# Patient Record
Sex: Male | Born: 1940 | Race: White | Hispanic: No | Marital: Married | Smoking: Never smoker
Health system: Southern US, Community
[De-identification: ages and names within clinical notes are randomized; demographics above are authoritative.]

## PROBLEM LIST (undated history)

## (undated) DIAGNOSIS — Z86010 Personal history of colonic polyps: Secondary | ICD-10-CM

## (undated) DIAGNOSIS — R030 Elevated blood-pressure reading, without diagnosis of hypertension: Secondary | ICD-10-CM

## (undated) DIAGNOSIS — R7302 Impaired glucose tolerance (oral): Secondary | ICD-10-CM

## (undated) DIAGNOSIS — F528 Other sexual dysfunction not due to a substance or known physiological condition: Secondary | ICD-10-CM

## (undated) DIAGNOSIS — Z85828 Personal history of other malignant neoplasm of skin: Secondary | ICD-10-CM

## (undated) DIAGNOSIS — Z87898 Personal history of other specified conditions: Secondary | ICD-10-CM

## (undated) DIAGNOSIS — E785 Hyperlipidemia, unspecified: Secondary | ICD-10-CM

## (undated) DIAGNOSIS — J309 Allergic rhinitis, unspecified: Secondary | ICD-10-CM

## (undated) DIAGNOSIS — N400 Enlarged prostate without lower urinary tract symptoms: Secondary | ICD-10-CM

## (undated) DIAGNOSIS — Z9889 Other specified postprocedural states: Secondary | ICD-10-CM

## (undated) DIAGNOSIS — N529 Male erectile dysfunction, unspecified: Secondary | ICD-10-CM

## (undated) DIAGNOSIS — K5730 Diverticulosis of large intestine without perforation or abscess without bleeding: Secondary | ICD-10-CM

## (undated) DIAGNOSIS — M1990 Unspecified osteoarthritis, unspecified site: Secondary | ICD-10-CM

## (undated) DIAGNOSIS — T7840XA Allergy, unspecified, initial encounter: Secondary | ICD-10-CM

## (undated) HISTORY — DX: Personal history of colonic polyps: Z86.010

## (undated) HISTORY — PX: POLYPECTOMY: SHX149

## (undated) HISTORY — PX: OTHER SURGICAL HISTORY: SHX169

## (undated) HISTORY — DX: Impaired glucose tolerance (oral): R73.02

## (undated) HISTORY — PX: BASAL CELL CARCINOMA EXCISION: SHX1214

## (undated) HISTORY — PX: COLONOSCOPY: SHX174

## (undated) HISTORY — DX: Allergic rhinitis, unspecified: J30.9

## (undated) HISTORY — PX: LUMBAR LAMINECTOMY: SHX95

## (undated) HISTORY — PX: SPINE SURGERY: SHX786

## (undated) HISTORY — DX: Hyperlipidemia, unspecified: E78.5

## (undated) HISTORY — PX: HERNIA REPAIR: SHX51

## (undated) HISTORY — DX: Personal history of other specified conditions: Z87.898

## (undated) HISTORY — DX: Allergy, unspecified, initial encounter: T78.40XA

## (undated) HISTORY — PX: BACK SURGERY: SHX140

## (undated) HISTORY — PX: INGUINAL HERNIA REPAIR: SUR1180

## (undated) HISTORY — DX: Other sexual dysfunction not due to a substance or known physiological condition: F52.8

## (undated) HISTORY — DX: Unspecified osteoarthritis, unspecified site: M19.90

---

## 1972-09-21 HISTORY — PX: LUMBAR LAMINECTOMY: SHX95

## 2001-09-21 LAB — HM COLONOSCOPY

## 2004-08-06 ENCOUNTER — Ambulatory Visit: Payer: Self-pay | Admitting: Family Medicine

## 2004-08-12 ENCOUNTER — Ambulatory Visit: Payer: Self-pay | Admitting: Family Medicine

## 2004-08-23 ENCOUNTER — Ambulatory Visit: Payer: Self-pay | Admitting: Family Medicine

## 2005-10-28 ENCOUNTER — Ambulatory Visit: Payer: Self-pay | Admitting: Family Medicine

## 2005-11-05 ENCOUNTER — Ambulatory Visit: Payer: Self-pay | Admitting: Family Medicine

## 2007-01-12 ENCOUNTER — Ambulatory Visit: Payer: Self-pay | Admitting: Family Medicine

## 2007-01-12 LAB — CONVERTED CEMR LAB
ALT: 37 units/L (ref 0–40)
AST: 29 units/L (ref 0–37)
Albumin: 4.1 g/dL (ref 3.5–5.2)
Alkaline Phosphatase: 50 units/L (ref 39–117)
BUN: 10 mg/dL (ref 6–23)
Basophils Absolute: 0 10*3/uL (ref 0.0–0.1)
Basophils Relative: 0.4 % (ref 0.0–1.0)
Bilirubin, Direct: 0.1 mg/dL (ref 0.0–0.3)
CO2: 32 meq/L (ref 19–32)
Calcium: 9.5 mg/dL (ref 8.4–10.5)
Chloride: 106 meq/L (ref 96–112)
Cholesterol: 201 mg/dL (ref 0–200)
Creatinine, Ser: 1 mg/dL (ref 0.4–1.5)
Direct LDL: 117 mg/dL
Eosinophils Absolute: 0.1 10*3/uL (ref 0.0–0.6)
Eosinophils Relative: 2.2 % (ref 0.0–5.0)
GFR calc Af Amer: 97 mL/min
GFR calc non Af Amer: 80 mL/min
Glucose, Bld: 101 mg/dL — ABNORMAL HIGH (ref 70–99)
HCT: 50.3 % (ref 39.0–52.0)
HDL: 43.2 mg/dL (ref >39.0)
Hemoglobin: 16.6 g/dL (ref 13.0–17.0)
Lymphocytes Relative: 31.8 % (ref 12.0–46.0)
MCHC: 33.1 g/dL (ref 30.0–36.0)
MCV: 89.1 fL (ref 78.0–100.0)
Monocytes Absolute: 0.6 10*3/uL (ref 0.2–0.7)
Monocytes Relative: 9.8 % (ref 3.0–11.0)
Neutro Abs: 3.4 10*3/uL (ref 1.4–7.7)
Neutrophils Relative %: 55.8 % (ref 43.0–77.0)
PSA: 1.07 ng/mL (ref 0.10–4.00)
Platelets: 214 10*3/uL (ref 150–400)
Potassium: 4.7 meq/L (ref 3.5–5.1)
RBC: 5.65 M/uL (ref 4.22–5.81)
RDW: 12 % (ref 11.5–14.6)
Sodium: 143 meq/L (ref 135–145)
TSH: 1.21 microintl units/mL (ref 0.35–5.50)
Total Bilirubin: 1.3 mg/dL — ABNORMAL HIGH (ref 0.3–1.2)
Total CHOL/HDL Ratio: 4.7
Total Protein: 6.8 g/dL (ref 6.0–8.3)
Triglycerides: 169 mg/dL — ABNORMAL HIGH (ref 0–149)
VLDL: 34 mg/dL (ref 0–40)
WBC: 6 10*3/uL (ref 4.5–10.5)

## 2007-01-19 ENCOUNTER — Ambulatory Visit: Payer: Self-pay | Admitting: Family Medicine

## 2007-05-31 ENCOUNTER — Ambulatory Visit: Payer: Self-pay | Admitting: Family Medicine

## 2008-01-19 ENCOUNTER — Ambulatory Visit: Payer: Self-pay | Admitting: Family Medicine

## 2008-01-19 LAB — CONVERTED CEMR LAB
Bilirubin Urine: NEGATIVE
Blood in Urine, dipstick: NEGATIVE
Glucose, Urine, Semiquant: NEGATIVE
Ketones, urine, test strip: NEGATIVE
Nitrite: NEGATIVE
Protein, U semiquant: NEGATIVE
Specific Gravity, Urine: 1.015
Urobilinogen, UA: 0.2
WBC Urine, dipstick: NEGATIVE
pH: 5.5

## 2008-01-26 ENCOUNTER — Ambulatory Visit: Payer: Self-pay | Admitting: Family Medicine

## 2008-01-26 DIAGNOSIS — N529 Male erectile dysfunction, unspecified: Secondary | ICD-10-CM | POA: Insufficient documentation

## 2008-01-26 DIAGNOSIS — F528 Other sexual dysfunction not due to a substance or known physiological condition: Secondary | ICD-10-CM | POA: Insufficient documentation

## 2008-01-26 LAB — CONVERTED CEMR LAB
ALT: 35 units/L (ref 0–53)
AST: 31 units/L (ref 0–37)
Albumin: 4.2 g/dL (ref 3.5–5.2)
Alkaline Phosphatase: 56 units/L (ref 39–117)
BUN: 13 mg/dL (ref 6–23)
Basophils Absolute: 0 10*3/uL (ref 0.0–0.1)
Basophils Relative: 0.2 % (ref 0.0–1.0)
Bilirubin, Direct: 0.1 mg/dL (ref 0.0–0.3)
CO2: 31 meq/L (ref 19–32)
Calcium: 9 mg/dL (ref 8.4–10.5)
Chloride: 101 meq/L (ref 96–112)
Cholesterol: 182 mg/dL (ref 0–200)
Creatinine, Ser: 1.2 mg/dL (ref 0.4–1.5)
Eosinophils Absolute: 0.2 10*3/uL (ref 0.0–0.7)
Eosinophils Relative: 2.7 % (ref 0.0–5.0)
GFR calc Af Amer: 78 mL/min
GFR calc non Af Amer: 65 mL/min
Glucose, Bld: 103 mg/dL — ABNORMAL HIGH (ref 70–99)
HCT: 49.5 % (ref 39.0–52.0)
HDL: 34.9 mg/dL — ABNORMAL LOW (ref >39.0)
Hemoglobin: 16.4 g/dL (ref 13.0–17.0)
LDL Cholesterol: 127 mg/dL — ABNORMAL HIGH (ref 0–99)
Lymphocytes Relative: 36.4 % (ref 12.0–46.0)
MCHC: 33.2 g/dL (ref 30.0–36.0)
MCV: 89.8 fL (ref 78.0–100.0)
Monocytes Absolute: 0.7 10*3/uL (ref 0.1–1.0)
Monocytes Relative: 11.7 % (ref 3.0–12.0)
Neutro Abs: 3.2 10*3/uL (ref 1.4–7.7)
Neutrophils Relative %: 49 % (ref 43.0–77.0)
PSA: 1.04 ng/mL (ref 0.10–4.00)
Platelets: 194 10*3/uL (ref 150–400)
Potassium: 4 meq/L (ref 3.5–5.1)
RBC: 5.52 M/uL (ref 4.22–5.81)
RDW: 11.9 % (ref 11.5–14.6)
Sodium: 138 meq/L (ref 135–145)
TSH: 1.71 microintl units/mL (ref 0.35–5.50)
Total Bilirubin: 1.3 mg/dL — ABNORMAL HIGH (ref 0.3–1.2)
Total CHOL/HDL Ratio: 5.2
Total Protein: 7 g/dL (ref 6.0–8.3)
Triglycerides: 100 mg/dL (ref 0–149)
VLDL: 20 mg/dL (ref 0–40)
WBC: 6.4 10*3/uL (ref 4.5–10.5)

## 2008-01-26 LAB — HM COLONOSCOPY

## 2008-10-15 ENCOUNTER — Ambulatory Visit: Payer: Self-pay | Admitting: Family Medicine

## 2008-10-15 DIAGNOSIS — K4090 Unilateral inguinal hernia, without obstruction or gangrene, not specified as recurrent: Secondary | ICD-10-CM | POA: Insufficient documentation

## 2008-10-30 ENCOUNTER — Encounter: Payer: Self-pay | Admitting: Family Medicine

## 2008-11-19 ENCOUNTER — Encounter: Admission: RE | Admit: 2008-11-19 | Discharge: 2008-11-19 | Payer: Self-pay | Admitting: General Surgery

## 2008-11-21 ENCOUNTER — Ambulatory Visit (HOSPITAL_BASED_OUTPATIENT_CLINIC_OR_DEPARTMENT_OTHER): Admission: RE | Admit: 2008-11-21 | Discharge: 2008-11-21 | Payer: Self-pay | Admitting: General Surgery

## 2008-12-04 ENCOUNTER — Encounter: Payer: Self-pay | Admitting: Family Medicine

## 2009-01-23 ENCOUNTER — Ambulatory Visit: Payer: Self-pay | Admitting: Family Medicine

## 2009-01-23 LAB — CONVERTED CEMR LAB
Blood in Urine, dipstick: NEGATIVE
Glucose, Urine, Semiquant: NEGATIVE
Ketones, urine, test strip: NEGATIVE
Nitrite: NEGATIVE
Specific Gravity, Urine: >=1.03
Urobilinogen, UA: 0.2
WBC Urine, dipstick: NEGATIVE
pH: 5

## 2009-01-29 ENCOUNTER — Ambulatory Visit: Payer: Self-pay | Admitting: Family Medicine

## 2009-01-29 LAB — CONVERTED CEMR LAB
ALT: 29 units/L (ref 0–53)
AST: 24 units/L (ref 0–37)
Albumin: 4 g/dL (ref 3.5–5.2)
Alkaline Phosphatase: 61 units/L (ref 39–117)
BUN: 15 mg/dL (ref 6–23)
Basophils Absolute: 0 10*3/uL (ref 0.0–0.1)
Basophils Relative: 0 % (ref 0.0–3.0)
Bilirubin, Direct: 0.1 mg/dL (ref 0.0–0.3)
CO2: 28 meq/L (ref 19–32)
Calcium: 9 mg/dL (ref 8.4–10.5)
Chloride: 111 meq/L (ref 96–112)
Cholesterol: 164 mg/dL (ref 0–200)
Creatinine, Ser: 1.1 mg/dL (ref 0.4–1.5)
Eosinophils Absolute: 0.1 10*3/uL (ref 0.0–0.7)
Eosinophils Relative: 1.6 % (ref 0.0–5.0)
GFR calc non Af Amer: 71.29 mL/min (ref >60)
Glucose, Bld: 97 mg/dL (ref 70–99)
HCT: 47.3 % (ref 39.0–52.0)
HDL: 39.7 mg/dL (ref >39.00)
Hemoglobin: 16.5 g/dL (ref 13.0–17.0)
LDL Cholesterol: 105 mg/dL — ABNORMAL HIGH (ref 0–99)
Lymphocytes Relative: 28.4 % (ref 12.0–46.0)
Lymphs Abs: 1.9 10*3/uL (ref 0.7–4.0)
MCHC: 34.8 g/dL (ref 30.0–36.0)
MCV: 89.4 fL (ref 78.0–100.0)
Monocytes Absolute: 0.7 10*3/uL (ref 0.1–1.0)
Monocytes Relative: 9.9 % (ref 3.0–12.0)
Neutro Abs: 4 10*3/uL (ref 1.4–7.7)
Neutrophils Relative %: 60.1 % (ref 43.0–77.0)
PSA: 1.15 ng/mL (ref 0.10–4.00)
Platelets: 160 10*3/uL (ref 150.0–400.0)
Potassium: 3.7 meq/L (ref 3.5–5.1)
RBC: 5.29 M/uL (ref 4.22–5.81)
RDW: 11.7 % (ref 11.5–14.6)
Sodium: 144 meq/L (ref 135–145)
TSH: 1.6 microintl units/mL (ref 0.35–5.50)
Total Bilirubin: 1.2 mg/dL (ref 0.3–1.2)
Total CHOL/HDL Ratio: 4
Total Protein: 6.9 g/dL (ref 6.0–8.3)
Triglycerides: 98 mg/dL (ref 0.0–149.0)
VLDL: 19.6 mg/dL (ref 0.0–40.0)
WBC: 6.7 10*3/uL (ref 4.5–10.5)

## 2009-08-27 ENCOUNTER — Encounter (INDEPENDENT_AMBULATORY_CARE_PROVIDER_SITE_OTHER): Payer: Self-pay | Admitting: *Deleted

## 2009-09-04 ENCOUNTER — Encounter: Payer: Self-pay | Admitting: Family Medicine

## 2010-01-30 ENCOUNTER — Ambulatory Visit: Payer: Self-pay | Admitting: Family Medicine

## 2010-01-30 DIAGNOSIS — E785 Hyperlipidemia, unspecified: Secondary | ICD-10-CM | POA: Insufficient documentation

## 2010-01-30 DIAGNOSIS — E039 Hypothyroidism, unspecified: Secondary | ICD-10-CM | POA: Insufficient documentation

## 2010-01-30 DIAGNOSIS — Z87898 Personal history of other specified conditions: Secondary | ICD-10-CM | POA: Insufficient documentation

## 2010-01-30 DIAGNOSIS — T50995A Adverse effect of other drugs, medicaments and biological substances, initial encounter: Secondary | ICD-10-CM | POA: Insufficient documentation

## 2010-01-30 DIAGNOSIS — R350 Frequency of micturition: Secondary | ICD-10-CM | POA: Insufficient documentation

## 2010-01-30 DIAGNOSIS — E559 Vitamin D deficiency, unspecified: Secondary | ICD-10-CM | POA: Insufficient documentation

## 2010-01-30 DIAGNOSIS — D649 Anemia, unspecified: Secondary | ICD-10-CM | POA: Insufficient documentation

## 2010-01-30 DIAGNOSIS — D239 Other benign neoplasm of skin, unspecified: Secondary | ICD-10-CM | POA: Insufficient documentation

## 2010-01-30 LAB — CONVERTED CEMR LAB
Bilirubin Urine: NEGATIVE
Blood in Urine, dipstick: NEGATIVE
Glucose, Urine, Semiquant: NEGATIVE
Ketones, urine, test strip: NEGATIVE
Nitrite: NEGATIVE
Protein, U semiquant: NEGATIVE
Specific Gravity, Urine: 1.025
Urobilinogen, UA: 0.2
WBC Urine, dipstick: NEGATIVE
pH: 5

## 2010-02-14 LAB — CONVERTED CEMR LAB
ALT: 39 units/L (ref 0–53)
AST: 29 units/L (ref 0–37)
Albumin: 4.2 g/dL (ref 3.5–5.2)
Alkaline Phosphatase: 56 units/L (ref 39–117)
BUN: 11 mg/dL (ref 6–23)
Basophils Absolute: 0 10*3/uL (ref 0.0–0.1)
Basophils Relative: 0.3 % (ref 0.0–3.0)
Bilirubin, Direct: 0.2 mg/dL (ref 0.0–0.3)
CO2: 30 meq/L (ref 19–32)
Calcium: 9.2 mg/dL (ref 8.4–10.5)
Chloride: 105 meq/L (ref 96–112)
Cholesterol: 161 mg/dL (ref 0–200)
Creatinine, Ser: 1.1 mg/dL (ref 0.4–1.5)
Eosinophils Absolute: 0.1 10*3/uL (ref 0.0–0.7)
Eosinophils Relative: 2.1 % (ref 0.0–5.0)
GFR calc non Af Amer: 73.37 mL/min (ref >60)
Glucose, Bld: 95 mg/dL (ref 70–99)
HCT: 45.7 % (ref 39.0–52.0)
HDL: 44.6 mg/dL (ref >39.00)
Hemoglobin: 16 g/dL (ref 13.0–17.0)
LDL Cholesterol: 95 mg/dL (ref 0–99)
Lymphocytes Relative: 27.6 % (ref 12.0–46.0)
Lymphs Abs: 1.5 10*3/uL (ref 0.7–4.0)
MCHC: 35 g/dL (ref 30.0–36.0)
MCV: 88.8 fL (ref 78.0–100.0)
Monocytes Absolute: 0.6 10*3/uL (ref 0.1–1.0)
Monocytes Relative: 10.8 % (ref 3.0–12.0)
Neutro Abs: 3.3 10*3/uL (ref 1.4–7.7)
Neutrophils Relative %: 59.2 % (ref 43.0–77.0)
PSA: 1.02 ng/mL (ref 0.10–4.00)
Platelets: 182 10*3/uL (ref 150.0–400.0)
Potassium: 4.7 meq/L (ref 3.5–5.1)
RBC: 5.14 M/uL (ref 4.22–5.81)
RDW: 13 % (ref 11.5–14.6)
Sodium: 142 meq/L (ref 135–145)
TSH: 1.28 microintl units/mL (ref 0.35–5.50)
Total Bilirubin: 0.8 mg/dL (ref 0.3–1.2)
Total CHOL/HDL Ratio: 4
Total Protein: 6.7 g/dL (ref 6.0–8.3)
Triglycerides: 105 mg/dL (ref 0.0–149.0)
VLDL: 21 mg/dL (ref 0.0–40.0)
Vit D, 25-Hydroxy: 34 ng/mL (ref 30–89)
WBC: 5.6 10*3/uL (ref 4.5–10.5)

## 2010-02-18 ENCOUNTER — Encounter: Payer: Self-pay | Admitting: Family Medicine

## 2010-10-21 NOTE — Assessment & Plan Note (Signed)
Summary: cpx/jls   Vital Signs:  Patient Profile:   70 Years Old Male Weight:      201 pounds Temp:     98 degrees F Pulse rate:   60 / minute BP sitting:   120 / 82  (left arm) Cuff size:   regular  Vitals Entered By: Pura Spice, RN (Jan 26, 2008 10:51 AM)                 Chief Complaint:  yrly ck up  .  History of Present Illness: Pt in for yearly physical examination Continues to need viagra no other complaints colonoscopic exam due 2013     Current Allergies (reviewed today): ! PCN    Risk Factors:  Colonoscopy History:     Date of Last Colonoscopy:  05/16/2002    Results:  Normal   Colonoscopy History:     Date of Last Colonoscopy:  05/16/2002    Results:  normal    Review of Systems      See HPI  General      Denies chills, fatigue, fever, loss of appetite, malaise, sleep disorder, sweats, weakness, and weight loss.  Eyes      Denies blurring, discharge, double vision, eye irritation, eye pain, halos, itching, light sensitivity, red eye, vision loss-1 eye, and vision loss-both eyes.  ENT      Denies decreased hearing, difficulty swallowing, ear discharge, earache, hoarseness, nasal congestion, nosebleeds, postnasal drainage, ringing in ears, sinus pressure, and sore throat.  CV      Denies bluish discoloration of lips or nails, chest pain or discomfort, difficulty breathing at night, difficulty breathing while lying down, fainting, fatigue, leg cramps with exertion, lightheadness, near fainting, palpitations, shortness of breath with exertion, swelling of feet, swelling of hands, and weight gain.  Resp      Denies chest discomfort, chest pain with inspiration, cough, coughing up blood, excessive snoring, hypersomnolence, morning headaches, pleuritic, shortness of breath, sputum productive, and wheezing.  GI      Denies abdominal pain, bloody stools, change in bowel habits, constipation, dark tarry stools, diarrhea, excessive appetite, gas,  hemorrhoids, indigestion, loss of appetite, nausea, vomiting, vomiting blood, and yellowish skin color.  GU      Complains of erectile dysfunction.      Denies decreased libido, discharge, dysuria, genital sores, hematuria, incontinence, nocturia, urinary frequency, and urinary hesitancy.  MS      Denies joint pain, joint redness, joint swelling, loss of strength, low back pain, mid back pain, muscle aches, muscle , cramps, muscle weakness, stiffness, and thoracic pain.  Derm      Denies changes in color of skin, changes in nail beds, dryness, excessive perspiration, flushing, hair loss, insect bite(s), itching, lesion(s), poor wound healing, and rash.  Neuro      Denies brief paralysis, difficulty with concentration, disturbances in coordination, falling down, headaches, inability to speak, memory loss, numbness, poor balance, seizures, sensation of room spinning, tingling, tremors, visual disturbances, and weakness.  Psych      Denies alternate hallucination ( auditory/visual), anxiety, depression, easily angered, easily tearful, irritability, mental problems, panic attacks, sense of great danger, suicidal thoughts/plans, thoughts of violence, unusual visions or sounds, and thoughts /plans of harming others.  Endo      Denies cold intolerance, excessive hunger, excessive thirst, excessive urination, heat intolerance, polyuria, and weight change.  Heme      Denies abnormal bruising, bleeding, enlarge lymph nodes, fevers, pallor, and skin discoloration.  Allergy  Denies hives or rash, itching eyes, persistent infections, seasonal allergies, and sneezing.  ENT      Denies decreased hearing, difficulty swallowing, ear discharge, earache, hoarseness, nasal congestion, nosebleeds, postnasal drainage, ringing in ears, sinus pressure, and sore throat.   Physical Exam  General:     Well-developed,well-nourished,in no acute distress; alert,appropriate and cooperative throughout  examination Head:     Normocephalic and atraumatic without obvious abnormalities. No apparent alopecia or balding. Eyes:     No corneal or conjunctival inflammation noted. EOMI. Perrla. Funduscopic exam benign, without hemorrhages, exudates or papilledema. Vision grossly normal. Ears:     External ear exam shows no significant lesions or deformities.  Otoscopic examination reveals clear canals, tympanic membranes are intact bilaterally without bulging, retraction, inflammation or discharge. Hearing is grossly normal bilaterally. Nose:     External nasal examination shows no deformity or inflammation. Nasal mucosa are pink and moist without lesions or exudates. Mouth:     Oral mucosa and oropharynx without lesions or exudates.  Teeth in good repair. Neck:     No deformities, masses, or tenderness noted. Chest Wall:     No deformities, masses, tenderness or gynecomastia noted. Breasts:     No masses or gynecomastia noted Lungs:     Normal respiratory effort, chest expands symmetrically. Lungs are clear to auscultation, no crackles or wheezes. Heart:     Normal rate and regular rhythm. S1 and S2 normal without gallop, murmur, click, rub or other extra sounds. EKG normal Abdomen:     Bowel sounds positive,abdomen soft and non-tender without masses, organomegaly or hernias noted. Rectal:     No external abnormalities noted. Normal sphincter tone. No rectal masses or tenderness. Genitalia:     Testes bilaterally descended without nodularity, tenderness or masses. No scrotal masses or lesions. No penis lesions or urethral discharge. Prostate:     Prostate gland firm and smooth, no enlargement, nodularity, tenderness, mass, asymmetry or induration. Msk:     No deformity or scoliosis noted of thoracic or lumbar spine.   Pulses:     R and L carotid,radial,femoral,dorsalis pedis and posterior tibial pulses are full and equal bilaterally Extremities:     No clubbing, cyanosis, edema, or  deformity noted with normal full range of motion of all joints.   Neurologic:     No cranial nerve deficits noted. Station and gait are normal. Plantar reflexes are down-going bilaterally. DTRs are symmetrical throughout. Sensory, motor and coordinative functions appear intact. Skin:      1 small nevus anterior chest, appears nnon malignant , smaller than size of an eraser Cervical Nodes:     No lymphadenopathy noted Axillary Nodes:     No palpable lymphadenopathy Inguinal Nodes:     No significant adenopathy Psych:     Cognition and judgment appear intact. Alert and cooperative with normal attention span and concentration. No apparent delusions, illusions, hallucinations    Impression & Recommendations:  Problem # 1:  PHYSICAL EXAMINATION (ICD-V70.0)  Orders: EKG w/ Interpretation (93000)   Complete Medication List: 1)  Adult Aspirin Low Strength 81 Mg Tbdp (Aspirin) 2)  Viagra 100 Mg Tabs (Sildenafil citrate) .Marland Kitchen.. 1 tab as instructed 3)  Ketoconazole 2 % Crea (Ketoconazole) .... Apply three times a day as needed rash  Colorectal Screening:  Colonoscopy Results:    Date of Exam: 05/16/2002    Results: Normal  Next Colonoscopy Due:    05/16/2012  PSA Screening:    PSA: 1.07  (01/12/2007)  Immunization & Chemoprophylaxis:  Tetanus vaccine: Historical  (12/21/2006)   Patient Instructions: 1)  doing fine normal examination, EKG normal 2)  continue viagra as needed   Prescriptions: VIAGRA 100 MG  TABS (SILDENAFIL CITRATE) 1 tab as instructed  #6 x 11   Entered and Authorized by:   Judithann Sheen MD   Signed by:   Judithann Sheen MD on 01/26/2008   Method used:   Electronically sent to ...       [facility]       [address]       [place], [place]  [address]       Ph: [phone]       Fax: [phone]   RxID:   [id number]  ]    Preventive Care Screening  Colonoscopy:    Date:  05/16/2002     Results:  normal

## 2010-10-21 NOTE — Assessment & Plan Note (Signed)
Summary: RASH/OK PER DR/CCM   Vital Signs:  Patient Profile:   70 Years Old Male Weight:      196 pounds (89.09 kg) Temp:     98.5 degrees F (36.94 degrees C) oral Pulse rate:   76 / minute Pulse rhythm:   regular BP sitting:   104 / 70  (left arm) Cuff size:   regular  Vitals Entered By: Alfred Levins, CMA (May 31, 2007 3:55 PM)                 Chief Complaint:  rash around neck.  History of Present Illness: 6 months of itchy rash around his neck. This summer has gotten worse and made his skin color splotchy.  Current Allergies: ! PCN      Physical Exam  General:     Well-developed,well-nourished,in no acute distress; alert,appropriate and cooperative throughout examination Skin:     his neck and upper chest and back have macular patches of hypopigmented skin    Impression & Recommendations:  Problem # 1:  TINEA VERSICOLOR (ICD-111.0)  His updated medication list for this problem includes:    Ketoconazole 2 % Crea (Ketoconazole) .Marland Kitchen... Apply three times a day as needed rash   Complete Medication List: 1)  Adult Aspirin Low Strength 81 Mg Tbdp (Aspirin) 2)  Viagra 100 Mg Tabs (Sildenafil citrate) 3)  Ketoconazole 2 % Crea (Ketoconazole) .... Apply three times a day as needed rash   Patient Instructions: 1)  Please schedule a follow-up appointment as needed.    Prescriptions: KETOCONAZOLE 2 %  CREA (KETOCONAZOLE) apply three times a day as needed rash  #60 grams x 5   Entered and Authorized by:   Nelwyn Salisbury MD   Signed by:   Nelwyn Salisbury MD on 05/31/2007   Method used:   Electronically sent to ...       [facility]       [address]       [place], [place]  [address]       Ph: [phone]       Fax: [phone]   RxID:   [id number]  ]

## 2010-10-21 NOTE — Assessment & Plan Note (Signed)
Summary: pt will come in fasting/njr   Vital Signs:  Patient profile:   70 year old male Height:      70 inches Weight:      204 pounds BMI:     29.38 O2 Sat:      96 % Temp:     97.6 degrees F Pulse rate:   74 / minute BP sitting:   124 / 80  (left arm)  Vitals Entered By: Pura Spice, RN (Jan 30, 2010 8:28 AM) CC: refills go over problems fasting    History of Present Illness: This 70 year old white married male is in to discuss his medical problems and refill on necessary medications He continues to have problem with erectile dysfunction total increase from 50-100 mg tablets Since last years has some urinary frequency no dysuria and only nocturia 1-2 times He desired me to check a lesion on his left ankle  the fact that he needs a colonoscopic examination in 2012 no problems since his hernia was repaired had a lumbar laminectomy in the past and had no problem since  Allergies: 1)  ! Pcn  Past History:  Past Medical History: Last updated: 05/31/2007 Unremarkable  Social History: Last updated: 05/31/2007 Retired Married Never Smoked  Risk Factors: Smoking Status: never (05/31/2007)  Past Surgical History: Back Surgery Colonoscopy herniorrhaphy right  Review of Systems      See HPI  The patient denies anorexia, fever, weight loss, weight gain, vision loss, decreased hearing, hoarseness, chest pain, syncope, dyspnea on exertion, peripheral edema, prolonged cough, headaches, hemoptysis, abdominal pain, melena, hematochezia, severe indigestion/heartburn, hematuria, incontinence, genital sores, muscle weakness, suspicious skin lesions, transient blindness, difficulty walking, depression, unusual weight change, abnormal bleeding, enlarged lymph nodes, angioedema, breast masses, and testicular masses.    Physical Exam  General:  Well-developed,well-nourished,in no acute distress; alert,appropriate and cooperative throughout examination Eyes:  No corneal or  conjunctival inflammation noted. EOMI. Perrla. Funduscopic exam benign, without hemorrhages, exudates or papilledema. Vision grossly normal. Ears:  External ear exam shows no significant lesions or deformities.  Otoscopic examination reveals clear canals, tympanic membranes are intact bilaterally without bulging, retraction, inflammation or discharge. Hearing is grossly normal bilaterally. Nose:  External nasal examination shows no deformity or inflammation. Nasal mucosa are pink and moist without lesions or exudates. Mouth:  Oral mucosa and oropharynx without lesions or exudates.  Teeth in good repair. Neck:  No deformities, masses, or tenderness noted. Chest Wall:  No deformities, masses, tenderness or gynecomastia noted. Breasts:  No masses or gynecomastia noted Lungs:  Normal respiratory effort, chest expands symmetrically. Lungs are clear to auscultation, no crackles or wheezes. Heart:  Normal rate and regular rhythm. S1 and S2 normal without gallop, murmur, click, rub or other extra sounds. Abdomen:  Bowel sounds positive,abdomen soft and non-tender without masses, organomegaly or hernias noted. Rectal:  No external abnormalities noted. Normal sphincter tone. No rectal masses or tenderness. Genitalia:  Testes bilaterally descended without nodularity, tenderness or masses. No scrotal masses or lesions. No penis lesions or urethral discharge. Prostate:  no nodules, no asymmetry, no induration, and 1+ enlarged.   Msk:  No deformity or scoliosis noted of thoracic or lumbar spine.   Pulses:  R and L carotid,radial,femoral,dorsalis pedis and posterior tibial pulses are full and equal bilaterally Extremities:  No clubbing, cyanosis, edema, or deformity noted with normal full range of motion of all joints.   Neurologic:  No cranial nerve deficits noted. Station and gait are normal. Plantar reflexes are down-going bilaterally.  DTRs are symmetrical throughout. Sensory, motor and coordinative functions  appear intact. Skin:  nevus left ankle Cervical Nodes:  No lymphadenopathy noted Axillary Nodes:  No palpable lymphadenopathy Inguinal Nodes:  No significant adenopathy Psych:  Cognition and judgment appear intact. Alert and cooperative with normal attention span and concentration. No apparent delusions, illusions, hallucinations   Impression & Recommendations:  Problem # 1:  BENIGN PROSTATIC HYPERTROPHY, MILD, HX OF (ICD-V13.8) Assessment New  Problem # 2:  NEVUS (ICD-216.9) Assessment: New  Orders: Dermatology Referral (Derma)  Problem # 3:  URINARY FREQUENCY (ICD-788.41) Assessment: New  Orders: UA Dipstick w/o Micro (automated)  (81003)urinalysis negative symptoms of mild BPH  Problem # 4:  INGUINAL HERNIA (ICD-550.90) Assessment: Improved  Problem # 5:  ERECTILE DYSFUNCTION (ICD-302.72) Assessment: Unchanged  His updated medication list for this problem includes:    Viagra 100 Mg Tabs (Sildenafil citrate) .Marland Kitchen... 1 tab as instructed  Orders: Prescription Created Electronically [phone])  Complete Medication List: 1)  Viagra 100 Mg Tabs (Sildenafil citrate) .Marland Kitchen.. 1 tab as instructed 2)  Aspir-low 81 Mg Tbec (Aspirin) .Marland Kitchen.. 1 qd  Other Orders: Pneumococcal Vaccine (60454) Admin 1st Vaccine (09811) Venipuncture (91478) TLB-Lipid Panel (80061-LIPID) TLB-BMP (Basic Metabolic Panel-BMET) (80048-METABOL) TLB-CBC Platelet - w/Differential (85025-CBCD) TLB-Hepatic/Liver Function Pnl (80076-HEPATIC) TLB-TSH (Thyroid Stimulating Hormone) (84443-TSH) TLB-PSA (Prostate Specific Antigen) (84153-PSA) T-Vitamin D (25-Hydroxy) (29562-13086)  Patient Instructions: 1)  in general physical examination is very good 2)  We'll call results of lab studies 3)  Continue Viagra 100 mg for erectile dysfunction 4)  Referral to Dr. reluctant for evaluation and treatment of the lesion on the left ankle Prescriptions: VIAGRA 100 MG  TABS (SILDENAFIL CITRATE) 1 tab as instructed  #6 x 11    Entered and Authorized by:   Judithann Sheen MD   Signed by:   Judithann Sheen MD on 01/30/2010   Method used:   Electronically to        [facility] [address] (retail)       [address]       [place], [place]  [address]       Ph: [phone]       Fax: [phone]   RxID:   [id number]    Immunizations Administered:  Pneumonia Vaccine:    Vaccine Type: Pneumovax    Site: left deltoid    Mfr: Merck    Dose: 0.5 ml    Route: IM    Given by: Pura Spice, RN    Exp. Date: 04/26/2011    Lot #: [id number]      Laboratory Results   Urine Tests    Routine Urinalysis   Color: yellow Appearance: Clear Glucose: negative   (Normal Range: Negative) Bilirubin: negative   (Normal Range: Negative) Ketone: negative   (Normal Range: Negative) Spec. Gravity: 1.025   (Normal Range: 1.003-1.035) Blood: negative   (Normal Range: Negative) pH: 5.0   (Normal Range: 5.0-8.0) Protein: negative   (Normal Range: Negative) Urobilinogen: 0.2   (Normal Range: 0-1) Nitrite: negative   (Normal Range: Negative) Leukocyte Esterace: negative   (Normal Range: Negative)    Comments: Rita Ohara  Jan 30, 2010 10:17 AM

## 2010-10-21 NOTE — Miscellaneous (Signed)
Summary: ZOSTIVAX shingles vaccine  ZOSTIVAX shingles vaccine Provider: This provider was preselected by the workflow.  Signature: The signature status of this document was preset by the workflow  Processed by [employer] Local Indexer Client @ Tuesday, September 10, 2009 8:38:44 AM using version:2010.1.2.11(2.4)   Manually Indexed By: [id number]  idlBatchDetail: [id number]   _____________________________________________________________________  External Attachment:    Type:   Image     Comment:   External Document

## 2010-10-21 NOTE — Consult Note (Signed)
Summary: [facility]  [facility]   Imported By: Maryln Gottron 05/20/2010 12:35:31  _____________________________________________________________________  External Attachment:    Type:   Image     Comment:   External Document

## 2010-10-24 NOTE — Letter (Signed)
Summary: [facility]   Imported By: Maryln Gottron 11/15/2008 12:56:39  _____________________________________________________________________  External Attachment:    Type:   Image     Comment:   External Document

## 2011-01-01 LAB — CBC
HCT: 45.6 % (ref 39.0–52.0)
Hemoglobin: 16 g/dL (ref 13.0–17.0)
MCHC: 35 g/dL (ref 30.0–36.0)
MCV: 87.6 fL (ref 78.0–100.0)
Platelets: 183 10*3/uL (ref 150–400)
RBC: 5.21 MIL/uL (ref 4.22–5.81)
RDW: 12.8 % (ref 11.5–15.5)
WBC: 5.5 10*3/uL (ref 4.0–10.5)

## 2011-01-01 LAB — COMPREHENSIVE METABOLIC PANEL
ALT: 38 U/L (ref 0–53)
AST: 35 U/L (ref 0–37)
Albumin: 3.8 g/dL (ref 3.5–5.2)
Alkaline Phosphatase: 52 U/L (ref 39–117)
BUN: 11 mg/dL (ref 6–23)
CO2: 27 mEq/L (ref 19–32)
Calcium: 9 mg/dL (ref 8.4–10.5)
Chloride: 108 mEq/L (ref 96–112)
Creatinine, Ser: 1.1 mg/dL (ref 0.4–1.5)
GFR calc Af Amer: >60 mL/min (ref >60)
GFR calc non Af Amer: >60 mL/min (ref >60)
Glucose, Bld: 102 mg/dL — ABNORMAL HIGH (ref 70–99)
Potassium: 4.2 mEq/L (ref 3.5–5.1)
Sodium: 140 mEq/L (ref 135–145)
Total Bilirubin: 0.9 mg/dL (ref 0.3–1.2)
Total Protein: 6.5 g/dL (ref 6.0–8.3)

## 2011-01-01 LAB — DIFFERENTIAL
Basophils Absolute: 0 10*3/uL (ref 0.0–0.1)
Basophils Relative: 0 % (ref 0–1)
Eosinophils Absolute: 0.1 10*3/uL (ref 0.0–0.7)
Eosinophils Relative: 2 % (ref 0–5)
Lymphocytes Relative: 32 % (ref 12–46)
Lymphs Abs: 1.8 10*3/uL (ref 0.7–4.0)
Monocytes Absolute: 0.6 10*3/uL (ref 0.1–1.0)
Monocytes Relative: 11 % (ref 3–12)
Neutro Abs: 3 10*3/uL (ref 1.7–7.7)
Neutrophils Relative %: 55 % (ref 43–77)

## 2011-02-03 NOTE — Op Note (Signed)
Charles Lawson, Charles Lawson NO.:  [id number]   MEDICAL RECORD NO.:  [id number]          PATIENT TYPE:  AMB   LOCATION:  DSC                          FACILITY:  [facility]   PHYSICIAN:  Juanetta Gosling, MDDATE OF BIRTH:  [birth year]   DATE OF PROCEDURE:  11/21/2008  DATE OF DISCHARGE:                               OPERATIVE REPORT   PREOPERATIVE DIAGNOSIS:  Right inguinal hernia.   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   PROCEDURE:  Right inguinal repair with extended Prolene hernia system  mesh.   SURGEON:  Troy Sine. Dwain Sarna, MD   ASSISTANT:  None.   ANESTHESIA:  General.   FINDINGS:  Indirect right inguinal hernia with cord lipoma.   SPECIMENS:  None.   DRAINS:  None.   COMPLICATIONS:  None.   ESTIMATED BLOOD LOSS:  Minimal.   DISPOSITION:  To recovery room in stable condition.   INDICATIONS:  Charles Lawson is a 70 year old male with about 20-month history  of right groin pain as well as a bulge who describes no other  significant complaints.  Since this has begun, this is that increasingly  tender and difficult for him to perform his daily activities.   PHYSICAL EXAMINATION:  He had a reducible nontender right inguinal  hernia with no left inguinal hernia present.  I counseled him for an  open right inguinal hernia repair with mesh.   PROCEDURE:  After informed consent was obtained, the patient was taken  to the operating room.  He was administered 400 mg of IV for  ciprofloxacin due to his PENICILLIN allergy.  He was then placed under  general anesthesia without complications.  He had previously shaved his  groin, but otherwise he was prepped and draped in a standard sterile  surgical fashion.  Surgical time out was then performed.   A 4-cm right groin incision was made and dissection was carried out down  to the level of external abdominal oblique.  Superficial epigastric vein  was identified, this was ligated  with 2-0 Vicryl ties and divided.  The  external abdominal oblique was then identified and incised.  The  Metzenbaum scissors were then used to extend this incision through the  external inguinal ring.  His cord was then encircled with a Penrose  drain.  He was noted to have no evidence of a direct hernia.  He did  have a fairly sizable cord lipoma, which was dissected free from the  surrounding cord structures. The vas deferens, testicular vessels, and  the remainder of the cord structures were identified and preserved  throughout the operation.  The cord lipoma was then tracked to the  internal ring.  This was then clamped , ligated witha 2-0 Vicryl tie and  divided. Marland Kitchen  He also had a fairly large hernia sac present.  This too was  freed from the surrounding cord structures and this was placed back in  the abdomen.  He had a fairly large indirect defect; and due to this, I  elected to place a Prolene hernia system.  The preperitoneal space was  developed with a sponge in the appropriate fashion and extended Prolene  hernia system was then inserted into this, the bottom portion of bilayer  was laid flat underneath the floor of his inguinal canal.  Following  this, the top portion of the bilayer was then laid flat.  This was then  sutured into position near the pubic tubercle superiorly to the internal  oblique into the inguinal ligament inferiorly, a T cut was made in the  mesh and wrapped around the spermatic cord.  The mesh was then tacked  together again as well as to the inguinal ligament in 2 positions and  then laid flat under the external abdominal oblique.  The mesh appeared  to lie flat upon completion of this and was in good position.  The  Penrose drain was then removed.  Hemostasis was observed.  The external  abdominal oblique was then closed with a 2-0 Vicryl suture.  The  Scarpa's fascia was closed with a 3-0 Vicryl suture.  The skin was then  closed with a 4-0 Monocryl  suture in a subcuticular fashion.  A 10 mL of  0.25% Marcaine was infiltrated into the wound as well as performing an  ilioinguinal nerve block.  Dermabond was then placed over the wound.  Testicle was present in his scrotum at the completion of the operation.  He tolerated this well, was extubated in the operating room and  transferred to recovery room in stable condition.      Juanetta Gosling, MD  Electronically Signed     MCW/MEDQ  D:  11/21/2008  T:  11/21/2008  Job:  [id number]   cc:   Tera Mater. Clent Ridges, MD

## 2011-02-26 ENCOUNTER — Other Ambulatory Visit (INDEPENDENT_AMBULATORY_CARE_PROVIDER_SITE_OTHER): Payer: Medicare Other

## 2011-02-26 DIAGNOSIS — Z0000 Encounter for general adult medical examination without abnormal findings: Secondary | ICD-10-CM

## 2011-02-26 DIAGNOSIS — Z0389 Encounter for observation for other suspected diseases and conditions ruled out: Secondary | ICD-10-CM

## 2011-02-26 LAB — BASIC METABOLIC PANEL
BUN: 16 mg/dL (ref 6–23)
CO2: 27 mEq/L (ref 19–32)
Calcium: 8.8 mg/dL (ref 8.4–10.5)
Chloride: 101 mEq/L (ref 96–112)
Creatinine, Ser: 1.2 mg/dL (ref 0.4–1.5)
GFR: 65.97 mL/min (ref >60.00)
Glucose, Bld: 95 mg/dL (ref 70–99)
Potassium: 3.9 mEq/L (ref 3.5–5.1)
Sodium: 140 mEq/L (ref 135–145)

## 2011-02-26 LAB — URINALYSIS
Bilirubin Urine: NEGATIVE
Hgb urine dipstick: NEGATIVE
Ketones, ur: NEGATIVE
Leukocytes, UA: NEGATIVE
Nitrite: NEGATIVE
Specific Gravity, Urine: >=1.03 (ref 1.000–1.030)
Total Protein, Urine: NEGATIVE
Urine Glucose: NEGATIVE
Urobilinogen, UA: 0.2 (ref 0.0–1.0)
pH: 5 (ref 5.0–8.0)

## 2011-02-26 LAB — CBC WITH DIFFERENTIAL/PLATELET
Basophils Absolute: 0 10*3/uL (ref 0.0–0.1)
Basophils Relative: 0.4 % (ref 0.0–3.0)
Eosinophils Absolute: 0.1 10*3/uL (ref 0.0–0.7)
Eosinophils Relative: 2.1 % (ref 0.0–5.0)
HCT: 47.8 % (ref 39.0–52.0)
Hemoglobin: 16.6 g/dL (ref 13.0–17.0)
Lymphocytes Relative: 36.7 % (ref 12.0–46.0)
Lymphs Abs: 2.6 10*3/uL (ref 0.7–4.0)
MCHC: 34.7 g/dL (ref 30.0–36.0)
MCV: 88.1 fl (ref 78.0–100.0)
Monocytes Absolute: 0.8 10*3/uL (ref 0.1–1.0)
Monocytes Relative: 11 % (ref 3.0–12.0)
Neutro Abs: 3.5 10*3/uL (ref 1.4–7.7)
Neutrophils Relative %: 49.8 % (ref 43.0–77.0)
Platelets: 185 10*3/uL (ref 150.0–400.0)
RBC: 5.43 Mil/uL (ref 4.22–5.81)
RDW: 12.6 % (ref 11.5–14.6)
WBC: 7 10*3/uL (ref 4.5–10.5)

## 2011-02-26 LAB — HEPATIC FUNCTION PANEL
ALT: 32 U/L (ref 0–53)
AST: 24 U/L (ref 0–37)
Albumin: 4.4 g/dL (ref 3.5–5.2)
Alkaline Phosphatase: 58 U/L (ref 39–117)
Bilirubin, Direct: 0.2 mg/dL (ref 0.0–0.3)
Total Bilirubin: 0.8 mg/dL (ref 0.3–1.2)
Total Protein: 7.3 g/dL (ref 6.0–8.3)

## 2011-02-26 LAB — LIPID PANEL
Cholesterol: 209 mg/dL — ABNORMAL HIGH (ref 0–200)
HDL: 47.5 mg/dL (ref >39.00)
Total CHOL/HDL Ratio: 4
Triglycerides: 111 mg/dL (ref 0.0–149.0)
VLDL: 22.2 mg/dL (ref 0.0–40.0)

## 2011-02-26 LAB — LDL CHOLESTEROL, DIRECT: Direct LDL: 138.7 mg/dL

## 2011-02-27 LAB — TSH: TSH: 1.45 u[IU]/mL (ref 0.35–5.50)

## 2011-02-27 LAB — PSA: PSA: 1.04 ng/mL (ref 0.10–4.00)

## 2011-03-02 ENCOUNTER — Encounter: Payer: Self-pay | Admitting: Family Medicine

## 2011-03-10 ENCOUNTER — Ambulatory Visit (INDEPENDENT_AMBULATORY_CARE_PROVIDER_SITE_OTHER): Payer: Medicare Other | Admitting: Family Medicine

## 2011-03-10 ENCOUNTER — Encounter: Payer: Self-pay | Admitting: Family Medicine

## 2011-03-10 VITALS — BP 110/80 | HR 74 | Temp 97.8°F | Ht 71.0 in | Wt 207.0 lb

## 2011-03-10 DIAGNOSIS — N529 Male erectile dysfunction, unspecified: Secondary | ICD-10-CM

## 2011-03-10 DIAGNOSIS — M25572 Pain in left ankle and joints of left foot: Secondary | ICD-10-CM

## 2011-03-10 DIAGNOSIS — M25579 Pain in unspecified ankle and joints of unspecified foot: Secondary | ICD-10-CM

## 2011-03-10 DIAGNOSIS — E785 Hyperlipidemia, unspecified: Secondary | ICD-10-CM

## 2011-03-10 MED ORDER — SILDENAFIL CITRATE 100 MG PO TABS: 100.0000 mg | ORAL_TABLET | ORAL | Status: DC | PRN

## 2011-03-10 NOTE — Patient Instructions (Addendum)
You are doing well except needs to cut down on high cholesterolfoods as hamburgers Other lab work was fine Will give you Viagra  Samples, will [facility] Recheck lipids in 3 months Get metarsal pad for left foot, Dr. Jari Sportsman display in phaarmacy

## 2011-03-10 NOTE — Progress Notes (Signed)
  Subjective:    Patient ID: Charles Lawson, male    DOB: 22-Jan-1941, 70 y.o.   MRN: [id number] This 70 year old retired white male is in today to discuss his medical problems it necessary lab studies and review his medication He relates he has been doing her well and enjoyed her, and has taken over the cooking  at home His primary complaint is that of pain over the second metatarsal joint of the left foot which causes some pain when he walkes He continues to benefit from Viagra for his erectile dysfunction and desires a prescription He has some arthritic pain in the morning and improves after he moves around immunizations of pneumonia tetanus and zostavax are up-to-date He has no other complaint S.  HPI    Review of Systems  Constitutional: Negative.   HENT: Negative.   Respiratory: Negative.   Cardiovascular: Negative.   Gastrointestinal: Negative.   Genitourinary: Negative.        Erectile dysfunction  Musculoskeletal: Positive for arthralgias.  Skin: Negative.   Neurological: Negative.   Hematological: Negative.   Psychiatric/Behavioral: Negative.        Objective:   Physical Exam the patient is a well-developed well-nourished white male who appears than his given age of 70. He is in no acute distress alert and cooperative HEENT no positive findings carotid pulses are good thyroid is nonpalpable Lungs clear to palpation percussion and auscultation no dullness no wheezing no rales Heart no evidence of cardiomegaly heart sounds are good without murmurs rhythm regular Abdomen liver spleen and kidneys are nonpalpable no masses no tenderness bowel sounds normal well-healed op scar in the right inguinal region from repair of inguinal hernia Genitalia normal Rectal examination small external hemorrhoids prostate normal no masses or nodules no pain or tenderness prostate firm Extremities examination of the left foot reveals tenderness over the second metatarsal joint No edema good  peripheral pulses Skin negative Spine well healed lumbar laminectomy scar Neurological negative        Assessment & Plan:  Erectile dysfunction to continue use of Viagra Arthralgia to treat with orthotic, a metatarsal pad Hyperlipidemia discussed diet and gave information regarding nutrition to include diet as well as exercise repeat lipid profile in 3 month Continue aspirin 81 mg q. day

## 2011-03-13 ENCOUNTER — Telehealth: Payer: Self-pay

## 2011-03-13 NOTE — Telephone Encounter (Signed)
Called to give pt lab results; left a message for pt to call back

## 2011-12-26 ENCOUNTER — Encounter: Payer: Self-pay | Admitting: Internal Medicine

## 2011-12-26 DIAGNOSIS — M1990 Unspecified osteoarthritis, unspecified site: Secondary | ICD-10-CM | POA: Insufficient documentation

## 2011-12-26 DIAGNOSIS — Z0000 Encounter for general adult medical examination without abnormal findings: Secondary | ICD-10-CM | POA: Insufficient documentation

## 2011-12-29 ENCOUNTER — Ambulatory Visit (INDEPENDENT_AMBULATORY_CARE_PROVIDER_SITE_OTHER): Payer: Medicare Other | Admitting: Internal Medicine

## 2011-12-29 ENCOUNTER — Encounter: Payer: Self-pay | Admitting: Internal Medicine

## 2011-12-29 VITALS — BP 112/70 | HR 70 | Temp 98.2°F | Ht 71.0 in | Wt 204.0 lb

## 2011-12-29 DIAGNOSIS — F528 Other sexual dysfunction not due to a substance or known physiological condition: Secondary | ICD-10-CM | POA: Diagnosis not present

## 2011-12-29 DIAGNOSIS — J309 Allergic rhinitis, unspecified: Secondary | ICD-10-CM

## 2011-12-29 DIAGNOSIS — E785 Hyperlipidemia, unspecified: Secondary | ICD-10-CM | POA: Diagnosis not present

## 2011-12-29 MED ORDER — SILDENAFIL CITRATE 100 MG PO TABS: 50.0000 mg | ORAL_TABLET | Freq: Every day | ORAL | Status: DC | PRN

## 2011-12-29 NOTE — Assessment & Plan Note (Signed)
Mild to mod, for viagra prn  to f/u any worsening symptoms or concerns

## 2011-12-29 NOTE — Assessment & Plan Note (Signed)
stable overall by hx and exam, most recent data reviewed with pt, and pt to continue medical treatment as before  Lab Results  Component Value Date   LDLCALC 95 01/30/2010   To follow lower chol diet, goal ldl < 100

## 2011-12-29 NOTE — Assessment & Plan Note (Signed)
Mild to mod, for allegra otc course,  to f/u any worsening symptoms or concerns  

## 2011-12-29 NOTE — Patient Instructions (Signed)
Continue all other medications as before No new medications at this time Please have the pharmacy call with any refills you may need. Please follow lower cholesterol diet Please use OTC Allegra as needed for allergies (or similar, such as zyrtec or claritin) Please return in 3 months, or sooner if needed

## 2011-12-29 NOTE — Progress Notes (Signed)
Subjective:    Patient ID: Charles Lawson, male    DOB: 05-20-1941, 71 y.o.   MRN: [id number]  HPI here to establish as new pt, in transfer from Dr Scotty Court now retired.  Pt denies chest pain, increased sob or doe, wheezing, orthopnea, PND, increased LE swelling, palpitations, dizziness or syncope.   Pt denies polydipsia, polyuria.  Pt denies new neurological symptoms such as new headache, or facial or extremity weakness or numbness  Has ongoing ED symtpoms, no worse, has viagra he uses infreqeuntly.  Does have sense of ongoing fatigue, but denies signficant hypersomnolence.  Denies worsening depressive symptoms, suicidal ideation, or panic, though has ongoing anxiety, not increased recently.   Normally has lab work done in summer. Does have several wks ongoing nasal allergy symptoms with clear congestion, itch and sneeze, without fever, pain, ST, cough or wheezing.  Has taken allergy shots in the past, wants to avoid rx meds at this time.  Admits to noncompliacne with low chol diet. Past Medical History  Diagnosis Date  . 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 Allergic rhinitis, cause unspecified   . Arthritis   . Hyperlipidemia   . HYPERLIPIDEMIA 01/30/2010    Qualifier: Diagnosis of  By: Rita Ohara     Past Surgical History  Procedure Date  . Back surgery   . Colonscopy   . Herniorrhaphy right   . Spine surgery     lumbar laminectomy  . Hernia repair   . Lumbar laminectomy   . Basal cell carcinoma excision     reports that he has never smoked. He has never used smokeless tobacco. He reports that he drinks alcohol. He reports that he does not use illicit drugs. family history includes COPD in his mother and Cancer in his father. Allergies  Allergen Reactions  . Penicillins    Current Outpatient Prescriptions on File  Prior to Visit  Medication Sig Dispense Refill  . aspirin 81 MG tablet Take 81 mg by mouth daily.           Review of Systems Review of Systems  Constitutional: Negative for diaphoresis and unexpected weight change.  HENT: Negative for drooling and tinnitus.   Eyes: Negative for photophobia and visual disturbance.  Respiratory: Negative for choking and stridor.   Gastrointestinal: Negative for vomiting and blood in stool.  Genitourinary: Negative for hematuria and decreased urine volume.  Musculoskeletal: Negative for gait problem.  Skin: Negative for color change and wound.  Neurological: Negative for tremors and numbness.  Psychiatric/Behavioral: Negative for decreased concentration. The patient is not hyperactive.       Objective:   Physical Exam BP 112/70  Pulse 70  Temp(Src) 98.2 F (36.8 C) (Oral)  Ht 5\' 11"  (1.803 m)  Wt 204 lb (92.534 kg)  BMI 28.45 kg/m2  SpO2 96% Physical Exam  VS noted Constitutional: Pt appears well-developed and well-nourished.  HENT: Head: Normocephalic.  Right Ear: External ear normal.  Left Ear: External ear normal.  Bilat tm's mild erythema.  Sinus nontender.  Pharynx mild erythema Eyes: Conjunctivae and EOM are normal. Pupils are equal, round, and reactive to light.  Neck: Normal range of motion. Neck supple.  Cardiovascular: Normal rate and regular rhythm.   Pulmonary/Chest: Effort normal and breath sounds normal.  Abd:  Soft, NT,  non-distended, + BS Neurological: Pt is alert. No cranial nerve deficit.  Skin: Skin is warm. No erythema.  Psychiatric: Pt behavior is normal. Thought content normal. somewhat nervous today, not depressed affect    Assessment & Plan:

## 2012-03-21 ENCOUNTER — Encounter: Payer: Self-pay | Admitting: Internal Medicine

## 2012-03-30 ENCOUNTER — Encounter: Payer: Self-pay | Admitting: Internal Medicine

## 2012-03-30 ENCOUNTER — Other Ambulatory Visit: Payer: Self-pay | Admitting: Internal Medicine

## 2012-03-30 ENCOUNTER — Other Ambulatory Visit (INDEPENDENT_AMBULATORY_CARE_PROVIDER_SITE_OTHER): Payer: Medicare Other

## 2012-03-30 ENCOUNTER — Ambulatory Visit (INDEPENDENT_AMBULATORY_CARE_PROVIDER_SITE_OTHER): Payer: Medicare Other | Admitting: Internal Medicine

## 2012-03-30 VITALS — BP 120/88 | HR 66 | Temp 98.1°F | Ht 71.0 in | Wt 200.5 lb

## 2012-03-30 DIAGNOSIS — N320 Bladder-neck obstruction: Secondary | ICD-10-CM | POA: Diagnosis not present

## 2012-03-30 DIAGNOSIS — R5381 Other malaise: Secondary | ICD-10-CM | POA: Diagnosis not present

## 2012-03-30 DIAGNOSIS — Z0000 Encounter for general adult medical examination without abnormal findings: Secondary | ICD-10-CM

## 2012-03-30 DIAGNOSIS — F528 Other sexual dysfunction not due to a substance or known physiological condition: Secondary | ICD-10-CM

## 2012-03-30 DIAGNOSIS — R5383 Other fatigue: Secondary | ICD-10-CM

## 2012-03-30 DIAGNOSIS — E785 Hyperlipidemia, unspecified: Secondary | ICD-10-CM

## 2012-03-30 DIAGNOSIS — Z136 Encounter for screening for cardiovascular disorders: Secondary | ICD-10-CM | POA: Diagnosis not present

## 2012-03-30 DIAGNOSIS — L989 Disorder of the skin and subcutaneous tissue, unspecified: Secondary | ICD-10-CM

## 2012-03-30 LAB — CBC WITH DIFFERENTIAL/PLATELET
Basophils Absolute: 0 10*3/uL (ref 0.0–0.1)
Basophils Relative: 0.6 % (ref 0.0–3.0)
Eosinophils Absolute: 0.1 10*3/uL (ref 0.0–0.7)
Eosinophils Relative: 1.8 % (ref 0.0–5.0)
HCT: 46.5 % (ref 39.0–52.0)
Hemoglobin: 16 g/dL (ref 13.0–17.0)
Lymphocytes Relative: 32.8 % (ref 12.0–46.0)
Lymphs Abs: 2.1 10*3/uL (ref 0.7–4.0)
MCHC: 34.4 g/dL (ref 30.0–36.0)
MCV: 88.9 fl (ref 78.0–100.0)
Monocytes Absolute: 0.7 10*3/uL (ref 0.1–1.0)
Monocytes Relative: 10.8 % (ref 3.0–12.0)
Neutro Abs: 3.5 10*3/uL (ref 1.4–7.7)
Neutrophils Relative %: 54 % (ref 43.0–77.0)
Platelets: 194 10*3/uL (ref 150.0–400.0)
RBC: 5.24 Mil/uL (ref 4.22–5.81)
RDW: 12.5 % (ref 11.5–14.6)
WBC: 6.5 10*3/uL (ref 4.5–10.5)

## 2012-03-30 LAB — BASIC METABOLIC PANEL
BUN: 14 mg/dL (ref 6–23)
CO2: 28 mEq/L (ref 19–32)
Calcium: 9.3 mg/dL (ref 8.4–10.5)
Chloride: 105 mEq/L (ref 96–112)
Creatinine, Ser: 1 mg/dL (ref 0.4–1.5)
GFR: 77.04 mL/min (ref >60.00)
Glucose, Bld: 112 mg/dL — ABNORMAL HIGH (ref 70–99)
Potassium: 5 mEq/L (ref 3.5–5.1)
Sodium: 139 mEq/L (ref 135–145)

## 2012-03-30 LAB — TSH: TSH: 1.36 u[IU]/mL (ref 0.35–5.50)

## 2012-03-30 LAB — LIPID PANEL
Cholesterol: 188 mg/dL (ref 0–200)
HDL: 45.2 mg/dL (ref >39.00)
LDL Cholesterol: 127 mg/dL — ABNORMAL HIGH (ref 0–99)
Total CHOL/HDL Ratio: 4
Triglycerides: 79 mg/dL (ref 0.0–149.0)
VLDL: 15.8 mg/dL (ref 0.0–40.0)

## 2012-03-30 LAB — URINALYSIS, ROUTINE W REFLEX MICROSCOPIC
Bilirubin Urine: NEGATIVE
Hgb urine dipstick: NEGATIVE
Ketones, ur: NEGATIVE
Leukocytes, UA: NEGATIVE
Nitrite: NEGATIVE
Specific Gravity, Urine: >=1.03 (ref 1.000–1.030)
Total Protein, Urine: NEGATIVE
Urine Glucose: NEGATIVE
Urobilinogen, UA: 0.2 (ref 0.0–1.0)
pH: 5.5 (ref 5.0–8.0)

## 2012-03-30 LAB — HEPATIC FUNCTION PANEL
ALT: 24 U/L (ref 0–53)
AST: 30 U/L (ref 0–37)
Albumin: 4.2 g/dL (ref 3.5–5.2)
Alkaline Phosphatase: 56 U/L (ref 39–117)
Bilirubin, Direct: 0.4 mg/dL — ABNORMAL HIGH (ref 0.0–0.3)
Total Bilirubin: 1.8 mg/dL — ABNORMAL HIGH (ref 0.3–1.2)
Total Protein: 7 g/dL (ref 6.0–8.3)

## 2012-03-30 LAB — PSA: PSA: 0.91 ng/mL (ref 0.10–4.00)

## 2012-03-30 MED ORDER — SILDENAFIL CITRATE 100 MG PO TABS: 50.0000 mg | ORAL_TABLET | Freq: Every day | ORAL | Status: DC | PRN

## 2012-03-30 MED ORDER — ATORVASTATIN CALCIUM 10 MG PO TABS: 10.0000 mg | ORAL_TABLET | Freq: Every day | ORAL | Status: DC

## 2012-03-30 NOTE — Patient Instructions (Addendum)
Your EKG was OK today You will be contacted regarding the referral for: colonoscopy, and Dr Terri Piedra Continue all other medications as before; your refill was sent today Please take the Aspirin 81 mg - 1 per day (coated only)  - Every Day Please go to LAB in the Basement for the blood and/or urine tests to be done today You will be contacted by phone if any changes need to be made immediately.  Otherwise, you will receive a letter about your results with an explanation. Please return in 1 year for your yearly visit, or sooner if needed

## 2012-03-30 NOTE — Assessment & Plan Note (Addendum)
ECG reviewed as per emr, stable overall by hx and exam, most recent data reviewed with pt, and pt to continue medical treatment as before, for lower chol diet, for lipitor Lab Results  Component Value Date   LDLCALC 127* 03/30/2012

## 2012-03-31 ENCOUNTER — Encounter: Payer: Self-pay | Admitting: Internal Medicine

## 2012-04-02 ENCOUNTER — Encounter: Payer: Self-pay | Admitting: Internal Medicine

## 2012-04-02 NOTE — Assessment & Plan Note (Signed)
Etiology unclear, Exam otherwise benign, to check labs as documented, follow with expectant management  

## 2012-04-02 NOTE — Assessment & Plan Note (Signed)
?  actinic keratosis vs other - for derm referral

## 2012-04-02 NOTE — Assessment & Plan Note (Addendum)
Ok for viagra prn,  to f/u any worsening symptoms or concerns  

## 2012-04-02 NOTE — Progress Notes (Signed)
Subjective:    Patient ID: Charles Lawson, male    DOB: [birth year], 71 y.o.   MRN: [id number]  HPI  Here for f/u;  Overall doing ok;  Pt denies CP, worsening SOB, DOE, wheezing, orthopnea, PND, worsening LE edema, palpitations, dizziness or syncope.  Pt denies neurological change such as new Headache, facial or extremity weakness.  Pt denies polydipsia, polyuria, or low sugar symptoms. Pt states overall good compliance with treatment and medications, good tolerability, and trying to follow lower cholesterol diet.  Pt denies worsening depressive symptoms, suicidal ideation or panic. No fever, wt loss, night sweats, loss of appetite, or other constitutional symptoms.  Pt states good ability with ADL's, low fall risk, home safety reviewed and adequate, no significant changes in hearing or vision, and occasionally active with exercise.  Does have sense of ongoing fatigue, but denies signficant hypersomnolence. Has had ongoing ED symptoms over the past 6 months. Past Medical History  Diagnosis Date  . 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 Allergic rhinitis, cause unspecified   . Arthritis   . Hyperlipidemia   . HYPERLIPIDEMIA 01/30/2010    Qualifier: Diagnosis of  By: Rita Ohara     Past Surgical History  Procedure Date  . Back surgery   . Colonscopy   . Herniorrhaphy right   . Spine surgery     lumbar laminectomy  . Hernia repair   . Lumbar laminectomy   . Basal cell carcinoma excision     reports that he has never smoked. He has never used smokeless tobacco. He reports that he drinks alcohol. He reports that he does not use illicit drugs. family history includes COPD in his mother and Cancer in his father. Allergies  Allergen Reactions  . Penicillins    Current Outpatient Prescriptions on File Prior to Visit    Medication Sig Dispense Refill  . aspirin 81 MG tablet Take 81 mg by mouth daily.        Marland Kitchen atorvastatin (LIPITOR) 10 MG tablet Take 1 tablet (10 mg total) by mouth daily.  90 tablet  3  . sildenafil (VIAGRA) 100 MG tablet Take 0.5-1 tablets (50-100 mg total) by mouth daily as needed for erectile dysfunction.  5 tablet  11   Review of Systems Review of Systems  Constitutional: Negative for diaphoresis and unexpected weight change.  HENT: Negative for drooling and tinnitus.   Eyes: Negative for photophobia and visual disturbance.  Respiratory: Negative for choking and stridor.   Gastrointestinal: Negative for vomiting and blood in stool.  Genitourinary: Negative for hematuria and decreased urine volume.  Musculoskeletal: Negative for gait problem.  Skin: Negative for color change and wound.  Neurological: Negative for tremors and numbness.  Psychiatric/Behavioral: Negative for decreased concentration. The patient is not hyperactive.      Objective:   Physical Exam BP 120/88  Pulse 66  Temp 98.1 F (36.7 C) (Oral)  Ht 5\' 11"  (1.803 m)  Wt 200 lb 8 oz (90.946 kg)  BMI 27.96 kg/m2  SpO2 97% Physical Exam  VS noted Constitutional: Pt appears well-developed and well-nourished.  HENT: Head: Normocephalic.  Right Ear: External ear normal.  Left Ear: External ear normal.  Eyes: Conjunctivae and EOM are normal. Pupils are equal, round, and reactive to light.  Neck: Normal range of  motion. Neck supple.  Cardiovascular: Normal rate and regular rhythm.   Pulmonary/Chest: Effort normal and breath sounds normal.  Abd:  Soft, NT, non-distended, + BS Neurological: Pt is alert. No cranial nerve deficit.  Skin: Skin is warm. No erythema.  Skin lesion to right mid back, tan, raised, regular, single toned, nonulcerated Psychiatric: Pt behavior is normal. Thought content normal.     Assessment & Plan:

## 2012-04-02 NOTE — Assessment & Plan Note (Signed)
Also due for colonoscopy

## 2012-04-02 NOTE — Assessment & Plan Note (Signed)
Also due for PSA, asympt, for lab today

## 2012-04-27 DIAGNOSIS — D235 Other benign neoplasm of skin of trunk: Secondary | ICD-10-CM | POA: Diagnosis not present

## 2012-04-27 DIAGNOSIS — D485 Neoplasm of uncertain behavior of skin: Secondary | ICD-10-CM | POA: Diagnosis not present

## 2012-05-25 ENCOUNTER — Ambulatory Visit (AMBULATORY_SURGERY_CENTER): Payer: Medicare Other | Admitting: *Deleted

## 2012-05-25 VITALS — Ht 70.5 in | Wt 199.3 lb

## 2012-05-25 DIAGNOSIS — Z1211 Encounter for screening for malignant neoplasm of colon: Secondary | ICD-10-CM

## 2012-05-25 MED ORDER — SUPREP BOWEL PREP KIT 17.5-3.13-1.6 GM/177ML PO SOLN: ORAL | Status: DC

## 2012-05-31 ENCOUNTER — Ambulatory Visit (AMBULATORY_SURGERY_CENTER): Payer: Medicare Other | Admitting: Internal Medicine

## 2012-05-31 ENCOUNTER — Encounter: Payer: Self-pay | Admitting: Internal Medicine

## 2012-05-31 VITALS — BP 142/77 | HR 55 | Temp 97.8°F | Resp 16 | Ht 70.0 in | Wt 199.0 lb

## 2012-05-31 DIAGNOSIS — N320 Bladder-neck obstruction: Secondary | ICD-10-CM | POA: Diagnosis not present

## 2012-05-31 DIAGNOSIS — Z1211 Encounter for screening for malignant neoplasm of colon: Secondary | ICD-10-CM

## 2012-05-31 DIAGNOSIS — M129 Arthropathy, unspecified: Secondary | ICD-10-CM | POA: Diagnosis not present

## 2012-05-31 DIAGNOSIS — D126 Benign neoplasm of colon, unspecified: Secondary | ICD-10-CM

## 2012-05-31 DIAGNOSIS — N400 Enlarged prostate without lower urinary tract symptoms: Secondary | ICD-10-CM | POA: Diagnosis not present

## 2012-05-31 DIAGNOSIS — E785 Hyperlipidemia, unspecified: Secondary | ICD-10-CM | POA: Diagnosis not present

## 2012-05-31 MED ORDER — SODIUM CHLORIDE 0.9 % IV SOLN: 500.0000 mL | INTRAVENOUS | Status: DC

## 2012-05-31 NOTE — Op Note (Signed)
[facility] [address] [place], [address]   COLONOSCOPY PROCEDURE REPORT  PATIENT: Charles Lawson, Charles Lawson  MR#: [id number] BIRTHDATE: May 10, 1941 , 70  yrs. old GENDER: Male ENDOSCOPIST: Iva Boop, MD, [facility] REFERRED BY: PROCEDURE DATE:  05/31/2012 PROCEDURE:   Colonoscopy with snare polypectomy ASA CLASS:   Class II INDICATIONS:average risk screening. MEDICATIONS: propofol (Diprivan) 150mg  IV, MAC sedation, administered by CRNA, and These medications were titrated to patient response per physician's verbal order  DESCRIPTION OF PROCEDURE:   After the risks benefits and alternatives of the procedure were thoroughly explained, informed consent was obtained.  A digital rectal exam revealed no abnormalities of the rectum and A digital rectal exam revealed no prostatic nodules.   The LB CF-[id number]  endoscope was introduced through the anus and advanced to the cecum, which was identified by both the appendix and ileocecal valve. No adverse events experienced.   The quality of the prep was Suprep excellent The instrument was then slowly withdrawn as the colon was fully examined.      COLON FINDINGS: Two sessile polyps measuring 8 mm in size were found in the descending colon and rectum.  A polypectomy was performed using snare cautery.  The resection was complete and the polyp tissue was completely retrieved.   Mild diverticulosis was noted in the sigmoid colon.   The colon mucosa was otherwise normal including right colon retroflexion.  Small internal hemorrhoids were found.  Retroflexed views revealed internal hemorrhoids. The time to cecum=3 minutes 20 seconds.  Withdrawal time=7 minutes 17 seconds.  The scope was withdrawn and the procedure completed. COMPLICATIONS: There were no complications.  ENDOSCOPIC IMPRESSION: 1.   Two sessile polyps measuring 8 mm in size were found in the descending colon and rectum; polypectomy was performed using  snare cautery 2.   Mild diverticulosis was noted in the sigmoid colon 3.   The colon mucosa was otherwise normal 4.   Small internal hemorrhoids  RECOMMENDATIONS: Hold aspirin, aspirin products, and anti-inflammatory medication for 1 week.   eSigned:  Iva Boop, MD, [facility] 05/31/2012 11:41 AM   cc: The Patient

## 2012-05-31 NOTE — Progress Notes (Signed)
The pt tolerated the colonoscopy very well. Maw   

## 2012-05-31 NOTE — Patient Instructions (Addendum)
The colonoscopy showed 2 polyps that I removed. They appear benign (no  cancer). I will send a letter after the pathology results are in.  Thank you for choosing me and [facility].  Iva Boop, MD, FACG YOU HAD AN ENDOSCOPIC PROCEDURE TODAY AT THE [facility]: Refer to the procedure report that was given to you for any specific questions about what was found during the examination.  If the procedure report does not answer your questions, please call your gastroenterologist to clarify.  If you requested that your care partner not be given the details of your procedure findings, then the procedure report has been included in a sealed envelope for you to review at your convenience later.  YOU SHOULD EXPECT: Some feelings of bloating in the abdomen. Passage of more gas than usual.  Walking can help get rid of the air that was put into your GI tract during the procedure and reduce the bloating. If you had a lower endoscopy (such as a colonoscopy or flexible sigmoidoscopy) you may notice spotting of blood in your stool or on the toilet paper. If you underwent a bowel prep for your procedure, then you may not have a normal bowel movement for a few days.  DIET: Your first meal following the procedure should be a light meal and then it is ok to progress to your normal diet.  A half-sandwich or bowl of soup is an example of a good first meal.  Heavy or fried foods are harder to digest and may make you feel nauseous or bloated.  Likewise meals heavy in dairy and vegetables can cause extra gas to form and this can also increase the bloating.  Drink plenty of fluids but you should avoid alcoholic beverages for 24 hours.  ACTIVITY: Your care partner should take you home directly after the procedure.  You should plan to take it easy, moving slowly for the rest of the day.  You can resume normal activity the day after the procedure however you should NOT DRIVE or use heavy machinery for 24  hours (because of the sedation medicines used during the test).    SYMPTOMS TO REPORT IMMEDIATELY: A gastroenterologist can be reached at any hour.  During normal business hours, 8:30 AM to 5:00 PM Monday through Friday, call [phone].  After hours and on weekends, please call the GI answering service at [phone] who will take a message and have the physician on call contact you.   Following lower endoscopy (colonoscopy or flexible sigmoidoscopy):  Excessive amounts of blood in the stool  Significant tenderness or worsening of abdominal pains  Swelling of the abdomen that is new, acute  Fever of 100F or higher  Following upper endoscopy (EGD)  Vomiting of blood or coffee ground material  New chest pain or pain under the shoulder blades  Painful or persistently difficult swallowing  New shortness of breath  Fever of 100F or higher  Black, tarry-looking stools  FOLLOW UP: If any biopsies were taken you will be contacted by phone or by letter within the next 1-3 weeks.  Call your gastroenterologist if you have not heard about the biopsies in 3 weeks.  Our staff will call the home number listed on your records the next business day following your procedure to check on you and address any questions or concerns that you may have at that time regarding the information given to you following your procedure. This is a courtesy call and so if there  is no answer at the home number and we have not heard from you through the emergency physician on call, we will assume that you have returned to your regular daily activities without incident.  SIGNATURES/CONFIDENTIALITY: You and/or your care partner have signed paperwork which will be entered into your electronic medical record.  These signatures attest to the fact that that the information above on your After Visit Summary has been reviewed and is understood.  Full responsibility of the confidentiality of this discharge information lies with  you and/or your care-partner.   Polyp, diverticulosis, high fiber diet, and hemorrhoid information given prior to discharge.  No aspirin, aspirin products, or anti-inflammatory medication for one week.

## 2012-06-01 ENCOUNTER — Telehealth: Payer: Self-pay | Admitting: *Deleted

## 2012-06-01 NOTE — Telephone Encounter (Signed)
  Follow up Call-  Call back number 05/31/2012  Post procedure Call Back phone  # [phone]  Permission to leave phone message Yes     Patient questions:  Do you have a fever, pain , or abdominal swelling? no Pain Score  0 *  Have you tolerated food without any problems? yes  Have you been able to return to your normal activities? yes  Do you have any questions about your discharge instructions: Diet   no Medications  no Follow up visit  no  Do you have questions or concerns about your Care? no  Actions: * If pain score is 4 or above: No action needed, pain <4.

## 2012-06-07 ENCOUNTER — Encounter: Payer: Self-pay | Admitting: Internal Medicine

## 2012-06-07 DIAGNOSIS — Z860100 Personal history of colon polyps, unspecified: Secondary | ICD-10-CM

## 2012-06-07 HISTORY — DX: Personal history of colon polyps, unspecified: Z86.0100

## 2012-06-07 HISTORY — DX: Personal history of colonic polyps: Z86.010

## 2012-06-07 NOTE — Progress Notes (Signed)
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

## 2012-06-08 ENCOUNTER — Encounter: Payer: Medicare Other | Admitting: Internal Medicine

## 2012-06-29 ENCOUNTER — Encounter: Payer: Medicare Other | Admitting: Internal Medicine

## 2012-11-07 ENCOUNTER — Telehealth: Payer: Self-pay | Admitting: Internal Medicine

## 2012-11-07 NOTE — Telephone Encounter (Signed)
Ok with me or with [facility] - feb 18

## 2012-11-07 NOTE — Telephone Encounter (Signed)
Patient Information:  Caller Name: Gustaf  Phone: [phone]  Patient: Charles Lawson, Charles Lawson  Gender: Male  DOB: 10/29/1940  Age: 72 Years  PCP: Oliver Barre (Adults only)  Office Follow Up:  Does the office need to follow up with this patient?: Yes  Instructions For The Office: See note; Requests appointment with Dr Jonny Ruiz for 11/08/12.  RN Note:  Reports fine papular itchy rash that looks like little red spots "like flea bites." Rash began while vacationing. No appointments remain for 11/07/12; Only appointments with Dr Jonny Ruiz for 11/08/12 are same day and cannot be scheduled ahead of time without MD approval. Declined appointment with another Pamala Hurry, NP for 1600 11/08/12 due to prefers to see Dr Jonny Ruiz.  Please call back  Symptoms  Reason For Call & Symptoms: Itchy rash on back and abdomen; started while vacationing .  Reviewed Health History In EMR: Yes  Reviewed Medications In EMR: Yes  Reviewed Allergies In EMR: Yes  Reviewed Surgeries / Procedures: Yes  Date of Onset of Symptoms: 11/03/2012  Treatments Tried: Hydrocortisone cream  Treatments Tried Worked: No  Guideline(s) Used:  Rash or Redness - Widespread  Disposition Per Guideline:   See Today in Office  Reason For Disposition Reached:   Patient wants to be seen  Advice Given:  Reassurance:  There are many causes of widespread rashes and most of the time they are not serious. Common causes include viral illness (e.g., cold viruses) and allergic reactions (to a food, medicine, or environmental exposure).  For Itchy Rashes:  Wash the skin once with gentle non-perfumed soap to remove any irritants. Rinse the soap off thoroughly.  You may also take an oatmeal (Aveeno) bath or take an antihistamine medication by mouth to help reduce the itching.  Oatmeal Aveeno Bath for Itching:  Pat dry with a towel. Do not rub the rash.  Oral Antihistamine Medication for Itching:  Take an antihistamine like diphenhydramine (Benadryl) for widespread  rashes that itch. The adult dosage of Benadryl is 25-50 mg by mouth 4 times daily.  An over-the-counter antihistamine that causes less sleepiness is loratadine (e.g., Alavert or Claritin).  CAUTION: This type of medication may cause sleepiness. Do not drink alcohol, drive, or operate dangerous machinery while taking antihistamines. Do not take these medications if you have prostate problems.  Expected Course:  Most viral rashes disappear within 48 hours.  Call Back If:   You become worse

## 2012-11-08 ENCOUNTER — Ambulatory Visit (INDEPENDENT_AMBULATORY_CARE_PROVIDER_SITE_OTHER): Payer: Medicare Other | Admitting: Internal Medicine

## 2012-11-08 ENCOUNTER — Encounter: Payer: Self-pay | Admitting: Internal Medicine

## 2012-11-08 VITALS — BP 120/82 | HR 70 | Temp 98.1°F | Wt 207.4 lb

## 2012-11-08 DIAGNOSIS — R21 Rash and other nonspecific skin eruption: Secondary | ICD-10-CM | POA: Insufficient documentation

## 2012-11-08 MED ORDER — PREDNISONE 10 MG PO TABS: ORAL_TABLET | ORAL | Status: DC

## 2012-11-08 MED ORDER — METHYLPREDNISOLONE ACETATE 80 MG/ML IJ SUSP
80.0000 mg | Freq: Once | INTRAMUSCULAR | Status: AC
  Administered 2012-11-08: 80 mg via INTRAMUSCULAR

## 2012-11-08 NOTE — Telephone Encounter (Signed)
Called the patient left detailed message of MD's instructions.

## 2012-11-08 NOTE — Telephone Encounter (Signed)
Ok to see next available

## 2012-11-08 NOTE — Patient Instructions (Addendum)
You had the steroid shot today Please take all new medication as prescribed Please continue all other medications as before You can also use Benadryl cream for itching as needed

## 2012-11-08 NOTE — Progress Notes (Signed)
Subjective:    Patient ID: Charles Lawson, male    DOB: [birth year], 72 y.o.   MRN: [id number]  HPI  Here with acute onset diffuse itchy rash to the trunk mostly to the back for several days after taking a second recent viagra.  No other rash or hx of same, no fever or pain, and no swelling of face or other, tongue swelling, sob or wheezing.  Pt denies chest pain, increased sob or doe, wheezing, orthopnea, PND, increased LE swelling, palpitations, dizziness or syncope.  Pt denies new neurological symptoms such as new headache, or facial or extremity weakness or numbness   Pt denies polydipsia, polyuria.   Past Medical History  Diagnosis Date  . 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 Allergic rhinitis, cause unspecified   . Arthritis   . Hyperlipidemia   . HYPERLIPIDEMIA 01/30/2010    Qualifier: Diagnosis of  By: Rita Ohara    . Personal history of colonic polyps 06/07/2012   Past Surgical History  Procedure Laterality Date  . Back surgery    . Herniorrhaphy right    . Spine surgery      lumbar laminectomy  . Hernia repair    . Lumbar laminectomy    . Basal cell carcinoma excision    . Colonoscopy      reports that he has never smoked. He has never used smokeless tobacco. He reports that  drinks alcohol. He reports that he does not use illicit drugs. family history includes COPD in his mother; Cancer in his father; and Lung cancer in his father.  There is no history of Colon cancer. Allergies  Allergen Reactions  . Penicillins   . Viagra (Sildenafil Citrate)    Current Outpatient Prescriptions on File Prior to Visit  Medication Sig Dispense Refill  . aspirin 81 MG tablet Take 81 mg by mouth daily.        Marland Kitchen atorvastatin (LIPITOR) 10 MG tablet Take 1 tablet (10 mg total) by mouth daily.  90 tablet  3   No current  facility-administered medications on file prior to visit.   Review of Systems  Constitutional: Negative for unexpected weight change, or unusual diaphoresis  HENT: Negative for tinnitus.   Eyes: Negative for photophobia and visual disturbance.  Respiratory: Negative for choking and stridor.   Gastrointestinal: Negative for vomiting and blood in stool.  Genitourinary: Negative for hematuria and decreased urine volume.  Musculoskeletal: Negative for acute joint swelling Skin: Negative for color change and wound.  Neurological: Negative for tremors and numbness other than noted  Psychiatric/Behavioral: Negative for decreased concentration or  hyperactivity.       Objective:   Physical Exam BP 120/82  Pulse 70  Temp(Src) 98.1 F (36.7 C) (Oral)  Wt 207 lb 6.4 oz (94.076 kg)  BMI 29.76 kg/m2  SpO2 97% VS noted,  Constitutional: Pt appears well-developed and well-nourished.  HENT: Head: NCAT.  Right Ear: External ear normal.  Left Ear: External ear normal.  Bilat tm's with no erythema.  Max sinus areas non tender.  Pharynx with mild erythema, no exudate No tongue or lip swelling Eyes: Conjunctivae and EOM are normal. Pupils are equal, round, and reactive to light.  Neck: Normal range of motion. Neck supple.  Cardiovascular: Normal rate and regular rhythm.  Pulmonary/Chest: Effort normal and breath sounds normal.  Abd:  Soft, NT, non-distended, + BS Neurological: Pt is alert. Not confused  Skin: diffuse papular rash to back with several pustular like lesion nontender, few lesions noted left abd area as well Psychiatric: Pt behavior is normal. Thought content normal.     Assessment & Plan:

## 2012-11-08 NOTE — Assessment & Plan Note (Signed)
C/w prob drug rash - for depomedrol, predpack asd,  to f/u any worsening symptoms or concerns

## 2013-03-08 ENCOUNTER — Encounter (HOSPITAL_COMMUNITY): Payer: Self-pay

## 2013-03-08 ENCOUNTER — Emergency Department (HOSPITAL_COMMUNITY)
Admission: EM | Admit: 2013-03-08 | Discharge: 2013-03-08 | Disposition: A | Discharge status: Home / Self Care | Payer: Medicare Other | Attending: Emergency Medicine | Admitting: Emergency Medicine

## 2013-03-08 DIAGNOSIS — R21 Rash and other nonspecific skin eruption: Secondary | ICD-10-CM | POA: Insufficient documentation

## 2013-03-08 DIAGNOSIS — L255 Unspecified contact dermatitis due to plants, except food: Secondary | ICD-10-CM | POA: Insufficient documentation

## 2013-03-08 DIAGNOSIS — Z87448 Personal history of other diseases of urinary system: Secondary | ICD-10-CM | POA: Diagnosis not present

## 2013-03-08 DIAGNOSIS — Z860100 Personal history of colon polyps, unspecified: Secondary | ICD-10-CM | POA: Insufficient documentation

## 2013-03-08 DIAGNOSIS — Z85828 Personal history of other malignant neoplasm of skin: Secondary | ICD-10-CM | POA: Insufficient documentation

## 2013-03-08 DIAGNOSIS — Z880 Allergy status to penicillin: Secondary | ICD-10-CM | POA: Insufficient documentation

## 2013-03-08 DIAGNOSIS — E785 Hyperlipidemia, unspecified: Secondary | ICD-10-CM | POA: Diagnosis not present

## 2013-03-08 DIAGNOSIS — Z7982 Long term (current) use of aspirin: Secondary | ICD-10-CM | POA: Insufficient documentation

## 2013-03-08 DIAGNOSIS — M129 Arthropathy, unspecified: Secondary | ICD-10-CM | POA: Diagnosis not present

## 2013-03-08 DIAGNOSIS — L237 Allergic contact dermatitis due to plants, except food: Secondary | ICD-10-CM

## 2013-03-08 MED ORDER — PREDNISONE 20 MG PO TABS
40.0000 mg | ORAL_TABLET | Freq: Once | ORAL | Status: AC
  Administered 2013-03-08: 40 mg via ORAL
  Filled 2013-03-08: qty 2

## 2013-03-08 MED ORDER — HYDROCORTISONE 1 % EX CREA: TOPICAL_CREAM | Freq: Two times a day (BID) | CUTANEOUS | Status: DC

## 2013-03-08 MED ORDER — PREDNISONE 20 MG PO TABS: 40.0000 mg | ORAL_TABLET | Freq: Every day | ORAL | Status: DC

## 2013-03-08 NOTE — ED Notes (Signed)
Pt c/o poison oak to arms/legs/head, red rash noted

## 2013-03-08 NOTE — ED Provider Notes (Signed)
History     CSN: [id number]  Arrival date & time 03/08/13  9562   First MD Initiated Contact with Patient 03/08/13 [phone]      Chief Complaint  Patient presents with  . Poison Oak    (Consider location/radiation/quality/duration/timing/severity/associated sxs/prior treatment) HPI Comments: Patient is a 72 year old male who presents with a rash since yesterday. The rash started gradually and progressively worsened since the onset. The rash is located on arms, legs, abdomen, back and head. Patient has tried nothing without relief. Patient reports he was working outside yesterday around plants that appeared to be poison oak Patient denies new exposures to medications, soaps, lotions, detergent. Patient reports associated occasional itching. No aggravating/alleviating factors. Patient denies fever, chills, NVD, sore throat, oral lesions, ocular involvement, throat closing, wheezing, SOB, chest pain, abdominal pain.      Past Medical History  Diagnosis Date  . 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 Allergic rhinitis, cause unspecified   . Arthritis   . Hyperlipidemia   . HYPERLIPIDEMIA 01/30/2010    Qualifier: Diagnosis of  By: Rita Ohara    . Personal history of colonic polyps 06/07/2012    Past Surgical History  Procedure Laterality Date  . Back surgery    . Herniorrhaphy right    . Spine surgery      lumbar laminectomy  . Hernia repair    . Lumbar laminectomy    . Basal cell carcinoma excision    . Colonoscopy      Family History  Problem Relation Age of Onset  . COPD Mother   . Cancer Father   . Lung cancer Father   . Colon cancer Neg Hx     History  Substance Use Topics  . Smoking status: Never Smoker   . Smokeless tobacco: Never Used  . Alcohol Use: Yes     Comment: occasional maybe once month       Review of Systems  Skin: Positive for rash.  All other systems reviewed and are negative.    Allergies  Penicillins and Viagra  Home Medications   Current Outpatient Rx  Name  Route  Sig  Dispense  Refill  . aspirin 81 MG tablet   Oral   Take 81 mg by mouth daily.           Marland Kitchen atorvastatin (LIPITOR) 10 MG tablet   Oral   Take 1 tablet (10 mg total) by mouth daily.   90 tablet   3   . predniSONE (DELTASONE) 10 MG tablet      3 tabs by mouth per day for 3 days,2tabs per day for 3 days,1tab per day for 3 days   18 tablet   0     BP 142/87  Pulse 69  Temp(Src) 98 F (36.7 C) (Oral)  Resp 20  SpO2 98%  Physical Exam  Nursing note and vitals reviewed. Constitutional: He is oriented to person, place, and time. He appears well-developed and well-nourished. No distress.  HENT:  Head: Normocephalic and atraumatic.  Eyes: Conjunctivae are normal.  Neck: Normal range of motion.  Cardiovascular: Normal rate and regular rhythm.  Exam reveals no gallop and no friction rub.   No murmur heard. Pulmonary/Chest: Effort normal and breath sounds normal. He has no wheezes. He has  no rales. He exhibits no tenderness.  Abdominal: Soft. There is no tenderness.  Musculoskeletal: Normal range of motion.  Neurological: He is alert and oriented to person, place, and time.  Speech is goal-oriented. Moves limbs without ataxia.   Skin: Skin is warm and dry.  Diffuse macular papular rash with erythema and overlying excoriations on bilateral arms and legs as well as torso and back.   Psychiatric: He has a normal mood and affect. His behavior is normal.    ED Course  Procedures (including critical care time)  Labs Reviewed - No data to display No results found.   1. Poison oak dermatitis       MDM  8:49 AM Patient likely has poison oak. I will treat him with a prednisone course as well as topical cortisone cream. Vitals stable and patient afebrile. No ocular  involvement.         Emilia Beck, [place] 03/08/13 [phone]

## 2013-03-08 NOTE — ED Provider Notes (Signed)
Medical screening examination/treatment/procedure(s) were performed by non-physician practitioner and as supervising physician I was immediately available for consultation/collaboration.  Cybele Maule, MD 03/08/13 1412 

## 2013-03-10 ENCOUNTER — Encounter: Payer: Self-pay | Admitting: Internal Medicine

## 2013-03-10 ENCOUNTER — Ambulatory Visit (INDEPENDENT_AMBULATORY_CARE_PROVIDER_SITE_OTHER): Payer: Medicare Other | Admitting: Internal Medicine

## 2013-03-10 VITALS — BP 122/84 | HR 67 | Temp 98.7°F | Ht 71.0 in | Wt 202.2 lb

## 2013-03-10 DIAGNOSIS — L509 Urticaria, unspecified: Secondary | ICD-10-CM

## 2013-03-10 DIAGNOSIS — J309 Allergic rhinitis, unspecified: Secondary | ICD-10-CM

## 2013-03-10 MED ORDER — PREDNISONE 10 MG PO TABS: ORAL_TABLET | ORAL | Status: DC

## 2013-03-10 MED ORDER — METHYLPREDNISOLONE ACETATE 80 MG/ML IJ SUSP
80.0000 mg | Freq: Once | INTRAMUSCULAR | Status: AC
  Administered 2013-03-10: 80 mg via INTRAMUSCULAR

## 2013-03-10 MED ORDER — HYDROXYZINE HCL 25 MG PO TABS: ORAL_TABLET | ORAL | Status: DC

## 2013-03-10 NOTE — Progress Notes (Signed)
Subjective:    Patient ID: Charles Lawson, male    DOB: [birth year], 72 y.o.   MRN: [id number]  HPI   Here to f/u after recent visit to ER 3 days ago, per pt sounds like tx for ? Contact dermatitis for small area pruritic rash though he is unsure of any specific potential contact allergen; tx with predpack and suggested f/u with Dr Roxan Hockey, has appt approx 1.5 wks.  Since then unfortunately the rash has become more diffuse to all torso and some to extremities, much more pruritic and miserable, but no lip/tongue swelling, and Pt denies chest pain, increased sob or doe, wheezing, orthopnea, PND, increased LE swelling, palpitations, dizziness or syncope.  Pt denies new neurological symptoms such as new headache, or facial or extremity weakness or numbness   Pt denies polydipsia, polyuria. Does have several wks ongoing nasal allergy symptoms with clearish congestion, itch and sneezing, but this is quite different. Past Medical History  Diagnosis Date  . 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 Allergic rhinitis, cause unspecified   . Arthritis   . Hyperlipidemia   . HYPERLIPIDEMIA 01/30/2010    Qualifier: Diagnosis of  By: Rita Ohara    . Personal history of colonic polyps 06/07/2012   Past Surgical History  Procedure Laterality Date  . Back surgery    . Herniorrhaphy right    . Spine surgery      lumbar laminectomy  . Hernia repair    . Lumbar laminectomy    . Basal cell carcinoma excision    . Colonoscopy      reports that he has never smoked. He has never used smokeless tobacco. He reports that  drinks alcohol. He reports that he does not use illicit drugs. family history includes COPD in his mother; Cancer in his father; and Lung cancer in his father.  There is no history of Colon cancer. Allergies  Allergen Reactions   . Penicillins   . Viagra (Sildenafil Citrate)    Current Outpatient Prescriptions on File Prior to Visit  Medication Sig Dispense Refill  . aspirin 81 MG tablet Take 81 mg by mouth daily.        Marland Kitchen atorvastatin (LIPITOR) 10 MG tablet Take 1 tablet (10 mg total) by mouth daily.  90 tablet  3  . hydrocortisone cream 1 % Apply topically 2 (two) times daily.  30 g  2   No current facility-administered medications on file prior to visit.   Review of Systems All otherwise neg per pt     Objective:   Physical Exam BP 122/84  Pulse 67  Temp(Src) 98.7 F (37.1 C) (Oral)  Ht 5\' 11"  (1.803 m)  Wt 202 lb 4 oz (91.74 kg)  BMI 28.22 kg/m2  SpO2 95% VS noted, not ill but uncomfortable, itching Constitutional: Pt appears well-developed and well-nourished.  HENT: Head: NCAT.  Right Ear: External ear normal.  Left Ear: External ear normal.  Eyes: Conjunctivae and EOM are normal. Pupils are equal, round, and reactive to light.  Neck: Normal range of motion. Neck supple.  Cardiovascular: Normal rate and regular rhythm.   Pulmonary/Chest: Effort normal and breath sounds normal.  Neurological: Pt is alert. Not confused  Skin: diffuse hive like rash with TNTC wheel and flare to torso/extremities without angioedema Psychiatric:  Pt behavior is normal. Thought content normal.     Assessment & Plan:

## 2013-03-10 NOTE — Patient Instructions (Addendum)
You had the steroid shot today for Hives OK to finish the prednisone you have, then: Please take all new medication as prescribed - the further prednisone prescription from today, and the hydroxyzine for itching as needed OK to continue the benadryl as needed Please also consider Zantac 150 mg twice per day (OTC) as this has antihistamine effect as well Please continue all other medications as before  Please remember to sign up for My Chart if you have not done so, as this will be important to you in the future with finding out test results, communicating by private email, and scheduling acute appointments online when needed.  Please call next wk if you feel you need a referral to allergist, or seeing Dr Terri Piedra as you mentioned today

## 2013-03-11 NOTE — Assessment & Plan Note (Signed)
stable overall by history and examand pt to continue medical treatment as above,  to f/u any worsening symptoms or concerns

## 2013-03-11 NOTE — Assessment & Plan Note (Signed)
Unfortunately worse despite recent tx, to finish prednisone rx as originally prescribed, then lesser but longer taper after that, atarax prn itching, benadryl otc if needed, zantac 150 bid, and consider f/u with derm, but I think allergy more appropriate if needed

## 2013-03-20 DIAGNOSIS — J3089 Other allergic rhinitis: Secondary | ICD-10-CM | POA: Diagnosis not present

## 2013-03-20 DIAGNOSIS — R21 Rash and other nonspecific skin eruption: Secondary | ICD-10-CM | POA: Diagnosis not present

## 2013-03-20 DIAGNOSIS — J301 Allergic rhinitis due to pollen: Secondary | ICD-10-CM | POA: Diagnosis not present

## 2013-03-20 DIAGNOSIS — J3081 Allergic rhinitis due to animal (cat) (dog) hair and dander: Secondary | ICD-10-CM | POA: Diagnosis not present

## 2013-03-21 DIAGNOSIS — J309 Allergic rhinitis, unspecified: Secondary | ICD-10-CM | POA: Diagnosis not present

## 2013-03-29 DIAGNOSIS — J309 Allergic rhinitis, unspecified: Secondary | ICD-10-CM | POA: Diagnosis not present

## 2013-03-31 DIAGNOSIS — J309 Allergic rhinitis, unspecified: Secondary | ICD-10-CM | POA: Diagnosis not present

## 2013-04-03 ENCOUNTER — Encounter: Payer: Self-pay | Admitting: Internal Medicine

## 2013-04-03 ENCOUNTER — Ambulatory Visit (INDEPENDENT_AMBULATORY_CARE_PROVIDER_SITE_OTHER): Payer: Medicare Other | Admitting: Internal Medicine

## 2013-04-03 ENCOUNTER — Other Ambulatory Visit (INDEPENDENT_AMBULATORY_CARE_PROVIDER_SITE_OTHER): Payer: Medicare Other

## 2013-04-03 VITALS — BP 118/82 | HR 65 | Temp 97.8°F | Ht 71.0 in | Wt 197.0 lb

## 2013-04-03 DIAGNOSIS — J309 Allergic rhinitis, unspecified: Secondary | ICD-10-CM

## 2013-04-03 DIAGNOSIS — R7309 Other abnormal glucose: Secondary | ICD-10-CM

## 2013-04-03 DIAGNOSIS — N320 Bladder-neck obstruction: Secondary | ICD-10-CM | POA: Diagnosis not present

## 2013-04-03 DIAGNOSIS — E785 Hyperlipidemia, unspecified: Secondary | ICD-10-CM

## 2013-04-03 DIAGNOSIS — Z136 Encounter for screening for cardiovascular disorders: Secondary | ICD-10-CM | POA: Diagnosis not present

## 2013-04-03 DIAGNOSIS — Z0000 Encounter for general adult medical examination without abnormal findings: Secondary | ICD-10-CM

## 2013-04-03 DIAGNOSIS — R7302 Impaired glucose tolerance (oral): Secondary | ICD-10-CM

## 2013-04-03 HISTORY — DX: Impaired glucose tolerance (oral): R73.02

## 2013-04-03 LAB — HEPATIC FUNCTION PANEL
ALT: 28 U/L (ref 0–53)
AST: 20 U/L (ref 0–37)
Albumin: 4.1 g/dL (ref 3.5–5.2)
Alkaline Phosphatase: 54 U/L (ref 39–117)
Bilirubin, Direct: 0.1 mg/dL (ref 0.0–0.3)
Total Bilirubin: 0.5 mg/dL (ref 0.3–1.2)
Total Protein: 7.1 g/dL (ref 6.0–8.3)

## 2013-04-03 LAB — CBC WITH DIFFERENTIAL/PLATELET
Basophils Absolute: 0 10*3/uL (ref 0.0–0.1)
Basophils Relative: 0.4 % (ref 0.0–3.0)
Eosinophils Absolute: 0.1 10*3/uL (ref 0.0–0.7)
Eosinophils Relative: 2.3 % (ref 0.0–5.0)
HCT: 48.8 % (ref 39.0–52.0)
Hemoglobin: 16.8 g/dL (ref 13.0–17.0)
Lymphocytes Relative: 31.4 % (ref 12.0–46.0)
Lymphs Abs: 2.1 10*3/uL (ref 0.7–4.0)
MCHC: 34.3 g/dL (ref 30.0–36.0)
MCV: 87.9 fl (ref 78.0–100.0)
Monocytes Absolute: 0.6 10*3/uL (ref 0.1–1.0)
Monocytes Relative: 8.4 % (ref 3.0–12.0)
Neutro Abs: 3.8 10*3/uL (ref 1.4–7.7)
Neutrophils Relative %: 57.5 % (ref 43.0–77.0)
Platelets: 180 10*3/uL (ref 150.0–400.0)
RBC: 5.55 Mil/uL (ref 4.22–5.81)
RDW: 12.3 % (ref 11.5–14.6)
WBC: 6.6 10*3/uL (ref 4.5–10.5)

## 2013-04-03 LAB — BASIC METABOLIC PANEL
BUN: 14 mg/dL (ref 6–23)
CO2: 30 mEq/L (ref 19–32)
Calcium: 9.6 mg/dL (ref 8.4–10.5)
Chloride: 105 mEq/L (ref 96–112)
Creatinine, Ser: 1.1 mg/dL (ref 0.4–1.5)
GFR: 71.9 mL/min (ref >60.00)
Glucose, Bld: 106 mg/dL — ABNORMAL HIGH (ref 70–99)
Potassium: 5 mEq/L (ref 3.5–5.1)
Sodium: 139 mEq/L (ref 135–145)

## 2013-04-03 LAB — URINALYSIS, ROUTINE W REFLEX MICROSCOPIC
Bilirubin Urine: NEGATIVE
Hgb urine dipstick: NEGATIVE
Ketones, ur: NEGATIVE
Leukocytes, UA: NEGATIVE
Nitrite: NEGATIVE
RBC / HPF: NONE SEEN (ref 0–?)
Specific Gravity, Urine: >=1.03 (ref 1.000–1.030)
Total Protein, Urine: NEGATIVE
Urine Glucose: NEGATIVE
Urobilinogen, UA: 0.2 (ref 0.0–1.0)
WBC, UA: NONE SEEN (ref 0–?)
pH: 5.5 (ref 5.0–8.0)

## 2013-04-03 LAB — LIPID PANEL
Cholesterol: 176 mg/dL (ref 0–200)
HDL: 47.6 mg/dL (ref >39.00)
LDL Cholesterol: 110 mg/dL — ABNORMAL HIGH (ref 0–99)
Total CHOL/HDL Ratio: 4
Triglycerides: 93 mg/dL (ref 0.0–149.0)
VLDL: 18.6 mg/dL (ref 0.0–40.0)

## 2013-04-03 LAB — PSA: PSA: 1.21 ng/mL (ref 0.10–4.00)

## 2013-04-03 LAB — HEMOGLOBIN A1C: Hgb A1c MFr Bld: 5.7 % (ref 4.6–6.5)

## 2013-04-03 LAB — TSH: TSH: 1.4 u[IU]/mL (ref 0.35–5.50)

## 2013-04-03 MED ORDER — ATORVASTATIN CALCIUM 10 MG PO TABS: 10.0000 mg | ORAL_TABLET | Freq: Every day | ORAL | Status: DC

## 2013-04-03 MED ORDER — ASPIRIN 81 MG PO TABS: 81.0000 mg | ORAL_TABLET | Freq: Every day | ORAL | Status: DC

## 2013-04-03 NOTE — Assessment & Plan Note (Addendum)
stable overall by history and exam, recent data reviewed with pt, and pt to continue medical treatment as before,  to f/u any worsening symptoms or concerns Lab Results  Component Value Date   LDLCALC 127* 03/30/2012   Encouraged taking the lipitor; ECG reviewed as per emr

## 2013-04-03 NOTE — Assessment & Plan Note (Signed)
Asympt, for a1c today, to f/u any worsening symptoms or concerns . Cont diet and exercise

## 2013-04-03 NOTE — Patient Instructions (Addendum)
Please take your Aspirin 81 mg every day (enteric coated only) Please continue all other medications as before, including the allergy shots, and lipitor Please continue your efforts at being more active, low cholesterol diet, and weight control. You are otherwise up to date with prevention measures today. Please go to the LAB in the Basement (turn left off the elevator) for the tests to be done today You will be contacted by phone if any changes need to be made immediately.  Otherwise, you will receive a letter about your results with an explanation, but please check with MyChart first.  Please remember to sign up for My Chart if you have not done so, as this will be important to you in the future with finding out test results, communicating by private email, and scheduling acute appointments online when needed.  Please return in 1 year for your yearly visit, or sooner if needed

## 2013-04-03 NOTE — Progress Notes (Signed)
Subjective:    Patient ID: Charles Lawson, male    DOB: 13-Sep-1941, 72 y.o.   MRN: [id number]  HPI Here for yearly f/u;  Overall doing ok;  Pt denies CP, worsening SOB, DOE, wheezing, orthopnea, PND, worsening LE edema, palpitations, dizziness or syncope.  Pt denies neurological change such as new headache, facial or extremity weakness.  Pt denies polydipsia, polyuria, or low sugar symptoms. Pt states overall good compliance with treatment and medications, good tolerability, and has been trying to follow lower cholesterol diet.  Pt denies worsening depressive symptoms, suicidal ideation or panic. No fever, night sweats, wt loss, loss of appetite, or other constitutional symptoms.  Pt states good ability with ADL's, has low fall risk, home safety reviewed and adequate, no other significant changes in hearing or vision, and only occasionally active with exercise. No new complaitns.  Now getting allergy shots twice per wk after recent hives. Does have several wks ongoing nasal allergy symptoms with clearish congestion, itch and sneezing, without fever, pain, ST, cough, swelling or wheezing, and controlled with OTC meds prn such as allegra Past Medical History  Diagnosis Date  . 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 Allergic rhinitis, cause unspecified   . Arthritis   . Hyperlipidemia   . HYPERLIPIDEMIA 01/30/2010    Qualifier: Diagnosis of  By: Rita Ohara    . Personal history of colonic polyps 06/07/2012   Past Surgical History  Procedure Laterality Date  . Back surgery    . Herniorrhaphy right    . Spine surgery      lumbar laminectomy  . Hernia repair    . Lumbar laminectomy    . Basal cell carcinoma excision    . Colonoscopy      reports that he has never smoked. He has never used smokeless tobacco. He reports that   drinks alcohol. He reports that he does not use illicit drugs. family history includes COPD in his mother; Cancer in his father; and Lung cancer in his father.  There is no history of Colon cancer. Allergies  Allergen Reactions  . Penicillins   . Viagra (Sildenafil Citrate)    Current Outpatient Prescriptions on File Prior to Visit  Medication Sig Dispense Refill  . atorvastatin (LIPITOR) 10 MG tablet Take 1 tablet (10 mg total) by mouth daily.  90 tablet  3   No current facility-administered medications on file prior to visit.   Not taking the lipitor now.   Review of Systems  Constitutional: Negative for unexpected weight change, or unusual diaphoresis  HENT: Negative for tinnitus.   Eyes: Negative for photophobia and visual disturbance.  Respiratory: Negative for choking and stridor.   Gastrointestinal: Negative for vomiting and blood in stool.  Genitourinary: Negative for hematuria and decreased urine volume.  Musculoskeletal: Negative for acute joint swelling Skin: Negative for color change and wound.  Neurological: Negative for tremors and numbness other than noted  Psychiatric/Behavioral: Negative for decreased concentration or  hyperactivity.       Objective:   Physical Exam BP 118/82  Pulse 65  Temp(Src) 97.8 F (36.6 C) (Oral)  Ht 5\' 11"  (1.803 m)  Wt 197 lb (89.359 kg)  BMI 27.49 kg/m2  SpO2 96% VS noted,  Constitutional: Pt appears well-developed and well-nourished.  HENT: Head: NCAT.  Right Ear: External ear normal.  Left Ear: External ear normal.  Eyes: Conjunctivae and EOM are normal. Pupils are equal, round, and reactive to light.  Neck: Normal range of motion. Neck supple.  Cardiovascular: Normal rate and regular rhythm.   Pulmonary/Chest: Effort normal and breath sounds normal.  Abd:  Soft, NT, non-distended, + BS Neurological: Pt is alert. Not confused  Skin: Skin is warm. No erythema.  Psychiatric: Pt behavior is normal. Thought content normal.      Assessment & Plan:

## 2013-04-04 DIAGNOSIS — J309 Allergic rhinitis, unspecified: Secondary | ICD-10-CM | POA: Diagnosis not present

## 2013-04-06 DIAGNOSIS — J309 Allergic rhinitis, unspecified: Secondary | ICD-10-CM | POA: Diagnosis not present

## 2013-04-11 DIAGNOSIS — J309 Allergic rhinitis, unspecified: Secondary | ICD-10-CM | POA: Diagnosis not present

## 2013-04-13 DIAGNOSIS — J309 Allergic rhinitis, unspecified: Secondary | ICD-10-CM | POA: Diagnosis not present

## 2013-04-17 DIAGNOSIS — J309 Allergic rhinitis, unspecified: Secondary | ICD-10-CM | POA: Diagnosis not present

## 2013-04-20 DIAGNOSIS — J309 Allergic rhinitis, unspecified: Secondary | ICD-10-CM | POA: Diagnosis not present

## 2013-04-24 DIAGNOSIS — J309 Allergic rhinitis, unspecified: Secondary | ICD-10-CM | POA: Diagnosis not present

## 2013-04-28 DIAGNOSIS — J309 Allergic rhinitis, unspecified: Secondary | ICD-10-CM | POA: Diagnosis not present

## 2013-05-01 DIAGNOSIS — J309 Allergic rhinitis, unspecified: Secondary | ICD-10-CM | POA: Diagnosis not present

## 2013-05-04 DIAGNOSIS — J309 Allergic rhinitis, unspecified: Secondary | ICD-10-CM | POA: Diagnosis not present

## 2013-05-08 DIAGNOSIS — J309 Allergic rhinitis, unspecified: Secondary | ICD-10-CM | POA: Diagnosis not present

## 2013-05-10 DIAGNOSIS — J309 Allergic rhinitis, unspecified: Secondary | ICD-10-CM | POA: Diagnosis not present

## 2013-05-16 DIAGNOSIS — J309 Allergic rhinitis, unspecified: Secondary | ICD-10-CM | POA: Diagnosis not present

## 2013-05-18 DIAGNOSIS — J309 Allergic rhinitis, unspecified: Secondary | ICD-10-CM | POA: Diagnosis not present

## 2013-05-23 DIAGNOSIS — J309 Allergic rhinitis, unspecified: Secondary | ICD-10-CM | POA: Diagnosis not present

## 2013-05-26 DIAGNOSIS — J309 Allergic rhinitis, unspecified: Secondary | ICD-10-CM | POA: Diagnosis not present

## 2013-05-29 DIAGNOSIS — J309 Allergic rhinitis, unspecified: Secondary | ICD-10-CM | POA: Diagnosis not present

## 2013-06-01 DIAGNOSIS — J309 Allergic rhinitis, unspecified: Secondary | ICD-10-CM | POA: Diagnosis not present

## 2013-06-06 DIAGNOSIS — J309 Allergic rhinitis, unspecified: Secondary | ICD-10-CM | POA: Diagnosis not present

## 2013-06-14 DIAGNOSIS — J309 Allergic rhinitis, unspecified: Secondary | ICD-10-CM | POA: Diagnosis not present

## 2013-06-22 DIAGNOSIS — J309 Allergic rhinitis, unspecified: Secondary | ICD-10-CM | POA: Diagnosis not present

## 2013-06-29 DIAGNOSIS — J309 Allergic rhinitis, unspecified: Secondary | ICD-10-CM | POA: Diagnosis not present

## 2013-07-03 DIAGNOSIS — J309 Allergic rhinitis, unspecified: Secondary | ICD-10-CM | POA: Diagnosis not present

## 2013-07-04 DIAGNOSIS — J309 Allergic rhinitis, unspecified: Secondary | ICD-10-CM | POA: Diagnosis not present

## 2013-07-24 DIAGNOSIS — J309 Allergic rhinitis, unspecified: Secondary | ICD-10-CM | POA: Diagnosis not present

## 2013-07-31 DIAGNOSIS — J309 Allergic rhinitis, unspecified: Secondary | ICD-10-CM | POA: Diagnosis not present

## 2013-08-07 DIAGNOSIS — J309 Allergic rhinitis, unspecified: Secondary | ICD-10-CM | POA: Diagnosis not present

## 2013-08-11 DIAGNOSIS — J309 Allergic rhinitis, unspecified: Secondary | ICD-10-CM | POA: Diagnosis not present

## 2013-08-15 DIAGNOSIS — J309 Allergic rhinitis, unspecified: Secondary | ICD-10-CM | POA: Diagnosis not present

## 2013-08-21 DIAGNOSIS — J309 Allergic rhinitis, unspecified: Secondary | ICD-10-CM | POA: Diagnosis not present

## 2013-08-28 DIAGNOSIS — J309 Allergic rhinitis, unspecified: Secondary | ICD-10-CM | POA: Diagnosis not present

## 2013-09-04 DIAGNOSIS — J309 Allergic rhinitis, unspecified: Secondary | ICD-10-CM | POA: Diagnosis not present

## 2013-09-11 DIAGNOSIS — J309 Allergic rhinitis, unspecified: Secondary | ICD-10-CM | POA: Diagnosis not present

## 2013-09-18 DIAGNOSIS — J309 Allergic rhinitis, unspecified: Secondary | ICD-10-CM | POA: Diagnosis not present

## 2013-09-26 ENCOUNTER — Ambulatory Visit (INDEPENDENT_AMBULATORY_CARE_PROVIDER_SITE_OTHER): Payer: Medicare Other | Admitting: Family Medicine

## 2013-09-26 ENCOUNTER — Other Ambulatory Visit: Payer: Self-pay | Admitting: Internal Medicine

## 2013-09-26 ENCOUNTER — Encounter: Payer: Self-pay | Admitting: Family Medicine

## 2013-09-26 VITALS — BP 150/88 | HR 64 | Temp 97.9°F | Wt 201.0 lb

## 2013-09-26 DIAGNOSIS — J209 Acute bronchitis, unspecified: Secondary | ICD-10-CM | POA: Diagnosis not present

## 2013-09-26 MED ORDER — BENZONATATE 200 MG PO CAPS: 200.0000 mg | ORAL_CAPSULE | Freq: Three times a day (TID) | ORAL | Status: DC | PRN

## 2013-09-26 MED ORDER — LEVOFLOXACIN 500 MG PO TABS: 500.0000 mg | ORAL_TABLET | Freq: Every day | ORAL | Status: DC

## 2013-09-26 NOTE — Progress Notes (Signed)
SUBJECTIVE:  Charles Lawson is a 73 y.o. male who complains of coryza, congestion, sore throat, nasal blockage, productive cough, enlarged tonsils and wheezing at night for 8 days. He denies a history of chest pain, dizziness, fatigue, shortness of breath, weakness and weight loss and denies a history of asthma. Patient denies smoke cigarettes. Positive sick contacts recently  .[id number] Past Surgical History  Procedure Laterality Date  . Back surgery    . Herniorrhaphy right    . Spine surgery      lumbar laminectomy  . Hernia repair    . Lumbar laminectomy    . Basal cell carcinoma excision    . Colonoscopy     History  Substance Use Topics  . Smoking status: Never Smoker   . Smokeless tobacco: Never Used  . Alcohol Use: Yes     Comment: occasional maybe once month   Charles Lawson had no medications administered during this visit.    OBJECTIVE: Blood pressure 150/88, pulse 64, temperature 97.9 F (36.6 C), temperature source Oral, weight 201 lb (91.173 kg), SpO2 96.00%.  He appears well, vital signs are as noted. Ears normal.  Throat and pharynx mild erythema and PND.  Neck supple.adenopathy in the neck. Nose is congested. Sinuses non tender. The chest mild rhonchi diffuse noted.  ASSESSMENT:  viral upper respiratory illness and bronchitis  PLAN: Symptomatic therapy suggested: push fluids, rest and return office visit prn if symptoms persist or worsen. Meds per orders and instructions. . Call or return to clinic prn if these symptoms worsen or fail to improve as anticipated.

## 2013-09-26 NOTE — Progress Notes (Signed)
Pre-visit discussion using our clinic review tool. No additional management support is needed unless otherwise documented below in the visit note.  

## 2013-09-26 NOTE — Patient Instructions (Signed)
Very nice to meet you Take medicine as prescribed for next 7 days Come back next week if not better  Bronchitis Bronchitis is the body's way of reacting to injury and/or infection (inflammation) of the bronchi. Bronchi are the air tubes that extend from the windpipe into the lungs. If the inflammation becomes severe, it may cause shortness of breath. CAUSES  Inflammation may be caused by:  A virus.  Germs (bacteria).  Dust.  Allergens.  Pollutants and many other irritants. The cells lining the bronchial tree are covered with tiny hairs (cilia). These constantly beat upward, away from the lungs, toward the mouth. This keeps the lungs free of pollutants. When these cells become too irritated and are unable to do their job, mucus begins to develop. This causes the characteristic cough of bronchitis. The cough clears the lungs when the cilia are unable to do their job. Without either of these protective mechanisms, the mucus would settle in the lungs. Then you would develop pneumonia. Smoking is a common cause of bronchitis and can contribute to pneumonia. Stopping this habit is the single most important thing you can do to help yourself. TREATMENT   Your caregiver may prescribe an antibiotic if the cough is caused by bacteria. Also, medicines that open up your airways make it easier to breathe. Your caregiver may also recommend or prescribe an expectorant. It will loosen the mucus to be coughed up. Only take over-the-counter or prescription medicines for pain, discomfort, or fever as directed by your caregiver.  Removing whatever causes the problem (smoking, for example) is critical to preventing the problem from getting worse.  Cough suppressants may be prescribed for relief of cough symptoms.  Inhaled medicines may be prescribed to help with symptoms now and to help prevent problems from returning.  For those with recurrent (chronic) bronchitis, there may be a need for steroid  medicines. SEEK IMMEDIATE MEDICAL CARE IF:   During treatment, you develop more pus-like mucus (purulent sputum).  You have a fever.  You become progressively more ill.  You have increased difficulty breathing, wheezing, or shortness of breath. It is necessary to seek immediate medical care if you are elderly or sick from any other disease. MAKE SURE YOU:   Understand these instructions.  Will watch your condition.  Will get help right away if you are not doing well or get worse. Document Released: 09/07/2005 Document Revised: 05/10/2013 Document Reviewed: 05/02/2013 [facility] Patient Information 2014 [place].

## 2013-09-28 DIAGNOSIS — J309 Allergic rhinitis, unspecified: Secondary | ICD-10-CM | POA: Diagnosis not present

## 2013-10-02 DIAGNOSIS — J309 Allergic rhinitis, unspecified: Secondary | ICD-10-CM | POA: Diagnosis not present

## 2013-10-09 DIAGNOSIS — J309 Allergic rhinitis, unspecified: Secondary | ICD-10-CM | POA: Diagnosis not present

## 2013-10-12 ENCOUNTER — Telehealth: Payer: Self-pay | Admitting: Internal Medicine

## 2013-10-12 MED ORDER — SILDENAFIL CITRATE 100 MG PO TABS: ORAL_TABLET | ORAL | Status: DC

## 2013-10-12 NOTE — Addendum Note (Signed)
Addended by: Biagio Borg on: 10/12/2013 12:56 PM   Modules accepted: Orders

## 2013-10-12 NOTE — Telephone Encounter (Signed)
10/12/2013  Pt is requesting rx VIAGRA 100 MG be switched to RX Sildenafil (generic).  Pt would also like to have this script sent to [facility], [address] [place],  [place] [phone].   Contact pt if there are any questions.

## 2013-10-12 NOTE — Telephone Encounter (Signed)
Done erx 

## 2013-10-12 NOTE — Telephone Encounter (Signed)
Patient informed. 

## 2013-10-16 DIAGNOSIS — J309 Allergic rhinitis, unspecified: Secondary | ICD-10-CM | POA: Diagnosis not present

## 2013-10-18 DIAGNOSIS — R05 Cough: Secondary | ICD-10-CM | POA: Diagnosis not present

## 2013-10-18 DIAGNOSIS — R21 Rash and other nonspecific skin eruption: Secondary | ICD-10-CM | POA: Diagnosis not present

## 2013-10-18 DIAGNOSIS — J301 Allergic rhinitis due to pollen: Secondary | ICD-10-CM | POA: Diagnosis not present

## 2013-10-18 DIAGNOSIS — J3089 Other allergic rhinitis: Secondary | ICD-10-CM | POA: Diagnosis not present

## 2013-10-18 DIAGNOSIS — J3081 Allergic rhinitis due to animal (cat) (dog) hair and dander: Secondary | ICD-10-CM | POA: Diagnosis not present

## 2013-10-18 DIAGNOSIS — R059 Cough, unspecified: Secondary | ICD-10-CM | POA: Diagnosis not present

## 2013-10-23 ENCOUNTER — Telehealth: Payer: Self-pay | Admitting: Internal Medicine

## 2013-10-23 DIAGNOSIS — J309 Allergic rhinitis, unspecified: Secondary | ICD-10-CM | POA: Diagnosis not present

## 2013-10-23 NOTE — Telephone Encounter (Signed)
Patient needs scripts for Sildenafil to be sent to [facility] in [place].  Looks like previous script was sent to [facility].

## 2013-10-24 MED ORDER — SILDENAFIL CITRATE 100 MG PO TABS: ORAL_TABLET | ORAL | Status: DC

## 2013-10-30 DIAGNOSIS — J309 Allergic rhinitis, unspecified: Secondary | ICD-10-CM | POA: Diagnosis not present

## 2013-11-06 DIAGNOSIS — J309 Allergic rhinitis, unspecified: Secondary | ICD-10-CM | POA: Diagnosis not present

## 2013-11-13 DIAGNOSIS — J309 Allergic rhinitis, unspecified: Secondary | ICD-10-CM | POA: Diagnosis not present

## 2013-11-20 DIAGNOSIS — J309 Allergic rhinitis, unspecified: Secondary | ICD-10-CM | POA: Diagnosis not present

## 2013-11-27 DIAGNOSIS — J309 Allergic rhinitis, unspecified: Secondary | ICD-10-CM | POA: Diagnosis not present

## 2013-12-04 DIAGNOSIS — J309 Allergic rhinitis, unspecified: Secondary | ICD-10-CM | POA: Diagnosis not present

## 2013-12-11 DIAGNOSIS — J309 Allergic rhinitis, unspecified: Secondary | ICD-10-CM | POA: Diagnosis not present

## 2013-12-18 DIAGNOSIS — J309 Allergic rhinitis, unspecified: Secondary | ICD-10-CM | POA: Diagnosis not present

## 2013-12-25 DIAGNOSIS — J309 Allergic rhinitis, unspecified: Secondary | ICD-10-CM | POA: Diagnosis not present

## 2014-01-01 DIAGNOSIS — J309 Allergic rhinitis, unspecified: Secondary | ICD-10-CM | POA: Diagnosis not present

## 2014-01-04 DIAGNOSIS — J309 Allergic rhinitis, unspecified: Secondary | ICD-10-CM | POA: Diagnosis not present

## 2014-01-08 DIAGNOSIS — J309 Allergic rhinitis, unspecified: Secondary | ICD-10-CM | POA: Diagnosis not present

## 2014-01-15 DIAGNOSIS — J309 Allergic rhinitis, unspecified: Secondary | ICD-10-CM | POA: Diagnosis not present

## 2014-01-19 DIAGNOSIS — J309 Allergic rhinitis, unspecified: Secondary | ICD-10-CM | POA: Diagnosis not present

## 2014-01-22 DIAGNOSIS — J309 Allergic rhinitis, unspecified: Secondary | ICD-10-CM | POA: Diagnosis not present

## 2014-01-26 DIAGNOSIS — J309 Allergic rhinitis, unspecified: Secondary | ICD-10-CM | POA: Diagnosis not present

## 2014-01-30 DIAGNOSIS — J309 Allergic rhinitis, unspecified: Secondary | ICD-10-CM | POA: Diagnosis not present

## 2014-02-05 DIAGNOSIS — J309 Allergic rhinitis, unspecified: Secondary | ICD-10-CM | POA: Diagnosis not present

## 2014-02-13 DIAGNOSIS — J309 Allergic rhinitis, unspecified: Secondary | ICD-10-CM | POA: Diagnosis not present

## 2014-02-19 DIAGNOSIS — J309 Allergic rhinitis, unspecified: Secondary | ICD-10-CM | POA: Diagnosis not present

## 2014-02-26 DIAGNOSIS — J309 Allergic rhinitis, unspecified: Secondary | ICD-10-CM | POA: Diagnosis not present

## 2014-03-05 DIAGNOSIS — J309 Allergic rhinitis, unspecified: Secondary | ICD-10-CM | POA: Diagnosis not present

## 2014-03-12 DIAGNOSIS — J309 Allergic rhinitis, unspecified: Secondary | ICD-10-CM | POA: Diagnosis not present

## 2014-03-19 DIAGNOSIS — J309 Allergic rhinitis, unspecified: Secondary | ICD-10-CM | POA: Diagnosis not present

## 2014-03-26 DIAGNOSIS — J309 Allergic rhinitis, unspecified: Secondary | ICD-10-CM | POA: Diagnosis not present

## 2014-04-02 DIAGNOSIS — J309 Allergic rhinitis, unspecified: Secondary | ICD-10-CM | POA: Diagnosis not present

## 2014-04-09 DIAGNOSIS — J309 Allergic rhinitis, unspecified: Secondary | ICD-10-CM | POA: Diagnosis not present

## 2014-04-16 DIAGNOSIS — J309 Allergic rhinitis, unspecified: Secondary | ICD-10-CM | POA: Diagnosis not present

## 2014-04-23 DIAGNOSIS — J309 Allergic rhinitis, unspecified: Secondary | ICD-10-CM | POA: Diagnosis not present

## 2014-04-25 DIAGNOSIS — J309 Allergic rhinitis, unspecified: Secondary | ICD-10-CM | POA: Diagnosis not present

## 2014-04-30 DIAGNOSIS — J309 Allergic rhinitis, unspecified: Secondary | ICD-10-CM | POA: Diagnosis not present

## 2014-05-07 DIAGNOSIS — J309 Allergic rhinitis, unspecified: Secondary | ICD-10-CM | POA: Diagnosis not present

## 2014-05-14 DIAGNOSIS — J309 Allergic rhinitis, unspecified: Secondary | ICD-10-CM | POA: Diagnosis not present

## 2014-05-21 DIAGNOSIS — J309 Allergic rhinitis, unspecified: Secondary | ICD-10-CM | POA: Diagnosis not present

## 2014-05-25 ENCOUNTER — Ambulatory Visit (INDEPENDENT_AMBULATORY_CARE_PROVIDER_SITE_OTHER): Payer: Medicare Other | Admitting: Internal Medicine

## 2014-05-25 ENCOUNTER — Encounter: Payer: Self-pay | Admitting: Internal Medicine

## 2014-05-25 ENCOUNTER — Other Ambulatory Visit (INDEPENDENT_AMBULATORY_CARE_PROVIDER_SITE_OTHER): Payer: Medicare Other

## 2014-05-25 VITALS — BP 118/82 | HR 63 | Temp 98.2°F | Ht 71.0 in | Wt 198.0 lb

## 2014-05-25 DIAGNOSIS — N320 Bladder-neck obstruction: Secondary | ICD-10-CM

## 2014-05-25 DIAGNOSIS — Z23 Encounter for immunization: Secondary | ICD-10-CM

## 2014-05-25 DIAGNOSIS — Z136 Encounter for screening for cardiovascular disorders: Secondary | ICD-10-CM | POA: Diagnosis not present

## 2014-05-25 DIAGNOSIS — E785 Hyperlipidemia, unspecified: Secondary | ICD-10-CM

## 2014-05-25 DIAGNOSIS — R7302 Impaired glucose tolerance (oral): Secondary | ICD-10-CM

## 2014-05-25 DIAGNOSIS — R21 Rash and other nonspecific skin eruption: Secondary | ICD-10-CM | POA: Diagnosis not present

## 2014-05-25 DIAGNOSIS — R7309 Other abnormal glucose: Secondary | ICD-10-CM

## 2014-05-25 LAB — URINALYSIS, ROUTINE W REFLEX MICROSCOPIC
Bilirubin Urine: NEGATIVE
Hgb urine dipstick: NEGATIVE
Ketones, ur: NEGATIVE
Leukocytes, UA: NEGATIVE
Nitrite: NEGATIVE
Specific Gravity, Urine: 1.025 (ref 1.000–1.030)
Total Protein, Urine: NEGATIVE
Urine Glucose: NEGATIVE
Urobilinogen, UA: 0.2 (ref 0.0–1.0)
pH: 5 (ref 5.0–8.0)

## 2014-05-25 LAB — CBC WITH DIFFERENTIAL/PLATELET
Basophils Absolute: 0 10*3/uL (ref 0.0–0.1)
Basophils Relative: 0.6 % (ref 0.0–3.0)
Eosinophils Absolute: 0.1 10*3/uL (ref 0.0–0.7)
Eosinophils Relative: 1.7 % (ref 0.0–5.0)
HCT: 48.8 % (ref 39.0–52.0)
Hemoglobin: 16.6 g/dL (ref 13.0–17.0)
Lymphocytes Relative: 34.2 % (ref 12.0–46.0)
Lymphs Abs: 2.5 10*3/uL (ref 0.7–4.0)
MCHC: 34 g/dL (ref 30.0–36.0)
MCV: 87.1 fl (ref 78.0–100.0)
Monocytes Absolute: 0.8 10*3/uL (ref 0.1–1.0)
Monocytes Relative: 10.1 % (ref 3.0–12.0)
Neutro Abs: 4 10*3/uL (ref 1.4–7.7)
Neutrophils Relative %: 53.4 % (ref 43.0–77.0)
Platelets: 206 10*3/uL (ref 150.0–400.0)
RBC: 5.6 Mil/uL (ref 4.22–5.81)
RDW: 12.4 % (ref 11.5–15.5)
WBC: 7.4 10*3/uL (ref 4.0–10.5)

## 2014-05-25 LAB — LIPID PANEL
Cholesterol: 169 mg/dL (ref 0–200)
HDL: 40.3 mg/dL (ref >39.00)
LDL Cholesterol: 97 mg/dL (ref 0–99)
NonHDL: 128.7
Total CHOL/HDL Ratio: 4
Triglycerides: 161 mg/dL — ABNORMAL HIGH (ref 0.0–149.0)
VLDL: 32.2 mg/dL (ref 0.0–40.0)

## 2014-05-25 LAB — HEPATIC FUNCTION PANEL
ALT: 29 U/L (ref 0–53)
AST: 23 U/L (ref 0–37)
Albumin: 4.2 g/dL (ref 3.5–5.2)
Alkaline Phosphatase: 57 U/L (ref 39–117)
Bilirubin, Direct: 0.1 mg/dL (ref 0.0–0.3)
Total Bilirubin: 1 mg/dL (ref 0.2–1.2)
Total Protein: 7.5 g/dL (ref 6.0–8.3)

## 2014-05-25 LAB — BASIC METABOLIC PANEL
BUN: 12 mg/dL (ref 6–23)
CO2: 28 mEq/L (ref 19–32)
Calcium: 9.4 mg/dL (ref 8.4–10.5)
Chloride: 104 mEq/L (ref 96–112)
Creatinine, Ser: 1.1 mg/dL (ref 0.4–1.5)
GFR: 68.72 mL/min (ref >60.00)
Glucose, Bld: 97 mg/dL (ref 70–99)
Potassium: 4.2 mEq/L (ref 3.5–5.1)
Sodium: 139 mEq/L (ref 135–145)

## 2014-05-25 LAB — PSA: PSA: 1.39 ng/mL (ref 0.10–4.00)

## 2014-05-25 LAB — TSH: TSH: 0.43 u[IU]/mL (ref 0.35–4.50)

## 2014-05-25 MED ORDER — TRIAMCINOLONE ACETONIDE 0.1 % EX CREA
1.0000 | TOPICAL_CREAM | Freq: Two times a day (BID) | CUTANEOUS | Status: DC
Start: 2014-05-25 — End: 2020-06-20

## 2014-05-25 NOTE — Assessment & Plan Note (Addendum)
ECG reviewed as per emr, stable overall by history and exam, recent data reviewed with pt, and pt to continue medical treatment as before,  to f/u any worsening symptoms or concerns Lab Results  Component Value Date   LDLCALC 97 05/25/2014

## 2014-05-25 NOTE — Patient Instructions (Addendum)
You had the flu shot today  Please return in 2 wks for a Nurse Visit for the new Prevnar pneumonia shot  Please continue all other medications as before, and refills have been done if requested.  Please have the pharmacy call with any other refills you may need.  Please continue your efforts at being more active, low cholesterol diet, and weight control.  You are otherwise up to date with prevention measures today.  Please keep your appointments with your specialists as you may have planned  Please go to the LAB in the Basement (turn left off the elevator) for the tests to be done today  You will be contacted by phone if any changes need to be made immediately.  Otherwise, you will receive a letter about your results with an explanation, but please check with MyChart first.  Please return in 1 year for your yearly visit, or sooner if needed

## 2014-05-25 NOTE — Progress Notes (Signed)
Subjective:    Patient ID: Charles Lawson, male    DOB: [birth year], 73 y.o.   MRN: [id number]  HPI   Here to f/u; overall doing ok,  Pt denies chest pain, increased sob or doe, wheezing, orthopnea, PND, increased LE swelling, palpitations, dizziness or syncope.  Pt denies polydipsia, polyuria, or low sugar symptoms such as weakness or confusion improved with po intake.  Pt denies new neurological symptoms such as new headache, or facial or extremity weakness or numbness.   Pt states overall good compliance with meds, has been trying to follow lower cholesterol diet, with wt overall stable,  but little exercise however. Does have small itchy rash to prox medial right thigh, nontender, no fever or drainage. Past Medical History  Diagnosis Date  . 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 Allergic rhinitis, cause unspecified   . Arthritis   . Hyperlipidemia   . HYPERLIPIDEMIA 01/30/2010    Qualifier: Diagnosis of  By: Joyce Gross    . Personal history of colonic polyps 06/07/2012  . Impaired glucose tolerance 04/03/2013   Past Surgical History  Procedure Laterality Date  . Back surgery    . Herniorrhaphy right    . Spine surgery      lumbar laminectomy  . Hernia repair    . Lumbar laminectomy    . Basal cell carcinoma excision    . Colonoscopy      reports that he has never smoked. He has never used smokeless tobacco. He reports that he drinks alcohol. He reports that he does not use illicit drugs. family history includes COPD in his mother; Cancer in his father; Lung cancer in his father. There is no history of Colon cancer. Allergies  Allergen Reactions  . Penicillins   . Viagra [Sildenafil Citrate]    Current Outpatient Prescriptions on File Prior to Visit  Medication Sig Dispense Refill  . aspirin 81 MG tablet Take  1 tablet (81 mg total) by mouth daily.  30 tablet  11   No current facility-administered medications on file prior to visit.   Review of Systems  Constitutional: Negative for unusual diaphoresis or other sweats  HENT: Negative for ringing in ear Eyes: Negative for double vision or worsening visual disturbance.  Respiratory: Negative for choking and stridor.   Gastrointestinal: Negative for vomiting or other signifcant bowel change Genitourinary: Negative for hematuria or decreased urine volume.  Musculoskeletal: Negative for other MSK pain or swelling Skin: Negative for color change and worsening wound.  Neurological: Negative for tremors and numbness other than noted  Psychiatric/Behavioral: Negative for decreased concentration or agitation other than above       Objective:   Physical Exam BP 118/82  Pulse 63  Temp(Src) 98.2 F (36.8 C) (Oral)  Ht 5\' 11"  (1.803 m)  Wt 198 lb (89.812 kg)  BMI 27.63 kg/m2  SpO2 97% VS noted,  Constitutional: Pt appears well-developed, well-nourished.  HENT: Head: NCAT.  Right Ear: External ear normal.  Left Ear: External ear normal.  Eyes: . Pupils are equal, round, and reactive to light. Conjunctivae and EOM are normal Neck: Normal range of motion. Neck supple.  Cardiovascular: Normal rate and regular rhythm.   Pulmonary/Chest: Effort normal and breath sounds normal.  Abd:  Soft, NT, ND, + BS Neurological: Pt  is alert. Not confused , motor grossly intact Skin: Skin is warm. 1.5 cm area mild scalyl erythema to right prox medial thigh Psychiatric: Pt behavior is normal. No agitation.     Assessment & Plan:

## 2014-05-25 NOTE — Progress Notes (Signed)
Pre visit review using our clinic review tool, if applicable. No additional management support is needed unless otherwise documented below in the visit note. 

## 2014-05-26 NOTE — Assessment & Plan Note (Signed)
C/w mild dermatitis, for triam cr prn,  to f/u any worsening symptoms or concerns 

## 2014-05-26 NOTE — Assessment & Plan Note (Signed)
stable overall by history and exam, recent data reviewed with pt, and pt to continue medical treatment as before,  to f/u any worsening symptoms or concerns Lab Results  Component Value Date   HGBA1C 5.7 04/03/2013

## 2014-05-26 NOTE — Assessment & Plan Note (Signed)
Asympt, also for psa as he is due,  to f/u any worsening symptoms or concerns

## 2014-05-29 DIAGNOSIS — J309 Allergic rhinitis, unspecified: Secondary | ICD-10-CM | POA: Diagnosis not present

## 2014-05-31 DIAGNOSIS — J309 Allergic rhinitis, unspecified: Secondary | ICD-10-CM | POA: Diagnosis not present

## 2014-06-04 DIAGNOSIS — J309 Allergic rhinitis, unspecified: Secondary | ICD-10-CM | POA: Diagnosis not present

## 2014-06-07 DIAGNOSIS — J309 Allergic rhinitis, unspecified: Secondary | ICD-10-CM | POA: Diagnosis not present

## 2014-06-08 ENCOUNTER — Ambulatory Visit (INDEPENDENT_AMBULATORY_CARE_PROVIDER_SITE_OTHER): Payer: Medicare Other

## 2014-06-08 DIAGNOSIS — Z23 Encounter for immunization: Secondary | ICD-10-CM | POA: Diagnosis not present

## 2014-06-12 DIAGNOSIS — J309 Allergic rhinitis, unspecified: Secondary | ICD-10-CM | POA: Diagnosis not present

## 2014-06-18 DIAGNOSIS — J309 Allergic rhinitis, unspecified: Secondary | ICD-10-CM | POA: Diagnosis not present

## 2014-06-25 DIAGNOSIS — J3081 Allergic rhinitis due to animal (cat) (dog) hair and dander: Secondary | ICD-10-CM | POA: Diagnosis not present

## 2014-06-25 DIAGNOSIS — J301 Allergic rhinitis due to pollen: Secondary | ICD-10-CM | POA: Diagnosis not present

## 2014-06-25 DIAGNOSIS — J3089 Other allergic rhinitis: Secondary | ICD-10-CM | POA: Diagnosis not present

## 2014-07-02 DIAGNOSIS — J3089 Other allergic rhinitis: Secondary | ICD-10-CM | POA: Diagnosis not present

## 2014-07-02 DIAGNOSIS — J3081 Allergic rhinitis due to animal (cat) (dog) hair and dander: Secondary | ICD-10-CM | POA: Diagnosis not present

## 2014-07-02 DIAGNOSIS — J301 Allergic rhinitis due to pollen: Secondary | ICD-10-CM | POA: Diagnosis not present

## 2014-07-09 DIAGNOSIS — J3081 Allergic rhinitis due to animal (cat) (dog) hair and dander: Secondary | ICD-10-CM | POA: Diagnosis not present

## 2014-07-09 DIAGNOSIS — J301 Allergic rhinitis due to pollen: Secondary | ICD-10-CM | POA: Diagnosis not present

## 2014-07-09 DIAGNOSIS — J3089 Other allergic rhinitis: Secondary | ICD-10-CM | POA: Diagnosis not present

## 2014-07-16 DIAGNOSIS — J3089 Other allergic rhinitis: Secondary | ICD-10-CM | POA: Diagnosis not present

## 2014-07-16 DIAGNOSIS — J3081 Allergic rhinitis due to animal (cat) (dog) hair and dander: Secondary | ICD-10-CM | POA: Diagnosis not present

## 2014-07-16 DIAGNOSIS — J301 Allergic rhinitis due to pollen: Secondary | ICD-10-CM | POA: Diagnosis not present

## 2014-07-23 DIAGNOSIS — J3089 Other allergic rhinitis: Secondary | ICD-10-CM | POA: Diagnosis not present

## 2014-07-23 DIAGNOSIS — J301 Allergic rhinitis due to pollen: Secondary | ICD-10-CM | POA: Diagnosis not present

## 2014-07-23 DIAGNOSIS — J3081 Allergic rhinitis due to animal (cat) (dog) hair and dander: Secondary | ICD-10-CM | POA: Diagnosis not present

## 2014-07-31 DIAGNOSIS — J3089 Other allergic rhinitis: Secondary | ICD-10-CM | POA: Diagnosis not present

## 2014-07-31 DIAGNOSIS — J3081 Allergic rhinitis due to animal (cat) (dog) hair and dander: Secondary | ICD-10-CM | POA: Diagnosis not present

## 2014-07-31 DIAGNOSIS — J301 Allergic rhinitis due to pollen: Secondary | ICD-10-CM | POA: Diagnosis not present

## 2014-08-07 DIAGNOSIS — J3081 Allergic rhinitis due to animal (cat) (dog) hair and dander: Secondary | ICD-10-CM | POA: Diagnosis not present

## 2014-08-07 DIAGNOSIS — J3089 Other allergic rhinitis: Secondary | ICD-10-CM | POA: Diagnosis not present

## 2014-08-07 DIAGNOSIS — J301 Allergic rhinitis due to pollen: Secondary | ICD-10-CM | POA: Diagnosis not present

## 2014-08-13 DIAGNOSIS — J3081 Allergic rhinitis due to animal (cat) (dog) hair and dander: Secondary | ICD-10-CM | POA: Diagnosis not present

## 2014-08-13 DIAGNOSIS — J301 Allergic rhinitis due to pollen: Secondary | ICD-10-CM | POA: Diagnosis not present

## 2014-08-13 DIAGNOSIS — J3089 Other allergic rhinitis: Secondary | ICD-10-CM | POA: Diagnosis not present

## 2014-08-20 DIAGNOSIS — J301 Allergic rhinitis due to pollen: Secondary | ICD-10-CM | POA: Diagnosis not present

## 2014-08-20 DIAGNOSIS — J3081 Allergic rhinitis due to animal (cat) (dog) hair and dander: Secondary | ICD-10-CM | POA: Diagnosis not present

## 2014-08-20 DIAGNOSIS — J3089 Other allergic rhinitis: Secondary | ICD-10-CM | POA: Diagnosis not present

## 2014-08-28 DIAGNOSIS — J301 Allergic rhinitis due to pollen: Secondary | ICD-10-CM | POA: Diagnosis not present

## 2014-08-28 DIAGNOSIS — J3089 Other allergic rhinitis: Secondary | ICD-10-CM | POA: Diagnosis not present

## 2014-08-28 DIAGNOSIS — J3081 Allergic rhinitis due to animal (cat) (dog) hair and dander: Secondary | ICD-10-CM | POA: Diagnosis not present

## 2014-09-03 DIAGNOSIS — J3081 Allergic rhinitis due to animal (cat) (dog) hair and dander: Secondary | ICD-10-CM | POA: Diagnosis not present

## 2014-09-03 DIAGNOSIS — J301 Allergic rhinitis due to pollen: Secondary | ICD-10-CM | POA: Diagnosis not present

## 2014-09-03 DIAGNOSIS — J3089 Other allergic rhinitis: Secondary | ICD-10-CM | POA: Diagnosis not present

## 2014-09-10 DIAGNOSIS — J301 Allergic rhinitis due to pollen: Secondary | ICD-10-CM | POA: Diagnosis not present

## 2014-09-10 DIAGNOSIS — J3081 Allergic rhinitis due to animal (cat) (dog) hair and dander: Secondary | ICD-10-CM | POA: Diagnosis not present

## 2014-09-10 DIAGNOSIS — J3089 Other allergic rhinitis: Secondary | ICD-10-CM | POA: Diagnosis not present

## 2014-09-17 DIAGNOSIS — J3089 Other allergic rhinitis: Secondary | ICD-10-CM | POA: Diagnosis not present

## 2014-09-17 DIAGNOSIS — J3081 Allergic rhinitis due to animal (cat) (dog) hair and dander: Secondary | ICD-10-CM | POA: Diagnosis not present

## 2014-09-17 DIAGNOSIS — J301 Allergic rhinitis due to pollen: Secondary | ICD-10-CM | POA: Diagnosis not present

## 2014-09-24 DIAGNOSIS — J301 Allergic rhinitis due to pollen: Secondary | ICD-10-CM | POA: Diagnosis not present

## 2014-09-24 DIAGNOSIS — J3089 Other allergic rhinitis: Secondary | ICD-10-CM | POA: Diagnosis not present

## 2014-09-24 DIAGNOSIS — J3081 Allergic rhinitis due to animal (cat) (dog) hair and dander: Secondary | ICD-10-CM | POA: Diagnosis not present

## 2014-09-25 DIAGNOSIS — J3089 Other allergic rhinitis: Secondary | ICD-10-CM | POA: Diagnosis not present

## 2014-09-25 DIAGNOSIS — J301 Allergic rhinitis due to pollen: Secondary | ICD-10-CM | POA: Diagnosis not present

## 2014-09-25 DIAGNOSIS — J3081 Allergic rhinitis due to animal (cat) (dog) hair and dander: Secondary | ICD-10-CM | POA: Diagnosis not present

## 2014-10-01 DIAGNOSIS — J301 Allergic rhinitis due to pollen: Secondary | ICD-10-CM | POA: Diagnosis not present

## 2014-10-01 DIAGNOSIS — J3081 Allergic rhinitis due to animal (cat) (dog) hair and dander: Secondary | ICD-10-CM | POA: Diagnosis not present

## 2014-10-01 DIAGNOSIS — J3089 Other allergic rhinitis: Secondary | ICD-10-CM | POA: Diagnosis not present

## 2014-10-08 DIAGNOSIS — J3081 Allergic rhinitis due to animal (cat) (dog) hair and dander: Secondary | ICD-10-CM | POA: Diagnosis not present

## 2014-10-08 DIAGNOSIS — J3089 Other allergic rhinitis: Secondary | ICD-10-CM | POA: Diagnosis not present

## 2014-10-08 DIAGNOSIS — J301 Allergic rhinitis due to pollen: Secondary | ICD-10-CM | POA: Diagnosis not present

## 2014-10-23 DIAGNOSIS — J3081 Allergic rhinitis due to animal (cat) (dog) hair and dander: Secondary | ICD-10-CM | POA: Diagnosis not present

## 2014-10-23 DIAGNOSIS — J3089 Other allergic rhinitis: Secondary | ICD-10-CM | POA: Diagnosis not present

## 2014-10-23 DIAGNOSIS — J301 Allergic rhinitis due to pollen: Secondary | ICD-10-CM | POA: Diagnosis not present

## 2014-10-29 DIAGNOSIS — J3089 Other allergic rhinitis: Secondary | ICD-10-CM | POA: Diagnosis not present

## 2014-10-29 DIAGNOSIS — J301 Allergic rhinitis due to pollen: Secondary | ICD-10-CM | POA: Diagnosis not present

## 2014-10-29 DIAGNOSIS — J3081 Allergic rhinitis due to animal (cat) (dog) hair and dander: Secondary | ICD-10-CM | POA: Diagnosis not present

## 2014-11-06 DIAGNOSIS — J3089 Other allergic rhinitis: Secondary | ICD-10-CM | POA: Diagnosis not present

## 2014-11-06 DIAGNOSIS — J3081 Allergic rhinitis due to animal (cat) (dog) hair and dander: Secondary | ICD-10-CM | POA: Diagnosis not present

## 2014-11-06 DIAGNOSIS — J301 Allergic rhinitis due to pollen: Secondary | ICD-10-CM | POA: Diagnosis not present

## 2014-11-12 DIAGNOSIS — J301 Allergic rhinitis due to pollen: Secondary | ICD-10-CM | POA: Diagnosis not present

## 2014-11-12 DIAGNOSIS — J3081 Allergic rhinitis due to animal (cat) (dog) hair and dander: Secondary | ICD-10-CM | POA: Diagnosis not present

## 2014-11-12 DIAGNOSIS — J3089 Other allergic rhinitis: Secondary | ICD-10-CM | POA: Diagnosis not present

## 2014-11-19 DIAGNOSIS — J3081 Allergic rhinitis due to animal (cat) (dog) hair and dander: Secondary | ICD-10-CM | POA: Diagnosis not present

## 2014-11-19 DIAGNOSIS — J3089 Other allergic rhinitis: Secondary | ICD-10-CM | POA: Diagnosis not present

## 2014-11-19 DIAGNOSIS — J301 Allergic rhinitis due to pollen: Secondary | ICD-10-CM | POA: Diagnosis not present

## 2014-11-26 DIAGNOSIS — J301 Allergic rhinitis due to pollen: Secondary | ICD-10-CM | POA: Diagnosis not present

## 2014-11-26 DIAGNOSIS — J3081 Allergic rhinitis due to animal (cat) (dog) hair and dander: Secondary | ICD-10-CM | POA: Diagnosis not present

## 2014-11-26 DIAGNOSIS — J3089 Other allergic rhinitis: Secondary | ICD-10-CM | POA: Diagnosis not present

## 2014-12-03 DIAGNOSIS — J301 Allergic rhinitis due to pollen: Secondary | ICD-10-CM | POA: Diagnosis not present

## 2014-12-03 DIAGNOSIS — J3089 Other allergic rhinitis: Secondary | ICD-10-CM | POA: Diagnosis not present

## 2014-12-03 DIAGNOSIS — J3081 Allergic rhinitis due to animal (cat) (dog) hair and dander: Secondary | ICD-10-CM | POA: Diagnosis not present

## 2014-12-10 DIAGNOSIS — J3081 Allergic rhinitis due to animal (cat) (dog) hair and dander: Secondary | ICD-10-CM | POA: Diagnosis not present

## 2014-12-10 DIAGNOSIS — J301 Allergic rhinitis due to pollen: Secondary | ICD-10-CM | POA: Diagnosis not present

## 2014-12-10 DIAGNOSIS — J3089 Other allergic rhinitis: Secondary | ICD-10-CM | POA: Diagnosis not present

## 2014-12-17 DIAGNOSIS — J301 Allergic rhinitis due to pollen: Secondary | ICD-10-CM | POA: Diagnosis not present

## 2014-12-17 DIAGNOSIS — J3089 Other allergic rhinitis: Secondary | ICD-10-CM | POA: Diagnosis not present

## 2014-12-25 DIAGNOSIS — J301 Allergic rhinitis due to pollen: Secondary | ICD-10-CM | POA: Diagnosis not present

## 2014-12-25 DIAGNOSIS — J3081 Allergic rhinitis due to animal (cat) (dog) hair and dander: Secondary | ICD-10-CM | POA: Diagnosis not present

## 2014-12-25 DIAGNOSIS — J3089 Other allergic rhinitis: Secondary | ICD-10-CM | POA: Diagnosis not present

## 2014-12-31 DIAGNOSIS — J3081 Allergic rhinitis due to animal (cat) (dog) hair and dander: Secondary | ICD-10-CM | POA: Diagnosis not present

## 2014-12-31 DIAGNOSIS — J301 Allergic rhinitis due to pollen: Secondary | ICD-10-CM | POA: Diagnosis not present

## 2014-12-31 DIAGNOSIS — J3089 Other allergic rhinitis: Secondary | ICD-10-CM | POA: Diagnosis not present

## 2015-01-07 DIAGNOSIS — J3089 Other allergic rhinitis: Secondary | ICD-10-CM | POA: Diagnosis not present

## 2015-01-07 DIAGNOSIS — J3081 Allergic rhinitis due to animal (cat) (dog) hair and dander: Secondary | ICD-10-CM | POA: Diagnosis not present

## 2015-01-07 DIAGNOSIS — J301 Allergic rhinitis due to pollen: Secondary | ICD-10-CM | POA: Diagnosis not present

## 2015-01-14 DIAGNOSIS — J301 Allergic rhinitis due to pollen: Secondary | ICD-10-CM | POA: Diagnosis not present

## 2015-01-14 DIAGNOSIS — J3081 Allergic rhinitis due to animal (cat) (dog) hair and dander: Secondary | ICD-10-CM | POA: Diagnosis not present

## 2015-01-14 DIAGNOSIS — J3089 Other allergic rhinitis: Secondary | ICD-10-CM | POA: Diagnosis not present

## 2015-01-21 DIAGNOSIS — J3089 Other allergic rhinitis: Secondary | ICD-10-CM | POA: Diagnosis not present

## 2015-01-21 DIAGNOSIS — J301 Allergic rhinitis due to pollen: Secondary | ICD-10-CM | POA: Diagnosis not present

## 2015-01-21 DIAGNOSIS — J3081 Allergic rhinitis due to animal (cat) (dog) hair and dander: Secondary | ICD-10-CM | POA: Diagnosis not present

## 2015-01-30 DIAGNOSIS — J3081 Allergic rhinitis due to animal (cat) (dog) hair and dander: Secondary | ICD-10-CM | POA: Diagnosis not present

## 2015-01-30 DIAGNOSIS — J3089 Other allergic rhinitis: Secondary | ICD-10-CM | POA: Diagnosis not present

## 2015-01-30 DIAGNOSIS — J301 Allergic rhinitis due to pollen: Secondary | ICD-10-CM | POA: Diagnosis not present

## 2015-02-05 DIAGNOSIS — J3081 Allergic rhinitis due to animal (cat) (dog) hair and dander: Secondary | ICD-10-CM | POA: Diagnosis not present

## 2015-02-05 DIAGNOSIS — J3089 Other allergic rhinitis: Secondary | ICD-10-CM | POA: Diagnosis not present

## 2015-02-05 DIAGNOSIS — J301 Allergic rhinitis due to pollen: Secondary | ICD-10-CM | POA: Diagnosis not present

## 2015-02-12 DIAGNOSIS — J3089 Other allergic rhinitis: Secondary | ICD-10-CM | POA: Diagnosis not present

## 2015-02-12 DIAGNOSIS — J3081 Allergic rhinitis due to animal (cat) (dog) hair and dander: Secondary | ICD-10-CM | POA: Diagnosis not present

## 2015-02-12 DIAGNOSIS — J301 Allergic rhinitis due to pollen: Secondary | ICD-10-CM | POA: Diagnosis not present

## 2015-02-19 DIAGNOSIS — J3081 Allergic rhinitis due to animal (cat) (dog) hair and dander: Secondary | ICD-10-CM | POA: Diagnosis not present

## 2015-02-19 DIAGNOSIS — J301 Allergic rhinitis due to pollen: Secondary | ICD-10-CM | POA: Diagnosis not present

## 2015-02-19 DIAGNOSIS — J3089 Other allergic rhinitis: Secondary | ICD-10-CM | POA: Diagnosis not present

## 2015-02-25 DIAGNOSIS — J3081 Allergic rhinitis due to animal (cat) (dog) hair and dander: Secondary | ICD-10-CM | POA: Diagnosis not present

## 2015-02-25 DIAGNOSIS — J3089 Other allergic rhinitis: Secondary | ICD-10-CM | POA: Diagnosis not present

## 2015-02-25 DIAGNOSIS — J301 Allergic rhinitis due to pollen: Secondary | ICD-10-CM | POA: Diagnosis not present

## 2015-03-04 DIAGNOSIS — J3089 Other allergic rhinitis: Secondary | ICD-10-CM | POA: Diagnosis not present

## 2015-03-04 DIAGNOSIS — J301 Allergic rhinitis due to pollen: Secondary | ICD-10-CM | POA: Diagnosis not present

## 2015-03-11 DIAGNOSIS — J3089 Other allergic rhinitis: Secondary | ICD-10-CM | POA: Diagnosis not present

## 2015-03-11 DIAGNOSIS — J301 Allergic rhinitis due to pollen: Secondary | ICD-10-CM | POA: Diagnosis not present

## 2015-03-13 DIAGNOSIS — J3081 Allergic rhinitis due to animal (cat) (dog) hair and dander: Secondary | ICD-10-CM | POA: Diagnosis not present

## 2015-03-13 DIAGNOSIS — J301 Allergic rhinitis due to pollen: Secondary | ICD-10-CM | POA: Diagnosis not present

## 2015-03-13 DIAGNOSIS — J3089 Other allergic rhinitis: Secondary | ICD-10-CM | POA: Diagnosis not present

## 2015-03-18 DIAGNOSIS — J3089 Other allergic rhinitis: Secondary | ICD-10-CM | POA: Diagnosis not present

## 2015-03-18 DIAGNOSIS — J301 Allergic rhinitis due to pollen: Secondary | ICD-10-CM | POA: Diagnosis not present

## 2015-03-26 DIAGNOSIS — J3089 Other allergic rhinitis: Secondary | ICD-10-CM | POA: Diagnosis not present

## 2015-03-26 DIAGNOSIS — J301 Allergic rhinitis due to pollen: Secondary | ICD-10-CM | POA: Diagnosis not present

## 2015-04-01 DIAGNOSIS — J301 Allergic rhinitis due to pollen: Secondary | ICD-10-CM | POA: Diagnosis not present

## 2015-04-01 DIAGNOSIS — J3081 Allergic rhinitis due to animal (cat) (dog) hair and dander: Secondary | ICD-10-CM | POA: Diagnosis not present

## 2015-04-01 DIAGNOSIS — J3089 Other allergic rhinitis: Secondary | ICD-10-CM | POA: Diagnosis not present

## 2015-04-05 ENCOUNTER — Other Ambulatory Visit: Payer: Self-pay

## 2015-04-05 MED ORDER — SILDENAFIL CITRATE 20 MG PO TABS: 50.0000 mg | ORAL_TABLET | Freq: Three times a day (TID) | ORAL | Status: DC

## 2015-04-08 DIAGNOSIS — J3089 Other allergic rhinitis: Secondary | ICD-10-CM | POA: Diagnosis not present

## 2015-04-08 DIAGNOSIS — J301 Allergic rhinitis due to pollen: Secondary | ICD-10-CM | POA: Diagnosis not present

## 2015-04-08 DIAGNOSIS — J3081 Allergic rhinitis due to animal (cat) (dog) hair and dander: Secondary | ICD-10-CM | POA: Diagnosis not present

## 2015-04-15 DIAGNOSIS — J3089 Other allergic rhinitis: Secondary | ICD-10-CM | POA: Diagnosis not present

## 2015-04-15 DIAGNOSIS — J301 Allergic rhinitis due to pollen: Secondary | ICD-10-CM | POA: Diagnosis not present

## 2015-04-22 DIAGNOSIS — J3089 Other allergic rhinitis: Secondary | ICD-10-CM | POA: Diagnosis not present

## 2015-04-22 DIAGNOSIS — J301 Allergic rhinitis due to pollen: Secondary | ICD-10-CM | POA: Diagnosis not present

## 2015-04-29 DIAGNOSIS — J3089 Other allergic rhinitis: Secondary | ICD-10-CM | POA: Diagnosis not present

## 2015-04-29 DIAGNOSIS — J3081 Allergic rhinitis due to animal (cat) (dog) hair and dander: Secondary | ICD-10-CM | POA: Diagnosis not present

## 2015-04-29 DIAGNOSIS — J301 Allergic rhinitis due to pollen: Secondary | ICD-10-CM | POA: Diagnosis not present

## 2015-05-06 DIAGNOSIS — J3089 Other allergic rhinitis: Secondary | ICD-10-CM | POA: Diagnosis not present

## 2015-05-06 DIAGNOSIS — J301 Allergic rhinitis due to pollen: Secondary | ICD-10-CM | POA: Diagnosis not present

## 2015-05-13 DIAGNOSIS — J3081 Allergic rhinitis due to animal (cat) (dog) hair and dander: Secondary | ICD-10-CM | POA: Diagnosis not present

## 2015-05-13 DIAGNOSIS — J3089 Other allergic rhinitis: Secondary | ICD-10-CM | POA: Diagnosis not present

## 2015-05-13 DIAGNOSIS — J301 Allergic rhinitis due to pollen: Secondary | ICD-10-CM | POA: Diagnosis not present

## 2015-05-20 DIAGNOSIS — J301 Allergic rhinitis due to pollen: Secondary | ICD-10-CM | POA: Diagnosis not present

## 2015-05-20 DIAGNOSIS — J3081 Allergic rhinitis due to animal (cat) (dog) hair and dander: Secondary | ICD-10-CM | POA: Diagnosis not present

## 2015-05-20 DIAGNOSIS — J3089 Other allergic rhinitis: Secondary | ICD-10-CM | POA: Diagnosis not present

## 2015-05-28 ENCOUNTER — Encounter: Payer: Self-pay | Admitting: Internal Medicine

## 2015-05-28 ENCOUNTER — Ambulatory Visit (INDEPENDENT_AMBULATORY_CARE_PROVIDER_SITE_OTHER): Payer: Medicare Other | Admitting: Internal Medicine

## 2015-05-28 ENCOUNTER — Other Ambulatory Visit (INDEPENDENT_AMBULATORY_CARE_PROVIDER_SITE_OTHER): Payer: Medicare Other

## 2015-05-28 VITALS — BP 122/76 | HR 63 | Temp 98.3°F | Ht 71.0 in | Wt 197.0 lb

## 2015-05-28 DIAGNOSIS — E785 Hyperlipidemia, unspecified: Secondary | ICD-10-CM

## 2015-05-28 DIAGNOSIS — J309 Allergic rhinitis, unspecified: Secondary | ICD-10-CM | POA: Diagnosis not present

## 2015-05-28 DIAGNOSIS — Z23 Encounter for immunization: Secondary | ICD-10-CM | POA: Diagnosis not present

## 2015-05-28 DIAGNOSIS — R7302 Impaired glucose tolerance (oral): Secondary | ICD-10-CM | POA: Diagnosis not present

## 2015-05-28 DIAGNOSIS — N320 Bladder-neck obstruction: Secondary | ICD-10-CM

## 2015-05-28 DIAGNOSIS — F528 Other sexual dysfunction not due to a substance or known physiological condition: Secondary | ICD-10-CM

## 2015-05-28 DIAGNOSIS — Z0000 Encounter for general adult medical examination without abnormal findings: Secondary | ICD-10-CM

## 2015-05-28 LAB — CBC WITH DIFFERENTIAL/PLATELET
Basophils Absolute: 0.1 10*3/uL (ref 0.0–0.1)
Basophils Relative: 0.8 % (ref 0.0–3.0)
Eosinophils Absolute: 0.2 10*3/uL (ref 0.0–0.7)
Eosinophils Relative: 2.2 % (ref 0.0–5.0)
HCT: 50.1 % (ref 39.0–52.0)
Hemoglobin: 17.1 g/dL — ABNORMAL HIGH (ref 13.0–17.0)
Lymphocytes Relative: 32.8 % (ref 12.0–46.0)
Lymphs Abs: 2.4 10*3/uL (ref 0.7–4.0)
MCHC: 34.3 g/dL (ref 30.0–36.0)
MCV: 87.2 fl (ref 78.0–100.0)
Monocytes Absolute: 0.7 10*3/uL (ref 0.1–1.0)
Monocytes Relative: 10 % (ref 3.0–12.0)
Neutro Abs: 3.9 10*3/uL (ref 1.4–7.7)
Neutrophils Relative %: 54.2 % (ref 43.0–77.0)
Platelets: 193 10*3/uL (ref 150.0–400.0)
RBC: 5.74 Mil/uL (ref 4.22–5.81)
RDW: 12.8 % (ref 11.5–15.5)
WBC: 7.2 10*3/uL (ref 4.0–10.5)

## 2015-05-28 LAB — LIPID PANEL
Cholesterol: 175 mg/dL (ref 0–200)
HDL: 45.2 mg/dL (ref >39.00)
LDL Cholesterol: 95 mg/dL (ref 0–99)
NonHDL: 129.8
Total CHOL/HDL Ratio: 4
Triglycerides: 172 mg/dL — ABNORMAL HIGH (ref 0.0–149.0)
VLDL: 34.4 mg/dL (ref 0.0–40.0)

## 2015-05-28 LAB — URINALYSIS, ROUTINE W REFLEX MICROSCOPIC
Bilirubin Urine: NEGATIVE
Hgb urine dipstick: NEGATIVE
Ketones, ur: NEGATIVE
Leukocytes, UA: NEGATIVE
Nitrite: NEGATIVE
RBC / HPF: NONE SEEN (ref 0–?)
Specific Gravity, Urine: 1.025 (ref 1.000–1.030)
Total Protein, Urine: NEGATIVE
Urine Glucose: NEGATIVE
Urobilinogen, UA: 0.2 (ref 0.0–1.0)
WBC, UA: NONE SEEN (ref 0–?)
pH: 6 (ref 5.0–8.0)

## 2015-05-28 LAB — PSA: PSA: 1.76 ng/mL (ref 0.10–4.00)

## 2015-05-28 LAB — HEPATIC FUNCTION PANEL
ALT: 32 U/L (ref 0–53)
AST: 21 U/L (ref 0–37)
Albumin: 4.6 g/dL (ref 3.5–5.2)
Alkaline Phosphatase: 57 U/L (ref 39–117)
Bilirubin, Direct: 0.2 mg/dL (ref 0.0–0.3)
Total Bilirubin: 1 mg/dL (ref 0.2–1.2)
Total Protein: 7.2 g/dL (ref 6.0–8.3)

## 2015-05-28 LAB — BASIC METABOLIC PANEL
BUN: 11 mg/dL (ref 6–23)
CO2: 30 mEq/L (ref 19–32)
Calcium: 9.8 mg/dL (ref 8.4–10.5)
Chloride: 103 mEq/L (ref 96–112)
Creatinine, Ser: 1.02 mg/dL (ref 0.40–1.50)
GFR: 76.34 mL/min (ref >60.00)
Glucose, Bld: 101 mg/dL — ABNORMAL HIGH (ref 70–99)
Potassium: 4.1 mEq/L (ref 3.5–5.1)
Sodium: 141 mEq/L (ref 135–145)

## 2015-05-28 LAB — HEMOGLOBIN A1C: Hgb A1c MFr Bld: 5.2 % (ref 4.6–6.5)

## 2015-05-28 LAB — TSH: TSH: 1.33 u[IU]/mL (ref 0.35–4.50)

## 2015-05-28 MED ORDER — ENSURE PLUS PO LIQD: 1.0000 | Freq: Two times a day (BID) | ORAL | Status: DC

## 2015-05-28 NOTE — Assessment & Plan Note (Signed)
stable overall by history and exam, recent data reviewed with pt, and pt to continue medical treatment as before,  to f/u any worsening symptoms or concerns Lab Results  Component Value Date   HGBA1C 5.7 04/03/2013

## 2015-05-28 NOTE — Progress Notes (Signed)
Subjective:    Patient ID: Charles Lawson, male    DOB: Aug 21, 1941, 74 y.o.   MRN: [id number]  HPI  Here for yearly f/u;  Overall doing ok;  Pt denies Chest pain, worsening SOB, DOE, wheezing, orthopnea, PND, worsening LE edema, palpitations, dizziness or syncope.  Pt denies neurological change such as new headache, facial or extremity weakness.  Pt denies polydipsia, polyuria, or low sugar symptoms. Pt states overall good compliance with treatment and medications, good tolerability, and has been trying to follow appropriate diet.  Pt denies worsening depressive symptoms, suicidal ideation or panic. No fever, night sweats, wt loss, loss of appetite, or other constitutional symptoms.  Pt states good ability with ADL's, has low fall risk, home safety reviewed and adequate, no other significant changes in hearing or vision, and occasionally active with exercise with swimming at the gym. No new complaints.  Does have several wks mild ongoing nasal allergy symptoms with clearish congestion, itch and sneezing, without fever, pain, ST, cough, swelling or wheezing, but better with OTC meds.  Denies urinary symptoms such as dysuria, frequency, urgency, flank pain, hematuria or n/v, fever, chills.  ED med working well.   Past Medical History  Diagnosis Date  . 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 Allergic rhinitis, cause unspecified   . Arthritis   . Hyperlipidemia   . HYPERLIPIDEMIA 01/30/2010    Qualifier: Diagnosis of  By: Joyce Gross    . Personal history of colonic polyps 06/07/2012  . Impaired glucose tolerance 04/03/2013   Past Surgical History  Procedure Laterality Date  . Back surgery    . Herniorrhaphy right    . Spine surgery      lumbar laminectomy  . Hernia repair    . Lumbar laminectomy    . Basal cell carcinoma  excision    . Colonoscopy      reports that he has never smoked. He has never used smokeless tobacco. He reports that he drinks alcohol. He reports that he does not use illicit drugs. family history includes COPD in his mother; Cancer in his father; Lung cancer in his father. There is no history of Colon cancer. Allergies  Allergen Reactions  . Penicillins   . Viagra [Sildenafil Citrate]    Current Outpatient Prescriptions on File Prior to Visit  Medication Sig Dispense Refill  . aspirin 81 MG tablet Take 1 tablet (81 mg total) by mouth daily. 30 tablet 11  . sildenafil (REVATIO) 20 MG tablet Take 2.5 tablets (50 mg total) by mouth 3 (three) times daily. 50 tablet 0  . triamcinolone cream (KENALOG) 0.1 % Apply 1 application topically 2 (two) times daily. (Patient not taking: Reported on 05/28/2015) 30 g 0   No current facility-administered medications on file prior to visit.   Review of Systems Constitutional: Negative for increased diaphoresis, other activity, appetite or siginficant weight change other than noted HENT: Negative for worsening hearing loss, ear pain, facial swelling, mouth sores and neck stiffness.   Eyes: Negative for other worsening pain, redness or visual disturbance.  Respiratory: Negative for shortness of breath and wheezing  Cardiovascular: Negative for chest pain and palpitations.  Gastrointestinal: Negative for diarrhea, blood in stool, abdominal distention or other pain Genitourinary: Negative for hematuria, flank pain or change in urine volume.  Musculoskeletal: Negative for myalgias or other joint complaints.  Skin: Negative for color change and wound or drainage.  Neurological: Negative for syncope and numbness. other than noted Hematological: Negative for adenopathy. or other swelling Psychiatric/Behavioral: Negative for hallucinations, SI, self-injury, decreased concentration or other worsening agitation.      Objective:   Physical Exam BP 122/76 mmHg   Pulse 63  Temp(Src) 98.3 F (36.8 C) (Oral)  Ht 5\' 11"  (1.803 m)  Wt 197 lb (89.359 kg)  BMI 27.49 kg/m2  SpO2 96% VS noted,  Constitutional: Pt is oriented to person, place, and time. Appears well-developed and well-nourished, in no significant distress Head: Normocephalic and atraumatic.  Right Ear: External ear normal.  Left Ear: External ear normal.  Nose: Nose normal.  Mouth/Throat: Oropharynx is clear and moist.  Eyes: Conjunctivae and EOM are normal. Pupils are equal, round, and reactive to light.  Neck: Normal range of motion. Neck supple. No JVD present. No tracheal deviation present or significant neck LA or mass Cardiovascular: Normal rate, regular rhythm, normal heart sounds and intact distal pulses.   Pulmonary/Chest: Effort normal and breath sounds without rales or wheezing  Abdominal: Soft. Bowel sounds are normal. NT. No HSM  Musculoskeletal: Normal range of motion. Exhibits no edema.  Lymphadenopathy:  Has no cervical adenopathy.  Neurological: Pt is alert and oriented to person, place, and time. Pt has normal reflexes. No cranial nerve deficit. Motor grossly intact Skin: Skin is warm and dry. No rash noted.  Psychiatric:  Has normal mood and affect. Behavior is normal.      Assessment & Plan:

## 2015-05-28 NOTE — Progress Notes (Signed)
Pre visit review using our clinic review tool, if applicable. No additional management support is needed unless otherwise documented below in the visit note. 

## 2015-05-28 NOTE — Assessment & Plan Note (Signed)
stable overall by history and exam, recent data reviewed with pt, and pt to continue medical treatment as before,  to f/u any worsening symptoms or concerns Lab Results  Component Value Date   WBC 7.4 05/25/2014   HGB 16.6 05/25/2014   HCT 48.8 05/25/2014   PLT 206.0 05/25/2014   GLUCOSE 97 05/25/2014   CHOL 169 05/25/2014   TRIG 161.0* 05/25/2014   HDL 40.30 05/25/2014   LDLDIRECT 138.7 02/26/2011   LDLCALC 97 05/25/2014   ALT 29 05/25/2014   AST 23 05/25/2014   NA 139 05/25/2014   K 4.2 05/25/2014   CL 104 05/25/2014   CREATININE 1.1 05/25/2014   BUN 12 05/25/2014   CO2 28 05/25/2014   TSH 0.43 05/25/2014   PSA 1.39 05/25/2014   HGBA1C 5.7 04/03/2013

## 2015-05-28 NOTE — Assessment & Plan Note (Addendum)
stable overall by history and exam, recent data reviewed with pt, and pt to continue medical treatment as before,  to f/u any worsening symptoms or concerns Lab Results  Component Value Date   LDLCALC 97 05/25/2014   ECG reviewed as per emr

## 2015-05-28 NOTE — Assessment & Plan Note (Signed)
Also for psa as he is due,  to f/u any worsening symptoms or concerns  

## 2015-05-28 NOTE — Assessment & Plan Note (Signed)
Stable,  to f/u any worsening symptoms or concerns, for med refill

## 2015-05-28 NOTE — Patient Instructions (Addendum)
You had the flu shot today  Your EKG was OK today  Please continue all other medications as before, and refills have been done if requested.  Please have the pharmacy call with any other refills you may need.  Please continue your efforts at being more active, low cholesterol diet, and weight control.  You are otherwise up to date with prevention measures today.  Please keep your appointments with your specialists as you may have planned  Please go to the LAB in the Basement (turn left off the elevator) for the tests to be done today  You will be contacted by phone if any changes need to be made immediately.  Otherwise, you will receive a letter about your results with an explanation, but please check with MyChart first.  Please remember to sign up for MyChart if you have not done so, as this will be important to you in the future with finding out test results, communicating by private email, and scheduling acute appointments online when needed.  Please return in 1 year for your yearly visit, or sooner if needed

## 2015-05-29 DIAGNOSIS — J3089 Other allergic rhinitis: Secondary | ICD-10-CM | POA: Diagnosis not present

## 2015-05-29 DIAGNOSIS — J3081 Allergic rhinitis due to animal (cat) (dog) hair and dander: Secondary | ICD-10-CM | POA: Diagnosis not present

## 2015-05-29 DIAGNOSIS — J301 Allergic rhinitis due to pollen: Secondary | ICD-10-CM | POA: Diagnosis not present

## 2015-06-03 DIAGNOSIS — J3081 Allergic rhinitis due to animal (cat) (dog) hair and dander: Secondary | ICD-10-CM | POA: Diagnosis not present

## 2015-06-03 DIAGNOSIS — J301 Allergic rhinitis due to pollen: Secondary | ICD-10-CM | POA: Diagnosis not present

## 2015-06-03 DIAGNOSIS — J3089 Other allergic rhinitis: Secondary | ICD-10-CM | POA: Diagnosis not present

## 2015-06-10 DIAGNOSIS — J3081 Allergic rhinitis due to animal (cat) (dog) hair and dander: Secondary | ICD-10-CM | POA: Diagnosis not present

## 2015-06-10 DIAGNOSIS — J3089 Other allergic rhinitis: Secondary | ICD-10-CM | POA: Diagnosis not present

## 2015-06-10 DIAGNOSIS — J301 Allergic rhinitis due to pollen: Secondary | ICD-10-CM | POA: Diagnosis not present

## 2015-06-17 DIAGNOSIS — J301 Allergic rhinitis due to pollen: Secondary | ICD-10-CM | POA: Diagnosis not present

## 2015-06-17 DIAGNOSIS — J3081 Allergic rhinitis due to animal (cat) (dog) hair and dander: Secondary | ICD-10-CM | POA: Diagnosis not present

## 2015-06-17 DIAGNOSIS — J3089 Other allergic rhinitis: Secondary | ICD-10-CM | POA: Diagnosis not present

## 2015-06-24 DIAGNOSIS — J3081 Allergic rhinitis due to animal (cat) (dog) hair and dander: Secondary | ICD-10-CM | POA: Diagnosis not present

## 2015-06-24 DIAGNOSIS — J3089 Other allergic rhinitis: Secondary | ICD-10-CM | POA: Diagnosis not present

## 2015-06-24 DIAGNOSIS — J301 Allergic rhinitis due to pollen: Secondary | ICD-10-CM | POA: Diagnosis not present

## 2015-07-02 DIAGNOSIS — J3081 Allergic rhinitis due to animal (cat) (dog) hair and dander: Secondary | ICD-10-CM | POA: Diagnosis not present

## 2015-07-02 DIAGNOSIS — J3089 Other allergic rhinitis: Secondary | ICD-10-CM | POA: Diagnosis not present

## 2015-07-02 DIAGNOSIS — J301 Allergic rhinitis due to pollen: Secondary | ICD-10-CM | POA: Diagnosis not present

## 2015-07-08 DIAGNOSIS — J301 Allergic rhinitis due to pollen: Secondary | ICD-10-CM | POA: Diagnosis not present

## 2015-07-08 DIAGNOSIS — J3089 Other allergic rhinitis: Secondary | ICD-10-CM | POA: Diagnosis not present

## 2015-07-08 DIAGNOSIS — J3081 Allergic rhinitis due to animal (cat) (dog) hair and dander: Secondary | ICD-10-CM | POA: Diagnosis not present

## 2015-07-15 DIAGNOSIS — J3081 Allergic rhinitis due to animal (cat) (dog) hair and dander: Secondary | ICD-10-CM | POA: Diagnosis not present

## 2015-07-15 DIAGNOSIS — J3089 Other allergic rhinitis: Secondary | ICD-10-CM | POA: Diagnosis not present

## 2015-07-15 DIAGNOSIS — J301 Allergic rhinitis due to pollen: Secondary | ICD-10-CM | POA: Diagnosis not present

## 2015-07-19 ENCOUNTER — Encounter: Payer: Self-pay | Admitting: *Deleted

## 2015-07-22 DIAGNOSIS — J3081 Allergic rhinitis due to animal (cat) (dog) hair and dander: Secondary | ICD-10-CM | POA: Diagnosis not present

## 2015-07-22 DIAGNOSIS — J301 Allergic rhinitis due to pollen: Secondary | ICD-10-CM | POA: Diagnosis not present

## 2015-07-22 DIAGNOSIS — J3089 Other allergic rhinitis: Secondary | ICD-10-CM | POA: Diagnosis not present

## 2015-07-29 DIAGNOSIS — J301 Allergic rhinitis due to pollen: Secondary | ICD-10-CM | POA: Diagnosis not present

## 2015-07-29 DIAGNOSIS — J3081 Allergic rhinitis due to animal (cat) (dog) hair and dander: Secondary | ICD-10-CM | POA: Diagnosis not present

## 2015-07-29 DIAGNOSIS — J3089 Other allergic rhinitis: Secondary | ICD-10-CM | POA: Diagnosis not present

## 2015-08-05 DIAGNOSIS — J3089 Other allergic rhinitis: Secondary | ICD-10-CM | POA: Diagnosis not present

## 2015-08-05 DIAGNOSIS — J3081 Allergic rhinitis due to animal (cat) (dog) hair and dander: Secondary | ICD-10-CM | POA: Diagnosis not present

## 2015-08-05 DIAGNOSIS — J301 Allergic rhinitis due to pollen: Secondary | ICD-10-CM | POA: Diagnosis not present

## 2015-08-13 DIAGNOSIS — J3081 Allergic rhinitis due to animal (cat) (dog) hair and dander: Secondary | ICD-10-CM | POA: Diagnosis not present

## 2015-08-13 DIAGNOSIS — J301 Allergic rhinitis due to pollen: Secondary | ICD-10-CM | POA: Diagnosis not present

## 2015-08-13 DIAGNOSIS — J3089 Other allergic rhinitis: Secondary | ICD-10-CM | POA: Diagnosis not present

## 2015-08-20 DIAGNOSIS — J301 Allergic rhinitis due to pollen: Secondary | ICD-10-CM | POA: Diagnosis not present

## 2015-08-20 DIAGNOSIS — J3089 Other allergic rhinitis: Secondary | ICD-10-CM | POA: Diagnosis not present

## 2015-08-20 DIAGNOSIS — J3081 Allergic rhinitis due to animal (cat) (dog) hair and dander: Secondary | ICD-10-CM | POA: Diagnosis not present

## 2015-08-22 DIAGNOSIS — J301 Allergic rhinitis due to pollen: Secondary | ICD-10-CM | POA: Diagnosis not present

## 2015-08-22 DIAGNOSIS — J3081 Allergic rhinitis due to animal (cat) (dog) hair and dander: Secondary | ICD-10-CM | POA: Diagnosis not present

## 2015-08-22 DIAGNOSIS — J3089 Other allergic rhinitis: Secondary | ICD-10-CM | POA: Diagnosis not present

## 2015-08-27 DIAGNOSIS — J3081 Allergic rhinitis due to animal (cat) (dog) hair and dander: Secondary | ICD-10-CM | POA: Diagnosis not present

## 2015-08-27 DIAGNOSIS — J301 Allergic rhinitis due to pollen: Secondary | ICD-10-CM | POA: Diagnosis not present

## 2015-08-27 DIAGNOSIS — J3089 Other allergic rhinitis: Secondary | ICD-10-CM | POA: Diagnosis not present

## 2015-09-02 DIAGNOSIS — J301 Allergic rhinitis due to pollen: Secondary | ICD-10-CM | POA: Diagnosis not present

## 2015-09-02 DIAGNOSIS — J3081 Allergic rhinitis due to animal (cat) (dog) hair and dander: Secondary | ICD-10-CM | POA: Diagnosis not present

## 2015-09-02 DIAGNOSIS — J3089 Other allergic rhinitis: Secondary | ICD-10-CM | POA: Diagnosis not present

## 2015-09-10 DIAGNOSIS — J3089 Other allergic rhinitis: Secondary | ICD-10-CM | POA: Diagnosis not present

## 2015-09-10 DIAGNOSIS — J3081 Allergic rhinitis due to animal (cat) (dog) hair and dander: Secondary | ICD-10-CM | POA: Diagnosis not present

## 2015-09-10 DIAGNOSIS — J301 Allergic rhinitis due to pollen: Secondary | ICD-10-CM | POA: Diagnosis not present

## 2015-09-17 DIAGNOSIS — J301 Allergic rhinitis due to pollen: Secondary | ICD-10-CM | POA: Diagnosis not present

## 2015-09-17 DIAGNOSIS — J3081 Allergic rhinitis due to animal (cat) (dog) hair and dander: Secondary | ICD-10-CM | POA: Diagnosis not present

## 2015-09-17 DIAGNOSIS — J3089 Other allergic rhinitis: Secondary | ICD-10-CM | POA: Diagnosis not present

## 2015-09-24 DIAGNOSIS — J3081 Allergic rhinitis due to animal (cat) (dog) hair and dander: Secondary | ICD-10-CM | POA: Diagnosis not present

## 2015-09-24 DIAGNOSIS — J3089 Other allergic rhinitis: Secondary | ICD-10-CM | POA: Diagnosis not present

## 2015-09-24 DIAGNOSIS — J301 Allergic rhinitis due to pollen: Secondary | ICD-10-CM | POA: Diagnosis not present

## 2015-10-01 DIAGNOSIS — J301 Allergic rhinitis due to pollen: Secondary | ICD-10-CM | POA: Diagnosis not present

## 2015-10-01 DIAGNOSIS — J3081 Allergic rhinitis due to animal (cat) (dog) hair and dander: Secondary | ICD-10-CM | POA: Diagnosis not present

## 2015-10-01 DIAGNOSIS — J3089 Other allergic rhinitis: Secondary | ICD-10-CM | POA: Diagnosis not present

## 2015-10-08 DIAGNOSIS — J301 Allergic rhinitis due to pollen: Secondary | ICD-10-CM | POA: Diagnosis not present

## 2015-10-08 DIAGNOSIS — J3081 Allergic rhinitis due to animal (cat) (dog) hair and dander: Secondary | ICD-10-CM | POA: Diagnosis not present

## 2015-10-08 DIAGNOSIS — J3089 Other allergic rhinitis: Secondary | ICD-10-CM | POA: Diagnosis not present

## 2015-10-15 DIAGNOSIS — J3081 Allergic rhinitis due to animal (cat) (dog) hair and dander: Secondary | ICD-10-CM | POA: Diagnosis not present

## 2015-10-15 DIAGNOSIS — J301 Allergic rhinitis due to pollen: Secondary | ICD-10-CM | POA: Diagnosis not present

## 2015-10-15 DIAGNOSIS — J3089 Other allergic rhinitis: Secondary | ICD-10-CM | POA: Diagnosis not present

## 2015-10-21 DIAGNOSIS — J3089 Other allergic rhinitis: Secondary | ICD-10-CM | POA: Diagnosis not present

## 2015-10-21 DIAGNOSIS — J301 Allergic rhinitis due to pollen: Secondary | ICD-10-CM | POA: Diagnosis not present

## 2015-10-21 DIAGNOSIS — J3081 Allergic rhinitis due to animal (cat) (dog) hair and dander: Secondary | ICD-10-CM | POA: Diagnosis not present

## 2015-10-28 DIAGNOSIS — J301 Allergic rhinitis due to pollen: Secondary | ICD-10-CM | POA: Diagnosis not present

## 2015-10-28 DIAGNOSIS — J3081 Allergic rhinitis due to animal (cat) (dog) hair and dander: Secondary | ICD-10-CM | POA: Diagnosis not present

## 2015-10-28 DIAGNOSIS — J3089 Other allergic rhinitis: Secondary | ICD-10-CM | POA: Diagnosis not present

## 2015-11-05 DIAGNOSIS — J3081 Allergic rhinitis due to animal (cat) (dog) hair and dander: Secondary | ICD-10-CM | POA: Diagnosis not present

## 2015-11-05 DIAGNOSIS — J301 Allergic rhinitis due to pollen: Secondary | ICD-10-CM | POA: Diagnosis not present

## 2015-11-05 DIAGNOSIS — J3089 Other allergic rhinitis: Secondary | ICD-10-CM | POA: Diagnosis not present

## 2015-11-11 DIAGNOSIS — J3081 Allergic rhinitis due to animal (cat) (dog) hair and dander: Secondary | ICD-10-CM | POA: Diagnosis not present

## 2015-11-11 DIAGNOSIS — J3089 Other allergic rhinitis: Secondary | ICD-10-CM | POA: Diagnosis not present

## 2015-11-11 DIAGNOSIS — J301 Allergic rhinitis due to pollen: Secondary | ICD-10-CM | POA: Diagnosis not present

## 2015-11-19 DIAGNOSIS — J301 Allergic rhinitis due to pollen: Secondary | ICD-10-CM | POA: Diagnosis not present

## 2015-11-19 DIAGNOSIS — J3081 Allergic rhinitis due to animal (cat) (dog) hair and dander: Secondary | ICD-10-CM | POA: Diagnosis not present

## 2015-11-19 DIAGNOSIS — J3089 Other allergic rhinitis: Secondary | ICD-10-CM | POA: Diagnosis not present

## 2015-11-25 DIAGNOSIS — J301 Allergic rhinitis due to pollen: Secondary | ICD-10-CM | POA: Diagnosis not present

## 2015-11-25 DIAGNOSIS — J3089 Other allergic rhinitis: Secondary | ICD-10-CM | POA: Diagnosis not present

## 2015-11-25 DIAGNOSIS — J3081 Allergic rhinitis due to animal (cat) (dog) hair and dander: Secondary | ICD-10-CM | POA: Diagnosis not present

## 2015-12-02 DIAGNOSIS — J301 Allergic rhinitis due to pollen: Secondary | ICD-10-CM | POA: Diagnosis not present

## 2015-12-02 DIAGNOSIS — J3089 Other allergic rhinitis: Secondary | ICD-10-CM | POA: Diagnosis not present

## 2015-12-02 DIAGNOSIS — J3081 Allergic rhinitis due to animal (cat) (dog) hair and dander: Secondary | ICD-10-CM | POA: Diagnosis not present

## 2015-12-05 DIAGNOSIS — L820 Inflamed seborrheic keratosis: Secondary | ICD-10-CM | POA: Diagnosis not present

## 2015-12-05 DIAGNOSIS — L821 Other seborrheic keratosis: Secondary | ICD-10-CM | POA: Diagnosis not present

## 2015-12-05 DIAGNOSIS — B360 Pityriasis versicolor: Secondary | ICD-10-CM | POA: Diagnosis not present

## 2015-12-05 DIAGNOSIS — L812 Freckles: Secondary | ICD-10-CM | POA: Diagnosis not present

## 2015-12-05 DIAGNOSIS — D1801 Hemangioma of skin and subcutaneous tissue: Secondary | ICD-10-CM | POA: Diagnosis not present

## 2015-12-10 DIAGNOSIS — J3089 Other allergic rhinitis: Secondary | ICD-10-CM | POA: Diagnosis not present

## 2015-12-10 DIAGNOSIS — J3081 Allergic rhinitis due to animal (cat) (dog) hair and dander: Secondary | ICD-10-CM | POA: Diagnosis not present

## 2015-12-10 DIAGNOSIS — J301 Allergic rhinitis due to pollen: Secondary | ICD-10-CM | POA: Diagnosis not present

## 2015-12-17 DIAGNOSIS — J301 Allergic rhinitis due to pollen: Secondary | ICD-10-CM | POA: Diagnosis not present

## 2015-12-17 DIAGNOSIS — J3089 Other allergic rhinitis: Secondary | ICD-10-CM | POA: Diagnosis not present

## 2015-12-17 DIAGNOSIS — J3081 Allergic rhinitis due to animal (cat) (dog) hair and dander: Secondary | ICD-10-CM | POA: Diagnosis not present

## 2015-12-26 DIAGNOSIS — J3081 Allergic rhinitis due to animal (cat) (dog) hair and dander: Secondary | ICD-10-CM | POA: Diagnosis not present

## 2015-12-26 DIAGNOSIS — J301 Allergic rhinitis due to pollen: Secondary | ICD-10-CM | POA: Diagnosis not present

## 2015-12-26 DIAGNOSIS — J3089 Other allergic rhinitis: Secondary | ICD-10-CM | POA: Diagnosis not present

## 2015-12-31 DIAGNOSIS — J301 Allergic rhinitis due to pollen: Secondary | ICD-10-CM | POA: Diagnosis not present

## 2015-12-31 DIAGNOSIS — J3089 Other allergic rhinitis: Secondary | ICD-10-CM | POA: Diagnosis not present

## 2015-12-31 DIAGNOSIS — J3081 Allergic rhinitis due to animal (cat) (dog) hair and dander: Secondary | ICD-10-CM | POA: Diagnosis not present

## 2016-01-07 DIAGNOSIS — J3089 Other allergic rhinitis: Secondary | ICD-10-CM | POA: Diagnosis not present

## 2016-01-07 DIAGNOSIS — J3081 Allergic rhinitis due to animal (cat) (dog) hair and dander: Secondary | ICD-10-CM | POA: Diagnosis not present

## 2016-01-07 DIAGNOSIS — J301 Allergic rhinitis due to pollen: Secondary | ICD-10-CM | POA: Diagnosis not present

## 2016-01-14 DIAGNOSIS — J301 Allergic rhinitis due to pollen: Secondary | ICD-10-CM | POA: Diagnosis not present

## 2016-01-14 DIAGNOSIS — J3081 Allergic rhinitis due to animal (cat) (dog) hair and dander: Secondary | ICD-10-CM | POA: Diagnosis not present

## 2016-01-14 DIAGNOSIS — J3089 Other allergic rhinitis: Secondary | ICD-10-CM | POA: Diagnosis not present

## 2016-01-23 DIAGNOSIS — J301 Allergic rhinitis due to pollen: Secondary | ICD-10-CM | POA: Diagnosis not present

## 2016-01-23 DIAGNOSIS — J3081 Allergic rhinitis due to animal (cat) (dog) hair and dander: Secondary | ICD-10-CM | POA: Diagnosis not present

## 2016-01-23 DIAGNOSIS — J3089 Other allergic rhinitis: Secondary | ICD-10-CM | POA: Diagnosis not present

## 2016-01-27 DIAGNOSIS — J3089 Other allergic rhinitis: Secondary | ICD-10-CM | POA: Diagnosis not present

## 2016-01-27 DIAGNOSIS — J301 Allergic rhinitis due to pollen: Secondary | ICD-10-CM | POA: Diagnosis not present

## 2016-01-27 DIAGNOSIS — J3081 Allergic rhinitis due to animal (cat) (dog) hair and dander: Secondary | ICD-10-CM | POA: Diagnosis not present

## 2016-01-30 DIAGNOSIS — J301 Allergic rhinitis due to pollen: Secondary | ICD-10-CM | POA: Diagnosis not present

## 2016-01-30 DIAGNOSIS — J3089 Other allergic rhinitis: Secondary | ICD-10-CM | POA: Diagnosis not present

## 2016-01-31 DIAGNOSIS — J3089 Other allergic rhinitis: Secondary | ICD-10-CM | POA: Diagnosis not present

## 2016-01-31 DIAGNOSIS — J301 Allergic rhinitis due to pollen: Secondary | ICD-10-CM | POA: Diagnosis not present

## 2016-02-06 DIAGNOSIS — J301 Allergic rhinitis due to pollen: Secondary | ICD-10-CM | POA: Diagnosis not present

## 2016-02-06 DIAGNOSIS — J3081 Allergic rhinitis due to animal (cat) (dog) hair and dander: Secondary | ICD-10-CM | POA: Diagnosis not present

## 2016-02-06 DIAGNOSIS — J3089 Other allergic rhinitis: Secondary | ICD-10-CM | POA: Diagnosis not present

## 2016-02-11 DIAGNOSIS — J3081 Allergic rhinitis due to animal (cat) (dog) hair and dander: Secondary | ICD-10-CM | POA: Diagnosis not present

## 2016-02-11 DIAGNOSIS — J301 Allergic rhinitis due to pollen: Secondary | ICD-10-CM | POA: Diagnosis not present

## 2016-02-11 DIAGNOSIS — J3089 Other allergic rhinitis: Secondary | ICD-10-CM | POA: Diagnosis not present

## 2016-02-18 DIAGNOSIS — J3089 Other allergic rhinitis: Secondary | ICD-10-CM | POA: Diagnosis not present

## 2016-02-18 DIAGNOSIS — J3081 Allergic rhinitis due to animal (cat) (dog) hair and dander: Secondary | ICD-10-CM | POA: Diagnosis not present

## 2016-02-18 DIAGNOSIS — J301 Allergic rhinitis due to pollen: Secondary | ICD-10-CM | POA: Diagnosis not present

## 2016-02-24 DIAGNOSIS — J3089 Other allergic rhinitis: Secondary | ICD-10-CM | POA: Diagnosis not present

## 2016-02-24 DIAGNOSIS — J301 Allergic rhinitis due to pollen: Secondary | ICD-10-CM | POA: Diagnosis not present

## 2016-02-24 DIAGNOSIS — J3081 Allergic rhinitis due to animal (cat) (dog) hair and dander: Secondary | ICD-10-CM | POA: Diagnosis not present

## 2016-03-02 DIAGNOSIS — J3081 Allergic rhinitis due to animal (cat) (dog) hair and dander: Secondary | ICD-10-CM | POA: Diagnosis not present

## 2016-03-02 DIAGNOSIS — J301 Allergic rhinitis due to pollen: Secondary | ICD-10-CM | POA: Diagnosis not present

## 2016-03-02 DIAGNOSIS — J3089 Other allergic rhinitis: Secondary | ICD-10-CM | POA: Diagnosis not present

## 2016-03-10 DIAGNOSIS — J3081 Allergic rhinitis due to animal (cat) (dog) hair and dander: Secondary | ICD-10-CM | POA: Diagnosis not present

## 2016-03-10 DIAGNOSIS — J301 Allergic rhinitis due to pollen: Secondary | ICD-10-CM | POA: Diagnosis not present

## 2016-03-10 DIAGNOSIS — J3089 Other allergic rhinitis: Secondary | ICD-10-CM | POA: Diagnosis not present

## 2016-03-16 DIAGNOSIS — J3081 Allergic rhinitis due to animal (cat) (dog) hair and dander: Secondary | ICD-10-CM | POA: Diagnosis not present

## 2016-03-16 DIAGNOSIS — J301 Allergic rhinitis due to pollen: Secondary | ICD-10-CM | POA: Diagnosis not present

## 2016-03-16 DIAGNOSIS — J3089 Other allergic rhinitis: Secondary | ICD-10-CM | POA: Diagnosis not present

## 2016-03-18 DIAGNOSIS — J301 Allergic rhinitis due to pollen: Secondary | ICD-10-CM | POA: Diagnosis not present

## 2016-03-18 DIAGNOSIS — J3081 Allergic rhinitis due to animal (cat) (dog) hair and dander: Secondary | ICD-10-CM | POA: Diagnosis not present

## 2016-03-18 DIAGNOSIS — J3089 Other allergic rhinitis: Secondary | ICD-10-CM | POA: Diagnosis not present

## 2016-03-25 DIAGNOSIS — J301 Allergic rhinitis due to pollen: Secondary | ICD-10-CM | POA: Diagnosis not present

## 2016-03-25 DIAGNOSIS — J3089 Other allergic rhinitis: Secondary | ICD-10-CM | POA: Diagnosis not present

## 2016-03-25 DIAGNOSIS — J3081 Allergic rhinitis due to animal (cat) (dog) hair and dander: Secondary | ICD-10-CM | POA: Diagnosis not present

## 2016-03-31 DIAGNOSIS — J3081 Allergic rhinitis due to animal (cat) (dog) hair and dander: Secondary | ICD-10-CM | POA: Diagnosis not present

## 2016-03-31 DIAGNOSIS — J3089 Other allergic rhinitis: Secondary | ICD-10-CM | POA: Diagnosis not present

## 2016-03-31 DIAGNOSIS — J301 Allergic rhinitis due to pollen: Secondary | ICD-10-CM | POA: Diagnosis not present

## 2016-04-02 ENCOUNTER — Other Ambulatory Visit: Payer: Self-pay | Admitting: Internal Medicine

## 2016-04-06 DIAGNOSIS — J3089 Other allergic rhinitis: Secondary | ICD-10-CM | POA: Diagnosis not present

## 2016-04-06 DIAGNOSIS — J3081 Allergic rhinitis due to animal (cat) (dog) hair and dander: Secondary | ICD-10-CM | POA: Diagnosis not present

## 2016-04-06 DIAGNOSIS — J301 Allergic rhinitis due to pollen: Secondary | ICD-10-CM | POA: Diagnosis not present

## 2016-04-13 DIAGNOSIS — J301 Allergic rhinitis due to pollen: Secondary | ICD-10-CM | POA: Diagnosis not present

## 2016-04-13 DIAGNOSIS — J3089 Other allergic rhinitis: Secondary | ICD-10-CM | POA: Diagnosis not present

## 2016-04-13 DIAGNOSIS — J3081 Allergic rhinitis due to animal (cat) (dog) hair and dander: Secondary | ICD-10-CM | POA: Diagnosis not present

## 2016-04-21 DIAGNOSIS — J3081 Allergic rhinitis due to animal (cat) (dog) hair and dander: Secondary | ICD-10-CM | POA: Diagnosis not present

## 2016-04-21 DIAGNOSIS — J3089 Other allergic rhinitis: Secondary | ICD-10-CM | POA: Diagnosis not present

## 2016-04-21 DIAGNOSIS — J301 Allergic rhinitis due to pollen: Secondary | ICD-10-CM | POA: Diagnosis not present

## 2016-05-04 DIAGNOSIS — J3089 Other allergic rhinitis: Secondary | ICD-10-CM | POA: Diagnosis not present

## 2016-05-04 DIAGNOSIS — J301 Allergic rhinitis due to pollen: Secondary | ICD-10-CM | POA: Diagnosis not present

## 2016-05-04 DIAGNOSIS — J3081 Allergic rhinitis due to animal (cat) (dog) hair and dander: Secondary | ICD-10-CM | POA: Diagnosis not present

## 2016-05-12 DIAGNOSIS — J3089 Other allergic rhinitis: Secondary | ICD-10-CM | POA: Diagnosis not present

## 2016-05-12 DIAGNOSIS — J301 Allergic rhinitis due to pollen: Secondary | ICD-10-CM | POA: Diagnosis not present

## 2016-05-12 DIAGNOSIS — J3081 Allergic rhinitis due to animal (cat) (dog) hair and dander: Secondary | ICD-10-CM | POA: Diagnosis not present

## 2016-05-18 DIAGNOSIS — J301 Allergic rhinitis due to pollen: Secondary | ICD-10-CM | POA: Diagnosis not present

## 2016-05-18 DIAGNOSIS — J3081 Allergic rhinitis due to animal (cat) (dog) hair and dander: Secondary | ICD-10-CM | POA: Diagnosis not present

## 2016-05-18 DIAGNOSIS — J3089 Other allergic rhinitis: Secondary | ICD-10-CM | POA: Diagnosis not present

## 2016-05-26 DIAGNOSIS — J3081 Allergic rhinitis due to animal (cat) (dog) hair and dander: Secondary | ICD-10-CM | POA: Diagnosis not present

## 2016-05-26 DIAGNOSIS — J3089 Other allergic rhinitis: Secondary | ICD-10-CM | POA: Diagnosis not present

## 2016-05-26 DIAGNOSIS — J301 Allergic rhinitis due to pollen: Secondary | ICD-10-CM | POA: Diagnosis not present

## 2016-06-02 DIAGNOSIS — J301 Allergic rhinitis due to pollen: Secondary | ICD-10-CM | POA: Diagnosis not present

## 2016-06-02 DIAGNOSIS — J3081 Allergic rhinitis due to animal (cat) (dog) hair and dander: Secondary | ICD-10-CM | POA: Diagnosis not present

## 2016-06-02 DIAGNOSIS — J3089 Other allergic rhinitis: Secondary | ICD-10-CM | POA: Diagnosis not present

## 2016-06-10 ENCOUNTER — Encounter: Payer: Self-pay | Admitting: Internal Medicine

## 2016-06-10 ENCOUNTER — Other Ambulatory Visit (INDEPENDENT_AMBULATORY_CARE_PROVIDER_SITE_OTHER): Payer: Medicare Other

## 2016-06-10 ENCOUNTER — Ambulatory Visit (INDEPENDENT_AMBULATORY_CARE_PROVIDER_SITE_OTHER): Payer: Medicare Other | Admitting: Internal Medicine

## 2016-06-10 ENCOUNTER — Other Ambulatory Visit: Payer: Self-pay | Admitting: Internal Medicine

## 2016-06-10 VITALS — BP 130/70 | HR 27 | Temp 98.0°F | Resp 20 | Wt 199.0 lb

## 2016-06-10 DIAGNOSIS — E785 Hyperlipidemia, unspecified: Secondary | ICD-10-CM | POA: Diagnosis not present

## 2016-06-10 DIAGNOSIS — J309 Allergic rhinitis, unspecified: Secondary | ICD-10-CM

## 2016-06-10 DIAGNOSIS — N320 Bladder-neck obstruction: Secondary | ICD-10-CM

## 2016-06-10 DIAGNOSIS — R7302 Impaired glucose tolerance (oral): Secondary | ICD-10-CM | POA: Diagnosis not present

## 2016-06-10 DIAGNOSIS — R3129 Other microscopic hematuria: Secondary | ICD-10-CM

## 2016-06-10 LAB — BASIC METABOLIC PANEL
BUN: 14 mg/dL (ref 6–23)
CO2: 30 mEq/L (ref 19–32)
Calcium: 9.4 mg/dL (ref 8.4–10.5)
Chloride: 105 mEq/L (ref 96–112)
Creatinine, Ser: 1.04 mg/dL (ref 0.40–1.50)
GFR: 74.43 mL/min (ref >60.00)
Glucose, Bld: 95 mg/dL (ref 70–99)
Potassium: 4.8 mEq/L (ref 3.5–5.1)
Sodium: 141 mEq/L (ref 135–145)

## 2016-06-10 LAB — CBC WITH DIFFERENTIAL/PLATELET
Basophils Absolute: 0 10*3/uL (ref 0.0–0.1)
Basophils Relative: 0.4 % (ref 0.0–3.0)
Eosinophils Absolute: 0.1 10*3/uL (ref 0.0–0.7)
Eosinophils Relative: 1.6 % (ref 0.0–5.0)
HCT: 48.5 % (ref 39.0–52.0)
Hemoglobin: 16.7 g/dL (ref 13.0–17.0)
Lymphocytes Relative: 33.5 % (ref 12.0–46.0)
Lymphs Abs: 2.4 10*3/uL (ref 0.7–4.0)
MCHC: 34.4 g/dL (ref 30.0–36.0)
MCV: 87.2 fl (ref 78.0–100.0)
Monocytes Absolute: 0.7 10*3/uL (ref 0.1–1.0)
Monocytes Relative: 9.8 % (ref 3.0–12.0)
Neutro Abs: 4 10*3/uL (ref 1.4–7.7)
Neutrophils Relative %: 54.7 % (ref 43.0–77.0)
Platelets: 187 10*3/uL (ref 150.0–400.0)
RBC: 5.56 Mil/uL (ref 4.22–5.81)
RDW: 12.4 % (ref 11.5–15.5)
WBC: 7.3 10*3/uL (ref 4.0–10.5)

## 2016-06-10 LAB — URINALYSIS, ROUTINE W REFLEX MICROSCOPIC
Bilirubin Urine: NEGATIVE
Ketones, ur: NEGATIVE
Leukocytes, UA: NEGATIVE
Nitrite: NEGATIVE
Specific Gravity, Urine: >=1.03 — AB (ref 1.000–1.030)
Total Protein, Urine: NEGATIVE
Urine Glucose: NEGATIVE
Urobilinogen, UA: 0.2 (ref 0.0–1.0)
pH: 5.5 (ref 5.0–8.0)

## 2016-06-10 LAB — LIPID PANEL
Cholesterol: 170 mg/dL (ref 0–200)
HDL: 46.9 mg/dL (ref >39.00)
LDL Cholesterol: 102 mg/dL — ABNORMAL HIGH (ref 0–99)
NonHDL: 123.3
Total CHOL/HDL Ratio: 4
Triglycerides: 105 mg/dL (ref 0.0–149.0)
VLDL: 21 mg/dL (ref 0.0–40.0)

## 2016-06-10 LAB — TSH: TSH: 0.97 u[IU]/mL (ref 0.35–4.50)

## 2016-06-10 LAB — HEPATIC FUNCTION PANEL
ALT: 37 U/L (ref 0–53)
AST: 28 U/L (ref 0–37)
Albumin: 4.3 g/dL (ref 3.5–5.2)
Alkaline Phosphatase: 53 U/L (ref 39–117)
Bilirubin, Direct: 0.2 mg/dL (ref 0.0–0.3)
Total Bilirubin: 1.1 mg/dL (ref 0.2–1.2)
Total Protein: 7 g/dL (ref 6.0–8.3)

## 2016-06-10 LAB — HEMOGLOBIN A1C: Hgb A1c MFr Bld: 5.3 % (ref 4.6–6.5)

## 2016-06-10 LAB — PSA: PSA: 1.68 ng/mL (ref 0.10–4.00)

## 2016-06-10 NOTE — Progress Notes (Signed)
Pre visit review using our clinic review tool, if applicable. No additional management support is needed unless otherwise documented below in the visit note. 

## 2016-06-10 NOTE — Progress Notes (Signed)
Subjective:    Patient ID: Charles Lawson, male    DOB: Apr 20, 1941, 75 y.o.   MRN: [id number]  HPI  Here to f/u; overall doing ok,  Pt denies chest pain, increasing sob or doe, wheezing, orthopnea, PND, increased LE swelling, palpitations, dizziness or syncope.  Pt denies new neurological symptoms such as new headache, or facial or extremity weakness or numbness.  Pt denies polydipsia, polyuria, or low sugar episode.   Pt denies new neurological symptoms such as new headache, or facial or extremity weakness or numbness.   Pt states overall good compliance with meds, mostly trying to follow appropriate diet, with wt overall stable,  but little exercise however. Does have several wks ongoing nasal allergy symptoms with clearish congestion, itch and sneezing, without fever, pain, ST, cough, swelling or wheezing.  Denies urinary symptoms such as dysuria, frequency, urgency, flank pain, hematuria or n/v, fever, chills. Past Medical History:  Diagnosis Date  . Allergic rhinitis, cause unspecified   . Arthritis   . 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 Hyperlipidemia   . HYPERLIPIDEMIA 01/30/2010   Qualifier: Diagnosis of  By: Joyce Gross    . Impaired glucose tolerance 04/03/2013  . Personal history of colonic polyps 06/07/2012   Past Surgical History:  Procedure Laterality Date  . BACK SURGERY    . BASAL CELL CARCINOMA EXCISION    . COLONOSCOPY    . HERNIA REPAIR    . herniorrhaphy right    . LUMBAR LAMINECTOMY    . SPINE SURGERY     lumbar laminectomy    reports that he has never smoked. He has never used smokeless tobacco. He reports that he drinks alcohol. He reports that he does not use drugs. family history includes COPD in his mother; Cancer in his father; Lung cancer in his father. Allergies  Allergen Reactions  .  Penicillins    Current Outpatient Prescriptions on File Prior to Visit  Medication Sig Dispense Refill  . aspirin 81 MG tablet Take 1 tablet (81 mg total) by mouth daily. 30 tablet 11  . ENSURE PLUS (ENSURE PLUS) LIQD Take 1 Can by mouth 2 (two) times daily between meals. 60 Can 11  . sildenafil (REVATIO) 20 MG tablet TAKE 2 AND ONE-HALF TABLETS (50 MG TOTAL) BY MOUTH AS NEEDED FOR SEXUAL ACTIVITY 50 tablet 0  . triamcinolone cream (KENALOG) 0.1 % Apply 1 application topically 2 (two) times daily. 30 g 0   No current facility-administered medications on file prior to visit.    Review of Systems  Constitutional: Negative for unusual diaphoresis or night sweats HENT: Negative for ear swelling or discharge Eyes: Negative for worsening visual haziness  Respiratory: Negative for choking and stridor.   Gastrointestinal: Negative for distension or worsening eructation Genitourinary: Negative for retention or change in urine volume.  Musculoskeletal: Negative for other MSK pain or swelling Skin: Negative for color change and worsening wound Neurological: Negative for tremors and numbness other than noted  Psychiatric/Behavioral: Negative for decreased concentration or agitation other than above       Objective:   Physical Exam BP 130/70   Pulse (!) 27   Temp 98 F (36.7 C) (Oral)   Resp 20   Wt 199 lb (90.3 kg)   SpO2 98%   BMI 27.75 kg/m  VS noted,  Constitutional: Pt appears in no apparent distress HENT: Head: NCAT.  Right Ear: External ear normal.  Left Ear: External ear normal.  Eyes: . Pupils are equal, round, and reactive to light. Conjunctivae and EOM are normal Bilat tm's with mild erythema.  Max sinus areas non tender.  Pharynx with mild erythema, no exudate Neck: Normal range of motion. Neck supple.  Cardiovascular: Normal rate and regular rhythm.   Pulmonary/Chest: Effort normal and breath sounds without rales or wheezing.  Neurological: Pt is alert. Not confused ,  motor grossly intact Skin: Skin is warm. No rash, no LE edema Psychiatric: Pt behavior is normal. No agitation.     Assessment & Plan:

## 2016-06-10 NOTE — Patient Instructions (Addendum)
You had the flu shot today  Please continue all other medications as before, and refills have been done if requested.  Please have the pharmacy call with any other refills you may need.  Please continue your efforts at being more active, low cholesterol diet, and weight control.  You are otherwise up to date with prevention measures today.  Please keep your appointments with your specialists as you may have planned  .Please go to the LAB in the Basement (turn left off the elevator) for the tests to be done today  You will be contacted by phone if any changes need to be made immediately.  Otherwise, you will receive a letter about your results with an explanation, but please check with MyChart first.  Please remember to sign up for MyChart if you have not done so, as this will be important to you in the future with finding out test results, communicating by private email, and scheduling acute appointments online when needed.  Please return in 1 year for your yearly visit

## 2016-06-11 DIAGNOSIS — J3081 Allergic rhinitis due to animal (cat) (dog) hair and dander: Secondary | ICD-10-CM | POA: Diagnosis not present

## 2016-06-11 DIAGNOSIS — J3089 Other allergic rhinitis: Secondary | ICD-10-CM | POA: Diagnosis not present

## 2016-06-11 DIAGNOSIS — J301 Allergic rhinitis due to pollen: Secondary | ICD-10-CM | POA: Diagnosis not present

## 2016-06-13 NOTE — Assessment & Plan Note (Signed)
stable overall by history and exam, recent data reviewed with pt, and pt to continue medical treatment as before,  to f/u any worsening symptoms or concerns Lab Results  Component Value Date   LDLCALC 102 (H) 06/10/2016

## 2016-06-13 NOTE — Assessment & Plan Note (Signed)
Also for psa as he is due 

## 2016-06-13 NOTE — Assessment & Plan Note (Signed)
stable overall by history and exam, recent data reviewed with pt, and pt to continue medical treatment as before,  to f/u any worsening symptoms or concerns Lab Results  Component Value Date   HGBA1C 5.3 06/10/2016

## 2016-06-13 NOTE — Assessment & Plan Note (Signed)
Mild to mod, for otc zyrtec/nasacort,   to f/u any worsening symptoms or concerns

## 2016-06-16 DIAGNOSIS — J3089 Other allergic rhinitis: Secondary | ICD-10-CM | POA: Diagnosis not present

## 2016-06-16 DIAGNOSIS — J301 Allergic rhinitis due to pollen: Secondary | ICD-10-CM | POA: Diagnosis not present

## 2016-06-16 DIAGNOSIS — J3081 Allergic rhinitis due to animal (cat) (dog) hair and dander: Secondary | ICD-10-CM | POA: Diagnosis not present

## 2016-06-26 DIAGNOSIS — J301 Allergic rhinitis due to pollen: Secondary | ICD-10-CM | POA: Diagnosis not present

## 2016-06-26 DIAGNOSIS — J3081 Allergic rhinitis due to animal (cat) (dog) hair and dander: Secondary | ICD-10-CM | POA: Diagnosis not present

## 2016-06-26 DIAGNOSIS — J3089 Other allergic rhinitis: Secondary | ICD-10-CM | POA: Diagnosis not present

## 2016-06-30 DIAGNOSIS — J301 Allergic rhinitis due to pollen: Secondary | ICD-10-CM | POA: Diagnosis not present

## 2016-06-30 DIAGNOSIS — J3089 Other allergic rhinitis: Secondary | ICD-10-CM | POA: Diagnosis not present

## 2016-06-30 DIAGNOSIS — J3081 Allergic rhinitis due to animal (cat) (dog) hair and dander: Secondary | ICD-10-CM | POA: Diagnosis not present

## 2016-07-08 DIAGNOSIS — J3081 Allergic rhinitis due to animal (cat) (dog) hair and dander: Secondary | ICD-10-CM | POA: Diagnosis not present

## 2016-07-08 DIAGNOSIS — J301 Allergic rhinitis due to pollen: Secondary | ICD-10-CM | POA: Diagnosis not present

## 2016-07-08 DIAGNOSIS — J3089 Other allergic rhinitis: Secondary | ICD-10-CM | POA: Diagnosis not present

## 2016-07-13 DIAGNOSIS — J301 Allergic rhinitis due to pollen: Secondary | ICD-10-CM | POA: Diagnosis not present

## 2016-07-13 DIAGNOSIS — J3081 Allergic rhinitis due to animal (cat) (dog) hair and dander: Secondary | ICD-10-CM | POA: Diagnosis not present

## 2016-07-13 DIAGNOSIS — J3089 Other allergic rhinitis: Secondary | ICD-10-CM | POA: Diagnosis not present

## 2016-07-24 DIAGNOSIS — J3081 Allergic rhinitis due to animal (cat) (dog) hair and dander: Secondary | ICD-10-CM | POA: Diagnosis not present

## 2016-07-24 DIAGNOSIS — J3089 Other allergic rhinitis: Secondary | ICD-10-CM | POA: Diagnosis not present

## 2016-07-24 DIAGNOSIS — J301 Allergic rhinitis due to pollen: Secondary | ICD-10-CM | POA: Diagnosis not present

## 2016-07-28 DIAGNOSIS — J3081 Allergic rhinitis due to animal (cat) (dog) hair and dander: Secondary | ICD-10-CM | POA: Diagnosis not present

## 2016-07-28 DIAGNOSIS — J301 Allergic rhinitis due to pollen: Secondary | ICD-10-CM | POA: Diagnosis not present

## 2016-07-28 DIAGNOSIS — J3089 Other allergic rhinitis: Secondary | ICD-10-CM | POA: Diagnosis not present

## 2016-07-31 DIAGNOSIS — R311 Benign essential microscopic hematuria: Secondary | ICD-10-CM | POA: Diagnosis not present

## 2016-11-27 ENCOUNTER — Telehealth: Payer: Self-pay | Admitting: Internal Medicine

## 2016-11-27 NOTE — Telephone Encounter (Signed)
Attempted to call patient regarding awv. Patient's phone was busy. Will attempt to call patient at a later time.

## 2016-12-03 DIAGNOSIS — D1801 Hemangioma of skin and subcutaneous tissue: Secondary | ICD-10-CM | POA: Diagnosis not present

## 2016-12-03 DIAGNOSIS — L570 Actinic keratosis: Secondary | ICD-10-CM | POA: Diagnosis not present

## 2016-12-03 DIAGNOSIS — D225 Melanocytic nevi of trunk: Secondary | ICD-10-CM | POA: Diagnosis not present

## 2016-12-03 DIAGNOSIS — L821 Other seborrheic keratosis: Secondary | ICD-10-CM | POA: Diagnosis not present

## 2016-12-03 DIAGNOSIS — L918 Other hypertrophic disorders of the skin: Secondary | ICD-10-CM | POA: Diagnosis not present

## 2016-12-03 DIAGNOSIS — L814 Other melanin hyperpigmentation: Secondary | ICD-10-CM | POA: Diagnosis not present

## 2017-05-19 ENCOUNTER — Telehealth: Payer: Self-pay | Admitting: Internal Medicine

## 2017-05-19 DIAGNOSIS — Z0000 Encounter for general adult medical examination without abnormal findings: Secondary | ICD-10-CM

## 2017-05-19 NOTE — Telephone Encounter (Signed)
LM informing pt's wife.  °

## 2017-05-19 NOTE — Telephone Encounter (Signed)
Pts wife called to schedule a physical with Dr Jenny Reichmann for the pt. She wanted to know if he needed to have labs done prior to his visit or if they would be ordered while he is here. If Dr Jenny Reichmann would like for them to be done before, can an order be entered for him? Please advise.

## 2017-05-19 NOTE — Telephone Encounter (Signed)
Done

## 2017-06-11 ENCOUNTER — Ambulatory Visit (INDEPENDENT_AMBULATORY_CARE_PROVIDER_SITE_OTHER): Payer: Medicare Other | Admitting: Internal Medicine

## 2017-06-11 ENCOUNTER — Encounter: Payer: Self-pay | Admitting: Internal Medicine

## 2017-06-11 ENCOUNTER — Other Ambulatory Visit (INDEPENDENT_AMBULATORY_CARE_PROVIDER_SITE_OTHER): Payer: Medicare Other

## 2017-06-11 VITALS — BP 134/86 | HR 64 | Temp 97.7°F | Ht 71.0 in | Wt 197.0 lb

## 2017-06-11 DIAGNOSIS — E785 Hyperlipidemia, unspecified: Secondary | ICD-10-CM

## 2017-06-11 DIAGNOSIS — R7302 Impaired glucose tolerance (oral): Secondary | ICD-10-CM | POA: Diagnosis not present

## 2017-06-11 DIAGNOSIS — Z86010 Personal history of colonic polyps: Secondary | ICD-10-CM

## 2017-06-11 DIAGNOSIS — Z23 Encounter for immunization: Secondary | ICD-10-CM | POA: Diagnosis not present

## 2017-06-11 DIAGNOSIS — N320 Bladder-neck obstruction: Secondary | ICD-10-CM

## 2017-06-11 DIAGNOSIS — K4090 Unilateral inguinal hernia, without obstruction or gangrene, not specified as recurrent: Secondary | ICD-10-CM | POA: Diagnosis not present

## 2017-06-11 DIAGNOSIS — R221 Localized swelling, mass and lump, neck: Secondary | ICD-10-CM

## 2017-06-11 LAB — CBC WITH DIFFERENTIAL/PLATELET
Basophils Absolute: 0.1 10*3/uL (ref 0.0–0.1)
Basophils Relative: 0.6 % (ref 0.0–3.0)
Eosinophils Absolute: 0.2 10*3/uL (ref 0.0–0.7)
Eosinophils Relative: 2.9 % (ref 0.0–5.0)
HCT: 51 % (ref 39.0–52.0)
Hemoglobin: 17.1 g/dL — ABNORMAL HIGH (ref 13.0–17.0)
Lymphocytes Relative: 35.7 % (ref 12.0–46.0)
Lymphs Abs: 3 10*3/uL (ref 0.7–4.0)
MCHC: 33.5 g/dL (ref 30.0–36.0)
MCV: 89.1 fl (ref 78.0–100.0)
Monocytes Absolute: 0.9 10*3/uL (ref 0.1–1.0)
Monocytes Relative: 11 % (ref 3.0–12.0)
Neutro Abs: 4.2 10*3/uL (ref 1.4–7.7)
Neutrophils Relative %: 49.8 % (ref 43.0–77.0)
Platelets: 205 10*3/uL (ref 150.0–400.0)
RBC: 5.72 Mil/uL (ref 4.22–5.81)
RDW: 12.7 % (ref 11.5–15.5)
WBC: 8.3 10*3/uL (ref 4.0–10.5)

## 2017-06-11 LAB — LIPID PANEL
Cholesterol: 178 mg/dL (ref 0–200)
HDL: 46.1 mg/dL (ref >39.00)
LDL Cholesterol: 110 mg/dL — ABNORMAL HIGH (ref 0–99)
NonHDL: 131.88
Total CHOL/HDL Ratio: 4
Triglycerides: 108 mg/dL (ref 0.0–149.0)
VLDL: 21.6 mg/dL (ref 0.0–40.0)

## 2017-06-11 LAB — BASIC METABOLIC PANEL
BUN: 14 mg/dL (ref 6–23)
CO2: 27 mEq/L (ref 19–32)
Calcium: 9.9 mg/dL (ref 8.4–10.5)
Chloride: 102 mEq/L (ref 96–112)
Creatinine, Ser: 1.05 mg/dL (ref 0.40–1.50)
GFR: 73.41 mL/min (ref >60.00)
Glucose, Bld: 98 mg/dL (ref 70–99)
Potassium: 4.3 mEq/L (ref 3.5–5.1)
Sodium: 138 mEq/L (ref 135–145)

## 2017-06-11 LAB — URINALYSIS, ROUTINE W REFLEX MICROSCOPIC
Bilirubin Urine: NEGATIVE
Hgb urine dipstick: NEGATIVE
Ketones, ur: NEGATIVE
Leukocytes, UA: NEGATIVE
Nitrite: NEGATIVE
Specific Gravity, Urine: >=1.03 — AB (ref 1.000–1.030)
Total Protein, Urine: NEGATIVE
Urine Glucose: NEGATIVE
Urobilinogen, UA: 0.2 (ref 0.0–1.0)
WBC, UA: NONE SEEN (ref 0–?)
pH: 5.5 (ref 5.0–8.0)

## 2017-06-11 LAB — HEPATIC FUNCTION PANEL
ALT: 53 U/L (ref 0–53)
AST: 32 U/L (ref 0–37)
Albumin: 4.6 g/dL (ref 3.5–5.2)
Alkaline Phosphatase: 49 U/L (ref 39–117)
Bilirubin, Direct: 0.2 mg/dL (ref 0.0–0.3)
Total Bilirubin: 1.1 mg/dL (ref 0.2–1.2)
Total Protein: 7.2 g/dL (ref 6.0–8.3)

## 2017-06-11 LAB — TSH: TSH: 1.33 u[IU]/mL (ref 0.35–4.50)

## 2017-06-11 LAB — PSA: PSA: 2.05 ng/mL (ref 0.10–4.00)

## 2017-06-11 LAB — HEMOGLOBIN A1C: Hgb A1c MFr Bld: 5.3 % (ref 4.6–6.5)

## 2017-06-11 MED ORDER — ZOSTER VAC RECOMB ADJUVANTED 50 MCG/0.5ML IM SUSR: 0.5000 mL | Freq: Once | INTRAMUSCULAR | 1 refills | Status: AC

## 2017-06-11 NOTE — Patient Instructions (Addendum)
You had the flu shot today, and the Tdap tetanus shot  Your shingles shot was sent to the pharmacy  You will be contacted regarding the referral for: colonoscopy, and ENT  Please continue all other medications as before, and refills have been done if requested.  Please have the pharmacy call with any other refills you may need.  Please continue your efforts at being more active, low cholesterol diet, and weight control.  You are otherwise up to date with prevention measures today.  Please keep your appointments with your specialists as you may have planned  Please go to the LAB in the Basement (turn left off the elevator) for the tests to be done today  You will be contacted by phone if any changes need to be made immediately.  Otherwise, you will receive a letter about your results with an explanation, but please check with MyChart first.  Please remember to sign up for MyChart if you have not done so, as this will be important to you in the future with finding out test results, communicating by private email, and scheduling acute appointments online when needed.  Please return in 1 year for your yearly visit, or sooner if needed, with Lab testing done 3-5 days before

## 2017-06-11 NOTE — Progress Notes (Signed)
Subjective:    Patient ID: Charles Lawson, male    DOB: [birth year], 76 y.o.   MRN: [id number]  HPI   Here to f/u; overall doing ok,  Pt denies chest pain, increasing sob or doe, wheezing, orthopnea, PND, increased LE swelling, palpitations, dizziness or syncope.  Pt denies new neurological symptoms such as new headache, or facial or extremity weakness or numbness.  Pt denies polydipsia, polyuria, or low sugar episode.  Pt states overall good compliance with meds, mostly trying to follow appropriate diet, with wt overall stable.  Does have a subq nodular mass to left lower neck, no change in size recently, NT, no overlying skin change but he is very concerned.  No other masses, recent wt loss or appetite change or pain.  Does have know asymptomatic LIH as well. Past Medical History:  Diagnosis Date  . Allergic rhinitis, cause unspecified   . Arthritis   . 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 Hyperlipidemia   . Impaired glucose tolerance 04/03/2013  . Personal history of colonic polyps 06/07/2012   Past Surgical History:  Procedure Laterality Date  . BACK SURGERY    . BASAL CELL CARCINOMA EXCISION    . COLONOSCOPY    . HERNIA REPAIR    . herniorrhaphy right    . LUMBAR LAMINECTOMY    . SPINE SURGERY     lumbar laminectomy    reports that he has never smoked. He has never used smokeless tobacco. He reports that he drinks alcohol. He reports that he does not use drugs. family history includes COPD in his mother; Cancer in his father; Lung cancer in his father. Allergies  Allergen Reactions  . Penicillins    Current Outpatient Prescriptions on File Prior to Visit  Medication Sig Dispense Refill  . aspirin 81 MG tablet Take 1 tablet (81 mg total) by mouth daily. 30 tablet 11  . ENSURE PLUS (ENSURE PLUS) LIQD Take 1 Can by  mouth 2 (two) times daily between meals. 60 Can 11  . sildenafil (REVATIO) 20 MG tablet TAKE 2 AND ONE-HALF TABLETS (50 MG TOTAL) BY MOUTH AS NEEDED FOR SEXUAL ACTIVITY 50 tablet 0  . triamcinolone cream (KENALOG) 0.1 % Apply 1 application topically 2 (two) times daily. 30 g 0   No current facility-administered medications on file prior to visit.    Review of Systems  Constitutional: Negative for other unusual diaphoresis or sweats HENT: Negative for ear discharge or swelling Eyes: Negative for other worsening visual disturbances Respiratory: Negative for stridor or other swelling  Gastrointestinal: Negative for worsening distension or other blood Genitourinary: Negative for retention or other urinary change Musculoskeletal: Negative for other MSK pain or swelling Skin: Negative for color change or other new lesions Neurological: Negative for worsening tremors and other numbness  Psychiatric/Behavioral: Negative for worsening agitation or other fatigue All other system neg per pt    Objective:   Physical Exam BP 134/86   Pulse 64   Temp 97.7 F (36.5 C) (Oral)   Ht 5\' 11"  (1.803 m)   Wt 197 lb (89.4 kg)   SpO2 99%   BMI 27.48 kg/m  VS noted,  Constitutional: Pt appears in NAD HENT: Head: NCAT.  Right Ear: External ear normal.  Left Ear: External ear normal.  Eyes: . Pupils  are equal, round, and reactive to light. Conjunctivae and EOM are normal Nose: without d/c or deformity Neck: Neck supple. Gross normal ROM Cardiovascular: Normal rate and regular rhythm.   Pulmonary/Chest: Effort normal and breath sounds without rales or wheezing.  Abd:  Soft, NT, ND, + BS, no organomegaly Neurological: Pt is alert. At baseline orientation, motor grossly intact Skin: Skin is warm. No rashes, no LE edema, but has subq smooth not hard mobile < 1/2 cm lesion left lower anterior neck Psychiatric: Pt behavior is normal without agitation  No other exam findings Lab Results  Component Value  Date   WBC 8.3 06/11/2017   HGB 17.1 (H) 06/11/2017   HCT 51.0 06/11/2017   PLT 205.0 06/11/2017   GLUCOSE 98 06/11/2017   CHOL 178 06/11/2017   TRIG 108.0 06/11/2017   HDL 46.10 06/11/2017   LDLDIRECT 138.7 02/26/2011   LDLCALC 110 (H) 06/11/2017   ALT 53 06/11/2017   AST 32 06/11/2017   NA 138 06/11/2017   K 4.3 06/11/2017   CL 102 06/11/2017   CREATININE 1.05 06/11/2017   BUN 14 06/11/2017   CO2 27 06/11/2017   TSH 1.33 06/11/2017   PSA 2.05 06/11/2017   HGBA1C 5.3 06/11/2017      Assessment & Plan:

## 2017-06-13 NOTE — Assessment & Plan Note (Signed)
stable overall by history and exam, recent data reviewed with pt, and pt to continue medical treatment as before,  to f/u any worsening symptoms or concerns  

## 2017-06-13 NOTE — Assessment & Plan Note (Signed)
Lab Results  Component Value Date   HGBA1C 5.3 06/11/2017  stable overall by history and exam, recent data reviewed with pt, and pt to continue medical treatment as before,  to f/u any worsening symptoms or concerns

## 2017-06-13 NOTE — Assessment & Plan Note (Signed)
Asympt, pt to call for surgury referral for onset pain

## 2017-06-13 NOTE — Assessment & Plan Note (Signed)
Also for psa as he is due 

## 2017-06-13 NOTE — Assessment & Plan Note (Signed)
C/w benign lesion, but he is adamant for referral for ENT

## 2017-06-25 ENCOUNTER — Encounter: Payer: Self-pay | Admitting: Internal Medicine

## 2017-06-29 DIAGNOSIS — L729 Follicular cyst of the skin and subcutaneous tissue, unspecified: Secondary | ICD-10-CM | POA: Diagnosis not present

## 2017-07-06 DIAGNOSIS — L728 Other follicular cysts of the skin and subcutaneous tissue: Secondary | ICD-10-CM | POA: Diagnosis not present

## 2017-07-06 DIAGNOSIS — L720 Epidermal cyst: Secondary | ICD-10-CM | POA: Diagnosis not present

## 2017-08-26 ENCOUNTER — Encounter: Payer: Medicare Other | Admitting: Internal Medicine

## 2017-09-22 ENCOUNTER — Ambulatory Visit (AMBULATORY_SURGERY_CENTER): Payer: Self-pay | Admitting: *Deleted

## 2017-09-22 ENCOUNTER — Other Ambulatory Visit: Payer: Self-pay

## 2017-09-22 VITALS — Ht 71.0 in | Wt 199.0 lb

## 2017-09-22 DIAGNOSIS — Z86010 Personal history of colonic polyps: Secondary | ICD-10-CM

## 2017-09-22 NOTE — Progress Notes (Signed)
No egg or soy allergy known to patient  No issues with past sedation with any surgeries  or procedures, no intubation problems  No diet pills per patient No home 02 use per patient  No blood thinners per patient  Pt denies issues with constipation  No A fib or A flutter  EMMI video sent to pt's e mail pt declined   

## 2017-10-06 ENCOUNTER — Encounter: Payer: Self-pay | Admitting: Internal Medicine

## 2017-10-06 ENCOUNTER — Ambulatory Visit (AMBULATORY_SURGERY_CENTER): Payer: Medicare Other | Admitting: Internal Medicine

## 2017-10-06 ENCOUNTER — Other Ambulatory Visit: Payer: Self-pay

## 2017-10-06 VITALS — BP 116/68 | HR 71 | Temp 97.7°F | Resp 16 | Ht 71.0 in | Wt 199.0 lb

## 2017-10-06 DIAGNOSIS — Z86010 Personal history of colonic polyps: Secondary | ICD-10-CM | POA: Diagnosis not present

## 2017-10-06 MED ORDER — SODIUM CHLORIDE 0.9 % IV SOLN: 500.0000 mL | Freq: Once | INTRAVENOUS | Status: DC

## 2017-10-06 NOTE — Patient Instructions (Addendum)
No polyps today.  I do not recommend routine repeat colonoscopy anymore.  You do have diverticulosis - thickened muscle rings and pouches in the colon wall. Please read the handout about this condition.  I appreciate the opportunity to care for you. Gatha Mayer, MD, [facility]  *Handout given to patient on diverticulosis*  YOU HAD AN ENDOSCOPIC PROCEDURE TODAY AT [facility]:   Refer to the procedure report that was given to you for any specific questions about what was found during the examination.  If the procedure report does not answer your questions, please call your gastroenterologist to clarify.  If you requested that your care partner not be given the details of your procedure findings, then the procedure report has been included in a sealed envelope for you to review at your convenience later.  YOU SHOULD EXPECT: Some feelings of bloating in the abdomen. Passage of more gas than usual.  Walking can help get rid of the air that was put into your GI tract during the procedure and reduce the bloating. If you had a lower endoscopy (such as a colonoscopy or flexible sigmoidoscopy) you may notice spotting of blood in your stool or on the toilet paper. If you underwent a bowel prep for your procedure, you may not have a normal bowel movement for a few days.  Please Note:  You might notice some irritation and congestion in your nose or some drainage.  This is from the oxygen used during your procedure.  There is no need for concern and it should clear up in a day or so.  SYMPTOMS TO REPORT IMMEDIATELY:   Following lower endoscopy (colonoscopy or flexible sigmoidoscopy):  Excessive amounts of blood in the stool  Significant tenderness or worsening of abdominal pains  Swelling of the abdomen that is new, acute  Fever of 100F or higher   For urgent or emergent issues, a gastroenterologist can be reached at any hour by calling [phone].   DIET:  We do  recommend a small meal at first, but then you may proceed to your regular diet.  Drink plenty of fluids but you should avoid alcoholic beverages for 24 hours.  ACTIVITY:  You should plan to take it easy for the rest of today and you should NOT DRIVE or use heavy machinery until tomorrow (because of the sedation medicines used during the test).    FOLLOW UP: Our staff will call the number listed on your records the next business day following your procedure to check on you and address any questions or concerns that you may have regarding the information given to you following your procedure. If we do not reach you, we will leave a message.  However, if you are feeling well and you are not experiencing any problems, there is no need to return our call.  We will assume that you have returned to your regular daily activities without incident.  If any biopsies were taken you will be contacted by phone or by letter within the next 1-3 weeks.  Please call us at [phone] if you have not heard about the biopsies in 3 weeks.    SIGNATURES/CONFIDENTIALITY: You and/or your care partner have signed paperwork which will be entered into your electronic medical record.  These signatures attest to the fact that that the information above on your After Visit Summary has been reviewed and is understood.  Full responsibility of the confidentiality of this discharge information lies with you and/or your  care-partner.

## 2017-10-06 NOTE — Progress Notes (Signed)
Report given to PACU, vss 

## 2017-10-06 NOTE — Progress Notes (Signed)
Patient consents to observer being present for procedure.   

## 2017-10-06 NOTE — Op Note (Signed)
[place] Patient Name: Charles Lawson Procedure Date: 10/06/2017 8:25 AM MRN: [id number] Endoscopist: Gatha Mayer , MD Age: 77 Referring MD:  Date of Birth: 06-Jan-1941 Gender: Male Account #: [id number] Procedure:                Colonoscopy Indications:              Surveillance: Personal history of adenomatous                            polyps on last colonoscopy 5 years ago Medicines:                Propofol per Anesthesia, Monitored Anesthesia Care Procedure:                Pre-Anesthesia Assessment:                           - Prior to the procedure, a History and Physical                            was performed, and patient medications and                            allergies were reviewed. The patient's tolerance of                            previous anesthesia was also reviewed. The risks                            and benefits of the procedure and the sedation                            options and risks were discussed with the patient.                            All questions were answered, and informed consent                            was obtained. Prior Anticoagulants: The patient has                            taken no previous anticoagulant or antiplatelet                            agents. ASA Grade Assessment: I - A normal, healthy                            patient. After reviewing the risks and benefits,                            the patient was deemed in satisfactory condition to                            undergo the procedure.  After obtaining informed consent, the colonoscope                            was passed under direct vision. Throughout the                            procedure, the patient's blood pressure, pulse, and                            oxygen saturations were monitored continuously. The                            Colonoscope was introduced through the anus and                            advanced to the  the cecum, identified by                            appendiceal orifice and ileocecal valve. The                            colonoscopy was performed without difficulty. The                            patient tolerated the procedure well. The quality                            of the bowel preparation was excellent. The bowel                            preparation used was Miralax. IC valve and                            appendiceal valve photographed, rectum omitted. Scope In: 8:36:18 AM Scope Out: 8:50:54 AM Scope Withdrawal Time: 0 hours 11 minutes 59 seconds  Total Procedure Duration: 0 hours 14 minutes 36 seconds  Findings:                 The perianal and digital rectal examinations were                            normal. Pertinent negatives include normal prostate                            (size, shape, and consistency).                           Multiple diverticula were found in the entire colon.                           The exam was otherwise without abnormality on                            direct and retroflexion views. Complications:  No immediate complications. Estimated Blood Loss:     Estimated blood loss was minimal. Impression:               - Diverticulosis in the entire examined colon.                           - The examination was otherwise normal on direct                            and retroflexion views.                           - No specimens collected. Recommendation:           - Patient has a contact number available for                            emergencies. The signs and symptoms of potential                            delayed complications were discussed with the                            patient. Return to normal activities tomorrow.                            Written discharge instructions were provided to the                            patient.                           - Resume previous diet.                           - Continue present  medications.                           - No repeat colonoscopy due to age. Had 2 sub cm                            adenomas 2013 Gatha Mayer, MD 10/06/2017 9:03:37 AM This report has been signed electronically.

## 2017-10-06 NOTE — Progress Notes (Signed)
Pt's states no medical or surgical changes since previsit or office visit. 

## 2017-10-07 ENCOUNTER — Telehealth: Payer: Self-pay

## 2017-10-07 NOTE — Telephone Encounter (Signed)
Left message on answering machine. 

## 2017-10-07 NOTE — Telephone Encounter (Signed)
  Follow up Call-  Call back number 10/06/2017  Post procedure Call Back phone  # [phone]  Permission to leave phone message Yes  Some recent data might be hidden     Patient questions:  Do you have a fever, pain , or abdominal swelling? No. Pain Score  0 *  Have you tolerated food without any problems? Yes.    Have you been able to return to your normal activities? Yes.    Do you have any questions about your discharge instructions: Diet   No. Medications  No. Follow up visit  No.  Do you have questions or concerns about your Care? No.  Actions: * If pain score is 4 or above: No action needed, pain <4.

## 2018-01-12 DIAGNOSIS — L821 Other seborrheic keratosis: Secondary | ICD-10-CM | POA: Diagnosis not present

## 2018-01-12 DIAGNOSIS — D1801 Hemangioma of skin and subcutaneous tissue: Secondary | ICD-10-CM | POA: Diagnosis not present

## 2018-01-12 DIAGNOSIS — L814 Other melanin hyperpigmentation: Secondary | ICD-10-CM | POA: Diagnosis not present

## 2018-01-12 DIAGNOSIS — D229 Melanocytic nevi, unspecified: Secondary | ICD-10-CM | POA: Diagnosis not present

## 2018-01-12 DIAGNOSIS — L570 Actinic keratosis: Secondary | ICD-10-CM | POA: Diagnosis not present

## 2018-01-12 DIAGNOSIS — Z85828 Personal history of other malignant neoplasm of skin: Secondary | ICD-10-CM | POA: Diagnosis not present

## 2018-06-13 NOTE — Progress Notes (Addendum)
Subjective:   Charles Lawson is a 77 y.o. male who presents for an Initial Medicare Annual Wellness Visit.  Review of Systems  No ROS.  Medicare Wellness Visit. Additional risk factors are reflected in the social history.  Cardiac Risk Factors include: advanced age (>85men, >8 women);dyslipidemia;male gender Sleep patterns: gets up 1-2 times nightly to void and sleeps 6-7 hours nightly.    Home Safety/Smoke Alarms: Feels safe in home. Smoke alarms in place.  Living environment; residence and Firearm Safety: 1-story house/ trailer, no firearms. Lives with wife, no needs for DME, good support system Seat Belt Safety/Bike Helmet: Wears seat belt.   PSA-  Lab Results  Component Value Date   PSA 2.05 06/11/2017   PSA 1.68 06/10/2016   PSA 1.76 05/28/2015      Objective:    Today's Vitals   06/14/18 0908  BP: 112/78  Pulse: 68  SpO2: 96%  Weight: 200 lb (90.7 kg)  Height: 5\' 11"  (1.803 m)   Body mass index is 27.89 kg/m.  Advanced Directives 06/14/2018 10/06/2017 09/22/2017  Does Patient Have a Medical Advance Directive? Yes Yes Yes  Type of Paramedic of [place];Living will [place];Living will Living will;Healthcare Power of Attorney    Current Medications (verified) Outpatient Encounter Medications as of 06/14/2018  Medication Sig  . aspirin 81 MG tablet Take 1 tablet (81 mg total) by mouth daily.  Marland Kitchen ENSURE PLUS (ENSURE PLUS) LIQD Take 1 Can by mouth 2 (two) times daily between meals.  . sildenafil (REVATIO) 20 MG tablet TAKE 2 AND ONE-HALF TABLETS (50 MG TOTAL) BY MOUTH AS NEEDED FOR SEXUAL ACTIVITY  . triamcinolone cream (KENALOG) 0.1 % Apply 1 application topically 2 (two) times daily.   No facility-administered encounter medications on file as of 06/14/2018.     Allergies (verified) Penicillins   History: Past Medical History:  Diagnosis Date  . Allergic rhinitis, cause unspecified   . Allergy   . Arthritis   .  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 Hyperlipidemia   . Impaired glucose tolerance 04/03/2013  . Personal history of colonic polyps 06/07/2012   Past Surgical History:  Procedure Laterality Date  . BACK SURGERY    . BASAL CELL CARCINOMA EXCISION    . COLONOSCOPY    . HERNIA REPAIR    . herniorrhaphy right    . LUMBAR LAMINECTOMY    . POLYPECTOMY    . SPINE SURGERY     lumbar laminectomy   Family History  Problem Relation Age of Onset  . COPD Mother   . Cancer Father   . Lung cancer Father   . Colon cancer Neg Hx   . Colon polyps Neg Hx   . Rectal cancer Neg Hx   . Stomach cancer Neg Hx    Social History   Socioeconomic History  . Marital status: Married    Spouse name: Not on file  . Number of children: 1  . Years of education: Not on file  . Highest education level: Not on file  Occupational History  . Not on file  Social Needs  . Financial resource strain: Not hard at all  . Food insecurity:    Worry: Never true    Inability: Never true  . Transportation needs:  Medical: No    Non-medical: No  Tobacco Use  . Smoking status: Never Smoker  . Smokeless tobacco: Never Used  Substance and Sexual Activity  . Alcohol use: Yes    Comment: occasional maybe once month  . Drug use: No  . Sexual activity: Yes  Lifestyle  . Physical activity:    Days per week: 2 days    Minutes per session: 50 min  . Stress: Not at all  Relationships  . Social connections:    Talks on phone: More than three times a week    Gets together: More than three times a week    Attends religious service: More than 4 times per year    Active member of club or organization: Yes    Attends meetings of clubs or organizations: More than 4 times per year    Relationship status: Married  Other Topics Concern  . Not on file    Social History Narrative  . Not on file   Tobacco Counseling Counseling given: Not Answered  Activities of Daily Living In your present state of health, do you have any difficulty performing the following activities: 06/14/2018  Hearing? N  Vision? N  Difficulty concentrating or making decisions? N  Walking or climbing stairs? N  Dressing or bathing? N  Doing errands, shopping? N  Preparing Food and eating ? N  Using the Toilet? N  In the past six months, have you accidently leaked urine? N  Do you have problems with loss of bowel control? N  Managing your Medications? N  Managing your Finances? N  Housekeeping or managing your Housekeeping? N  Some recent data might be hidden     Immunizations and Health Maintenance Immunization History  Administered Date(s) Administered  . Influenza, High Dose Seasonal PF 05/28/2015, 06/11/2017, 06/14/2018  . Influenza,inj,Quad PF,6+ Mos 05/25/2014  . Pneumococcal Conjugate-13 06/08/2014  . Pneumococcal Polysaccharide-23 01/30/2010  . Td 12/21/2006  . Tdap 06/11/2017  . Zoster 08/27/2009   There are no preventive care reminders to display for this patient.  Patient Care Team: Biagio Borg, MD as PCP - General (Internal Medicine)  Indicate any recent Medical Services you may have received from other than [facility] providers in the past year (date may be approximate).    Assessment:   This is a routine wellness examination for Chritopher. Physical assessment deferred to PCP.   Hearing/Vision screen  Visual Acuity Screening   Right eye Left eye Both eyes  Without correction:   20/40  With correction:     Comments: Education provided regarding importance of annual eye exam.  Hearing Screening Comments: Able to hear conversational tones w/o difficulty. No issues reported.  Passed whisper test  Dietary issues and exercise activities discussed: Current Exercise Habits: Home exercise routine;Structured exercise class, Type of exercise:  strength training/weights;treadmill, Time (Minutes): 50, Frequency (Times/Week): 2, Weekly Exercise (Minutes/Week): 100, Intensity: Mild, Exercise limited by: None identified  Diet (meal preparation, eat out, water intake, caffeinated beverages, dairy products, fruits and vegetables): in general, a "healthy" diet  , well balanced   Reviewed heart healthy diet. Encouraged patient to increase daily water and healthy fluid intake.  Goals    . Patient Stated     Continue to eat healthy and exercise. Enjoy family and life.      Depression Screen PHQ 2/9 Scores 06/14/2018 06/11/2017 06/10/2016 05/28/2015  PHQ - 2 Score 0 0 0 0    Fall Risk Fall Risk  06/14/2018 06/11/2017 06/10/2016 05/28/2015 05/25/2014  Falls in the past year? No No No No No   Cognitive Function:       Ad8 score reviewed for issues:  Issues making decisions: no  Less interest in hobbies / activities: no  Repeats questions, stories (family complaining): no  Trouble using ordinary gadgets (microwave, computer, phone):no  Forgets the month or year: no  Mismanaging finances: no  Remembering appts: no  Daily problems with thinking and/or memory: no Ad8 score is= 0  Screening Tests Health Maintenance  Topic Date Due  . COLONOSCOPY  10/06/2022  . TETANUS/TDAP  06/12/2027  . INFLUENZA VACCINE  Completed  . PNA vac Low Risk Adult  Completed      Plan:     I have personally reviewed and noted the following in the patient's chart:   . Medical and social history . Use of alcohol, tobacco or illicit drugs  . Current medications and supplements . Functional ability and status . Nutritional status . Physical activity . Advanced directives . List of other physicians . Vitals . Screenings to include cognitive, depression, and falls . Referrals and appointments  In addition, I have reviewed and discussed with patient certain preventive protocols, quality metrics, and best practice recommendations. A written  personalized care plan for preventive services as well as general preventive health recommendations were provided to patient.     Michiel Cowboy, RN   06/14/2018      Medical screening examination/treatment/procedure(s) were performed by non-physician practitioner and as supervising physician I was immediately available for consultation/collaboration. I agree with above. Cathlean Cower, MD

## 2018-06-14 ENCOUNTER — Ambulatory Visit (INDEPENDENT_AMBULATORY_CARE_PROVIDER_SITE_OTHER): Payer: Medicare Other | Admitting: Internal Medicine

## 2018-06-14 ENCOUNTER — Encounter: Payer: Self-pay | Admitting: Internal Medicine

## 2018-06-14 ENCOUNTER — Ambulatory Visit (INDEPENDENT_AMBULATORY_CARE_PROVIDER_SITE_OTHER): Payer: Medicare Other | Admitting: *Deleted

## 2018-06-14 ENCOUNTER — Other Ambulatory Visit (INDEPENDENT_AMBULATORY_CARE_PROVIDER_SITE_OTHER): Payer: Medicare Other

## 2018-06-14 VITALS — BP 112/78 | HR 68 | Ht 71.0 in | Wt 200.0 lb

## 2018-06-14 DIAGNOSIS — R42 Dizziness and giddiness: Secondary | ICD-10-CM | POA: Diagnosis not present

## 2018-06-14 DIAGNOSIS — N320 Bladder-neck obstruction: Secondary | ICD-10-CM

## 2018-06-14 DIAGNOSIS — Z0000 Encounter for general adult medical examination without abnormal findings: Secondary | ICD-10-CM | POA: Diagnosis not present

## 2018-06-14 DIAGNOSIS — R7302 Impaired glucose tolerance (oral): Secondary | ICD-10-CM | POA: Diagnosis not present

## 2018-06-14 DIAGNOSIS — Z23 Encounter for immunization: Secondary | ICD-10-CM | POA: Diagnosis not present

## 2018-06-14 DIAGNOSIS — E785 Hyperlipidemia, unspecified: Secondary | ICD-10-CM | POA: Diagnosis not present

## 2018-06-14 LAB — CBC WITH DIFFERENTIAL/PLATELET
Basophils Absolute: 0.1 10*3/uL (ref 0.0–0.1)
Basophils Relative: 0.9 % (ref 0.0–3.0)
Eosinophils Absolute: 0.1 10*3/uL (ref 0.0–0.7)
Eosinophils Relative: 1.9 % (ref 0.0–5.0)
HCT: 49.5 % (ref 39.0–52.0)
Hemoglobin: 17 g/dL (ref 13.0–17.0)
Lymphocytes Relative: 35.2 % (ref 12.0–46.0)
Lymphs Abs: 2.6 10*3/uL (ref 0.7–4.0)
MCHC: 34.3 g/dL (ref 30.0–36.0)
MCV: 86.2 fl (ref 78.0–100.0)
Monocytes Absolute: 0.8 10*3/uL (ref 0.1–1.0)
Monocytes Relative: 10.3 % (ref 3.0–12.0)
Neutro Abs: 3.8 10*3/uL (ref 1.4–7.7)
Neutrophils Relative %: 51.7 % (ref 43.0–77.0)
Platelets: 181 10*3/uL (ref 150.0–400.0)
RBC: 5.74 Mil/uL (ref 4.22–5.81)
RDW: 12.8 % (ref 11.5–15.5)
WBC: 7.4 10*3/uL (ref 4.0–10.5)

## 2018-06-14 LAB — URINALYSIS, ROUTINE W REFLEX MICROSCOPIC
Bilirubin Urine: NEGATIVE
Ketones, ur: NEGATIVE
Leukocytes, UA: NEGATIVE
Nitrite: NEGATIVE
Specific Gravity, Urine: >=1.03 — AB (ref 1.000–1.030)
Total Protein, Urine: NEGATIVE
Urine Glucose: NEGATIVE
Urobilinogen, UA: 0.2 (ref 0.0–1.0)
pH: 5.5 (ref 5.0–8.0)

## 2018-06-14 LAB — LIPID PANEL
Cholesterol: 161 mg/dL (ref 0–200)
HDL: 44.7 mg/dL (ref >39.00)
LDL Cholesterol: 98 mg/dL (ref 0–99)
NonHDL: 116.49
Total CHOL/HDL Ratio: 4
Triglycerides: 91 mg/dL (ref 0.0–149.0)
VLDL: 18.2 mg/dL (ref 0.0–40.0)

## 2018-06-14 LAB — BASIC METABOLIC PANEL
BUN: 18 mg/dL (ref 6–23)
CO2: 27 mEq/L (ref 19–32)
Calcium: 9.6 mg/dL (ref 8.4–10.5)
Chloride: 104 mEq/L (ref 96–112)
Creatinine, Ser: 1.01 mg/dL (ref 0.40–1.50)
GFR: 76.56 mL/min (ref >60.00)
Glucose, Bld: 100 mg/dL — ABNORMAL HIGH (ref 70–99)
Potassium: 4.4 mEq/L (ref 3.5–5.1)
Sodium: 139 mEq/L (ref 135–145)

## 2018-06-14 LAB — HEPATIC FUNCTION PANEL
ALT: 44 U/L (ref 0–53)
AST: 27 U/L (ref 0–37)
Albumin: 4.2 g/dL (ref 3.5–5.2)
Alkaline Phosphatase: 50 U/L (ref 39–117)
Bilirubin, Direct: 0.2 mg/dL (ref 0.0–0.3)
Total Bilirubin: 0.9 mg/dL (ref 0.2–1.2)
Total Protein: 7.2 g/dL (ref 6.0–8.3)

## 2018-06-14 LAB — PSA: PSA: 2.14 ng/mL (ref 0.10–4.00)

## 2018-06-14 LAB — HEMOGLOBIN A1C: Hgb A1c MFr Bld: 5.4 % (ref 4.6–6.5)

## 2018-06-14 LAB — TSH: TSH: 1.23 u[IU]/mL (ref 0.35–4.50)

## 2018-06-14 NOTE — Patient Instructions (Addendum)

## 2018-06-14 NOTE — Assessment & Plan Note (Signed)
Ok for carotid studies,  to f/u any worsening symptoms or concerns

## 2018-06-14 NOTE — Progress Notes (Signed)
Subjective:    Patient ID: Charles Lawson, male    DOB: Jan 05, 1941, 77 y.o.   MRN: [id number]  HPI  Here for yearly f/u;  Overall doing ok;  Pt denies Chest pain, worsening SOB, DOE, wheezing, orthopnea, PND, worsening LE edema, palpitations, or syncope, but has had some intermittent mild dizziness and asks for carotid screening, as brother just had stroke.  Pt denies neurological change such as new headache, facial or extremity weakness.  Pt denies polydipsia, polyuria, or low sugar symptoms. Pt states overall good compliance with treatment and medications, good tolerability, and has been trying to follow appropriate diet.  Pt denies worsening depressive symptoms, suicidal ideation or panic. No fever, night sweats, wt loss, loss of appetite, or other constitutional symptoms.  Pt states good ability with ADL's, has low fall risk, home safety reviewed and adequate, no other significant changes in hearing or vision, and only occasionally active with exercise. Brother sp cva after 80th bday, related to carotid disease.  Plans to see optho soon, last seen years ago.  Friend has prostate cancer, so he wants PSA today.  No other new complaints.  Denies urinary symptoms such as dysuria, frequency, urgency, flank pain, hematuria or n/v, fever, chills. Past Medical History:  Diagnosis Date  . Allergic rhinitis, cause unspecified   . Allergy   . Arthritis   . 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 Hyperlipidemia   . Impaired glucose tolerance 04/03/2013  . Personal history of colonic polyps 06/07/2012   Past Surgical History:  Procedure Laterality Date  . BACK SURGERY    . BASAL CELL CARCINOMA EXCISION    . COLONOSCOPY    . HERNIA REPAIR    . herniorrhaphy right    . LUMBAR LAMINECTOMY    . POLYPECTOMY    . SPINE SURGERY     lumbar  laminectomy    reports that he has never smoked. He has never used smokeless tobacco. He reports that he drinks alcohol. He reports that he does not use drugs. family history includes COPD in his mother; Cancer in his father; Lung cancer in his father. Allergies  Allergen Reactions  . Penicillins    Current Outpatient Medications on File Prior to Visit  Medication Sig Dispense Refill  . aspirin 81 MG tablet Take 1 tablet (81 mg total) by mouth daily. 30 tablet 11  . ENSURE PLUS (ENSURE PLUS) LIQD Take 1 Can by mouth 2 (two) times daily between meals. 60 Can 11  . sildenafil (REVATIO) 20 MG tablet TAKE 2 AND ONE-HALF TABLETS (50 MG TOTAL) BY MOUTH AS NEEDED FOR SEXUAL ACTIVITY 50 tablet 0  . triamcinolone cream (KENALOG) 0.1 % Apply 1 application topically 2 (two) times daily. 30 g 0   No current facility-administered medications on file prior to visit.    Review of Systems  Constitutional: Negative for other unusual diaphoresis or sweats HENT: Negative for ear discharge or swelling Eyes: Negative for other worsening visual disturbances Respiratory: Negative for stridor or other swelling  Gastrointestinal: Negative for worsening distension or other blood Genitourinary: Negative for retention or other urinary change Musculoskeletal: Negative for other MSK pain or swelling Skin: Negative for color change or other new lesions Neurological: Negative for worsening tremors and other numbness  Psychiatric/Behavioral: Negative for worsening agitation or other  fatigue All other system neg per pt    Objective:   Physical Exam BP 112/78 (BP Location: Left Arm, Patient Position: Sitting, Cuff Size: Normal)   Pulse 68   Ht 5\' 11"  (1.803 m)   Wt 200 lb (90.7 kg)   SpO2 96%   BMI 27.89 kg/m  VS noted,  Constitutional: Pt appears in NAD HENT: Head: NCAT.  Right Ear: External ear normal.  Left Ear: External ear normal.  Eyes: . Pupils are equal, round, and reactive to light. Conjunctivae  and EOM are normal Nose: without d/c or deformity Neck: Neck supple. Gross normal ROM Cardiovascular: Normal rate and regular rhythm.   Pulmonary/Chest: Effort normal and breath sounds without rales or wheezing.  Abd:  Soft, NT, ND, + BS, no organomegaly Neurological: Pt is alert. At baseline orientation, motor grossly intact Skin: Skin is warm. No rashes, other new lesions, no LE edema Psychiatric: Pt behavior is normal without agitation  No other exam findings Lab Results  Component Value Date   WBC 8.3 06/11/2017   HGB 17.1 (H) 06/11/2017   HCT 51.0 06/11/2017   PLT 205.0 06/11/2017   GLUCOSE 98 06/11/2017   CHOL 178 06/11/2017   TRIG 108.0 06/11/2017   HDL 46.10 06/11/2017   LDLDIRECT 138.7 02/26/2011   LDLCALC 110 (H) 06/11/2017   ALT 53 06/11/2017   AST 32 06/11/2017   NA 138 06/11/2017   K 4.3 06/11/2017   CL 102 06/11/2017   CREATININE 1.05 06/11/2017   BUN 14 06/11/2017   CO2 27 06/11/2017   TSH 1.33 06/11/2017   PSA 2.05 06/11/2017   HGBA1C 5.3 06/11/2017      Assessment & Plan:

## 2018-06-14 NOTE — Addendum Note (Signed)
Addended by: Biagio Borg on: 06/14/2018 09:56 AM   Modules accepted: Orders

## 2018-06-14 NOTE — Assessment & Plan Note (Signed)
stable overall by history and exam, recent data reviewed with pt, and pt to continue medical treatment as before,  to f/u any worsening symptoms or concerns. For f/u labs 

## 2018-06-14 NOTE — Assessment & Plan Note (Signed)
Also for psa as he is due 

## 2018-06-14 NOTE — Assessment & Plan Note (Signed)
stable overall by history and exam, recent data reviewed with pt, and pt to continue medical treatment as before,  to f/u any worsening symptoms or concerns  

## 2018-06-24 ENCOUNTER — Ambulatory Visit (HOSPITAL_COMMUNITY)
Admission: RE | Admit: 2018-06-24 | Discharge: 2018-06-24 | Disposition: A | Discharge status: Home / Self Care | Payer: Medicare Other | Source: Ambulatory Visit | Attending: Cardiology | Admitting: Cardiology

## 2018-06-24 DIAGNOSIS — R42 Dizziness and giddiness: Secondary | ICD-10-CM | POA: Diagnosis not present

## 2018-06-25 ENCOUNTER — Encounter: Payer: Self-pay | Admitting: Internal Medicine

## 2018-12-13 ENCOUNTER — Ambulatory Visit (INDEPENDENT_AMBULATORY_CARE_PROVIDER_SITE_OTHER): Payer: Medicare Other | Admitting: Internal Medicine

## 2018-12-13 ENCOUNTER — Encounter: Payer: Self-pay | Admitting: Internal Medicine

## 2018-12-13 ENCOUNTER — Other Ambulatory Visit: Payer: Self-pay

## 2018-12-13 VITALS — BP 130/86 | HR 95 | Temp 98.8°F | Ht 71.0 in | Wt 201.0 lb

## 2018-12-13 DIAGNOSIS — J029 Acute pharyngitis, unspecified: Secondary | ICD-10-CM | POA: Diagnosis not present

## 2018-12-13 DIAGNOSIS — H1031 Unspecified acute conjunctivitis, right eye: Secondary | ICD-10-CM

## 2018-12-13 DIAGNOSIS — R7302 Impaired glucose tolerance (oral): Secondary | ICD-10-CM | POA: Diagnosis not present

## 2018-12-13 MED ORDER — POLYMYXIN B-TRIMETHOPRIM 10000-0.1 UNIT/ML-% OP SOLN: 2.0000 [drp] | OPHTHALMIC | 0 refills | Status: AC

## 2018-12-13 MED ORDER — DOXYCYCLINE HYCLATE 100 MG PO TABS: 100.0000 mg | ORAL_TABLET | Freq: Two times a day (BID) | ORAL | 0 refills | Status: DC

## 2018-12-13 NOTE — Progress Notes (Signed)
Subjective:    Patient ID: Charles Lawson, male    DOB: [birth year], 78 y.o.   MRN: [id number]  HPI  Here with acute onset right eye red, swelling, weepy d/c and low grade temp, as well as 2-3 days acute onset facial  pressure, headache, general weakness and malaise, with mild ST and cough, but pt denies chest pain, wheezing, increased sob or doe, orthopnea, PND, increased LE swelling, palpitations, dizziness or syncope.  Pt denies new neurological symptoms such as new headache, or facial or extremity weakness or numbness   Pt denies polydipsia, polyuria.   Past Medical History:  Diagnosis Date  . Allergic rhinitis, cause unspecified   . Allergy   . Arthritis   . 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 Hyperlipidemia   . Impaired glucose tolerance 04/03/2013  . Personal history of colonic polyps 06/07/2012   Past Surgical History:  Procedure Laterality Date  . BACK SURGERY    . BASAL CELL CARCINOMA EXCISION    . COLONOSCOPY    . HERNIA REPAIR    . herniorrhaphy right    . LUMBAR LAMINECTOMY    . POLYPECTOMY    . SPINE SURGERY     lumbar laminectomy    reports that he has never smoked. He has never used smokeless tobacco. He reports current alcohol use. He reports that he does not use drugs. family history includes COPD in his mother; Cancer in his father; Lung cancer in his father. Allergies  Allergen Reactions  . Penicillins    Current Outpatient Medications on File Prior to Visit  Medication Sig Dispense Refill  . aspirin 81 MG tablet Take 1 tablet (81 mg total) by mouth daily. 30 tablet 11  . ENSURE PLUS (ENSURE PLUS) LIQD Take 1 Can by mouth 2 (two) times daily between meals. 60 Can 11  . sildenafil (REVATIO) 20 MG tablet TAKE 2 AND ONE-HALF TABLETS (50 MG TOTAL) BY MOUTH AS NEEDED FOR SEXUAL ACTIVITY 50 tablet 0   . triamcinolone cream (KENALOG) 0.1 % Apply 1 application topically 2 (two) times daily. 30 g 0   No current facility-administered medications on file prior to visit.    Review of Systems  Constitutional: Negative for other unusual diaphoresis or sweats HENT: Negative for ear discharge or swelling Eyes: Negative for other worsening visual disturbances Respiratory: Negative for stridor or other swelling  Gastrointestinal: Negative for worsening distension or other blood Genitourinary: Negative for retention or other urinary change Musculoskeletal: Negative for other MSK pain or swelling Skin: Negative for color change or other new lesions Neurological: Negative for worsening tremors and other numbness  Psychiatric/Behavioral: Negative for worsening agitation or other fatigue All other system neg per pt    Objective:   Physical Exam BP 130/86   Pulse 95   Temp 98.8 F (37.1 C) (Oral)   Ht 5\' 11"  (1.803 m)   Wt 201 lb (91.2 kg)   SpO2 93%   BMI 28.03 kg/m  VS noted, mild ill Constitutional: Pt appears in NAD HENT: Head: NCAT.  Right Ear: External ear normal.  Left Ear: External ear normal.  Eyes: . Pupils are equal, round, and reactive to light. right Conjunctivae with 2+ swelling, redness, clearish d/c and upper/lower lid mild swelling without erythena and EOM are normal Bilat tm's with  mild erythema.  Max sinus areas non tender.  Pharynx with mild erythema, no exudate Nose: without d/c or deformity Neck: Neck supple. Gross normal ROM Cardiovascular: Normal rate and regular rhythm.   Pulmonary/Chest: Effort normal and breath sounds without rales or wheezing.  Neurological: Pt is alert. At baseline orientation, motor grossly intact Skin: Skin is warm. No rashes, other new lesions, no LE edema Psychiatric: Pt behavior is normal without agitation  No other exam findings Lab Results  Component Value Date   WBC 7.4 06/14/2018   HGB 17.0 06/14/2018   HCT 49.5 06/14/2018   PLT  181.0 06/14/2018   GLUCOSE 100 (H) 06/14/2018   CHOL 161 06/14/2018   TRIG 91.0 06/14/2018   HDL 44.70 06/14/2018   LDLDIRECT 138.7 02/26/2011   LDLCALC 98 06/14/2018   ALT 44 06/14/2018   AST 27 06/14/2018   NA 139 06/14/2018   K 4.4 06/14/2018   CL 104 06/14/2018   CREATININE 1.01 06/14/2018   BUN 18 06/14/2018   CO2 27 06/14/2018   TSH 1.23 06/14/2018   PSA 2.14 06/14/2018   HGBA1C 5.4 06/14/2018        Assessment & Plan:

## 2018-12-13 NOTE — Patient Instructions (Addendum)
Please take all new medication as prescribed - the eye drops and pill antibiotic  Please continue all other medications as before, and refills have been done if requested.  Please have the pharmacy call with any other refills you may need.  Please keep your appointments with your specialists as you may have planned  Please return in 6 months, or sooner if needed

## 2018-12-13 NOTE — Assessment & Plan Note (Signed)
stable overall by history and exam, recent data reviewed with pt, and pt to continue medical treatment as before,  to f/u any worsening symptoms or concerns  

## 2018-12-13 NOTE — Assessment & Plan Note (Signed)
Mild to mod, for antibx course,  to f/u any worsening symptoms or concerns 

## 2018-12-13 NOTE — Assessment & Plan Note (Signed)
Mild to mod, for antibx course,  to f/u any worsening symptoms or concern

## 2019-03-20 DIAGNOSIS — D225 Melanocytic nevi of trunk: Secondary | ICD-10-CM | POA: Diagnosis not present

## 2019-03-20 DIAGNOSIS — D1801 Hemangioma of skin and subcutaneous tissue: Secondary | ICD-10-CM | POA: Diagnosis not present

## 2019-03-20 DIAGNOSIS — L814 Other melanin hyperpigmentation: Secondary | ICD-10-CM | POA: Diagnosis not present

## 2019-03-20 DIAGNOSIS — L570 Actinic keratosis: Secondary | ICD-10-CM | POA: Diagnosis not present

## 2019-03-20 DIAGNOSIS — L820 Inflamed seborrheic keratosis: Secondary | ICD-10-CM | POA: Diagnosis not present

## 2019-03-20 DIAGNOSIS — Z85828 Personal history of other malignant neoplasm of skin: Secondary | ICD-10-CM | POA: Diagnosis not present

## 2019-03-20 DIAGNOSIS — L821 Other seborrheic keratosis: Secondary | ICD-10-CM | POA: Diagnosis not present

## 2019-06-16 ENCOUNTER — Encounter: Payer: Self-pay | Admitting: Internal Medicine

## 2019-06-16 ENCOUNTER — Ambulatory Visit (INDEPENDENT_AMBULATORY_CARE_PROVIDER_SITE_OTHER): Payer: Medicare Other | Admitting: Internal Medicine

## 2019-06-16 ENCOUNTER — Other Ambulatory Visit: Payer: Self-pay

## 2019-06-16 ENCOUNTER — Other Ambulatory Visit (INDEPENDENT_AMBULATORY_CARE_PROVIDER_SITE_OTHER): Payer: Medicare Other

## 2019-06-16 VITALS — BP 130/82 | HR 70 | Temp 98.0°F | Ht 71.0 in | Wt 202.0 lb

## 2019-06-16 DIAGNOSIS — E538 Deficiency of other specified B group vitamins: Secondary | ICD-10-CM | POA: Diagnosis not present

## 2019-06-16 DIAGNOSIS — E611 Iron deficiency: Secondary | ICD-10-CM

## 2019-06-16 DIAGNOSIS — R7302 Impaired glucose tolerance (oral): Secondary | ICD-10-CM | POA: Diagnosis not present

## 2019-06-16 DIAGNOSIS — L600 Ingrowing nail: Secondary | ICD-10-CM

## 2019-06-16 DIAGNOSIS — Z23 Encounter for immunization: Secondary | ICD-10-CM | POA: Diagnosis not present

## 2019-06-16 DIAGNOSIS — E559 Vitamin D deficiency, unspecified: Secondary | ICD-10-CM

## 2019-06-16 DIAGNOSIS — E785 Hyperlipidemia, unspecified: Secondary | ICD-10-CM

## 2019-06-16 DIAGNOSIS — N320 Bladder-neck obstruction: Secondary | ICD-10-CM

## 2019-06-16 LAB — PSA: PSA: 3.01 ng/mL (ref 0.10–4.00)

## 2019-06-16 LAB — CBC WITH DIFFERENTIAL/PLATELET
Basophils Absolute: 0 10*3/uL (ref 0.0–0.1)
Basophils Relative: 0.5 % (ref 0.0–3.0)
Eosinophils Absolute: 0.1 10*3/uL (ref 0.0–0.7)
Eosinophils Relative: 1.7 % (ref 0.0–5.0)
HCT: 51.7 % (ref 39.0–52.0)
Hemoglobin: 17.5 g/dL — ABNORMAL HIGH (ref 13.0–17.0)
Lymphocytes Relative: 32.4 % (ref 12.0–46.0)
Lymphs Abs: 2.6 10*3/uL (ref 0.7–4.0)
MCHC: 33.8 g/dL (ref 30.0–36.0)
MCV: 88.5 fl (ref 78.0–100.0)
Monocytes Absolute: 0.8 10*3/uL (ref 0.1–1.0)
Monocytes Relative: 10.2 % (ref 3.0–12.0)
Neutro Abs: 4.4 10*3/uL (ref 1.4–7.7)
Neutrophils Relative %: 55.2 % (ref 43.0–77.0)
Platelets: 197 10*3/uL (ref 150.0–400.0)
RBC: 5.84 Mil/uL — ABNORMAL HIGH (ref 4.22–5.81)
RDW: 12.9 % (ref 11.5–15.5)
WBC: 8 10*3/uL (ref 4.0–10.5)

## 2019-06-16 LAB — HEPATIC FUNCTION PANEL
ALT: 44 U/L (ref 0–53)
AST: 31 U/L (ref 0–37)
Albumin: 4.5 g/dL (ref 3.5–5.2)
Alkaline Phosphatase: 52 U/L (ref 39–117)
Bilirubin, Direct: 0.2 mg/dL (ref 0.0–0.3)
Total Bilirubin: 1 mg/dL (ref 0.2–1.2)
Total Protein: 7.2 g/dL (ref 6.0–8.3)

## 2019-06-16 LAB — URINALYSIS, ROUTINE W REFLEX MICROSCOPIC
Bilirubin Urine: NEGATIVE
Hgb urine dipstick: NEGATIVE
Ketones, ur: NEGATIVE
Leukocytes,Ua: NEGATIVE
Nitrite: NEGATIVE
RBC / HPF: NONE SEEN (ref 0–?)
Specific Gravity, Urine: 1.02 (ref 1.000–1.030)
Total Protein, Urine: NEGATIVE
Urine Glucose: NEGATIVE
Urobilinogen, UA: 0.2 (ref 0.0–1.0)
pH: 5.5 (ref 5.0–8.0)

## 2019-06-16 LAB — VITAMIN B12: Vitamin B-12: 373 pg/mL (ref 211–911)

## 2019-06-16 LAB — BASIC METABOLIC PANEL
BUN: 12 mg/dL (ref 6–23)
CO2: 27 mEq/L (ref 19–32)
Calcium: 9.9 mg/dL (ref 8.4–10.5)
Chloride: 101 mEq/L (ref 96–112)
Creatinine, Ser: 1.1 mg/dL (ref 0.40–1.50)
GFR: 65.1 mL/min (ref >60.00)
Glucose, Bld: 98 mg/dL (ref 70–99)
Potassium: 4.5 mEq/L (ref 3.5–5.1)
Sodium: 138 mEq/L (ref 135–145)

## 2019-06-16 LAB — IBC PANEL
Iron: 144 ug/dL (ref 42–165)
Saturation Ratios: 46.5 % (ref 20.0–50.0)
Transferrin: 221 mg/dL (ref 212.0–360.0)

## 2019-06-16 LAB — LIPID PANEL
Cholesterol: 186 mg/dL (ref 0–200)
HDL: 50.6 mg/dL (ref >39.00)
LDL Cholesterol: 110 mg/dL — ABNORMAL HIGH (ref 0–99)
NonHDL: 134.95
Total CHOL/HDL Ratio: 4
Triglycerides: 126 mg/dL (ref 0.0–149.0)
VLDL: 25.2 mg/dL (ref 0.0–40.0)

## 2019-06-16 LAB — HEMOGLOBIN A1C: Hgb A1c MFr Bld: 5.4 % (ref 4.6–6.5)

## 2019-06-16 LAB — TSH: TSH: 1.46 u[IU]/mL (ref 0.35–4.50)

## 2019-06-16 LAB — VITAMIN D 25 HYDROXY (VIT D DEFICIENCY, FRACTURES): VITD: 34.78 ng/mL (ref 30.00–100.00)

## 2019-06-16 NOTE — Assessment & Plan Note (Signed)
Asympt, for psa with labs 

## 2019-06-16 NOTE — Assessment & Plan Note (Signed)
stable overall by history and exam, recent data reviewed with pt, and pt to continue medical treatment as before,  to f/u any worsening symptoms or concerns, for f/u lab 

## 2019-06-16 NOTE — Assessment & Plan Note (Signed)
Mild symptoms, declines podiatry referral for now

## 2019-06-16 NOTE — Progress Notes (Signed)
Subjective:    Patient ID: Charles Lawson, male    DOB: 1941/07/16, 78 y.o.   MRN: [id number]  HPI  Here for yearly f/u;  Overall doing ok;  Pt denies Chest pain, worsening SOB, DOE, wheezing, orthopnea, PND, worsening LE edema, palpitations, dizziness or syncope.  Pt denies neurological change such as new headache, facial or extremity weakness.  Pt denies polydipsia, polyuria, or low sugar symptoms. Pt states overall good compliance with treatment and medications, good tolerability, and has been trying to follow appropriate diet.  Pt denies worsening depressive symptoms, suicidal ideation or panic. No fever, night sweats, wt loss, loss of appetite, or other constitutional symptoms.  Pt states good ability with ADL's, has low fall risk, home safety reviewed and adequate, no other significant changes in hearing or vision, and not active with exercise.  No new complaints except for mild discomfort last wk with a right great toe ingrown nail, but no pain or redness now Past Medical History:  Diagnosis Date  . Allergic rhinitis, cause unspecified   . Allergy   . Arthritis   . 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 Hyperlipidemia   . Impaired glucose tolerance 04/03/2013  . Personal history of colonic polyps 06/07/2012   Past Surgical History:  Procedure Laterality Date  . BACK SURGERY    . BASAL CELL CARCINOMA EXCISION    . COLONOSCOPY    . HERNIA REPAIR    . herniorrhaphy right    . LUMBAR LAMINECTOMY    . POLYPECTOMY    . SPINE SURGERY     lumbar laminectomy    reports that he has never smoked. He has never used smokeless tobacco. He reports current alcohol use. He reports that he does not use drugs. family history includes COPD in his mother; Cancer in his father; Lung cancer in his father. Allergies  Allergen Reactions  .  Penicillins    Current Outpatient Medications on File Prior to Visit  Medication Sig Dispense Refill  . aspirin 81 MG tablet Take 1 tablet (81 mg total) by mouth daily. 30 tablet 11  . ENSURE PLUS (ENSURE PLUS) LIQD Take 1 Can by mouth 2 (two) times daily between meals. 60 Can 11  . sildenafil (REVATIO) 20 MG tablet TAKE 2 AND ONE-HALF TABLETS (50 MG TOTAL) BY MOUTH AS NEEDED FOR SEXUAL ACTIVITY 50 tablet 0  . triamcinolone cream (KENALOG) 0.1 % Apply 1 application topically 2 (two) times daily. 30 g 0   No current facility-administered medications on file prior to visit.    Review of Systems  Constitutional: Negative for other unusual diaphoresis or sweats HENT: Negative for ear discharge or swelling Eyes: Negative for other worsening visual disturbances Respiratory: Negative for stridor or other swelling  Gastrointestinal: Negative for worsening distension or other blood Genitourinary: Negative for retention or other urinary change Musculoskeletal: Negative for other MSK pain or swelling Skin: Negative for color change or other new lesions Neurological: Negative for worsening tremors and other numbness  Psychiatric/Behavioral: Negative for worsening agitation or other fatigue All otherwise neg per pt     Objective:   Physical Exam BP 130/82 (BP Location: Left Arm, Patient Position: Sitting, Cuff Size: Large)   Pulse 70   Temp 98 F (36.7 C) (Oral)   Ht 5\' 11"  (1.803 m)  Wt 202 lb (91.6 kg)   SpO2 95%   BMI 28.17 kg/m  VS noted,  Constitutional: Pt appears in NAD HENT: Head: NCAT.  Right Ear: External ear normal.  Left Ear: External ear normal.  Eyes: . Pupils are equal, round, and reactive to light. Conjunctivae and EOM are normal Nose: without d/c or deformity Neck: Neck supple. Gross normal ROM Cardiovascular: Normal rate and regular rhythm.   Pulmonary/Chest: Effort normal and breath sounds without rales or wheezing.  Abd:  Soft, NT, ND, + BS, no organomegaly  Neurological: Pt is alert. At baseline orientation, motor grossly intact Skin: Skin is warm. No rashes, other new lesions, no LE edema Psychiatric: Pt behavior is normal without agitation  All otherwise neg per pt Lab Results  Component Value Date   WBC 7.4 06/14/2018   HGB 17.0 06/14/2018   HCT 49.5 06/14/2018   PLT 181.0 06/14/2018   GLUCOSE 100 (H) 06/14/2018   CHOL 161 06/14/2018   TRIG 91.0 06/14/2018   HDL 44.70 06/14/2018   LDLDIRECT 138.7 02/26/2011   LDLCALC 98 06/14/2018   ALT 44 06/14/2018   AST 27 06/14/2018   NA 139 06/14/2018   K 4.4 06/14/2018   CL 104 06/14/2018   CREATININE 1.01 06/14/2018   BUN 18 06/14/2018   CO2 27 06/14/2018   TSH 1.23 06/14/2018   PSA 2.14 06/14/2018   HGBA1C 5.4 06/14/2018          Assessment & Plan:

## 2019-06-16 NOTE — Patient Instructions (Signed)
You had the flu shot today  Please call if you change your mind about podiatry referral for the ingrown nail  Please continue all other medications as before, and refills have been done if requested.  Please have the pharmacy call with any other refills you may need.  Please continue your efforts at being more active, low cholesterol diet, and weight control.  You are otherwise up to date with prevention measures today.  Please keep your appointments with your specialists as you may have planned  Please go to the LAB in the Basement (turn left off the elevator) for the tests to be done today  You will be contacted by phone if any changes need to be made immediately.  Otherwise, you will receive a letter about your results with an explanation, but please check with MyChart first.  Please remember to sign up for MyChart if you have not done so, as this will be important to you in the future with finding out test results, communicating by private email, and scheduling acute appointments online when needed.  Please return in 1 year for your yearly visit, or sooner if needed

## 2019-07-03 ENCOUNTER — Ambulatory Visit: Payer: Medicare Other

## 2019-07-03 ENCOUNTER — Other Ambulatory Visit: Payer: Self-pay | Admitting: *Deleted

## 2019-07-03 ENCOUNTER — Other Ambulatory Visit: Payer: Self-pay

## 2019-07-03 MED ORDER — SILDENAFIL CITRATE 20 MG PO TABS: ORAL_TABLET | ORAL | 0 refills | Status: DC

## 2019-09-11 ENCOUNTER — Ambulatory Visit: Payer: Medicare Other | Attending: Internal Medicine

## 2019-09-11 DIAGNOSIS — Z20822 Contact with and (suspected) exposure to covid-19: Secondary | ICD-10-CM

## 2019-09-12 LAB — NOVEL CORONAVIRUS, NAA: SARS-CoV-2, NAA: NOT DETECTED

## 2019-10-17 ENCOUNTER — Ambulatory Visit: Payer: Medicare Other

## 2019-10-27 ENCOUNTER — Ambulatory Visit: Payer: Medicare Other | Attending: Internal Medicine

## 2019-10-27 DIAGNOSIS — Z23 Encounter for immunization: Secondary | ICD-10-CM

## 2019-10-27 NOTE — Progress Notes (Signed)
   Covid-19 Vaccination Clinic  Name:  Charles Lawson    MRN: [id number] DOB: Apr 09, 1941  10/27/2019  [name] was observed post Covid-19 immunization for 15 minutes without incidence. He was provided with Vaccine Information Sheet and instruction to access the V-Safe system.   [name] was instructed to call 911 with any severe reactions post vaccine: Marland Kitchen Difficulty breathing  . Swelling of your face and throat  . A fast heartbeat  . A bad rash all over your body  . Dizziness and weakness    Immunizations Administered    Name Date Dose VIS Date Route   Pfizer COVID-19 Vaccine 10/27/2019  1:16 PM 0.3 mL 09/01/2019 Intramuscular   Manufacturer: [place]   Lot: [id number]   [place]: [id number]

## 2019-11-07 ENCOUNTER — Ambulatory Visit: Payer: Medicare Other

## 2019-11-21 ENCOUNTER — Ambulatory Visit: Payer: Medicare Other | Attending: Internal Medicine

## 2019-11-21 DIAGNOSIS — Z23 Encounter for immunization: Secondary | ICD-10-CM | POA: Insufficient documentation

## 2019-11-21 NOTE — Progress Notes (Signed)
   Covid-19 Vaccination Clinic  Name:  ABDULBASIT MCCOURY    MRN: [id number] DOB: 1941/04/17  11/21/2019  Mr. Ehresmann was observed post Covid-19 immunization for 15 minutes without incident. He was provided with Vaccine Information Sheet and instruction to access the V-Safe system.   Mr. Donisi was instructed to call 911 with any severe reactions post vaccine: Marland Kitchen Difficulty breathing  . Swelling of face and throat  . A fast heartbeat  . A bad rash all over body  . Dizziness and weakness   Immunizations Administered    Name Date Dose VIS Date Route   Pfizer COVID-19 Vaccine 11/21/2019 12:56 PM 0.3 mL 09/01/2019 Intramuscular   Manufacturer: [place]   Lot: [id number]   [place]: [id number]

## 2020-03-19 DIAGNOSIS — D1801 Hemangioma of skin and subcutaneous tissue: Secondary | ICD-10-CM | POA: Diagnosis not present

## 2020-03-19 DIAGNOSIS — L821 Other seborrheic keratosis: Secondary | ICD-10-CM | POA: Diagnosis not present

## 2020-03-19 DIAGNOSIS — C44622 Squamous cell carcinoma of skin of right upper limb, including shoulder: Secondary | ICD-10-CM | POA: Diagnosis not present

## 2020-03-19 DIAGNOSIS — D225 Melanocytic nevi of trunk: Secondary | ICD-10-CM | POA: Diagnosis not present

## 2020-03-19 DIAGNOSIS — D485 Neoplasm of uncertain behavior of skin: Secondary | ICD-10-CM | POA: Diagnosis not present

## 2020-03-19 DIAGNOSIS — L814 Other melanin hyperpigmentation: Secondary | ICD-10-CM | POA: Diagnosis not present

## 2020-03-19 DIAGNOSIS — L570 Actinic keratosis: Secondary | ICD-10-CM | POA: Diagnosis not present

## 2020-04-02 ENCOUNTER — Other Ambulatory Visit (INDEPENDENT_AMBULATORY_CARE_PROVIDER_SITE_OTHER): Payer: Medicare Other

## 2020-04-02 ENCOUNTER — Encounter: Payer: Self-pay | Admitting: Internal Medicine

## 2020-04-02 ENCOUNTER — Other Ambulatory Visit: Payer: Self-pay

## 2020-04-02 ENCOUNTER — Ambulatory Visit (INDEPENDENT_AMBULATORY_CARE_PROVIDER_SITE_OTHER): Payer: Medicare Other | Admitting: Internal Medicine

## 2020-04-02 VITALS — BP 150/80 | HR 57 | Temp 98.1°F | Ht 71.0 in | Wt 199.0 lb

## 2020-04-02 DIAGNOSIS — Z1159 Encounter for screening for other viral diseases: Secondary | ICD-10-CM

## 2020-04-02 DIAGNOSIS — L600 Ingrowing nail: Secondary | ICD-10-CM

## 2020-04-02 DIAGNOSIS — E538 Deficiency of other specified B group vitamins: Secondary | ICD-10-CM | POA: Diagnosis not present

## 2020-04-02 DIAGNOSIS — E559 Vitamin D deficiency, unspecified: Secondary | ICD-10-CM

## 2020-04-02 DIAGNOSIS — N320 Bladder-neck obstruction: Secondary | ICD-10-CM | POA: Diagnosis not present

## 2020-04-02 DIAGNOSIS — R7302 Impaired glucose tolerance (oral): Secondary | ICD-10-CM | POA: Diagnosis not present

## 2020-04-02 DIAGNOSIS — E785 Hyperlipidemia, unspecified: Secondary | ICD-10-CM | POA: Diagnosis not present

## 2020-04-02 DIAGNOSIS — R030 Elevated blood-pressure reading, without diagnosis of hypertension: Secondary | ICD-10-CM

## 2020-04-02 LAB — URINALYSIS, ROUTINE W REFLEX MICROSCOPIC
Hgb urine dipstick: NEGATIVE
Ketones, ur: NEGATIVE
Leukocytes,Ua: NEGATIVE
Nitrite: NEGATIVE
RBC / HPF: NONE SEEN (ref 0–?)
Specific Gravity, Urine: >=1.03 — AB (ref 1.000–1.030)
Total Protein, Urine: NEGATIVE
Urine Glucose: NEGATIVE
Urobilinogen, UA: 1 (ref 0.0–1.0)
pH: 5.5 (ref 5.0–8.0)

## 2020-04-02 LAB — CBC WITH DIFFERENTIAL/PLATELET
Basophils Absolute: 0.1 10*3/uL (ref 0.0–0.1)
Basophils Relative: 1.2 % (ref 0.0–3.0)
Eosinophils Absolute: 0.2 10*3/uL (ref 0.0–0.7)
Eosinophils Relative: 2.8 % (ref 0.0–5.0)
HCT: 46.3 % (ref 39.0–52.0)
Hemoglobin: 16 g/dL (ref 13.0–17.0)
Lymphocytes Relative: 36.2 % (ref 12.0–46.0)
Lymphs Abs: 2.8 10*3/uL (ref 0.7–4.0)
MCHC: 34.6 g/dL (ref 30.0–36.0)
MCV: 87.6 fl (ref 78.0–100.0)
Monocytes Absolute: 0.9 10*3/uL (ref 0.1–1.0)
Monocytes Relative: 11.4 % (ref 3.0–12.0)
Neutro Abs: 3.8 10*3/uL (ref 1.4–7.7)
Neutrophils Relative %: 48.4 % (ref 43.0–77.0)
Platelets: 208 10*3/uL (ref 150.0–400.0)
RBC: 5.29 Mil/uL (ref 4.22–5.81)
RDW: 12.9 % (ref 11.5–15.5)
WBC: 7.8 10*3/uL (ref 4.0–10.5)

## 2020-04-02 LAB — VITAMIN D 25 HYDROXY (VIT D DEFICIENCY, FRACTURES): VITD: 33.63 ng/mL (ref 30.00–100.00)

## 2020-04-02 LAB — BASIC METABOLIC PANEL
BUN: 17 mg/dL (ref 6–23)
CO2: 29 mEq/L (ref 19–32)
Calcium: 9.5 mg/dL (ref 8.4–10.5)
Chloride: 103 mEq/L (ref 96–112)
Creatinine, Ser: 1.03 mg/dL (ref 0.40–1.50)
GFR: 70.08 mL/min (ref >60.00)
Glucose, Bld: 93 mg/dL (ref 70–99)
Potassium: 3.8 mEq/L (ref 3.5–5.1)
Sodium: 139 mEq/L (ref 135–145)

## 2020-04-02 LAB — HEPATIC FUNCTION PANEL
ALT: 30 U/L (ref 0–53)
AST: 22 U/L (ref 0–37)
Albumin: 4.5 g/dL (ref 3.5–5.2)
Alkaline Phosphatase: 52 U/L (ref 39–117)
Bilirubin, Direct: 0.2 mg/dL (ref 0.0–0.3)
Total Bilirubin: 0.9 mg/dL (ref 0.2–1.2)
Total Protein: 7.1 g/dL (ref 6.0–8.3)

## 2020-04-02 LAB — LIPID PANEL
Cholesterol: 155 mg/dL (ref 0–200)
HDL: 41.9 mg/dL (ref >39.00)
NonHDL: 112.6
Total CHOL/HDL Ratio: 4
Triglycerides: 224 mg/dL — ABNORMAL HIGH (ref 0.0–149.0)
VLDL: 44.8 mg/dL — ABNORMAL HIGH (ref 0.0–40.0)

## 2020-04-02 LAB — HEMOGLOBIN A1C: Hgb A1c MFr Bld: 5.3 % (ref 4.6–6.5)

## 2020-04-02 LAB — LDL CHOLESTEROL, DIRECT: Direct LDL: 88 mg/dL

## 2020-04-02 LAB — VITAMIN B12: Vitamin B-12: 349 pg/mL (ref 211–911)

## 2020-04-02 LAB — TSH: TSH: 1.65 u[IU]/mL (ref 0.35–4.50)

## 2020-04-02 LAB — PSA: PSA: 2.69 ng/mL (ref 0.10–4.00)

## 2020-04-02 NOTE — Assessment & Plan Note (Signed)
Advised pt to check bp at home on regular basis, with goal < 140/90

## 2020-04-02 NOTE — Patient Instructions (Signed)
Please continue all other medications as before, and refills have been done if requested.  Please have the pharmacy call with any other refills you may need.  Please continue your efforts at being more active, low cholesterol diet, and weight control.  You are otherwise up to date with prevention measures today.  Please keep your appointments with your specialists as you may have planned  You will be contacted regarding the referral for: podiatry  Please monitor your BP at home and return for BP more often > 140/90  Please go to the LAB at the blood drawing area for the tests to be done  You will be contacted by phone if any changes need to be made immediately.  Otherwise, you will receive a letter about your results with an explanation, but please check with MyChart first.  Please remember to sign up for MyChart if you have not done so, as this will be important to you in the future with finding out test results, communicating by private email, and scheduling acute appointments online when needed.  Please make an Appointment to return in 6 months, or sooner if needed

## 2020-04-02 NOTE — Assessment & Plan Note (Signed)
Asympt, for psa with labs 

## 2020-04-02 NOTE — Assessment & Plan Note (Signed)
stable overall by history and exam, recent data reviewed with pt, and pt to continue medical treatment as before,  to f/u any worsening symptoms or concerns, for labs

## 2020-04-02 NOTE — Assessment & Plan Note (Addendum)
For podiatry referral  I spent 31 minutes in preparing to see the patient by review of recent labs, imaging and procedures, obtaining and reviewing separately obtained history, communicating with the patient and family or caregiver, ordering medications, tests or procedures, and documenting clinical information in the EHR including the differential Dx, treatment, and any further evaluation and other management of ingrown nail, elev bp, hld, hyperglycemia, bladder neck obstruct

## 2020-04-02 NOTE — Assessment & Plan Note (Signed)
stable overall by history and exam, recent data reviewed with pt, and pt to continue medical treatment as before,  to f/u any worsening symptoms or concerns  

## 2020-04-02 NOTE — Progress Notes (Signed)
Subjective:    Patient ID: Charles Lawson, male    DOB: 02-15-1941, 79 y.o.   MRN: [id number]  HPI Here for yearlly f/u;  Overall doing ok;  Pt denies Chest pain, worsening SOB, DOE, wheezing, orthopnea, PND, worsening LE edema, palpitations, dizziness or syncope.  Pt denies neurological change such as new headache, facial or extremity weakness.  Pt denies polydipsia, polyuria, or low sugar symptoms. Pt states overall good compliance with treatment and medications, good tolerability, and has been trying to follow appropriate diet.  Pt denies worsening depressive symptoms, suicidal ideation or panic. No fever, night sweats, wt loss, loss of appetite, or other constitutional symptoms.  Pt states good ability with ADL's, has low fall risk, home safety reviewed and adequate, no other significant changes in hearing or vision, and only occasionally active with exercise.  Has 2 ingrown toenails, needs podiatry referral.  Has not check BP recenlty but was less than 140/90 a few months ago Past Medical History:  Diagnosis Date  . Allergic rhinitis, cause unspecified   . Allergy   . Arthritis   . 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 Hyperlipidemia   . Impaired glucose tolerance 04/03/2013  . Personal history of colonic polyps 06/07/2012   Past Surgical History:  Procedure Laterality Date  . BACK SURGERY    . BASAL CELL CARCINOMA EXCISION    . COLONOSCOPY    . HERNIA REPAIR    . herniorrhaphy right    . LUMBAR LAMINECTOMY    . POLYPECTOMY    . SPINE SURGERY     lumbar laminectomy    reports that he has never smoked. He has never used smokeless tobacco. He reports current alcohol use. He reports that he does not use drugs. family history includes COPD in his mother; Cancer in his father; Lung cancer in his father. Allergies  Allergen  Reactions  . Penicillins    Current Outpatient Medications on File Prior to Visit  Medication Sig Dispense Refill  . aspirin 81 MG tablet Take 1 tablet (81 mg total) by mouth daily. 30 tablet 11  . ENSURE PLUS (ENSURE PLUS) LIQD Take 1 Can by mouth 2 (two) times daily between meals. 60 Can 11  . sildenafil (REVATIO) 20 MG tablet TAKE 2 AND ONE-HALF TABLETS (50 MG TOTAL) BY MOUTH AS NEEDED FOR SEXUAL ACTIVITY 25 tablet 0  . triamcinolone cream (KENALOG) 0.1 % Apply 1 application topically 2 (two) times daily. 30 g 0   No current facility-administered medications on file prior to visit.   Review of Systems All otherwise neg per pt    Objective:   Physical Exam BP (!) 150/80 (BP Location: Left Arm, Patient Position: Sitting, Cuff Size: Large)   Pulse (!) 57   Temp 98.1 F (36.7 C) (Oral)   Ht 5\' 11"  (1.803 m)   Wt 199 lb (90.3 kg)   SpO2 96%   BMI 27.75 kg/m  VS noted,  Constitutional: Pt appears in NAD HENT: Head: NCAT.  Right Ear: External ear normal.  Left Ear: External ear normal.  Eyes: . Pupils are equal, round, and reactive to light. Conjunctivae and EOM are normal Nose: without d/c or deformity Neck: Neck supple. Gross normal ROM Cardiovascular: Normal rate and regular rhythm.   Pulmonary/Chest: Effort normal and breath sounds  without rales or wheezing.  Abd:  Soft, NT, ND, + BS, no organomegaly Neurological: Pt is alert. At baseline orientation, motor grossly intact Skin: Skin is warm. No rashes, other new lesions, no LE edema but has left great toe and right second toe ingrown nails Psychiatric: Pt behavior is normal without agitation  All otherwise neg per pt Lab Results  Component Value Date   WBC 7.8 04/02/2020   HGB 16.0 04/02/2020   HCT 46.3 04/02/2020   PLT 208.0 04/02/2020   GLUCOSE 93 04/02/2020   CHOL 155 04/02/2020   TRIG 224.0 (H) 04/02/2020   HDL 41.90 04/02/2020   LDLDIRECT 88.0 04/02/2020   LDLCALC 110 (H) 06/16/2019   ALT 30 04/02/2020   AST  22 04/02/2020   NA 139 04/02/2020   K 3.8 04/02/2020   CL 103 04/02/2020   CREATININE 1.03 04/02/2020   BUN 17 04/02/2020   CO2 29 04/02/2020   TSH 1.65 04/02/2020   PSA 2.69 04/02/2020   HGBA1C 5.3 04/02/2020         Assessment & Plan:

## 2020-04-03 LAB — HEPATITIS C ANTIBODY
Hepatitis C Ab: NONREACTIVE
SIGNAL TO CUT-OFF: 0.01 (ref <1.00)

## 2020-04-04 ENCOUNTER — Encounter: Payer: Self-pay | Admitting: Internal Medicine

## 2020-04-09 ENCOUNTER — Ambulatory Visit (INDEPENDENT_AMBULATORY_CARE_PROVIDER_SITE_OTHER): Payer: Medicare Other

## 2020-04-09 ENCOUNTER — Other Ambulatory Visit: Payer: Self-pay | Admitting: Podiatry

## 2020-04-09 ENCOUNTER — Other Ambulatory Visit: Payer: Self-pay

## 2020-04-09 ENCOUNTER — Ambulatory Visit (INDEPENDENT_AMBULATORY_CARE_PROVIDER_SITE_OTHER): Payer: Medicare Other | Admitting: Podiatry

## 2020-04-09 ENCOUNTER — Encounter: Payer: Self-pay | Admitting: Podiatry

## 2020-04-09 VITALS — BP 159/82 | HR 66 | Temp 98.0°F | Resp 16

## 2020-04-09 DIAGNOSIS — M79672 Pain in left foot: Secondary | ICD-10-CM

## 2020-04-09 DIAGNOSIS — L600 Ingrowing nail: Secondary | ICD-10-CM | POA: Diagnosis not present

## 2020-04-09 DIAGNOSIS — S90451A Superficial foreign body, right great toe, initial encounter: Secondary | ICD-10-CM

## 2020-04-09 DIAGNOSIS — M2042 Other hammer toe(s) (acquired), left foot: Secondary | ICD-10-CM

## 2020-04-09 DIAGNOSIS — M2041 Other hammer toe(s) (acquired), right foot: Secondary | ICD-10-CM

## 2020-04-09 DIAGNOSIS — M2012 Hallux valgus (acquired), left foot: Secondary | ICD-10-CM | POA: Diagnosis not present

## 2020-04-09 DIAGNOSIS — M79671 Pain in right foot: Secondary | ICD-10-CM

## 2020-04-09 DIAGNOSIS — M2011 Hallux valgus (acquired), right foot: Secondary | ICD-10-CM | POA: Diagnosis not present

## 2020-04-09 MED ORDER — NEOMYCIN-POLYMYXIN-HC 3.5-10000-1 OT SOLN
OTIC | 0 refills | Status: DC
Start: 2020-04-09 — End: 2020-06-20

## 2020-04-09 NOTE — Patient Instructions (Addendum)
Soak Instructions    THE DAY AFTER THE PROCEDURE  Place 1/4 cup of epsom salts in a quart of warm tap water.  Submerge your foot or feet with outer bandage intact for the initial soak; this will allow the bandage to become moist and wet for easy lift off.  Once you remove your bandage, continue to soak in the solution for 20 minutes.  This soak should be done twice a day.  Next, remove your foot or feet from solution, blot dry the affected area and cover.  You may use a band aid large enough to cover the area or use gauze and tape.  Apply other medications to the area as directed by the doctor such as polysporin neosporin.  IF YOUR SKIN BECOMES IRRITATED WHILE USING THESE INSTRUCTIONS, IT IS OKAY TO SWITCH TO  WHITE VINEGAR AND WATER. Or you may use antibacterial soap and water to keep the toe clean  Monitor for any signs/symptoms of infection. Call the office immediately if any occur or go directly to the emergency room. Call with any questions/concerns.    [facility] Instructions-Post Nail Surgery  You have had your ingrown toenail and root treated with a chemical.  This chemical causes a burn that will drain and ooze like a blister.  This can drain for 6-8 weeks or longer.  It is important to keep this area clean, covered, and follow the soaking instructions dispensed at the time of your surgery.  This area will eventually dry and form a scab.  Once the scab forms you no longer need to soak or apply a dressing.  If at any time you experience an increase in pain, redness, swelling, or drainage, you should contact the office as soon as possible.   Hammer Toe  Hammer toe is a change in the shape (a deformity) of your toe. The deformity causes the middle joint of your toe to stay bent. This causes pain, especially when you are wearing shoes. Hammer toe starts gradually. At first, the toe can be straightened. Gradually over time, the deformity becomes stiff and permanent. Early treatments to  keep the toe straight may relieve pain. As the deformity becomes stiff and permanent, surgery may be needed to straighten the toe. What are the causes? Hammer toe is caused by abnormal bending of the toe joint that is closest to your foot. It happens gradually over time. This pulls on the muscles and connections (tendons) of the toe joint, making them weak and stiff. It is often related to wearing shoes that are too short or narrow and do not let your toes straighten. What increases the risk? You may be at greater risk for hammer toe if you:  Are male.  Are older.  Wear shoes that are too small.  Wear high-heeled shoes that pinch your toes.  Are a Engineer, mining.  Have a second toe that is longer than your big toe (first toe).  Injure your foot or toe.  Have arthritis.  Have a family history of hammer toe.  Have a nerve or muscle disorder. What are the signs or symptoms? The main symptoms of this condition are pain and deformity of the toe. The pain is worse when wearing shoes, walking, or running. Other symptoms may include:  Corns or calluses over the bent part of the toe or between the toes.  Redness and a burning feeling on the toe.  An open sore that forms on the top of the toe.  Not being  able to straighten the toe. How is this diagnosed? This condition is diagnosed based on your symptoms and a physical exam. During the exam, your health care provider will try to straighten your toe to see how stiff the deformity is. You may also have tests, such as:  A blood test to check for rheumatoid arthritis.  An X-ray to show how severe the deformity is. How is this treated? Treatment for this condition will depend on how stiff the deformity is. Surgery is often needed. However, sometimes a hammer toe can be straightened without surgery. Treatments that do not involve surgery include:  Taping the toe into a straightened position.  Using pads and cushions to protect the toe  (orthotics).  Wearing shoes that provide enough room for the toes.  Doing toe-stretching exercises at home.  Taking an NSAID to reduce pain and swelling. If these treatments do not help or the toe cannot be straightened, surgery is the next option. The most common surgeries used to straighten a hammer toe include:  Arthroplasty. In this procedure, part of the joint is removed, and that allows the toe to straighten.  Fusion. In this procedure, cartilage between the two bones of the joint is taken out and the bones are fused together into one longer bone.  Implantation. In this procedure, part of the bone is removed and replaced with an implant to let the toe move again.  Flexor tendon transfer. In this procedure, the tendons that curl the toes down (flexor tendons) are repositioned. Follow these instructions at home:  Take over-the-counter and prescription medicines only as told by your health care provider.  Do toe straightening and stretching exercises as told by your health care provider.  Keep all follow-up visits as told by your health care provider. This is important. How is this prevented?  Wear shoes that give your toes enough room and do not cause pain.  Do not wear high-heeled shoes. Contact a health care provider if:  Your pain gets worse.  Your toe becomes red or swollen.  You develop an open sore on your toe. This information is not intended to replace advice given to you by your health care provider. Make sure you discuss any questions you have with your health care provider. Document Revised: 08/20/2017 Document Reviewed: 01/01/2016 Elsevier Patient Education  [place]  A bunion is a bump on the base of the big toe that forms when the bones of the big toe joint move out of position. Bunions may be small at first, but they often get larger over time. They can make walking painful. What are the causes? A bunion may be caused by:  Wearing  narrow or pointed shoes that force the big toe to press against the other toes.  Abnormal foot development that causes the foot to roll inward (pronate).  Changes in the foot that are caused by certain diseases, such as rheumatoid arthritis or polio.  A foot injury. What increases the risk? The following factors may make you more likely to develop this condition:  Wearing shoes that squeeze the toes together.  Having certain diseases, such as: ? Rheumatoid arthritis. ? Polio. ? Cerebral palsy.  Having family members who have bunions.  Being born with a foot deformity, such as flat feet or low arches.  Doing activities that put a lot of pressure on the feet, such as ballet dancing. What are the signs or symptoms? The main symptom of a bunion is a  noticeable bump on the big toe. Other symptoms may include:  Pain.  Swelling around the big toe.  Redness and inflammation.  Thick or hardened skin on the big toe or between the toes.  Stiffness or loss of motion in the big toe.  Trouble with walking. How is this diagnosed? A bunion may be diagnosed based on your symptoms, medical history, and activities. You may have tests, such as:  X-rays. These allow your health care provider to check the position of the bones in your foot and look for damage to your joint. They also help your health care provider determine the severity of your bunion and the best way to treat it.  Joint aspiration. In this test, a sample of fluid is removed from the toe joint. This test may be done if you are in a lot of pain. It helps rule out diseases that cause painful swelling of the joints, such as arthritis. How is this treated? Treatment depends on the severity of your symptoms. The goal of treatment is to relieve symptoms and prevent the bunion from getting worse. Your health care provider may recommend:  Wearing shoes that have a wide toe box.  Using bunion pads to cushion the affected  area.  Taping your toes together to keep them in a normal position.  Placing a device inside your shoe (orthotics) to help reduce pressure on your toe joint.  Taking medicine to ease pain, inflammation, and swelling.  Applying heat or ice to the affected area.  Doing stretching exercises.  Surgery to remove scar tissue and move the toes back into their normal position. This treatment is rare. Follow these instructions at home: Managing pain, stiffness, and swelling   If directed, put ice on the painful area: ? Put ice in a plastic bag. ? Place a towel between your skin and the bag. ? Leave the ice on for 20 minutes, 2-3 times a day. Activity   If directed, apply heat to the affected area before you exercise. Use the heat source that your health care provider recommends, such as a moist heat pack or a heating pad. ? Place a towel between your skin and the heat source. ? Leave the heat on for 20-30 minutes. ? Remove the heat if your skin turns bright red. This is especially important if you are unable to feel pain, heat, or cold. You may have a greater risk of getting burned.  Do exercises as told by your health care provider. General instructions  Support your toe joint with proper footwear, shoe padding, or taping as told by your health care provider.  Take over-the-counter and prescription medicines only as told by your health care provider.  Keep all follow-up visits as told by your health care provider. This is important. Contact a health care provider if your symptoms:  Get worse.  Do not improve in 2 weeks. Get help right away if you have:  Severe pain and trouble with walking. Summary  A bunion is a bump on the base of the big toe that forms when the bones of the big toe joint move out of position.  Bunions can make walking painful.  Treatment depends on the severity of your symptoms.  Support your toe joint with proper footwear, shoe padding, or taping as  told by your health care provider. This information is not intended to replace advice given to you by your health care provider. Make sure you discuss any questions you have with your health care  provider. Document Revised: 03/14/2018 Document Reviewed: 01/18/2018 Elsevier Patient Education  [place].

## 2020-04-09 NOTE — Progress Notes (Signed)
Subjective:  Patient ID: Charles Lawson, male    DOB: May 14, 1941,  MRN: [id number]  Chief Complaint  Patient presents with  . Nail Problem    Bilateral; Hallux-both sides; pt stated, "Today, nails do not hurt; but do feel sensitive"; x1 yr    79 y.o. male presents with the above complaint. History confirmed with patient.  He has had ingrowing nails on both great toes for some time.  He had the left great toe previously removed once before and grew back but still present on the lateral corner.  The left great toe both corners hurt and is severely painful.  Does not think they have been infected before.  Objective:  Physical Exam: warm, good capillary refill, no trophic changes or ulcerative lesions, normal DP and PT pulses and normal sensory exam.  Varicose veins present bilaterally Left Foot: Hallux abductus interphalangeus present with an overlapping second digital contracture, mycotic and pincer nail deformity with incurvated and ingrowing borders without paronychia medial and lateral hallux borders.  Pain with dorsal translation of the second toe at the metatarsophalangeal joint, mild laxity Right Foot: Hallux abductus interphalangeus is present, less severe than the left side, digital contractures present but the toe does not overlap the hallux.  Mycotic hallux nail with slightly incurvated border on the lateral side and severe nail dystrophy  No images are attached to the encounter.  Radiographs: X-ray of both feet: Hallux abductus interphalangeus present with digital contractures 2 through 5, the worst of which is the left second which slightly overlaps the hallux, bilateral deviation of the second and third metatarsal phalangeal joints medially, incidental finding of a foreign body in the plantar soft tissue of the right hallux, bilateral second metatarsal is the longest Assessment:   1. Pain in both feet   2. Hammertoe of right foot   3. Hammertoe of left foot   4. Ingrown toenail  of both feet   5. Hallux interphalangeus, acquired, left   6. Hallux interphalangeus, acquired, right   7. Foreign body of skin of right great toe      Plan:  Patient was evaluated and treated and all questions answered.   -Patient elects to proceed with ingrown toenail removal today -Ingrown nail excised. See procedure note. -Educated on post-procedure care including soaking. Written instructions provided. -Rx for Cortisporin drops -Patient to follow up in 2 weeks for nail check.  Procedure: Excision of Ingrown Toenail Location: Left 1st toe Lateral and medial  nail borders. Anesthesia: Lidocaine 1% plain; 1.47mL and Marcaine 0.5% plain; 1.75mL, digital block. Skin Prep: Betadine. Dressing: Silvadene; telfa; dry, sterile, compression dressing. Technique: Following skin prep, the toe was exsanguinated and a tourniquet was secured at the base of the toe. The affected nail border was freed, split with a nail splitter, and excised. Chemical matrixectomy was then performed with phenol and irrigated out with alcohol. The tourniquet was then removed and sterile dressing applied. Disposition: Patient tolerated procedure well. Patient to return in 2 weeks for follow-up.  -We also notes treatment for his digital contractures and overlapping left second toe.  I do not think toe strapping will help with this as it is a chronic and severe deformity.  He does have some evidence of plantar plate disruption of the second toe and potentially the third as well with abducted digital contractures.  Additionally he has hallux abductus interphalangeus, he does not have deformity at the metatarsal phalangeal joint.  Surgically we discussed the following procedures: Akin osteotomy, Weil osteotomy of  second and third metatarsals, hammertoe correction of the second and third toes, plantar plate repairs of potentially the second and third toes.  He will consider this and we will discuss in the future at his next visit  after his nail avulsion site has healed up  -I also discussed with him that he has a plantar foreign body in the hallux soft tissues, this appears to be a needle or steel filament wire.  He does not recall when this happened, and there is no evidence of a puncture wound or foreign body entry point on exam today.  This is likely an incidental finding and happened a significant time in the past and is not symptomatic for him.  If it is symptomatic in future we will excise it.  Return in about 2 weeks (around 04/23/2020) for nail re-check.

## 2020-04-25 ENCOUNTER — Ambulatory Visit (INDEPENDENT_AMBULATORY_CARE_PROVIDER_SITE_OTHER): Payer: Medicare Other | Admitting: Podiatry

## 2020-04-25 ENCOUNTER — Other Ambulatory Visit: Payer: Self-pay

## 2020-04-25 DIAGNOSIS — M2042 Other hammer toe(s) (acquired), left foot: Secondary | ICD-10-CM

## 2020-04-25 DIAGNOSIS — L600 Ingrowing nail: Secondary | ICD-10-CM | POA: Diagnosis not present

## 2020-04-25 DIAGNOSIS — M2012 Hallux valgus (acquired), left foot: Secondary | ICD-10-CM | POA: Diagnosis not present

## 2020-04-25 DIAGNOSIS — M79671 Pain in right foot: Secondary | ICD-10-CM | POA: Diagnosis not present

## 2020-04-25 DIAGNOSIS — M79672 Pain in left foot: Secondary | ICD-10-CM | POA: Diagnosis not present

## 2020-04-26 NOTE — Progress Notes (Signed)
  Subjective:  Patient ID: Charles Lawson, male    DOB: February 27, 1941,  MRN: [id number]  Chief Complaint  Patient presents with  . Nail Problem    2 week nail check    79 y.o. male presents with the above complaint. History confirmed with patient.  Feels well no pain in the toenail.  It is healing well.  Inquires about surgical treatment for his overlapping 2nd toe on the left side  Objective:  Physical Exam: warm, good capillary refill, no trophic changes or ulcerative lesions, normal DP and PT pulses and normal sensory exam.  Varicose veins present bilaterally Left Foot: Hallux abductus interphalangeus present with an overlapping second digital contracture, pain with dorsal translation of the second toe at the metatarsophalangeal joint, mild laxity  Nail avulsion matricectomy site is healing well.  No drainage no signs of infection.  Radiographs: X-ray of both feet: Hallux abductus interphalangeus present with digital contractures 2 through 5, the worst of which is the left second which slightly overlaps the hallux, bilateral deviation of the second and third metatarsal phalangeal joints medially, incidental finding of a foreign body in the plantar soft tissue of the right hallux, bilateral second metatarsal is the longest Assessment:   1. Pain in both feet   2. Hammertoe of left foot   3. Hallux interphalangeus, acquired, left   4. Ingrowing toenail without infection      Plan:  Patient was evaluated and treated and all questions answered.   -Toenail avulsion site is healing well.  May leave OTA and does not need bandages at this point -We again discussed surgical correction of his left foot deformities.  We reviewed the following surgical procedures: Akin osteotomy of the hallux proximal phalanx, repair of dislocating 2nd and 3rd metatarsophalangeal joint with possible plantar plate repair, Weil osteotomies of 2nd 3rd metatarsals, digital fusions of the 2nd and 3rd proximal  interphalangeal joints.  We discussed all the risk, benefits and potential complications as well as the expected postoperative course.  He would like to plan for surgery in mid October.  Informed consent was reviewed and signed today in the office.  He will return in mid September for final preoperative visit. -CAM boot to be dispensed at next visit.  He will meet with our surgery scheduler Shelly Rubenstein at that time.   Surgical plan:  Procedure: -#1 left foot Akin osteotomy #2 digital PIPJ arthrodeses 2, 3 #3 possible plantar plate repair and open repair of dislocation of metatarsophalangeal joint, 2,3 #4 Weil osteotomy 2nd and 3rd metatarsals  Location: -[facility]  Anesthesia plan: -IV sedation with local anesthesia  Postoperative pain plan: - Tylenol 1000 mg every 6 hours, ibuprofen 600 mg every 6 hours, gabapentin 300 mg every 8 hours x5 days, oxycodone 5 mg 1-2 tabs every 6 hours only as needed  DVT prophylaxis: -Aspirin 325 mg twice daily  WB Restrictions / DME needs: -WBAT in CAM boot    No follow-ups on file.

## 2020-06-05 DIAGNOSIS — H5203 Hypermetropia, bilateral: Secondary | ICD-10-CM | POA: Diagnosis not present

## 2020-06-05 DIAGNOSIS — H524 Presbyopia: Secondary | ICD-10-CM | POA: Diagnosis not present

## 2020-06-05 DIAGNOSIS — H52223 Regular astigmatism, bilateral: Secondary | ICD-10-CM | POA: Diagnosis not present

## 2020-06-05 DIAGNOSIS — H2513 Age-related nuclear cataract, bilateral: Secondary | ICD-10-CM | POA: Diagnosis not present

## 2020-06-06 ENCOUNTER — Other Ambulatory Visit: Payer: Self-pay

## 2020-06-06 ENCOUNTER — Ambulatory Visit (INDEPENDENT_AMBULATORY_CARE_PROVIDER_SITE_OTHER): Payer: Medicare Other | Admitting: Podiatry

## 2020-06-06 DIAGNOSIS — M2042 Other hammer toe(s) (acquired), left foot: Secondary | ICD-10-CM

## 2020-06-06 DIAGNOSIS — R52 Pain, unspecified: Secondary | ICD-10-CM | POA: Diagnosis not present

## 2020-06-06 DIAGNOSIS — M2012 Hallux valgus (acquired), left foot: Secondary | ICD-10-CM

## 2020-06-06 DIAGNOSIS — M21622 Bunionette of left foot: Secondary | ICD-10-CM

## 2020-06-06 NOTE — Progress Notes (Signed)
  Subjective:  Patient ID: Charles Lawson, male    DOB: [birth year],  MRN: [id number]  Chief Complaint  Patient presents with  . Consult    surgical consult left foot    79 y.o. male returns with the above complaint. History confirmed with patient.  Here to schedule surgery for late October for his left foot complaints, most notably is deviated hallux, second, and third toes.  He also has a new complaint of a painful bump on the outside of the left foot with a hard corn on the bottom.  Objective:  Physical Exam: warm, good capillary refill, no trophic changes or ulcerative lesions, normal DP and PT pulses and normal sensory exam.  Varicose veins present bilaterally Left Foot: Hallux abductus interphalangeus present with an overlapping second digital contracture, pain with dorsal translation of the second toe at the metatarsophalangeal joint, mild laxity, on the lateral fifth MTPJ there is a palpable prominent bone laterally and plantarly with a porokeratosis plantar under the fifth metatarsal head    Radiographs: X-ray of both feet: Hallux abductus interphalangeus present with digital contractures 2 through 5, the worst of which is the left second which slightly overlaps the hallux, bilateral deviation of the second and third metatarsal phalangeal joints medially, incidental finding of a foreign body in the plantar soft tissue of the right hallux, bilateral second metatarsal is the longest Assessment:   1. Hammertoe of left foot   2. Hallux interphalangeus, acquired, left   3. Pain   4. Tailor's bunion of left foot      Plan:  Patient was evaluated and treated and all questions answered.    -We again discussed surgical correction of his left foot deformities.  We reviewed the following surgical procedures: Akin osteotomy of the hallux proximal phalanx, repair of dislocating 2nd and 3rd metatarsophalangeal joint with possible plantar plate repair, Weil osteotomies of 2nd 3rd  metatarsals, digital fusions of the 2nd and 3rd proximal interphalangeal joints.  Today we also discussed that the porokeratosis is likely secondary to tailor's bunion formation.  We reviewed his most recent radiographs and his 4 5 IM angle is relatively well aligned but there is a large exostosis on the lateral and plantar fifth metatarsal head.  I recommended surgical excision of the exostoses as well as condylectomy of the plantar fifth metatarsal head.  I discussed with him that I can do this through a small incision using a minimally invasive approach.  -Informed consent was previously signed.  We reviewed this and added the procedure of the exostectomy of the fifth metatarsal head and he agreed to the procedure and initialed this.  He met with our surgery scheduler and surgery scheduled for October 29.  -A CAM boot was dispensed.   Surgical plan:  Procedure: -#1 left foot Akin osteotomy #2 digital PIPJ arthrodeses 2, 3 #3 possible plantar plate repair and open repair of dislocation of metatarsophalangeal joint, 2,3 #4 Weil osteotomy 2nd and 3rd metatarsals #5 exostectomy and condylectomy left fifth metatarsal head  Location: -[place]  Anesthesia plan: -IV sedation with local anesthesia  Postoperative pain plan: - Tylenol 1000 mg every 6 hours, ibuprofen 600 mg every 6 hours, gabapentin 300 mg every 8 hours x5 days, oxycodone 5 mg 1-2 tabs every 6 hours only as needed  DVT prophylaxis: -Aspirin 325 mg twice daily  WB Restrictions / DME needs: -WBAT in CAM boot

## 2020-06-20 ENCOUNTER — Encounter: Payer: Self-pay | Admitting: Internal Medicine

## 2020-06-20 ENCOUNTER — Other Ambulatory Visit: Payer: Self-pay

## 2020-06-20 ENCOUNTER — Ambulatory Visit (INDEPENDENT_AMBULATORY_CARE_PROVIDER_SITE_OTHER): Payer: Medicare Other | Admitting: Internal Medicine

## 2020-06-20 VITALS — BP 132/78 | HR 60 | Temp 98.1°F | Ht 71.0 in | Wt 202.0 lb

## 2020-06-20 DIAGNOSIS — R7302 Impaired glucose tolerance (oral): Secondary | ICD-10-CM | POA: Diagnosis not present

## 2020-06-20 DIAGNOSIS — Z23 Encounter for immunization: Secondary | ICD-10-CM

## 2020-06-20 DIAGNOSIS — E785 Hyperlipidemia, unspecified: Secondary | ICD-10-CM

## 2020-06-20 DIAGNOSIS — R030 Elevated blood-pressure reading, without diagnosis of hypertension: Secondary | ICD-10-CM

## 2020-06-20 DIAGNOSIS — L600 Ingrowing nail: Secondary | ICD-10-CM | POA: Diagnosis not present

## 2020-06-20 NOTE — Progress Notes (Signed)
Subjective:    Patient ID: Charles Lawson, male    DOB: [birth year], 79 y.o.   MRN: [id number]  HPI     Here for yearly f/u;  Overall doing ok;  Pt denies Chest pain, worsening SOB, DOE, wheezing, orthopnea, PND, worsening LE edema, palpitations, dizziness or syncope.  Pt denies neurological change such as new headache, facial or extremity weakness.  Pt denies polydipsia, polyuria, or low sugar symptoms. Pt states overall good compliance with treatment and medications, good tolerability, and has been trying to follow appropriate diet.  Pt denies worsening depressive symptoms, suicidal ideation or panic. No fever, night sweats, wt loss, loss of appetite, or other constitutional symptoms.  Pt states good ability with ADL's, has low fall risk, home safety reviewed and adequate, no other significant changes in hearing or vision, and only occasionally active with exercise.  Having left foot surgury hammertoe on oct 29 after ingrown nails recently done resolved; due for flu shot  Past Medical History:  Diagnosis Date  . Allergic rhinitis, cause unspecified   . Allergy   . Arthritis   . 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 Hyperlipidemia   . Impaired glucose tolerance 04/03/2013  . Personal history of colonic polyps 06/07/2012   Past Surgical History:  Procedure Laterality Date  . BACK SURGERY    . BASAL CELL CARCINOMA EXCISION    . COLONOSCOPY    . HERNIA REPAIR    . herniorrhaphy right    . LUMBAR LAMINECTOMY    . POLYPECTOMY    . SPINE SURGERY     lumbar laminectomy    reports that he has never smoked. He has never used smokeless tobacco. He reports current alcohol use. He reports that he does not use drugs. family history includes COPD in his mother; Cancer in his father; Lung cancer in his father. Allergies  Allergen  Reactions  . Penicillins    Current Outpatient Medications on File Prior to Visit  Medication Sig Dispense Refill  . aspirin 81 MG tablet Take 1 tablet (81 mg total) by mouth daily. 30 tablet 11  . sildenafil (REVATIO) 20 MG tablet TAKE 2 AND ONE-HALF TABLETS (50 MG TOTAL) BY MOUTH AS NEEDED FOR SEXUAL ACTIVITY 25 tablet 0   No current facility-administered medications on file prior to visit.   Review of Systems All otherwise neg per pt     Objective:   Physical Exam BP 132/78 (BP Location: Left Arm, Patient Position: Sitting, Cuff Size: Large)   Pulse 60   Temp 98.1 F (36.7 C) (Oral)   Ht 5\' 11"  (1.803 m)   Wt 202 lb (91.6 kg)   SpO2 96%   BMI 28.17 kg/m  VS noted,  Constitutional: Pt appears in NAD HENT: Head: NCAT.  Right Ear: External ear normal.  Left Ear: External ear normal.  Eyes: . Pupils are equal, round, and reactive to light. Conjunctivae and EOM are normal Nose: without d/c or deformity Neck: Neck supple. Gross normal ROM Cardiovascular: Normal rate and regular rhythm.   Pulmonary/Chest: Effort normal and breath sounds without rales or wheezing.  Abd:  Soft, NT, ND, + BS, no organomegaly Neurological: Pt is alert. At baseline orientation, motor grossly intact Skin: Skin is warm. No rashes, other new lesions, no LE edema Psychiatric: Pt behavior  is normal without agitation  All otherwise neg per pt Lab Results  Component Value Date   WBC 7.8 04/02/2020   HGB 16.0 04/02/2020   HCT 46.3 04/02/2020   PLT 208.0 04/02/2020   GLUCOSE 93 04/02/2020   CHOL 155 04/02/2020   TRIG 224.0 (H) 04/02/2020   HDL 41.90 04/02/2020   LDLDIRECT 88.0 04/02/2020   LDLCALC 110 (H) 06/16/2019   ALT 30 04/02/2020   AST 22 04/02/2020   NA 139 04/02/2020   K 3.8 04/02/2020   CL 103 04/02/2020   CREATININE 1.03 04/02/2020   BUN 17 04/02/2020   CO2 29 04/02/2020   TSH 1.65 04/02/2020   PSA 2.69 04/02/2020   HGBA1C 5.3 04/02/2020      Assessment & Plan:

## 2020-06-20 NOTE — Patient Instructions (Addendum)
You had the flu shot today  Please continue all other medications as before, and refills have been done if requested.  Please have the pharmacy call with any other refills you may need.  Please continue your efforts at being more active, low cholesterol diet, and weight control.  You are otherwise up to date with prevention measures today.  Please keep your appointments with your specialists as you may have planned  Please make an Appointment to return for your 1 year visit, or sooner if needed

## 2020-06-20 NOTE — H&P (View-Only) (Signed)
Subjective:    Patient ID: Charles Lawson, male    DOB: 1941-05-13, 79 y.o.   MRN: [id number]  HPI     Here for yearly f/u;  Overall doing ok;  Pt denies Chest pain, worsening SOB, DOE, wheezing, orthopnea, PND, worsening LE edema, palpitations, dizziness or syncope.  Pt denies neurological change such as new headache, facial or extremity weakness.  Pt denies polydipsia, polyuria, or low sugar symptoms. Pt states overall good compliance with treatment and medications, good tolerability, and has been trying to follow appropriate diet.  Pt denies worsening depressive symptoms, suicidal ideation or panic. No fever, night sweats, wt loss, loss of appetite, or other constitutional symptoms.  Pt states good ability with ADL's, has low fall risk, home safety reviewed and adequate, no other significant changes in hearing or vision, and only occasionally active with exercise.  Having left foot surgury hammertoe on oct 29 after ingrown nails recently done resolved; due for flu shot  Past Medical History:  Diagnosis Date  . Allergic rhinitis, cause unspecified   . Allergy   . Arthritis   . 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 Hyperlipidemia   . Impaired glucose tolerance 04/03/2013  . Personal history of colonic polyps 06/07/2012   Past Surgical History:  Procedure Laterality Date  . BACK SURGERY    . BASAL CELL CARCINOMA EXCISION    . COLONOSCOPY    . HERNIA REPAIR    . herniorrhaphy right    . LUMBAR LAMINECTOMY    . POLYPECTOMY    . SPINE SURGERY     lumbar laminectomy    reports that he has never smoked. He has never used smokeless tobacco. He reports current alcohol use. He reports that he does not use drugs. family history includes COPD in his mother; Cancer in his father; Lung cancer in his father. Allergies  Allergen  Reactions  . Penicillins    Current Outpatient Medications on File Prior to Visit  Medication Sig Dispense Refill  . aspirin 81 MG tablet Take 1 tablet (81 mg total) by mouth daily. 30 tablet 11  . sildenafil (REVATIO) 20 MG tablet TAKE 2 AND ONE-HALF TABLETS (50 MG TOTAL) BY MOUTH AS NEEDED FOR SEXUAL ACTIVITY 25 tablet 0   No current facility-administered medications on file prior to visit.   Review of Systems All otherwise neg per pt     Objective:   Physical Exam BP 132/78 (BP Location: Left Arm, Patient Position: Sitting, Cuff Size: Large)   Pulse 60   Temp 98.1 F (36.7 C) (Oral)   Ht 5\' 11"  (1.803 m)   Wt 202 lb (91.6 kg)   SpO2 96%   BMI 28.17 kg/m  VS noted,  Constitutional: Pt appears in NAD HENT: Head: NCAT.  Right Ear: External ear normal.  Left Ear: External ear normal.  Eyes: . Pupils are equal, round, and reactive to light. Conjunctivae and EOM are normal Nose: without d/c or deformity Neck: Neck supple. Gross normal ROM Cardiovascular: Normal rate and regular rhythm.   Pulmonary/Chest: Effort normal and breath sounds without rales or wheezing.  Abd:  Soft, NT, ND, + BS, no organomegaly Neurological: Pt is alert. At baseline orientation, motor grossly intact Skin: Skin is warm. No rashes, other new lesions, no LE edema Psychiatric: Pt behavior  is normal without agitation  All otherwise neg per pt Lab Results  Component Value Date   WBC 7.8 04/02/2020   HGB 16.0 04/02/2020   HCT 46.3 04/02/2020   PLT 208.0 04/02/2020   GLUCOSE 93 04/02/2020   CHOL 155 04/02/2020   TRIG 224.0 (H) 04/02/2020   HDL 41.90 04/02/2020   LDLDIRECT 88.0 04/02/2020   LDLCALC 110 (H) 06/16/2019   ALT 30 04/02/2020   AST 22 04/02/2020   NA 139 04/02/2020   K 3.8 04/02/2020   CL 103 04/02/2020   CREATININE 1.03 04/02/2020   BUN 17 04/02/2020   CO2 29 04/02/2020   TSH 1.65 04/02/2020   PSA 2.69 04/02/2020   HGBA1C 5.3 04/02/2020      Assessment & Plan:

## 2020-06-23 ENCOUNTER — Encounter: Payer: Self-pay | Admitting: Internal Medicine

## 2020-06-23 NOTE — Assessment & Plan Note (Addendum)
stable overall by history and exam, recent data reviewed with pt, and pt to continue medical treatment as before,  to f/u any worsening symptoms or concerns, cont to monitor at home, and fu next visit  I spent 31 minutes in preparing to see the patient by review of recent labs, imaging and procedures, obtaining and reviewing separately obtained history, communicating with the patient and family or caregiver, ordering medications, tests or procedures, and documenting clinical information in the EHR including the differential Dx, treatment, and any further evaluation and other management of elev BP, hld, hyperglycemia, ingrown nails

## 2020-06-23 NOTE — Assessment & Plan Note (Signed)
stable overall by history and exam, recent data reviewed with pt, and pt to continue medical treatment as before,  to f/u any worsening symptoms or concerns  

## 2020-06-23 NOTE — Assessment & Plan Note (Signed)
Resolved, cont to follow °

## 2020-07-09 ENCOUNTER — Encounter (HOSPITAL_BASED_OUTPATIENT_CLINIC_OR_DEPARTMENT_OTHER): Payer: Self-pay | Admitting: Podiatry

## 2020-07-10 ENCOUNTER — Other Ambulatory Visit: Payer: Self-pay

## 2020-07-10 ENCOUNTER — Encounter (HOSPITAL_BASED_OUTPATIENT_CLINIC_OR_DEPARTMENT_OTHER): Payer: Self-pay | Admitting: Podiatry

## 2020-07-10 NOTE — Progress Notes (Signed)
Spoke w/ via phone for pre-op interview--- Pt Lab needs dos----  no             Lab results------ no COVID test ------ 07-16-2020 2 1005 Arrive at ------- 0700 NPO after MN Medications to take morning of surgery ----- NONE Diabetic medication ----- n/a Patient Special Instructions ----- n/a Pre-Op special Istructions ----- pt's pcp H&P dated 07-03-2020 by dr Cathlean Cower w/ chart Patient verbalized understanding of instructions that were given at this phone interview. Patient denies shortness of breath, chest pain, fever, cough at this phone interview.

## 2020-07-16 ENCOUNTER — Other Ambulatory Visit (HOSPITAL_COMMUNITY)
Admission: RE | Admit: 2020-07-16 | Discharge: 2020-07-16 | Disposition: A | Discharge status: Home / Self Care | Payer: Medicare Other | Source: Ambulatory Visit | Attending: Podiatry | Admitting: Podiatry

## 2020-07-16 DIAGNOSIS — Z01812 Encounter for preprocedural laboratory examination: Secondary | ICD-10-CM | POA: Insufficient documentation

## 2020-07-16 DIAGNOSIS — Z20822 Contact with and (suspected) exposure to covid-19: Secondary | ICD-10-CM | POA: Insufficient documentation

## 2020-07-16 LAB — SARS CORONAVIRUS 2 (TAT 6-24 HRS): SARS Coronavirus 2: NEGATIVE

## 2020-07-19 ENCOUNTER — Ambulatory Visit (HOSPITAL_COMMUNITY): Payer: Medicare Other

## 2020-07-19 ENCOUNTER — Other Ambulatory Visit: Payer: Self-pay

## 2020-07-19 ENCOUNTER — Ambulatory Visit (HOSPITAL_BASED_OUTPATIENT_CLINIC_OR_DEPARTMENT_OTHER): Payer: Medicare Other | Admitting: Anesthesiology

## 2020-07-19 ENCOUNTER — Encounter (HOSPITAL_BASED_OUTPATIENT_CLINIC_OR_DEPARTMENT_OTHER): Payer: Self-pay | Admitting: Podiatry

## 2020-07-19 ENCOUNTER — Encounter (HOSPITAL_BASED_OUTPATIENT_CLINIC_OR_DEPARTMENT_OTHER)
Admission: RE | Disposition: A | Discharge status: Home / Self Care | Payer: Self-pay | Source: Home / Self Care | Attending: Podiatry

## 2020-07-19 ENCOUNTER — Ambulatory Visit (HOSPITAL_BASED_OUTPATIENT_CLINIC_OR_DEPARTMENT_OTHER)
Admission: RE | Admit: 2020-07-19 | Discharge: 2020-07-19 | Disposition: A | Discharge status: Home / Self Care | Payer: Medicare Other | Attending: Podiatry | Admitting: Podiatry

## 2020-07-19 ENCOUNTER — Other Ambulatory Visit: Payer: Self-pay | Admitting: Podiatry

## 2020-07-19 DIAGNOSIS — Z9889 Other specified postprocedural states: Secondary | ICD-10-CM

## 2020-07-19 DIAGNOSIS — S99922A Unspecified injury of left foot, initial encounter: Secondary | ICD-10-CM

## 2020-07-19 DIAGNOSIS — M2012 Hallux valgus (acquired), left foot: Secondary | ICD-10-CM | POA: Diagnosis not present

## 2020-07-19 DIAGNOSIS — M21962 Unspecified acquired deformity of left lower leg: Secondary | ICD-10-CM | POA: Diagnosis not present

## 2020-07-19 DIAGNOSIS — M2042 Other hammer toe(s) (acquired), left foot: Secondary | ICD-10-CM | POA: Diagnosis not present

## 2020-07-19 DIAGNOSIS — S99922S Unspecified injury of left foot, sequela: Secondary | ICD-10-CM | POA: Diagnosis not present

## 2020-07-19 DIAGNOSIS — M7989 Other specified soft tissue disorders: Secondary | ICD-10-CM | POA: Diagnosis not present

## 2020-07-19 DIAGNOSIS — J309 Allergic rhinitis, unspecified: Secondary | ICD-10-CM | POA: Diagnosis not present

## 2020-07-19 DIAGNOSIS — M21622 Bunionette of left foot: Secondary | ICD-10-CM | POA: Diagnosis not present

## 2020-07-19 DIAGNOSIS — E785 Hyperlipidemia, unspecified: Secondary | ICD-10-CM | POA: Diagnosis not present

## 2020-07-19 HISTORY — DX: Male erectile dysfunction, unspecified: N52.9

## 2020-07-19 HISTORY — DX: Personal history of other malignant neoplasm of skin: Z85.828

## 2020-07-19 HISTORY — PX: AIKEN OSTEOTOMY: SHX6331

## 2020-07-19 HISTORY — DX: Elevated blood-pressure reading, without diagnosis of hypertension: R03.0

## 2020-07-19 HISTORY — DX: Other specified postprocedural states: Z98.890

## 2020-07-19 HISTORY — PX: BUNIONECTOMY: SHX129

## 2020-07-19 HISTORY — DX: Diverticulosis of large intestine without perforation or abscess without bleeding: K57.30

## 2020-07-19 HISTORY — DX: Benign prostatic hyperplasia without lower urinary tract symptoms: N40.0

## 2020-07-19 HISTORY — PX: METATARSAL OSTEOTOMY: SHX1641

## 2020-07-19 HISTORY — PX: ORIF TOE FRACTURE: SHX5032

## 2020-07-19 HISTORY — PX: HAMMER TOE SURGERY: SHX385

## 2020-07-19 SURGERY — BUNIONECTOMY, AKIN
Anesthesia: General | Site: Toe | Laterality: Left

## 2020-07-19 MED ORDER — CELECOXIB 200 MG PO CAPS
200.0000 mg | ORAL_CAPSULE | ORAL | Status: AC
  Administered 2020-07-19: 200 mg via ORAL

## 2020-07-19 MED ORDER — VANCOMYCIN HCL IN DEXTROSE 1-5 GM/200ML-% IV SOLN
1000.0000 mg | INTRAVENOUS | Status: AC
  Administered 2020-07-19: 1000 mg via INTRAVENOUS

## 2020-07-19 MED ORDER — CELECOXIB 200 MG PO CAPS
ORAL_CAPSULE | ORAL | Status: AC
  Filled 2020-07-19: qty 1

## 2020-07-19 MED ORDER — OXYCODONE HCL 5 MG PO TABS: 5.0000 mg | ORAL_TABLET | Freq: Once | ORAL | Status: DC | PRN

## 2020-07-19 MED ORDER — GABAPENTIN 300 MG PO CAPS
ORAL_CAPSULE | ORAL | Status: AC
  Filled 2020-07-19: qty 1

## 2020-07-19 MED ORDER — GABAPENTIN 300 MG PO CAPS
300.0000 mg | ORAL_CAPSULE | ORAL | Status: AC
  Administered 2020-07-19: 300 mg via ORAL

## 2020-07-19 MED ORDER — DEXAMETHASONE SODIUM PHOSPHATE 10 MG/ML IJ SOLN
INTRAMUSCULAR | Status: DC | PRN
  Administered 2020-07-19: 10 mg via INTRAVENOUS

## 2020-07-19 MED ORDER — PHENYLEPHRINE 40 MCG/ML (10ML) SYRINGE FOR IV PUSH (FOR BLOOD PRESSURE SUPPORT)
PREFILLED_SYRINGE | INTRAVENOUS | Status: AC
  Filled 2020-07-19: qty 10

## 2020-07-19 MED ORDER — FENTANYL CITRATE (PF) 100 MCG/2ML IJ SOLN
INTRAMUSCULAR | Status: AC
  Filled 2020-07-19: qty 2

## 2020-07-19 MED ORDER — OXYCODONE HCL 5 MG PO TABS
5.0000 mg | ORAL_TABLET | ORAL | 0 refills | Status: AC | PRN
Start: 2020-07-19 — End: 2020-07-26

## 2020-07-19 MED ORDER — EPHEDRINE 5 MG/ML INJ
INTRAVENOUS | Status: AC
  Filled 2020-07-19: qty 10

## 2020-07-19 MED ORDER — BUPIVACAINE HCL (PF) 0.5 % IJ SOLN
INTRAMUSCULAR | Status: DC | PRN
  Administered 2020-07-19: 10 mL

## 2020-07-19 MED ORDER — ONDANSETRON HCL 4 MG/2ML IJ SOLN
INTRAMUSCULAR | Status: AC
  Filled 2020-07-19: qty 2

## 2020-07-19 MED ORDER — PROPOFOL 10 MG/ML IV BOLUS
INTRAVENOUS | Status: DC | PRN
  Administered 2020-07-19: 140 mg via INTRAVENOUS
  Administered 2020-07-19: 20 mg via INTRAVENOUS

## 2020-07-19 MED ORDER — PHENYLEPHRINE 40 MCG/ML (10ML) SYRINGE FOR IV PUSH (FOR BLOOD PRESSURE SUPPORT)
PREFILLED_SYRINGE | INTRAVENOUS | Status: DC | PRN
  Administered 2020-07-19 (×3): 80 ug via INTRAVENOUS

## 2020-07-19 MED ORDER — IBUPROFEN 600 MG PO TABS: 600.0000 mg | ORAL_TABLET | Freq: Three times a day (TID) | ORAL | 0 refills | Status: AC | PRN

## 2020-07-19 MED ORDER — LACTATED RINGERS IV SOLN: INTRAVENOUS | Status: DC

## 2020-07-19 MED ORDER — AMISULPRIDE (ANTIEMETIC) 5 MG/2ML IV SOLN: 10.0000 mg | Freq: Once | INTRAVENOUS | Status: DC | PRN

## 2020-07-19 MED ORDER — ASPIRIN EC 325 MG PO TBEC: 325.0000 mg | DELAYED_RELEASE_TABLET | Freq: Two times a day (BID) | ORAL | 0 refills | Status: AC

## 2020-07-19 MED ORDER — VANCOMYCIN HCL IN DEXTROSE 1-5 GM/200ML-% IV SOLN
INTRAVENOUS | Status: AC
  Filled 2020-07-19: qty 200

## 2020-07-19 MED ORDER — ACETAMINOPHEN 500 MG PO TABS
ORAL_TABLET | ORAL | Status: AC
  Filled 2020-07-19: qty 2

## 2020-07-19 MED ORDER — FENTANYL CITRATE (PF) 100 MCG/2ML IJ SOLN
INTRAMUSCULAR | Status: DC | PRN
  Administered 2020-07-19: 50 ug via INTRAVENOUS
  Administered 2020-07-19 (×2): 25 ug via INTRAVENOUS

## 2020-07-19 MED ORDER — LIDOCAINE 2% (20 MG/ML) 5 ML SYRINGE
INTRAMUSCULAR | Status: AC
  Filled 2020-07-19: qty 5

## 2020-07-19 MED ORDER — OXYCODONE HCL 5 MG/5ML PO SOLN: 5.0000 mg | Freq: Once | ORAL | Status: DC | PRN

## 2020-07-19 MED ORDER — CHLORHEXIDINE GLUCONATE CLOTH 2 % EX PADS: 6.0000 | MEDICATED_PAD | Freq: Once | CUTANEOUS | Status: DC

## 2020-07-19 MED ORDER — FENTANYL CITRATE (PF) 100 MCG/2ML IJ SOLN: 25.0000 ug | INTRAMUSCULAR | Status: DC | PRN

## 2020-07-19 MED ORDER — ONDANSETRON HCL 4 MG/2ML IJ SOLN
INTRAMUSCULAR | Status: DC | PRN
  Administered 2020-07-19 (×2): 4 mg via INTRAVENOUS

## 2020-07-19 MED ORDER — LIDOCAINE HCL 2 % IJ SOLN
INTRAMUSCULAR | Status: DC | PRN
  Administered 2020-07-19: 10 mL

## 2020-07-19 MED ORDER — ACETAMINOPHEN 500 MG PO TABS
1000.0000 mg | ORAL_TABLET | ORAL | Status: AC
  Administered 2020-07-19: 1000 mg via ORAL

## 2020-07-19 MED ORDER — LIDOCAINE 2% (20 MG/ML) 5 ML SYRINGE
INTRAMUSCULAR | Status: DC | PRN
  Administered 2020-07-19: 60 mg via INTRAVENOUS

## 2020-07-19 MED ORDER — ACETAMINOPHEN 500 MG PO TABS: 1000.0000 mg | ORAL_TABLET | Freq: Four times a day (QID) | ORAL | 0 refills | Status: AC | PRN

## 2020-07-19 MED ORDER — PROPOFOL 10 MG/ML IV BOLUS
INTRAVENOUS | Status: AC
  Filled 2020-07-19: qty 20

## 2020-07-19 MED ORDER — ONDANSETRON HCL 4 MG/2ML IJ SOLN: 4.0000 mg | Freq: Once | INTRAMUSCULAR | Status: DC | PRN

## 2020-07-19 MED ORDER — DEXAMETHASONE SODIUM PHOSPHATE 10 MG/ML IJ SOLN
INTRAMUSCULAR | Status: AC
  Filled 2020-07-19: qty 1

## 2020-07-19 MED ORDER — GABAPENTIN 300 MG PO CAPS: 300.0000 mg | ORAL_CAPSULE | Freq: Three times a day (TID) | ORAL | 0 refills | Status: DC

## 2020-07-19 MED ORDER — EPHEDRINE SULFATE-NACL 50-0.9 MG/10ML-% IV SOSY
PREFILLED_SYRINGE | INTRAVENOUS | Status: DC | PRN
  Administered 2020-07-19 (×2): 10 mg via INTRAVENOUS

## 2020-07-19 SURGICAL SUPPLY — 113 items
AO CANNULATED HANDLE ×1 IMPLANT
APL PRP STRL LF DISP 70% ISPRP (MISCELLANEOUS) ×1
BANDAGE ESMARK 6X9 LF (GAUZE/BANDAGES/DRESSINGS) ×1 IMPLANT
BIT DRILL CANNULAT MICA 2.2X60 (BIT) ×1 IMPLANT
BLADE AVERAGE 25X9 (BLADE) ×2 IMPLANT
BLADE MINI RND TIP GREEN BEAV (BLADE) ×2 IMPLANT
BLADE OSC/SAG .038X5.5 CUT EDG (BLADE) ×2 IMPLANT
BLADE SURG 15 STRL LF DISP TIS (BLADE) ×2 IMPLANT
BLADE SURG 15 STRL SS (BLADE) ×8
BLADE SURG SZ10 CARB STEEL (BLADE) ×1 IMPLANT
BNDG CMPR 9X4 STRL LF SNTH (GAUZE/BANDAGES/DRESSINGS)
BNDG CMPR 9X6 STRL LF SNTH (GAUZE/BANDAGES/DRESSINGS) ×1
BNDG COHESIVE 4X5 TAN STRL (GAUZE/BANDAGES/DRESSINGS) ×1 IMPLANT
BNDG CONFORM 2 STRL LF (GAUZE/BANDAGES/DRESSINGS) ×2 IMPLANT
BNDG ELASTIC 3X5.8 VLCR STR LF (GAUZE/BANDAGES/DRESSINGS) ×1 IMPLANT
BNDG ELASTIC 4X5.8 VLCR STR LF (GAUZE/BANDAGES/DRESSINGS) ×1 IMPLANT
BNDG ESMARK 4X9 LF (GAUZE/BANDAGES/DRESSINGS) IMPLANT
BNDG ESMARK 6X9 LF (GAUZE/BANDAGES/DRESSINGS) ×2
BNDG GAUZE ELAST 4 BULKY (GAUZE/BANDAGES/DRESSINGS) ×2 IMPLANT
BUR MICA 2X12 (BURR) IMPLANT
BUR WEDGE MICA 3.1X13 (BURR) IMPLANT
BURR MICA 2X12 (BURR) ×2
BURR WEDGE MICA 3.1X13 (BURR) ×2
CANNULATED DRILL BIT OD:2.2MM L:60MM ×1 IMPLANT
CHLORAPREP W/TINT 26 (MISCELLANEOUS) ×2 IMPLANT
COVER BACK TABLE 60X90IN (DRAPES) ×2 IMPLANT
COVER WAND RF STERILE (DRAPES) ×2 IMPLANT
CUFF TOURN SGL QUICK 18X4 (TOURNIQUET CUFF) IMPLANT
CUFF TOURN SGL QUICK 34 (TOURNIQUET CUFF) ×2
CUFF TRNQT CYL 34X4.125X (TOURNIQUET CUFF) IMPLANT
DECANTER SPIKE VIAL GLASS SM (MISCELLANEOUS) IMPLANT
DRAPE 3/4 80X56 (DRAPES) ×2 IMPLANT
DRAPE C-ARM 35X43 STRL (DRAPES) ×1 IMPLANT
DRAPE EXTREMITY T 121X128X90 (DISPOSABLE) ×2 IMPLANT
DRAPE IMP U-DRAPE 54X76 (DRAPES) ×2 IMPLANT
DRAPE OEC MINIVIEW 54X84 (DRAPES) ×1 IMPLANT
DRAPE U-SHAPE 47X51 STRL (DRAPES) ×2 IMPLANT
DRIVER MICA HEX 2.0 (MISCELLANEOUS) ×1 IMPLANT
DRIVER TIP ×1 IMPLANT
DRSG EMULSION OIL 3X3 NADH (GAUZE/BANDAGES/DRESSINGS) IMPLANT
ELECT REM PT RETURN 9FT ADLT (ELECTROSURGICAL) ×2
ELECTRODE REM PT RTRN 9FT ADLT (ELECTROSURGICAL) ×1 IMPLANT
GAUGE DEPTH MICA PROSTEP 150 (MISCELLANEOUS) ×1 IMPLANT
GAUZE 4X4 16PLY RFD (DISPOSABLE) IMPLANT
GAUZE SPONGE 4X4 12PLY STRL (GAUZE/BANDAGES/DRESSINGS) ×2 IMPLANT
GAUZE SPONGE 4X4 12PLY STRL LF (GAUZE/BANDAGES/DRESSINGS) ×1 IMPLANT
GAUZE XEROFORM 1X8 LF (GAUZE/BANDAGES/DRESSINGS) ×2 IMPLANT
GLOVE BIO SURGEON STRL SZ 6.5 (GLOVE) ×2 IMPLANT
GLOVE BIOGEL M 7.0 STRL (GLOVE) ×2 IMPLANT
GLOVE BIOGEL PI IND STRL 7.0 (GLOVE) IMPLANT
GLOVE BIOGEL PI IND STRL 7.5 (GLOVE) ×1 IMPLANT
GLOVE BIOGEL PI INDICATOR 7.0 (GLOVE) ×2
GLOVE BIOGEL PI INDICATOR 7.5 (GLOVE) ×2
GOWN STRL REUS W/ TWL LRG LVL3 (GOWN DISPOSABLE) ×1 IMPLANT
GOWN STRL REUS W/TWL LRG LVL3 (GOWN DISPOSABLE) ×6
HANDLE AO CANNULATED DRIVER (MISCELLANEOUS) ×1 IMPLANT
IV CATH 18G SAFETY (IV SOLUTION) ×1 IMPLANT
IV NS 1000ML (IV SOLUTION) ×2
IV NS 1000ML BAXH (IV SOLUTION) IMPLANT
K-WIRE BLUNT/TROCAR ×2 IMPLANT
K-WIRE BLUNT/TROCAR 0.9X150 (WIRE) ×4
K-WIRE CAPS STERILE WHITE .045 (WIRE) ×1 IMPLANT
K-WIRE DBL END TROCAR 6X.045 (WIRE)
K-WIRE DBL END TROCAR 6X.062 (WIRE) ×4
K-WIRE SURGICAL 1.6X102 (WIRE) IMPLANT
KWIRE BLUNT/TROCAR 0.9X150 (WIRE) IMPLANT
KWIRE DBL END TROCAR 6X.045 (WIRE) ×1 IMPLANT
KWIRE DBL END TROCAR 6X.062 (WIRE) IMPLANT
MANIFOLD NEPTUNE II (INSTRUMENTS) ×1 IMPLANT
MICA BURR ×1 IMPLANT
NDL HYPO 25X1 1.5 SAFETY (NEEDLE) IMPLANT
NDL SAFETY ECLIPSE 18X1.5 (NEEDLE) IMPLANT
NEEDLE DRIVER ×1 IMPLANT
NEEDLE HYPO 18GX1.5 SHARP (NEEDLE) ×2
NEEDLE HYPO 22GX1.5 SAFETY (NEEDLE) ×1 IMPLANT
NEEDLE HYPO 25X1 1.5 SAFETY (NEEDLE) ×2 IMPLANT
NS IRRIG 1000ML POUR BTL (IV SOLUTION) ×1 IMPLANT
NS IRRIG 500ML POUR BTL (IV SOLUTION) ×1 IMPLANT
PACK BASIN DAY SURGERY FS (CUSTOM PROCEDURE TRAY) ×2 IMPLANT
PACK INSTRUMENT PROSTEP DISP (INSTRUMENTS) ×1 IMPLANT
PAD CAST 4YDX4 CTTN HI CHSV (CAST SUPPLIES) IMPLANT
PADDING CAST ABS 4INX4YD NS (CAST SUPPLIES)
PADDING CAST ABS COTTON 4X4 ST (CAST SUPPLIES) ×1 IMPLANT
PADDING CAST COTTON 4X4 STRL (CAST SUPPLIES) ×2
PENCIL SMOKE EVACUATOR (MISCELLANEOUS) ×2 IMPLANT
PIN CAPS ORTHO GREEN .062 (PIN) ×1 IMPLANT
PLATE PLANTAR REPAIR GRAVITY (Plate) ×1 IMPLANT
PROSTEP DEPTH GAUGE ×1 IMPLANT
PROSTEP DRILL SLEEVE 2.2 ×1 IMPLANT
PROSTEP MIS STERILE INSTRUMENT PACK W/BLADE ×1 IMPLANT
SCREW MICA PROSTEP 3X30 (Screw) ×1 IMPLANT
SLEEVE DRILL MICA PROSTEP 2.2 (MISCELLANEOUS) ×1 IMPLANT
SPONGE LAP 4X18 RFD (DISPOSABLE) ×1 IMPLANT
STOCKINETTE 6  STRL (DRAPES) ×2
STOCKINETTE 6 STRL (DRAPES) ×1 IMPLANT
SUCTION FRAZIER HANDLE 10FR (MISCELLANEOUS) ×2
SUCTION TUBE FRAZIER 10FR DISP (MISCELLANEOUS) ×1 IMPLANT
SURGICAL IRRIGATION TUBE ×1 IMPLANT
SUT ETHILON 4 0 PS 2 18 (SUTURE) ×2 IMPLANT
SUT MNCRL AB 3-0 PS2 18 (SUTURE) ×3 IMPLANT
SUT MNCRL AB 4-0 PS2 18 (SUTURE) ×3 IMPLANT
SUT MON AB 5-0 PS2 18 (SUTURE) ×3 IMPLANT
SUT VIC AB 2-0 SH 27 (SUTURE) ×4
SUT VIC AB 2-0 SH 27XBRD (SUTURE) ×1 IMPLANT
SYR 20ML LL LF (SYRINGE) ×2 IMPLANT
SYR BULB EAR ULCER 3OZ GRN STR (SYRINGE) ×2 IMPLANT
SYR CONTROL 10ML LL (SYRINGE) ×1 IMPLANT
TOWEL OR 17X26 10 PK STRL BLUE (TOWEL DISPOSABLE) ×2 IMPLANT
TRAY DSU PREP LF (CUSTOM PROCEDURE TRAY) IMPLANT
TUBE CONNECTING 12X1/4 (SUCTIONS) ×2 IMPLANT
TUBE SURG IRRIGATION (TUBING) ×1 IMPLANT
UNDERPAD 30X36 HEAVY ABSORB (UNDERPADS AND DIAPERS) ×2 IMPLANT
WEDGE BURR 3.1 X 13MM ×1 IMPLANT

## 2020-07-19 NOTE — Brief Op Note (Signed)
07/19/2020  1:00 PM  PATIENT:  Geanie Kenning  79 y.o. male  PRE-OPERATIVE DIAGNOSIS:  BUNION, TAILOR'S BUNION, HAMMERTOES LEFT FOOT  POST-OPERATIVE DIAGNOSIS:  BUNION, TAILOR'S BUNION, HAMMERTOES LEFT FOOT  PROCEDURE:  Procedure(s) with comments: AIKEN OSTEOTOMY (Left) METATARSAL OSTEOTOMY (Left) TAILORS BUNIONECTOMY (Left) HAMMER TOE CORRECTION (Left) OPEN TREATMENT OF METARSAL PHALANGEAL JOINT DISLOCATION (Left) - ANKLE BLOCK WITH THIGH TOURNIQUET  SURGEON:  Surgeon(s) and Role:    * Roslynn Holte R, DPM - Primary    ASSISTANTS: none   ANESTHESIA:   general  EBL:  5 mL   BLOOD ADMINISTERED:none  DRAINS: none   LOCAL MEDICATIONS USED:  Marcaine 0.5 % and lidocaine 2% in 50/50 mix 108mL  SPECIMEN:  No Specimen  DISPOSITION OF SPECIMEN:  N/A  COUNTS:  YES  TOURNIQUET:   Total Tourniquet Time Documented: Thigh (Left) - 120 minutes Total: Thigh (Left) - 120 minutes   DICTATION: .Note written in EPIC  PLAN OF CARE: Discharge to home after PACU  PATIENT DISPOSITION:  PACU - hemodynamically stable.   Delay start of Pharmacological VTE agent (>24hrs) due to surgical blood loss or risk of bleeding: no  WBAT in CAM boot LLE Ice behind knee

## 2020-07-19 NOTE — Anesthesia Preprocedure Evaluation (Signed)
Anesthesia Evaluation  Patient identified by MRN, date of birth, ID band Patient awake    Reviewed: Allergy & Precautions, NPO status , Patient's Chart, lab work & pertinent test results  History of Anesthesia Complications Negative for: history of anesthetic complications  Airway Mallampati: II  TM Distance: >3 FB Neck ROM: Full    Dental  (+) Teeth Intact   Pulmonary neg pulmonary ROS,    Pulmonary exam normal        Cardiovascular Normal cardiovascular exam  HLD   Neuro/Psych negative neurological ROS  negative psych ROS   GI/Hepatic negative GI ROS, Neg liver ROS,   Endo/Other  negative endocrine ROS  Renal/GU negative Renal ROS  negative genitourinary   Musculoskeletal  (+) Arthritis ,   Abdominal   Peds  Hematology negative hematology ROS (+)   Anesthesia Other Findings   Reproductive/Obstetrics                            Anesthesia Physical Anesthesia Plan  ASA: II  Anesthesia Plan: General   Post-op Pain Management:    Induction: Intravenous  PONV Risk Score and Plan: 2 and Treatment may vary due to age or medical condition, Ondansetron and Dexamethasone  Airway Management Planned: LMA  Additional Equipment: None  Intra-op Plan:   Post-operative Plan: Extubation in OR  Informed Consent: I have reviewed the patients History and Physical, chart, labs and discussed the procedure including the risks, benefits and alternatives for the proposed anesthesia with the patient or authorized representative who has indicated his/her understanding and acceptance.     Dental advisory given  Plan Discussed with:   Anesthesia Plan Comments:        Anesthesia Quick Evaluation

## 2020-07-19 NOTE — Transfer of Care (Signed)
Immediate Anesthesia Transfer of Care Note  Patient: Charles Lawson  Procedure(s) Performed: Procedure(s) (LRB): Barbie Banner OSTEOTOMY (Left) METATARSAL OSTEOTOMY (Left) TAILORS BUNIONECTOMY (Left) HAMMER TOE CORRECTION (Left) OPEN TREATMENT OF METARSAL PHALANGEAL JOINT DISLOCATION (Left)  Patient Location: PACU  Anesthesia Type: General  Level of Consciousness: awake, alert  and oriented  Airway & Oxygen Therapy: Patient Spontanous Breathing and Patient connected to nasal cannula oxygen  Post-op Assessment: Report given to PACU RN and Post -op Vital signs reviewed and stable  Post vital signs: Reviewed and stable  Complications: No apparent anesthesia complications Last Vitals:  Vitals Value Taken Time  BP 122/74 07/19/20 1310  Temp    Pulse 95 07/19/20 1314  Resp 14 07/19/20 1314  SpO2 96 % 07/19/20 1314  Vitals shown include unvalidated device data.  Last Pain:  Vitals:   07/19/20 0739  TempSrc:   PainSc: 0-No pain      Patients Stated Pain Goal: 5 (03/54/65 6812)  Complications: No complications documented.

## 2020-07-19 NOTE — Discharge Instructions (Signed)
Post-Surgery Instructions  1. If you are recuperating from surgery anywhere other than home, please be sure to leave [place] a number where you can be reached. 2. Go directly home and rest. 3. The keep operated foot (or feet) elevated six inches above the hip when sitting or lying down. 4. Support the elevated foot and leg with pillows under the calf. DO NOT PLACE PILLOWS UNDER THE KNEE. 5. DO NOT REMOVE or get your bandages wet. This will increase your chances of getting an infection. 6. Wear your surgical shoe at all times when you are up. 7. A limited amount of pain and swelling may occur. The skin may take on a bruised appearance. This is no cause for alarm. 8. For slight pain and swelling, apply an ice pack behind the calf and knee for 15 minutes every hour. Continue icing until seen in the office. DO NOT apply any form of heat to the area. 9. Have prescription(s) filled immediately and take as directed. 10. Drink lots of liquids, water, and juice. 11. CALL THE OFFICE IMMEDIATELY IF: a. Bleeding continues b. Pain increases and/or does not respond to medication c. Bandage or cast appears too tight d. Any liquids (water, coffee, etc.) have spilled on your bandages. e. Tripping, falling, or stubbing the surgical foot f. If your temperature rises above 101 g. If you have ANY questions at all 12. Please use the crutches, knee scooter, or walker you have prescribed, rented, or purchased. If you are weight-bearing, follow your physician's instructions. You are expected to be: ?X weight-bearing ? non-weight bearing 13. Special Instructions: Take the medications as directed. Take the tylenol 1,000mg  every 6 hours, ibuprofen 600mg  every 8 hours, gabapentin every 8 hours. Take oxycodone 5mg  only as needed for breakthrough pain not controlled by the other medications. Take a 325mg  aspirin twice daily. This is to reduce your risk of developing a blood clot in the leg after surgery.  14. Your next  appointment is: 07/25/20 at 2:15PM  If you need to reach the nurse for any reason, please call: [place]: [phone] : [phone] [place]: [phone]  Regional Anesthesia Blocks  1. Numbness or the inability to move the "blocked" extremity may last from 3-48 hours after placement. The length of time depends on the medication injected and your individual response to the medication. If the numbness is not going away after 48 hours, call your surgeon.  2. The extremity that is blocked will need to be protected until the numbness is gone and the  Strength has returned. Because you cannot feel it, you will need to take extra care to avoid injury. Because it may be weak, you may have difficulty moving it or using it. You may not know what position it is in without looking at it while the block is in effect.  3. For blocks in the legs and feet, returning to weight bearing and walking needs to be done carefully. You will need to wait until the numbness is entirely gone and the strength has returned. You should be able to move your leg and foot normally before you try and bear weight or walk. You will need someone to be with you when you first try to ensure you do not fall and possibly risk injury.  4. Bruising and tenderness at the needle site are common side effects and will resolve in a few days.  5. Persistent numbness or new problems with movement should be communicated to the surgeon or the [facility]  [place] [phone] [place] [phone]). Post Anesthesia Home Care Instructions  Activity: Get plenty of rest for the remainder of the day. A responsible individual must stay with you for 24 hours following the procedure.  For the next 24 hours, DO NOT: -Drive a car -Paediatric nurse -Drink alcoholic beverages -Take any medication unless instructed by your physician -Make any legal decisions or sign important  papers.  Meals: Start with liquid foods such as gelatin or soup. Progress to regular foods as tolerated. Avoid greasy, spicy, heavy foods. If nausea and/or vomiting occur, drink only clear liquids until the nausea and/or vomiting subsides. Call your physician if vomiting continues.  Special Instructions/Symptoms: Your throat may feel dry or sore from the anesthesia or the breathing tube placed in your throat during surgery. If this causes discomfort, gargle with warm salt water. The discomfort should disappear within 24 hours.

## 2020-07-19 NOTE — Interval H&P Note (Signed)
History and Physical Interval Note:  07/19/2020 8:59 AM  Charles Lawson  has presented today for surgery, with the diagnosis of BUNION, HAMMERTOES, TAILOR'S BUNION LEFT FOOT.  The various methods of treatment have been discussed with the patient and family. After consideration of risks, benefits and other options for treatment, the patient has consented to  Procedure(s) with comments: AIKEN OSTEOTOMY (Left) METATARSAL OSTEOTOMY (Left) TAILORS BUNIONECTOMY (Left) HAMMER TOE CORRECTION (Left) OPEN TREATMENT OF METARSAL PHALANGEAL JOINT DISLOCATION (Left) - MAC W/BLOCK AND THIGH TOURNIQUET as a surgical intervention.  The patient's history has been reviewed, patient examined, no change in status, stable for surgery.  I have reviewed the patient's chart and labs.  Questions were answered to the patient's satisfaction.     Criselda Peaches

## 2020-07-19 NOTE — Anesthesia Postprocedure Evaluation (Signed)
Anesthesia Post Note  Patient: Charles Lawson  Procedure(s) Performed: Barbie Banner OSTEOTOMY (Left Toe) METATARSAL OSTEOTOMY (Left Toe) TAILORS BUNIONECTOMY (Left Toe) HAMMER TOE CORRECTION (Left Toe) OPEN TREATMENT OF METARSAL PHALANGEAL JOINT DISLOCATION (Left Toe)     Patient location during evaluation: PACU Anesthesia Type: General Level of consciousness: awake and alert Pain management: pain level controlled Vital Signs Assessment: post-procedure vital signs reviewed and stable Respiratory status: spontaneous breathing, nonlabored ventilation and respiratory function stable Cardiovascular status: blood pressure returned to baseline and stable Postop Assessment: no apparent nausea or vomiting Anesthetic complications: no   No complications documented.  Last Vitals:  Vitals:   07/19/20 1330 07/19/20 1345  BP: 117/72 114/73  Pulse: 92 88  Resp: 16 13  Temp:  36.8 C  SpO2: 94% 94%    Last Pain:  Vitals:   07/19/20 1345  TempSrc:   PainSc: 0-No pain                 Lidia Collum

## 2020-07-19 NOTE — Anesthesia Procedure Notes (Signed)
Procedure Name: LMA Insertion Date/Time: 07/19/2020 9:23 AM Performed by: Mechele Claude, CRNA Pre-anesthesia Checklist: Patient identified, Emergency Drugs available, Suction available and Patient being monitored Patient Re-evaluated:Patient Re-evaluated prior to induction Oxygen Delivery Method: Circle system utilized Preoxygenation: Pre-oxygenation with 100% oxygen Induction Type: IV induction Ventilation: Mask ventilation without difficulty LMA: LMA inserted LMA Size: 4.0 Number of attempts: 1 Airway Equipment and Method: Bite block Placement Confirmation: positive ETCO2 Tube secured with: Tape Dental Injury: Teeth and Oropharynx as per pre-operative assessment

## 2020-07-19 NOTE — Progress Notes (Signed)
Called Dr. Sherryle Lis to verify correct side for procedure

## 2020-07-20 DIAGNOSIS — S99922A Unspecified injury of left foot, initial encounter: Secondary | ICD-10-CM

## 2020-07-20 DIAGNOSIS — M21962 Unspecified acquired deformity of left lower leg: Secondary | ICD-10-CM

## 2020-07-20 DIAGNOSIS — M2012 Hallux valgus (acquired), left foot: Secondary | ICD-10-CM

## 2020-07-20 DIAGNOSIS — M2042 Other hammer toe(s) (acquired), left foot: Secondary | ICD-10-CM

## 2020-07-20 DIAGNOSIS — M21622 Bunionette of left foot: Secondary | ICD-10-CM

## 2020-07-20 NOTE — Op Note (Addendum)
Patient Name: Charles Lawson DOB: 1941-04-12  MRN: [id number]   Date of Service: 07/19/2020  Surgeon: Dr. Lanae Crumbly, DPM Assistants: None Pre-operative Diagnosis:  1. Hammertoe of left foot   2. Hallux interphalangeus, acquired, left   3. Metatarsophalangeal joint dislocation 2nd, left  4. Tailor's bunion of left foot     Post-operative Diagnosis:  1. Hammertoe of left foot   2. Hallux interphalangeus, acquired, left   3. Metatarsophalangeal joint dislocation 2nd, left  4. Tailor's bunion of left foot   Procedures: #1 left foot Akin osteotomy #2 digital PIPJ arthrodeses 2, 3 left foot #3 open treatment 2nd metatarsophalangeal joint dislocation left foot #4 Weil osteotomy 2nd metatarsal left foot #5 exostectomy and condylectomy left fifth metatarsal head #6 capsulotomy 3rd metatarsophalangeal joint  Pathology/Specimens: * No specimens in log * Anesthesia: general anesthesia  Hemostasis:  Total Tourniquet Time Documented: Thigh (Left) - 120 minutes Total: Thigh (Left) - 120 minutes  Estimated Blood Loss: 5 mL Materials:  Implant Name Type Inv. Item Serial No. Manufacturer Lot No. LRB No. Used Action  PROSTEP SCREW 3MM X 30MM Screw   [employer] [id number] Left 1 Implanted  WRIGHT Gravity Plantar Plate Repair Implant Set Plate   [facility] [employer] [id number] Left 1 Implanted, then explanted  Rayne off Screw 12 mm in length  screw    [facility].   left  2  implanted   Medications: ankle block administered with 20 cc of 0.5% Marcaine plain and 2% Xylocaine plain and 93-71 mix Complications: None  Indications for Procedure:  This is a 79 y.o. male with a history of painful hammertoes of the second and third toes that are abutting the hallux with hallux interphalangeus deformity, as well as a painful exostosis on the lateral and plantar fifth metatarsal head with a hyperkeratosis that was painful for him on the plantar fifth  metatarsophalangeal joint.  After failing conservative and nonsurgical therapy with padding and shoe gear changes, he elected for operative intervention.  Informed consent was reviewed and signed in the office and again on the day of surgery.   Procedure in Detail: Patient was identified in pre-operative holding area. Formal consent was signed and the left lower extremity was marked. Patient was brought back to the operating room. Anesthesia was induced. The extremity was prepped and draped in the usual sterile fashion. Timeout was taken to confirm patient name, laterality, and procedure prior to incision.   Attention was then directed to the lateral left foot where the exostosis on the lateral and plantar fifth metatarsal head was identified under fluoroscopy.  A small incision was made just proximal to this, a hemostat was used to dissect the soft tissues bluntly to avoid damage to neurovascular structures, and a curved elevator was passed deep through subcutaneous tissues and advanced to the level of the exostosis under fluoroscopy.  A [employer] ProStep Shannon bur was then advanced under fluoroscopy to the level of the exostosis in the exostosis was resected.  The elevator was then again used to advance the soft tissue tunnel to the plantar condyles of the fifth metatarsal head.  The bur was then advanced to this position and the condyles were resected as well.  Its adequate reduction was achieved the resulting bone paste was then expressed through the incision, and the soft tissue rasp was used to evacuate all bony debris.  This was then thoroughly irrigated with 100 cc of normal sterile saline and 20 mL increments  using a Angiocath.  The incision was then closed with 3-0 nylon.  I then directed my attention to the left hallux to address the hallux interphalangeus deformity.  A incision was made on the distal medial proximal phalanx and a hemostat was used to bluntly dissect the soft tissues to the  level of the bone.  An elevator was used to create a soft tissue window to this point.  A [employer] ProStep cutting bur was then advanced into the bone from distal medial to proximal lateral and oblique Akin osteotomy was created using the bur.  This is then thoroughly irrigated to bony debris using an angiocatheter in normal sterile saline.  Position and correction of the deformity was confirmed with fluoroscopy and the osteotomy was easily reducible with manual compression.  A guidewire for a 3 mm cannulated screw was then placed from the proximal medial condyle to advance to the distal lateral portion of the proximal phalanx perpendicular to the osteotomy.  Position was confirmed under fluoroscopy.  A second guidewire was used to hold the osteotomy in position while the screw was advanced.  Good compression of the osteotomy was noted with advancement of the screw in position was checked in all planes under fluoroscopy.  Both incisions were then thoroughly irrigated and closed with 3-0 nylon.  Attention was then directed to the second and third digital contractures.  An incision was made on the second toe from the middle phalanx to the level of the second metatarsal neck, curving the incision along the medial side of the second metatarsal as I approached proximally.  Blunt dissection was then used to expose the second metatarsal phalangeal joint, ensuring that all vital neurovascular structures were retracted safely from harm's way.  The dorsal capsule was incised and the joint was exposed.  A periosteal incision was made laterally along the dorsal second metatarsal and the periosteum and capsule was reflected.  A significant amount of synovitis was noted on the dorsal metatarsal phalangeal joint.  Mild osteoarthritis was present as well.  The second toe was then plantarflexed, and a Weil metatarsal osteotomy was then created using a sagittal saw.  The metatarsal head was then positioned proximally into  position and retraction was confirmed on fluoroscopy.  This was temporary fixated with a 0.62 inch Kirschner wire a second 0.062 inch Kirschner wire was then placed into the base of proximal phalanx and the distractor was applied and the joint was distracted.  The collateral ligaments and plantar plate were severely attenuated with a full-thickness tear of the plantar plate noted and atrophy of the tissues was present.  I then attempted to repair the plantar plate with the [place], and attempted to place the #0 Force Fiber suture with the supplied corkscrew needle, I was able to place the for suture in the medial portion of the plantar plate, however the lateral portions were severely thinned and attenuated and good purchase of the suture was not attainable with the suture despite multiple attempts.  I then attempted to secure the repair and correct the deformity using the single medially placed Force Fiber suture, when the bone tunnels were created in the proximal phalanx and the suture was tied, significant under correction of the medial deviation of the digit was noted, and I made an intraoperative decision that the Gravity system should be abandoned.  The #0 Force Fiber suture was removed.  I then directly repaired the collateral ligaments and the plantar plate using 3-0 Monocryl I determined  that we would pin the metatarsophalangeal joint with a 0.062 inch Kirschner wire in the plantar flex and laterally deviated position to maintain its correct position.  The Kirschner wires for the distractor were then removed and the distractor was removed.  The Weil osteotomy was secured with 2 [facility] snap off screws, 12 mm in length.  I then continued my dissection along the proximal phalanx to the level of the proximal interphalangeal joint.  A Z-lengthening style tenotomy was made in the extensor digitorum longus tendon, the proximal interphalangeal joint was exposed and the  collateral ligaments were released.  The head of the proximal phalanx and the base of middle phalanx was then resected using a sagittal saw.  I advanced a 0.062 inch Kirschner wire through the base of middle phalanx through the tip of the toe and then retrograde into the proximal phalanx.  I position the second digit in a plantarflexed and laterally deviated position to correct the deformity and advanced the Kirschner wire into the head of the metatarsal and through the metatarsal neck, taking care to avoid the [facility] snap off screws that had been placed.  The incision was then thoroughly irrigated with normal sterile saline the extensor tenotomy was then repaired using 3-0 Monocryl in an appropriately lengthened position.  The capsule and periosteum of the metatarsal was then repaired with 3-0 Monocryl, maintaining the dorsal capsulotomy.  Subcutaneous tissues were closed with 4-0 Monocryl, and skin was closed with 5-0 Monocryl.  I then directed my attention to the third digit where a linear incision was made from the base of the proximal phalanx to the distal middle phalanx.  This was carried deep to expose the extensor digitorum longus tendon and a Z-lengthening tenotomy was made.  The PIPJ was exposed and the collateral ligaments were released.  The articular surface of the head of the proximal phalanx and the base of the middle phalanx was then resected using a sagittal saw.  I continued dissection proximally to expose the metatarsal phalangeal joint and a dorsal capsulotomy was made.  A 0.062 inch Kirschner wire was then inserted through the base of the middle phalanx through the distal phalanx and out the tip of the toe just under the nail.  This was then driven retrograde into the metatarsal head and neck, while holding the digit in a plantarflexed and laterally deviated position to correct the deformity.  Good compression of the arthrodesis site was noted clinically and fluoroscopically  with adequate reduction of the deformity.  The incision was then thoroughly irrigated, and closure was initiated, using 3-0 Monocryl to repair the extensor tenotomy and appropriately lengthened position, subcutaneous tissues were closed with 4-0 Monocryl, and skin was closed with 5-0 Monocryl.   The foot was then dressed with Xeroform, dry sterile gauze dressings, and Coban.  He was placed into a CAM boot postoperatively.  The patient tolerated the procedure well, with all vital signs stable.  At the end of the operative procedure all counts were correct.  No complications were noted.  The tourniquet was deflated after a total of 120 minutes with immediate return of capillary refill and peripheral pulses and was not reinflated.   Disposition: Following a period of post-operative monitoring, patient will be transferred to the postanesthesia care unit for further monitoring.

## 2020-07-22 ENCOUNTER — Encounter (HOSPITAL_BASED_OUTPATIENT_CLINIC_OR_DEPARTMENT_OTHER): Payer: Self-pay | Admitting: Podiatry

## 2020-07-25 ENCOUNTER — Ambulatory Visit (INDEPENDENT_AMBULATORY_CARE_PROVIDER_SITE_OTHER): Payer: Medicare Other | Admitting: Podiatry

## 2020-07-25 ENCOUNTER — Ambulatory Visit (INDEPENDENT_AMBULATORY_CARE_PROVIDER_SITE_OTHER): Payer: Medicare Other

## 2020-07-25 ENCOUNTER — Other Ambulatory Visit: Payer: Self-pay

## 2020-07-25 DIAGNOSIS — M2042 Other hammer toe(s) (acquired), left foot: Secondary | ICD-10-CM

## 2020-07-25 DIAGNOSIS — M2012 Hallux valgus (acquired), left foot: Secondary | ICD-10-CM

## 2020-07-25 DIAGNOSIS — M79672 Pain in left foot: Secondary | ICD-10-CM

## 2020-07-25 DIAGNOSIS — M79671 Pain in right foot: Secondary | ICD-10-CM

## 2020-07-25 DIAGNOSIS — M21962 Unspecified acquired deformity of left lower leg: Secondary | ICD-10-CM

## 2020-07-25 DIAGNOSIS — M21622 Bunionette of left foot: Secondary | ICD-10-CM

## 2020-07-26 ENCOUNTER — Encounter: Payer: Self-pay | Admitting: Podiatry

## 2020-07-26 NOTE — Progress Notes (Signed)
  Subjective:  Patient ID: Charles Lawson, male    DOB: 02-05-1941,  MRN: [id number]  Chief Complaint  Patient presents with  . Routine Post Op    PT stated that his only concern is the 3rd toe still bending. PT denies any pain at this time. Denies any fever chills vomitting and or nausea     DOS: 07/19/20 Procedure: left foot 5th metatarsal ostectomy, correction hammertoe and angular deformities of 2,3, metatarsal osteotomy 2nd, 1st phalangeal Akin osteotomy   79 y.o. male returns for post-op check. Doing well, pain has improved significantly. Has been minimal WB in boot  Review of Systems: Negative except as noted in the HPI. Denies N/V/F/Ch.   Objective:  There were no vitals filed for this visit. There is no height or weight on file to calculate BMI. Constitutional Well developed. Well nourished.  Vascular Foot warm and well perfused. Capillary refill normal to all digits.   Neurologic Normal speech. Oriented to person, place, and time. Epicritic sensation to light touch grossly present bilaterally.  Dermatologic Skin healing well without signs of infection. Skin edges well coapted without signs of infection.  Orthopedic: Tenderness to palpation noted about the surgical site. Kirschner wires intact and in position   Radiographs: consistent with post operative changes, all hardware intact and in position Assessment:   1. Hammertoe of left foot    Plan:  Patient was evaluated and treated and all questions answered.  S/p foot surgery left -Progressing as expected post-operatively. -XR: c/w post op changes -WB Status: WBAT in CAM boot -Sutures: left intact, plan to remove next week. -Medications: no refills required -Foot redressed with sterile dressings and application of povidone-iodine  Return in about 1 week (around 08/01/2020) for suture removal.

## 2020-07-29 ENCOUNTER — Encounter: Payer: Self-pay | Admitting: Internal Medicine

## 2020-07-29 ENCOUNTER — Telehealth (INDEPENDENT_AMBULATORY_CARE_PROVIDER_SITE_OTHER): Payer: Medicare Other | Admitting: Internal Medicine

## 2020-07-29 DIAGNOSIS — J329 Chronic sinusitis, unspecified: Secondary | ICD-10-CM | POA: Diagnosis not present

## 2020-07-29 MED ORDER — DOXYCYCLINE HYCLATE 100 MG PO TABS
100.0000 mg | ORAL_TABLET | Freq: Two times a day (BID) | ORAL | 0 refills | Status: DC
Start: 2020-07-29 — End: 2021-06-23

## 2020-07-29 NOTE — Assessment & Plan Note (Signed)
Acute Likely bacterial  Start doxycycline 100 mg BID x 10 days otc cold medications Rest, fluid Call if no improvement

## 2020-07-29 NOTE — Progress Notes (Signed)
Virtual Visit via telephone Note  I connected with Charles Lawson on 07/29/20 at 10:30 AM EST by telephone and verified that I am speaking with the correct person using two identifiers.   I discussed the limitations of evaluation and management by telemedicine and the availability of in person appointments. The patient expressed understanding and agreed to proceed.  Present for the visit:  Myself, Dr Billey Gosling, [place].  The patient is currently at home and I am in the office.    No referring provider.    History of Present Illness: He is here for an acute visit for cold symptoms.  His symptoms started 3 days ago.  He is experiencing fatigue, congestion, sinus pain, sore throat, cough, SOB with cough and headaches. He feels like he has mucus in his chest but can not get it up.    He has tried taking cold and flu, cough drops   Review of Systems  Constitutional: Positive for malaise/fatigue. Negative for chills and fever.  HENT: Positive for congestion, sinus pain and sore throat. Negative for ear pain.   Respiratory: Positive for cough and shortness of breath. Negative for sputum production and wheezing.   Gastrointestinal: Negative for abdominal pain, diarrhea and nausea.  Neurological: Positive for headaches (sinus). Negative for dizziness.      Social History   Socioeconomic History  . Marital status: Married    Spouse name: Not on file  . Number of children: 1  . Years of education: Not on file  . Highest education level: Not on file  Occupational History  . Not on file  Tobacco Use  . Smoking status: Never Smoker  . Smokeless tobacco: Never Used  Vaping Use  . Vaping Use: Never used  Substance and Sexual Activity  . Alcohol use: Yes    Comment: occaional  . Drug use: Never  . Sexual activity: Yes  Other Topics Concern  . Not on file  Social History Narrative  . Not on file   Social Determinants of Health   Financial Resource Strain:   . Difficulty of  Paying Living Expenses: Not on file  Food Insecurity:   . Worried About Charity fundraiser in the Last Year: Not on file  . Ran Out of Food in the Last Year: Not on file  Transportation Needs:   . Lack of Transportation (Medical): Not on file  . Lack of Transportation (Non-Medical): Not on file  Physical Activity:   . Days of Exercise per Week: Not on file  . Minutes of Exercise per Session: Not on file  Stress:   . Feeling of Stress : Not on file  Social Connections:   . Frequency of Communication with Friends and Family: Not on file  . Frequency of Social Gatherings with Friends and Family: Not on file  . Attends Religious Services: Not on file  . Active Member of Clubs or Organizations: Not on file  . Attends Archivist Meetings: Not on file  . Marital Status: Not on file     Observations/Objective:    Assessment and Plan:  See Problem List for Assessment and Plan of chronic medical problems.   Follow Up Instructions:    I discussed the assessment and treatment plan with the patient. The patient was provided an opportunity to ask questions and all were answered. The patient agreed with the plan and demonstrated an understanding of the instructions.   The patient was advised to call back or seek an  in-person evaluation if the symptoms worsen or if the condition fails to improve as anticipated.  Time spent on telephone call - 8 minutes  Binnie Rail, MD

## 2020-08-01 ENCOUNTER — Ambulatory Visit (INDEPENDENT_AMBULATORY_CARE_PROVIDER_SITE_OTHER): Payer: Medicare Other | Admitting: Podiatry

## 2020-08-01 ENCOUNTER — Other Ambulatory Visit: Payer: Self-pay

## 2020-08-01 DIAGNOSIS — M2012 Hallux valgus (acquired), left foot: Secondary | ICD-10-CM

## 2020-08-01 DIAGNOSIS — M2042 Other hammer toe(s) (acquired), left foot: Secondary | ICD-10-CM

## 2020-08-01 DIAGNOSIS — M21622 Bunionette of left foot: Secondary | ICD-10-CM

## 2020-08-04 ENCOUNTER — Encounter: Payer: Self-pay | Admitting: Podiatry

## 2020-08-04 NOTE — Progress Notes (Signed)
  Subjective:  Patient ID: Charles Lawson, male    DOB: 05-09-1941,  MRN: [id number]  Chief Complaint  Patient presents with  . Routine Post Op    Pt stated that he has no concerns and denies any pain at this time     DOS: 07/19/20 Procedure: left foot 5th metatarsal ostectomy, correction hammertoe and angular deformities of 2,3, metatarsal osteotomy 2nd, 1st phalangeal Akin osteotomy   79 y.o. male returns for post-op check. Doing well, pain has improved significantly. Has been minimal WB in boot  Review of Systems: Negative except as noted in the HPI. Denies N/V/F/Ch.   Objective:  There were no vitals filed for this visit. There is no height or weight on file to calculate BMI. Constitutional Well developed. Well nourished.  Vascular Foot warm and well perfused. Capillary refill normal to all digits.   Neurologic Normal speech. Oriented to person, place, and time. Epicritic sensation to light touch grossly present bilaterally.  Dermatologic Skin healing well without signs of infection. Skin edges well coapted without signs of infection.  Orthopedic: Tenderness to palpation noted about the surgical site. Kirschner wires intact and in position    Assessment:   1. Hammertoe of left foot   2. Hallux interphalangeus, acquired, left   3. Tailor's bunion of left foot    Plan:  Patient was evaluated and treated and all questions answered.  S/p foot surgery left -Progressing as expected post-operatively. -new x-rays at next visit prior to pin removal -WB Status: WBAT in CAM boot -Sutures: removed today -Can begin gentle bathing. Apply neosporin to pin sites after  No follow-ups on file.

## 2020-08-07 DIAGNOSIS — Z20822 Contact with and (suspected) exposure to covid-19: Secondary | ICD-10-CM | POA: Diagnosis not present

## 2020-08-22 ENCOUNTER — Ambulatory Visit (INDEPENDENT_AMBULATORY_CARE_PROVIDER_SITE_OTHER): Payer: Medicare Other | Admitting: Podiatry

## 2020-08-22 ENCOUNTER — Other Ambulatory Visit: Payer: Self-pay

## 2020-08-22 ENCOUNTER — Inpatient Hospital Stay: Admission: RE | Admit: 2020-08-22 | Payer: Self-pay | Source: Ambulatory Visit

## 2020-08-22 ENCOUNTER — Ambulatory Visit (INDEPENDENT_AMBULATORY_CARE_PROVIDER_SITE_OTHER): Payer: Medicare Other

## 2020-08-22 DIAGNOSIS — M2042 Other hammer toe(s) (acquired), left foot: Secondary | ICD-10-CM | POA: Diagnosis not present

## 2020-08-25 NOTE — Progress Notes (Signed)
  Subjective:  Patient ID: Charles Lawson, male    DOB: Oct 15, 1940,  MRN: [id number]  Chief Complaint  Patient presents with  . Routine Post Op    POV #3 DOS 07/19/2020 AIKEN OSTEOTOMY HALLUX LT; METATARSAL OSTEOTOMY 2ND & 3RD LT; TAILORS BUNIONECTOMY LT; HAMMERTOE REPAIR 2ND & 3RD LT; OPEN TREAT MPJ DISLOCATION 2ND & 3RD LT    DOS: 07/19/20 Procedure: left foot 5th metatarsal ostectomy, correction hammertoe and angular deformities of 2,3, metatarsal osteotomy 2nd, 1st phalangeal Akin osteotomy   79 y.o. male returns for post-op check. Doing well, third toes is painful but he is doing well.  Has been WB in the boot  Review of Systems: Negative except as noted in the HPI. Denies N/V/F/Ch.   Objective:  There were no vitals filed for this visit. There is no height or weight on file to calculate BMI. Constitutional Well developed. Well nourished.  Vascular Foot warm and well perfused. Capillary refill normal to all digits.   Neurologic Normal speech. Oriented to person, place, and time. Epicritic sensation to light touch grossly present bilaterally.  Dermatologic Skin healing well without signs of infection. Skin edges well coapted without signs of infection.  Orthopedic: Tenderness to palpation noted about the surgical site. Kirschner wires intact and in position   Radiographs left foot: Good consolidation of Akin osteotomy, arthrodesis of PIPJ sites Assessment:   1. Hammertoe of left foot    Plan:  Patient was evaluated and treated and all questions answered.  S/p foot surgery left -Progressing as expected post-operatively. -Kirschner wires removed today -WB Status: WBAT in CAM boot.  When he is comfortable he may return to regular shoe gear  Return in about 1 month (around 09/22/2020) for new x-rays, 2 month POV.

## 2020-08-26 DIAGNOSIS — Z23 Encounter for immunization: Secondary | ICD-10-CM | POA: Diagnosis not present

## 2020-09-23 ENCOUNTER — Ambulatory Visit (INDEPENDENT_AMBULATORY_CARE_PROVIDER_SITE_OTHER): Payer: Medicare Other | Admitting: Podiatry

## 2020-09-23 ENCOUNTER — Other Ambulatory Visit: Payer: Self-pay

## 2020-09-23 ENCOUNTER — Ambulatory Visit (INDEPENDENT_AMBULATORY_CARE_PROVIDER_SITE_OTHER): Payer: Medicare Other

## 2020-09-23 ENCOUNTER — Encounter: Payer: Self-pay | Admitting: Podiatry

## 2020-09-23 DIAGNOSIS — M2042 Other hammer toe(s) (acquired), left foot: Secondary | ICD-10-CM | POA: Diagnosis not present

## 2020-09-23 DIAGNOSIS — M21622 Bunionette of left foot: Secondary | ICD-10-CM

## 2020-09-23 NOTE — Progress Notes (Signed)
  Subjective:  Patient ID: Charles Lawson, male    DOB: 12/19/1940,  MRN: [id number]  Chief Complaint  Patient presents with  . Routine Post Op    BUNIONECTOMY LT; HAMMERTOE REPAIR 2ND & 3RD LT; OPEN TREAT MPJ DISLOCATION 2ND & 3RD LT   "its doing   good other than the swelling"    DOS: 07/19/20 Procedure: left foot 5th metatarsal ostectomy, correction hammertoe and angular deformities of 2,3, metatarsal osteotomy 2nd, 1st phalangeal Akin osteotomy   80 y.o. male returns for post-op check. Doing well, notes some stiffness and pain, especially plantarly under the 2nd and 3rd toe.  Review of Systems: Negative except as noted in the HPI. Denies N/V/F/Ch.   Objective:  There were no vitals filed for this visit. There is no height or weight on file to calculate BMI. Constitutional Well developed. Well nourished.  Vascular Foot warm and well perfused. Capillary refill normal to all digits.   Neurologic Normal speech. Oriented to person, place, and time. Epicritic sensation to light touch grossly present bilaterally.  Dermatologic Well healed incisions  Orthopedic: Mild edema about 2nd, 3rd MTPs. Mild pain at end DF ROM 2nd.   Radiographs left foot: Osteotomy site stilll visible with some bony callus, screws in good position, alignment maintained, good healing of PIPJ arthrodesis sites Assessment:   1. Hammertoe of left foot   2. Tailor's bunion of left foot    Plan:  Patient was evaluated and treated and all questions answered.  S/p foot surgery left -Progressing as expected post-operatively. -Continue regular shoe gear, resume full activities -Discussed some pain and swelling is normal still at this point and will continue to improve. Continue ROM to tolerance, regular shoe gear and activity  Return in about 3 months (around 12/22/2020).

## 2020-12-24 ENCOUNTER — Ambulatory Visit: Payer: Medicare Other | Admitting: Podiatry

## 2021-03-20 DIAGNOSIS — Z85828 Personal history of other malignant neoplasm of skin: Secondary | ICD-10-CM | POA: Diagnosis not present

## 2021-03-20 DIAGNOSIS — D1801 Hemangioma of skin and subcutaneous tissue: Secondary | ICD-10-CM | POA: Diagnosis not present

## 2021-03-20 DIAGNOSIS — L570 Actinic keratosis: Secondary | ICD-10-CM | POA: Diagnosis not present

## 2021-03-20 DIAGNOSIS — D225 Melanocytic nevi of trunk: Secondary | ICD-10-CM | POA: Diagnosis not present

## 2021-03-20 DIAGNOSIS — L905 Scar conditions and fibrosis of skin: Secondary | ICD-10-CM | POA: Diagnosis not present

## 2021-03-20 DIAGNOSIS — L821 Other seborrheic keratosis: Secondary | ICD-10-CM | POA: Diagnosis not present

## 2021-03-20 DIAGNOSIS — L820 Inflamed seborrheic keratosis: Secondary | ICD-10-CM | POA: Diagnosis not present

## 2021-03-20 DIAGNOSIS — D485 Neoplasm of uncertain behavior of skin: Secondary | ICD-10-CM | POA: Diagnosis not present

## 2021-06-23 ENCOUNTER — Other Ambulatory Visit: Payer: Self-pay

## 2021-06-23 ENCOUNTER — Ambulatory Visit (INDEPENDENT_AMBULATORY_CARE_PROVIDER_SITE_OTHER): Payer: Medicare Other | Admitting: Internal Medicine

## 2021-06-23 ENCOUNTER — Encounter: Payer: Self-pay | Admitting: Internal Medicine

## 2021-06-23 ENCOUNTER — Other Ambulatory Visit: Payer: Self-pay | Admitting: Internal Medicine

## 2021-06-23 VITALS — BP 130/74 | HR 69 | Temp 98.8°F | Ht 71.0 in | Wt 205.0 lb

## 2021-06-23 DIAGNOSIS — N320 Bladder-neck obstruction: Secondary | ICD-10-CM | POA: Diagnosis not present

## 2021-06-23 DIAGNOSIS — E7800 Pure hypercholesterolemia, unspecified: Secondary | ICD-10-CM | POA: Diagnosis not present

## 2021-06-23 DIAGNOSIS — E559 Vitamin D deficiency, unspecified: Secondary | ICD-10-CM | POA: Diagnosis not present

## 2021-06-23 DIAGNOSIS — R972 Elevated prostate specific antigen [PSA]: Secondary | ICD-10-CM

## 2021-06-23 DIAGNOSIS — R7302 Impaired glucose tolerance (oral): Secondary | ICD-10-CM

## 2021-06-23 DIAGNOSIS — E538 Deficiency of other specified B group vitamins: Secondary | ICD-10-CM | POA: Diagnosis not present

## 2021-06-23 DIAGNOSIS — R030 Elevated blood-pressure reading, without diagnosis of hypertension: Secondary | ICD-10-CM

## 2021-06-23 DIAGNOSIS — Z23 Encounter for immunization: Secondary | ICD-10-CM | POA: Diagnosis not present

## 2021-06-23 LAB — CBC WITH DIFFERENTIAL/PLATELET
Basophils Absolute: 0.1 10*3/uL (ref 0.0–0.1)
Basophils Relative: 0.9 % (ref 0.0–3.0)
Eosinophils Absolute: 0.1 10*3/uL (ref 0.0–0.7)
Eosinophils Relative: 1.9 % (ref 0.0–5.0)
HCT: 49.4 % (ref 39.0–52.0)
Hemoglobin: 17 g/dL (ref 13.0–17.0)
Lymphocytes Relative: 33.7 % (ref 12.0–46.0)
Lymphs Abs: 2.1 10*3/uL (ref 0.7–4.0)
MCHC: 34.4 g/dL (ref 30.0–36.0)
MCV: 86.9 fl (ref 78.0–100.0)
Monocytes Absolute: 0.6 10*3/uL (ref 0.1–1.0)
Monocytes Relative: 10 % (ref 3.0–12.0)
Neutro Abs: 3.4 10*3/uL (ref 1.4–7.7)
Neutrophils Relative %: 53.5 % (ref 43.0–77.0)
Platelets: 187 10*3/uL (ref 150.0–400.0)
RBC: 5.69 Mil/uL (ref 4.22–5.81)
RDW: 13.1 % (ref 11.5–15.5)
WBC: 6.3 10*3/uL (ref 4.0–10.5)

## 2021-06-23 LAB — VITAMIN B12: Vitamin B-12: 245 pg/mL (ref 211–911)

## 2021-06-23 LAB — BASIC METABOLIC PANEL
BUN: 13 mg/dL (ref 6–23)
CO2: 26 mEq/L (ref 19–32)
Calcium: 9.7 mg/dL (ref 8.4–10.5)
Chloride: 103 mEq/L (ref 96–112)
Creatinine, Ser: 1.07 mg/dL (ref 0.40–1.50)
GFR: 66.74 mL/min (ref >60.00)
Glucose, Bld: 97 mg/dL (ref 70–99)
Potassium: 4.4 mEq/L (ref 3.5–5.1)
Sodium: 138 mEq/L (ref 135–145)

## 2021-06-23 LAB — LIPID PANEL
Cholesterol: 160 mg/dL (ref 0–200)
HDL: 44.7 mg/dL (ref >39.00)
LDL Cholesterol: 98 mg/dL (ref 0–99)
NonHDL: 115.5
Total CHOL/HDL Ratio: 4
Triglycerides: 89 mg/dL (ref 0.0–149.0)
VLDL: 17.8 mg/dL (ref 0.0–40.0)

## 2021-06-23 LAB — URINALYSIS, ROUTINE W REFLEX MICROSCOPIC
Bilirubin Urine: NEGATIVE
Hgb urine dipstick: NEGATIVE
Ketones, ur: NEGATIVE
Leukocytes,Ua: NEGATIVE
Nitrite: NEGATIVE
RBC / HPF: NONE SEEN (ref 0–?)
Specific Gravity, Urine: 1.02 (ref 1.000–1.030)
Total Protein, Urine: NEGATIVE
Urine Glucose: NEGATIVE
Urobilinogen, UA: 0.2 (ref 0.0–1.0)
pH: 5.5 (ref 5.0–8.0)

## 2021-06-23 LAB — HEMOGLOBIN A1C: Hgb A1c MFr Bld: 5.5 % (ref 4.6–6.5)

## 2021-06-23 LAB — HEPATIC FUNCTION PANEL
ALT: 31 U/L (ref 0–53)
AST: 21 U/L (ref 0–37)
Albumin: 4.4 g/dL (ref 3.5–5.2)
Alkaline Phosphatase: 53 U/L (ref 39–117)
Bilirubin, Direct: 0.2 mg/dL (ref 0.0–0.3)
Total Bilirubin: 0.9 mg/dL (ref 0.2–1.2)
Total Protein: 7 g/dL (ref 6.0–8.3)

## 2021-06-23 LAB — PSA: PSA: 4.99 ng/mL — ABNORMAL HIGH (ref 0.10–4.00)

## 2021-06-23 LAB — VITAMIN D 25 HYDROXY (VIT D DEFICIENCY, FRACTURES): VITD: 29.82 ng/mL — ABNORMAL LOW (ref 30.00–100.00)

## 2021-06-23 LAB — TSH: TSH: 1.45 u[IU]/mL (ref 0.35–5.50)

## 2021-06-23 MED ORDER — CHOLECALCIFEROL 50 MCG (2000 UT) PO TABS: ORAL_TABLET | ORAL | 99 refills | Status: DC

## 2021-06-23 NOTE — Assessment & Plan Note (Signed)
BP Readings from Last 3 Encounters:  06/23/21 130/74  07/19/20 121/76  06/20/20 132/78   Stable, pt to continue medical treatment  - wt control, low salt

## 2021-06-23 NOTE — Patient Instructions (Addendum)
Please consider the Novavax covid vaccine  You had the flu shot today  Please continue all other medications as before, and refills have been done if requested.  Please have the pharmacy call with any other refills you may need.  Please continue your efforts at being more active, low cholesterol diet, and weight control.  You are otherwise up to date with prevention measures today.  Please keep your appointments with your specialists as you may have planned  Please go to the LAB at the blood drawing area for the tests to be done  You will be contacted by phone if any changes need to be made immediately.  Otherwise, you will receive a letter about your results with an explanation, but please check with MyChart first.  Please remember to sign up for MyChart if you have not done so, as this will be important to you in the future with finding out test results, communicating by private email, and scheduling acute appointments online when needed.  Please make an Appointment to return for your 1 year visit, or sooner if needed

## 2021-06-23 NOTE — Assessment & Plan Note (Signed)
Last vitamin D Lab Results  Component Value Date   VD25OH 33.63 04/02/2020   Low normal, to start oral replacement

## 2021-06-23 NOTE — Assessment & Plan Note (Signed)
Lab Results  Component Value Date   LDLCALC 98 06/23/2021   Stable, pt to continue current low chol diet

## 2021-06-23 NOTE — Assessment & Plan Note (Signed)
Lab Results  Component Value Date   HGBA1C 5.5 06/23/2021   Stable, pt to continue current medical treatment  - diet

## 2021-06-23 NOTE — Progress Notes (Signed)
Patient ID: Charles Lawson, male   DOB: 09-21-1941, 80 y.o.   MRN: [id number]         Chief Complaint: yearly exam       HPI:  Charles Lawson is a 80 y.o. male here overall doing ok.  Pt denies chest pain, increased sob or doe, wheezing, orthopnea, PND, increased LE swelling, palpitations, dizziness or syncope.   Pt denies polydipsia, polyuria, or new focal neuro s/s. Not taking vit d .  No new compalints   Pt denies fever, wt loss, night sweats, loss of appetite, or other constitutional symptoms   BP at home < 140/90   Wt Readings from Last 3 Encounters:  06/23/21 205 lb (93 kg)  07/19/20 198 lb 8 oz (90 kg)  06/20/20 202 lb (91.6 kg)   BP Readings from Last 3 Encounters:  06/23/21 130/74  07/19/20 121/76  06/20/20 132/78   Immunization History  Administered Date(s) Administered   Fluad Quad(high Dose 65+) 06/16/2019, 06/20/2020, 06/23/2021   Influenza, High Dose Seasonal PF 05/28/2015, 06/11/2017, 06/14/2018   Influenza,inj,Quad PF,6+ Mos 05/25/2014   PFIZER(Purple Top)SARS-COV-2 Vaccination 10/27/2019, 11/21/2019, 08/26/2020   Pneumococcal Conjugate-13 06/08/2014   Pneumococcal Polysaccharide-23 01/30/2010   Td 12/21/2006   Tdap 06/11/2017   Zoster, Live 08/27/2009   There are no preventive care reminders to display for this patient.     Past Medical History:  Diagnosis Date   Allergic rhinitis, cause unspecified    Arthritis    Blood pressure elevated without history of HTN    BPH (benign prostatic hyperplasia)    Diverticulosis of colon    ED (erectile dysfunction)    History of basal cell carcinoma (BCC) excision    Hyperlipidemia    Impaired glucose tolerance 04/03/2013   Personal history of colonic polyps 06/07/2012   Past Surgical History:  Procedure Laterality Date   Barbie Banner OSTEOTOMY Left 07/19/2020   Procedure: Barbie Banner OSTEOTOMY;  Surgeon: Criselda Peaches, DPM;  Location: [place];  Service: Podiatry;  Laterality: Left;   BUNIONECTOMY Left  07/19/2020   Procedure: Ruthann Cancer;  Surgeon: Criselda Peaches, DPM;  Location: [facility];  Service: Podiatry;  Laterality: Left;   COLONOSCOPY  last one 10-06-2017  dr Carlean Purl   [facility] TOE SURGERY Left 07/19/2020   Procedure: HAMMER TOE CORRECTION;  Surgeon: Criselda Peaches, DPM;  Location: [place];  Service: Podiatry;  Laterality: Left;   INGUINAL HERNIA REPAIR Right 11-21-2008  @ [place] OSTEOTOMY Left 07/19/2020   Procedure: METATARSAL OSTEOTOMY;  Surgeon: Criselda Peaches, DPM;  Location: [place];  Service: Podiatry;  Laterality: Left;   ORIF TOE FRACTURE Left 07/19/2020   Procedure: OPEN TREATMENT OF METARSAL PHALANGEAL JOINT DISLOCATION;  Surgeon: Criselda Peaches, DPM;  Location: [place];  Service: Podiatry;  Laterality: Left;  ANKLE BLOCK WITH THIGH TOURNIQUET    reports that he has never smoked. He has never used smokeless tobacco. He reports current alcohol use. He reports that he does not use drugs. family history includes COPD in his mother; Cancer in his father; Lung cancer in his father. Allergies  Allergen Reactions   Penicillins     Unknown childhood reaction   Current Outpatient Medications on File Prior to Visit  Medication Sig Dispense Refill   Aspirin-Acetaminophen-Caffeine (GOODY HEADACHE PO) Take by mouth as needed.     sildenafil (REVATIO) 20 MG tablet TAKE 2 AND  ONE-HALF TABLETS (50 MG TOTAL) BY MOUTH AS NEEDED FOR SEXUAL ACTIVITY (Patient not taking: Reported on 06/23/2021) 25 tablet 0   No current facility-administered medications on file prior to visit.        ROS:  All others reviewed and negative.  Objective        PE:  BP 130/74 (BP Location: Left Arm, Patient Position: Sitting, Cuff Size: Large)   Pulse 69   Temp 98.8 F (37.1 C) (Oral)   Ht 5\' 11"  (1.803 m)   Wt 205 lb (93 kg)   SpO2 96%   BMI 28.59 kg/m                  Constitutional: Pt appears in NAD               HENT: Head: NCAT.                Right Ear: External ear normal.                 Left Ear: External ear normal.                Eyes: . Pupils are equal, round, and reactive to light. Conjunctivae and EOM are normal               Nose: without d/c or deformity               Neck: Neck supple. Gross normal ROM               Cardiovascular: Normal rate and regular rhythm.                 Pulmonary/Chest: Effort normal and breath sounds without rales or wheezing.                Abd:  Soft, NT, ND, + BS, no organomegaly               Neurological: Pt is alert. At baseline orientation, motor grossly intact               Skin: Skin is warm. No rashes, no other new lesions, LE edema - none               Psychiatric: Pt behavior is normal without agitation   Micro: none  Cardiac tracings I have personally interpreted today:  none  Pertinent Radiological findings (summarize): none   Lab Results  Component Value Date   WBC 6.3 06/23/2021   HGB 17.0 06/23/2021   HCT 49.4 06/23/2021   PLT 187.0 06/23/2021   GLUCOSE 97 06/23/2021   CHOL 160 06/23/2021   TRIG 89.0 06/23/2021   HDL 44.70 06/23/2021   LDLDIRECT 88.0 04/02/2020   LDLCALC 98 06/23/2021   ALT 31 06/23/2021   AST 21 06/23/2021   NA 138 06/23/2021   K 4.4 06/23/2021   CL 103 06/23/2021   CREATININE 1.07 06/23/2021   BUN 13 06/23/2021   CO2 26 06/23/2021   TSH 1.45 06/23/2021   PSA 4.99 (H) 06/23/2021   HGBA1C 5.5 06/23/2021   Assessment/Plan:  Charles Lawson is a 80 y.o. White or Caucasian [1] male with  has a past medical history of Allergic rhinitis, cause unspecified, Arthritis, Blood pressure elevated without history of HTN, BPH (benign prostatic hyperplasia), Diverticulosis of colon, ED (erectile dysfunction), History of basal cell carcinoma (BCC) excision, Hyperlipidemia, Impaired glucose tolerance (04/03/2013), and Personal history of colonic polyps (06/07/2012).  Vitamin D  deficiency  Last vitamin D Lab Results  Component Value Date   VD25OH 33.63 04/02/2020   Low normal, to start oral replacement   Hyperlipidemia Lab Results  Component Value Date   LDLCALC 98 06/23/2021   Stable, pt to continue current low chol diet   Impaired glucose tolerance Lab Results  Component Value Date   HGBA1C 5.5 06/23/2021   Stable, pt to continue current medical treatment  - diet   Blood pressure elevated without history of HTN BP Readings from Last 3 Encounters:  06/23/21 130/74  07/19/20 121/76  06/20/20 132/78   Stable, pt to continue medical treatment  - wt control, low salt  Followup: Return in about 1 year (around 06/23/2022).  Cathlean Cower, MD 06/23/2021 10:25 PM [facility]

## 2021-08-22 DIAGNOSIS — R972 Elevated prostate specific antigen [PSA]: Secondary | ICD-10-CM | POA: Diagnosis not present

## 2021-09-18 ENCOUNTER — Telehealth (INDEPENDENT_AMBULATORY_CARE_PROVIDER_SITE_OTHER): Payer: Medicare Other | Admitting: Family Medicine

## 2021-09-18 DIAGNOSIS — R0981 Nasal congestion: Secondary | ICD-10-CM | POA: Diagnosis not present

## 2021-09-18 DIAGNOSIS — R059 Cough, unspecified: Secondary | ICD-10-CM | POA: Diagnosis not present

## 2021-09-18 MED ORDER — PROMETHAZINE-DM 6.25-15 MG/5ML PO SYRP: 5.0000 mL | ORAL_SOLUTION | Freq: Four times a day (QID) | ORAL | 0 refills | Status: DC | PRN

## 2021-09-18 MED ORDER — DOXYCYCLINE HYCLATE 100 MG PO TABS: 100.0000 mg | ORAL_TABLET | Freq: Two times a day (BID) | ORAL | 0 refills | Status: DC

## 2021-09-18 NOTE — Progress Notes (Signed)
Virtual Visit via Video Note  I connected with Charles Lawson  on 09/18/21 at 10:20 AM EST by a video enabled telemedicine application and verified that I am speaking with the correct person using two identifiers.  Location patient: home, [place] Location provider:work or home office Persons participating in the virtual visit: patient, provider, pt's wife  I discussed the limitations of evaluation and management by telemedicine and the availability of in person appointments. The patient expressed understanding and agreed to proceed.   HPI:  Acute telemedicine visit for sinus congestion and cough: -Onset: over a week ago -did a covid test yesterday which was negative -Symptoms include: sinus congestion, cough, some SOB when he coughs, some sinus discomfort -Denies:fevers, CP, SOB except when coughing -Has tried: [facility] cold and flu -Pertinent past medical history: seasonal allergies -Pertinent medication allergies:  Allergies  Allergen Reactions   Penicillins     Unknown childhood reaction  -COVID-19 vaccine status: Immunization History  Administered Date(s) Administered   Fluad Quad(high Dose 65+) 06/16/2019, 06/20/2020, 06/23/2021   Influenza, High Dose Seasonal PF 05/28/2015, 06/11/2017, 06/14/2018   Influenza,inj,Quad PF,6+ Mos 05/25/2014   PFIZER(Purple Top)SARS-COV-2 Vaccination 10/27/2019, 11/21/2019, 08/26/2020   Pneumococcal Conjugate-13 06/08/2014   Pneumococcal Polysaccharide-23 01/30/2010   Td 12/21/2006   Tdap 06/11/2017   Zoster, Live 08/27/2009    ROS: See pertinent positives and negatives per HPI.  Past Medical History:  Diagnosis Date   Allergic rhinitis, cause unspecified    Arthritis    Blood pressure elevated without history of HTN    BPH (benign prostatic hyperplasia)    Diverticulosis of colon    ED (erectile dysfunction)    History of basal cell carcinoma (BCC) excision    Hyperlipidemia    Impaired glucose tolerance 04/03/2013   Personal history of colonic  polyps 06/07/2012    Past Surgical History:  Procedure Laterality Date   Barbie Banner OSTEOTOMY Left 07/19/2020   Procedure: Barbie Banner OSTEOTOMY;  Surgeon: Criselda Peaches, DPM;  Location: [place];  Service: Podiatry;  Laterality: Left;   BUNIONECTOMY Left 07/19/2020   Procedure: Ruthann Cancer;  Surgeon: Criselda Peaches, DPM;  Location: [facility];  Service: Podiatry;  Laterality: Left;   COLONOSCOPY  last one 10-06-2017  dr Carlean Purl   [facility] TOE SURGERY Left 07/19/2020   Procedure: HAMMER TOE CORRECTION;  Surgeon: Criselda Peaches, DPM;  Location: [place];  Service: Podiatry;  Laterality: Left;   INGUINAL HERNIA REPAIR Right 11-21-2008  @ [place] OSTEOTOMY Left 07/19/2020   Procedure: METATARSAL OSTEOTOMY;  Surgeon: Criselda Peaches, DPM;  Location: [place];  Service: Podiatry;  Laterality: Left;   ORIF TOE FRACTURE Left 07/19/2020   Procedure: OPEN TREATMENT OF METARSAL PHALANGEAL JOINT DISLOCATION;  Surgeon: Criselda Peaches, DPM;  Location: [place];  Service: Podiatry;  Laterality: Left;  ANKLE BLOCK WITH THIGH TOURNIQUET     Current Outpatient Medications:    doxycycline (VIBRA-TABS) 100 MG tablet, Take 1 tablet (100 mg total) by mouth 2 (two) times daily., Disp: 14 tablet, Rfl: 0   promethazine-dextromethorphan (PROMETHAZINE-DM) 6.25-15 MG/5ML syrup, Take 5 mLs by mouth 4 (four) times daily as needed for cough., Disp: 118 mL, Rfl: 0   Aspirin-Acetaminophen-Caffeine (GOODY HEADACHE PO), Take by mouth as needed., Disp: , Rfl:    Cholecalciferol 50 MCG (2000 UT) TABS, 1 tab by mouth once daily, Disp: 30 tablet, Rfl: 99   sildenafil (REVATIO) 20 MG  tablet, TAKE 2 AND ONE-HALF TABLETS (50 MG TOTAL) BY MOUTH AS NEEDED FOR SEXUAL ACTIVITY (Patient not taking: Reported on 06/23/2021), Disp: 25 tablet, Rfl: 0  EXAM:  VITALS per patient if applicable:  GENERAL:  alert, oriented, appears well and in no acute distress  HEENT: atraumatic, conjunttiva clear, no obvious abnormalities on inspection of external nose and ears  NECK: normal movements of the head and neck  LUNGS: on inspection no signs of respiratory distress, breathing rate appears normal, no obvious gross SOB, gasping or wheezing  CV: no obvious cyanosis  MS: moves all visible extremities without noticeable abnormality  PSYCH/NEURO: pleasant and cooperative, no obvious depression or anxiety, speech and thought processing grossly intact  ASSESSMENT AND PLAN:  Discussed the following assessment and plan:  Nasal congestion  Cough, unspecified type  -we discussed possible serious and likely etiologies, options for evaluation and workup, limitations of telemedicine visit vs in person visit, treatment, treatment risks and precautions. Pt is agreeable to treatment via telemedicine at this moment. Query viral resp illness vs developing bacterial sinusitis vs other. He opted for cough rx, nasal saline, humidifier or steam and delayed abx if worsening or not improving after discussion risks/appropriate use/etc.  Advised to seek prompt in person care if worsening, new symptoms arise, or if is not improving with treatment. Discussed options for inperson care. Did let this patient know that I do telemedicine on Tuesdays and Thursdays for [place]. Advised to schedule follow up visit with PCP, [facility] or [facility] if any further questions or concerns to avoid delays in care.   I discussed the assessment and treatment plan with the patient. The patient was provided an opportunity to ask questions and all were answered. The patient agreed with the plan and demonstrated an understanding of the instructions.     Lucretia Kern, DO

## 2021-09-18 NOTE — Patient Instructions (Signed)
-  I sent the medication(s) we discussed to your pharmacy: Meds ordered this encounter  Medications   promethazine-dextromethorphan (PROMETHAZINE-DM) 6.25-15 MG/5ML syrup    Sig: Take 5 mLs by mouth 4 (four) times daily as needed for cough.    Dispense:  118 mL    Refill:  0   doxycycline (VIBRA-TABS) 100 MG tablet    Sig: Take 1 tablet (100 mg total) by mouth 2 (two) times daily.    Dispense:  14 tablet    Refill:  0   Try nasal saline, cough medication, steam or humidifier first. If worsening or not improving as expected can start the doxycycline as we discussed.   I hope you are feeling better soon!  Seek in person care promptly if your symptoms worsen, new concerns arise or you are not improving with treatment.  It was nice to meet you today. I help [place] out with telemedicine visits on Tuesdays and Thursdays and am happy to help if you need a virtual follow up visit on those days. Otherwise, if you have any concerns or questions following this visit please schedule a follow up visit with your Primary Care office or seek care at a local urgent care clinic to avoid delays in care

## 2021-10-09 ENCOUNTER — Encounter: Payer: Self-pay | Admitting: Internal Medicine

## 2021-10-09 ENCOUNTER — Ambulatory Visit (INDEPENDENT_AMBULATORY_CARE_PROVIDER_SITE_OTHER): Payer: Medicare Other | Admitting: Internal Medicine

## 2021-10-09 ENCOUNTER — Other Ambulatory Visit: Payer: Self-pay

## 2021-10-09 ENCOUNTER — Ambulatory Visit (INDEPENDENT_AMBULATORY_CARE_PROVIDER_SITE_OTHER): Payer: Medicare Other

## 2021-10-09 DIAGNOSIS — H6121 Impacted cerumen, right ear: Secondary | ICD-10-CM | POA: Diagnosis not present

## 2021-10-09 DIAGNOSIS — E559 Vitamin D deficiency, unspecified: Secondary | ICD-10-CM

## 2021-10-09 DIAGNOSIS — J4521 Mild intermittent asthma with (acute) exacerbation: Secondary | ICD-10-CM

## 2021-10-09 DIAGNOSIS — H6120 Impacted cerumen, unspecified ear: Secondary | ICD-10-CM | POA: Insufficient documentation

## 2021-10-09 DIAGNOSIS — J45909 Unspecified asthma, uncomplicated: Secondary | ICD-10-CM | POA: Diagnosis not present

## 2021-10-09 DIAGNOSIS — R059 Cough, unspecified: Secondary | ICD-10-CM | POA: Diagnosis not present

## 2021-10-09 MED ORDER — HYDROCODONE BIT-HOMATROP MBR 5-1.5 MG/5ML PO SOLN: 5.0000 mL | Freq: Three times a day (TID) | ORAL | 0 refills | Status: DC | PRN

## 2021-10-09 MED ORDER — DOXYCYCLINE HYCLATE 100 MG PO TABS: 100.0000 mg | ORAL_TABLET | Freq: Two times a day (BID) | ORAL | 0 refills | Status: DC

## 2021-10-09 MED ORDER — METHYLPREDNISOLONE 4 MG PO TBPK: ORAL_TABLET | ORAL | 0 refills | Status: DC

## 2021-10-09 NOTE — Assessment & Plan Note (Signed)
Irrigate later after the "cold" is gone

## 2021-10-09 NOTE — Assessment & Plan Note (Signed)
Cont w/OTC Vit D

## 2021-10-09 NOTE — Assessment & Plan Note (Signed)
Take Doxy po Hycodan cough syr Medrol pack CXR

## 2021-10-09 NOTE — Patient Instructions (Addendum)
You can use over-the-counter  "cold" medicines  such as "Tylenol cold" , "Advil cold",  "Mucinex" or" Mucinex D"  for cough and congestion.   Avoid decongestants if you have high blood pressure and use "Afrin" nasal spray for nasal congestion as directed. Use " Delsym" or" Robitussin" cough syrup varietis for cough.  You can use plain "Tylenol" or "Advil" for fever, chills and achyness. Use Halls or Ricola cough drops.     Irrigate R ear later after the "cold" is gone

## 2021-10-09 NOTE — Progress Notes (Signed)
Subjective:  Patient ID: Charles Lawson, male    DOB: December 22, 1940  Age: 81 y.o. MRN: [id number]  CC: No chief complaint on file.   HPI Charles Lawson presents for dry cough and wheezing x 1 mo. No SOB. Pt saw Dr Maudie Mercury virtually - given Doxy, never took it. Getting worse  Outpatient Medications Prior to Visit  Medication Sig Dispense Refill   Aspirin-Acetaminophen-Caffeine (GOODY HEADACHE PO) Take by mouth as needed.     Cholecalciferol 50 MCG (2000 UT) TABS 1 tab by mouth once daily 30 tablet 99   sildenafil (REVATIO) 20 MG tablet TAKE 2 AND ONE-HALF TABLETS (50 MG TOTAL) BY MOUTH AS NEEDED FOR SEXUAL ACTIVITY (Patient not taking: Reported on 06/23/2021) 25 tablet 0   doxycycline (VIBRA-TABS) 100 MG tablet Take 1 tablet (100 mg total) by mouth 2 (two) times daily. 14 tablet 0   promethazine-dextromethorphan (PROMETHAZINE-DM) 6.25-15 MG/5ML syrup Take 5 mLs by mouth 4 (four) times daily as needed for cough. 118 mL 0   No facility-administered medications prior to visit.    ROS: Review of Systems  Constitutional:  Positive for fatigue. Negative for appetite change and unexpected weight change.  HENT:  Negative for congestion, nosebleeds, sneezing, sore throat and trouble swallowing.   Eyes:  Negative for itching and visual disturbance.  Respiratory:  Positive for cough. Negative for shortness of breath.   Cardiovascular:  Negative for chest pain, palpitations and leg swelling.  Gastrointestinal:  Negative for abdominal distention, blood in stool, diarrhea and nausea.  Genitourinary:  Negative for frequency and hematuria.  Musculoskeletal:  Negative for back pain, gait problem, joint swelling and neck pain.  Skin:  Negative for rash.  Neurological:  Negative for dizziness, tremors, speech difficulty and weakness.  Psychiatric/Behavioral:  Negative for agitation, dysphoric mood and sleep disturbance. The patient is not nervous/anxious.    Objective:  BP (!) 146/90 (BP Location: Right Arm,  Patient Position: Sitting, Cuff Size: Large)    Pulse 92    Temp 98.9 F (37.2 C) (Oral)    Ht 5\' 11"  (1.803 m)    Wt 200 lb (90.7 kg)    SpO2 95%    BMI 27.89 kg/m   BP Readings from Last 3 Encounters:  10/09/21 (!) 146/90  06/23/21 130/74  07/19/20 121/76    Wt Readings from Last 3 Encounters:  10/09/21 200 lb (90.7 kg)  06/23/21 205 lb (93 kg)  07/19/20 198 lb 8 oz (90 kg)    Physical Exam Constitutional:      General: He is not in acute distress.    Appearance: He is well-developed.     Comments: NAD  Eyes:     Conjunctiva/sclera: Conjunctivae normal.     Pupils: Pupils are equal, round, and reactive to light.  Neck:     Thyroid: No thyromegaly.     Vascular: No JVD.  Cardiovascular:     Rate and Rhythm: Normal rate and regular rhythm.     Heart sounds: Normal heart sounds. No murmur heard.   No friction rub. No gallop.  Pulmonary:     Effort: Pulmonary effort is normal. No respiratory distress.     Breath sounds: Wheezing present. No rales.  Chest:     Chest wall: No tenderness.  Abdominal:     General: Bowel sounds are normal. There is no distension.     Palpations: Abdomen is soft. There is no mass.     Tenderness: There is no abdominal tenderness. There is no  guarding or rebound.  Musculoskeletal:        General: No tenderness. Normal range of motion.     Cervical back: Normal range of motion.  Lymphadenopathy:     Cervical: No cervical adenopathy.  Skin:    General: Skin is warm and dry.     Findings: No rash.  Neurological:     Mental Status: He is alert and oriented to person, place, and time.     Cranial Nerves: No cranial nerve deficit.     Motor: No abnormal muscle tone.     Coordination: Coordination normal.     Gait: Gait normal.     Deep Tendon Reflexes: Reflexes are normal and symmetric.  Psychiatric:        Behavior: Behavior normal.        Thought Content: Thought content normal.        Judgment: Judgment normal.  Wax R ear  Lab Results   Component Value Date   WBC 6.3 06/23/2021   HGB 17.0 06/23/2021   HCT 49.4 06/23/2021   PLT 187.0 06/23/2021   GLUCOSE 97 06/23/2021   CHOL 160 06/23/2021   TRIG 89.0 06/23/2021   HDL 44.70 06/23/2021   LDLDIRECT 88.0 04/02/2020   LDLCALC 98 06/23/2021   ALT 31 06/23/2021   AST 21 06/23/2021   NA 138 06/23/2021   K 4.4 06/23/2021   CL 103 06/23/2021   CREATININE 1.07 06/23/2021   BUN 13 06/23/2021   CO2 26 06/23/2021   TSH 1.45 06/23/2021   PSA 4.99 (H) 06/23/2021   HGBA1C 5.5 06/23/2021    DG Foot Complete Left  Result Date: 07/21/2020 CLINICAL DATA:  Status post bunionectomy and hammertoe correction. EXAM: LEFT FOOT - COMPLETE 3+ VIEW COMPARISON:  None. FINDINGS: Status post bunionectomy. Single screw placed across oblique fracture of the first proximal phalanx. K-wires have been placed across the second and third rays extending to the proximal metatarsal bones. Ligamentous anchors are noted at the head of the second metatarsal bone. There is diffuse midfoot soft tissue swelling. IMPRESSION: 1. Status post bunionectomy and pinning of first proximal phalanx and the second and third rays. 2. Diffuse midfoot soft tissue swelling. Electronically Signed   By: Fidela Salisbury M.D.   On: 07/21/2020 11:24    Assessment & Plan:   Problem List Items Addressed This Visit     Asthmatic bronchitis    Take Doxy po Hycodan cough syr Medrol pack CXR      Relevant Medications   methylPREDNISolone (MEDROL DOSEPAK) 4 MG TBPK tablet   Other Relevant Orders   DG Chest 2 View   Impacted ear wax    Irrigate later after the "cold" is gone      Vitamin D deficiency    Cont w/OTC Vit D         Meds ordered this encounter  Medications   doxycycline (VIBRA-TABS) 100 MG tablet    Sig: Take 1 tablet (100 mg total) by mouth 2 (two) times daily.    Dispense:  14 tablet    Refill:  0   methylPREDNISolone (MEDROL DOSEPAK) 4 MG TBPK tablet    Sig: As directed    Dispense:  21  tablet    Refill:  0   HYDROcodone bit-homatropine (HYCODAN) 5-1.5 MG/5ML syrup    Sig: Take 5 mLs by mouth every 8 (eight) hours as needed for cough.    Dispense:  240 mL    Refill:  0  Follow-up: Return in about 4 weeks (around 11/06/2021) for a follow-up visit.  Walker Kehr, MD

## 2021-11-06 ENCOUNTER — Other Ambulatory Visit: Payer: Self-pay

## 2021-11-06 ENCOUNTER — Ambulatory Visit (INDEPENDENT_AMBULATORY_CARE_PROVIDER_SITE_OTHER): Payer: Medicare Other | Admitting: Internal Medicine

## 2021-11-06 ENCOUNTER — Encounter: Payer: Self-pay | Admitting: Internal Medicine

## 2021-11-06 VITALS — BP 122/60 | HR 67 | Temp 98.7°F | Ht 71.0 in | Wt 208.0 lb

## 2021-11-06 DIAGNOSIS — E7800 Pure hypercholesterolemia, unspecified: Secondary | ICD-10-CM

## 2021-11-06 DIAGNOSIS — R030 Elevated blood-pressure reading, without diagnosis of hypertension: Secondary | ICD-10-CM

## 2021-11-06 DIAGNOSIS — E559 Vitamin D deficiency, unspecified: Secondary | ICD-10-CM

## 2021-11-06 DIAGNOSIS — R7302 Impaired glucose tolerance (oral): Secondary | ICD-10-CM

## 2021-11-06 NOTE — Progress Notes (Signed)
Patient ID: Charles Lawson, male   DOB: [birth year], 81 y.o.   MRN: [id number]         Chief Complaint:: yearly exam       HPI:  Charles Lawson is a 81 y.o. male with recent asthmatic bronchitis now essentially resolved except for persistent fatigue.  Pt denies chest pain, increased sob or doe, wheezing, orthopnea, PND, increased LE swelling, palpitations, dizziness or syncope.   Pt denies polydipsia, polyuria, or new focal neuro s/s.   Not taking vit d.  Denies urinary symptoms such as dysuria, frequency, urgency, flank pain, hematuria or n/v, fever, chills  Eleveated psa stable per urology per pt.  Truing to follow lower chol diet.   Pt denies fever, wt loss, night sweats, loss of appetite, or other constitutional symptoms   Wt Readings from Last 3 Encounters:  11/06/21 208 lb (94.3 kg)  10/09/21 200 lb (90.7 kg)  06/23/21 205 lb (93 kg)   BP Readings from Last 3 Encounters:  11/06/21 122/60  10/09/21 (!) 146/90  06/23/21 130/74   Immunization History  Administered Date(s) Administered   Fluad Quad(high Dose 65+) 06/16/2019, 06/20/2020, 06/23/2021   Influenza, High Dose Seasonal PF 05/28/2015, 06/11/2017, 06/14/2018   Influenza,inj,Quad PF,6+ Mos 05/25/2014   PFIZER(Purple Top)SARS-COV-2 Vaccination 10/27/2019, 11/21/2019, 08/26/2020   Pneumococcal Conjugate-13 06/08/2014   Pneumococcal Polysaccharide-23 01/30/2010   Td 12/21/2006   Tdap 06/11/2017   Zoster, Live 08/27/2009   There are no preventive care reminders to display for this patient.     Past Medical History:  Diagnosis Date   Allergic rhinitis, cause unspecified    Arthritis    Blood pressure elevated without history of HTN    BPH (benign prostatic hyperplasia)    Diverticulosis of colon    ED (erectile dysfunction)    History of basal cell carcinoma (BCC) excision    Hyperlipidemia    Impaired glucose tolerance 04/03/2013   Personal history of colonic polyps 06/07/2012   Past Surgical History:  Procedure  Laterality Date   Barbie Banner OSTEOTOMY Left 07/19/2020   Procedure: Barbie Banner OSTEOTOMY;  Surgeon: Criselda Peaches, DPM;  Location: [place];  Service: Podiatry;  Laterality: Left;   BUNIONECTOMY Left 07/19/2020   Procedure: Ruthann Cancer;  Surgeon: Criselda Peaches, DPM;  Location: [facility];  Service: Podiatry;  Laterality: Left;   COLONOSCOPY  last one 10-06-2017  dr Carlean Purl   [facility] TOE SURGERY Left 07/19/2020   Procedure: HAMMER TOE CORRECTION;  Surgeon: Criselda Peaches, DPM;  Location: [place];  Service: Podiatry;  Laterality: Left;   INGUINAL HERNIA REPAIR Right 11-21-2008  @ [place] OSTEOTOMY Left 07/19/2020   Procedure: METATARSAL OSTEOTOMY;  Surgeon: Criselda Peaches, DPM;  Location: [place];  Service: Podiatry;  Laterality: Left;   ORIF TOE FRACTURE Left 07/19/2020   Procedure: OPEN TREATMENT OF METARSAL PHALANGEAL JOINT DISLOCATION;  Surgeon: Criselda Peaches, DPM;  Location: [place];  Service: Podiatry;  Laterality: Left;  ANKLE BLOCK WITH THIGH TOURNIQUET    reports that he has never smoked. He has never used smokeless tobacco. He reports current alcohol use. He reports that he does not use drugs. family history includes COPD in his mother; Cancer in his father; Lung cancer in his father. Allergies  Allergen Reactions   Penicillins     Unknown childhood reaction   Current Outpatient Medications on File Prior to Visit  Medication Sig Dispense Refill   Aspirin-Acetaminophen-Caffeine (GOODY HEADACHE PO) Take by mouth as needed.     Cholecalciferol 50 MCG (2000 UT) TABS 1 tab by mouth once daily 30 tablet 99   HYDROcodone bit-homatropine (HYCODAN) 5-1.5 MG/5ML syrup Take 5 mLs by mouth every 8 (eight) hours as needed for cough. (Patient not taking: Reported on 11/06/2021) 240 mL 0   sildenafil (REVATIO) 20 MG tablet TAKE 2 AND ONE-HALF TABLETS (50 MG  TOTAL) BY MOUTH AS NEEDED FOR SEXUAL ACTIVITY (Patient not taking: Reported on 06/23/2021) 25 tablet 0   No current facility-administered medications on file prior to visit.        ROS:  All others reviewed and negative.  Objective        PE:  BP 122/60 (BP Location: Right Arm, Patient Position: Sitting, Cuff Size: Large)    Pulse 67    Temp 98.7 F (37.1 C) (Oral)    Ht 5\' 11"  (1.803 m)    Wt 208 lb (94.3 kg)    SpO2 96%    BMI 29.01 kg/m                 Constitutional: Pt appears in NAD               HENT: Head: NCAT.                Right Ear: External ear normal.                 Left Ear: External ear normal.                Eyes: . Pupils are equal, round, and reactive to light. Conjunctivae and EOM are normal               Nose: without d/c or deformity               Neck: Neck supple. Gross normal ROM               Cardiovascular: Normal rate and regular rhythm.                 Pulmonary/Chest: Effort normal and breath sounds without rales or wheezing.                Abd:  Soft, NT, ND, + BS, no organomegaly               Neurological: Pt is alert. At baseline orientation, motor grossly intact               Skin: Skin is warm. No rashes, no other new lesions, LE edema - none               Psychiatric: Pt behavior is normal without agitation   Micro: none  Cardiac tracings I have personally interpreted today:  none  Pertinent Radiological findings (summarize): none   Lab Results  Component Value Date   WBC 6.3 06/23/2021   HGB 17.0 06/23/2021   HCT 49.4 06/23/2021   PLT 187.0 06/23/2021   GLUCOSE 97 06/23/2021   CHOL 160 06/23/2021   TRIG 89.0 06/23/2021   HDL 44.70 06/23/2021   LDLDIRECT 88.0 04/02/2020   LDLCALC 98 06/23/2021   ALT 31 06/23/2021   AST 21 06/23/2021   NA 138 06/23/2021   K 4.4 06/23/2021   CL 103 06/23/2021   CREATININE 1.07 06/23/2021   BUN 13 06/23/2021   CO2 26 06/23/2021   TSH 1.45 06/23/2021  PSA 4.99 (H) 06/23/2021   HGBA1C 5.5  06/23/2021   Assessment/Plan:  Charles Lawson is a 81 y.o. White or Caucasian [1] male with  has a past medical history of Allergic rhinitis, cause unspecified, Arthritis, Blood pressure elevated without history of HTN, BPH (benign prostatic hyperplasia), Diverticulosis of colon, ED (erectile dysfunction), History of basal cell carcinoma (BCC) excision, Hyperlipidemia, Impaired glucose tolerance (04/03/2013), and Personal history of colonic polyps (06/07/2012).  Vitamin D deficiency Last vitamin D Lab Results  Component Value Date   VD25OH 29.82 (L) 06/23/2021   Low, to start oral replacement   Impaired glucose tolerance Lab Results  Component Value Date   HGBA1C 5.5 06/23/2021   Stable, pt to continue current medical treatment  - diet   Hyperlipidemia Lab Results  Component Value Date   LDLCALC 98 06/23/2021   stable, goal ldl < 100, pt to continue current low chol diet  Blood pressure elevated without history of HTN BP Readings from Last 3 Encounters:  11/06/21 122/60  10/09/21 (!) 146/90  06/23/21 130/74   Stable, pt to continue medical treatment  - low salt diet  Followup: Return in about 6 months (around 05/06/2022).  Cathlean Cower, MD 11/09/2021 10:07 PM [facility]

## 2021-11-06 NOTE — Patient Instructions (Signed)
Please continue all other medications as before, and refills have been done if requested. ° °Please have the pharmacy call with any other refills you may need. ° °Please continue your efforts at being more active, low cholesterol diet, and weight control. ° °You are otherwise up to date with prevention measures today. ° °Please keep your appointments with your specialists as you may have planned ° °Please make an Appointment to return in 6 months, or sooner if needed °

## 2021-11-09 ENCOUNTER — Encounter: Payer: Self-pay | Admitting: Internal Medicine

## 2021-11-09 NOTE — Assessment & Plan Note (Signed)
BP Readings from Last 3 Encounters:  11/06/21 122/60  10/09/21 (!) 146/90  06/23/21 130/74   Stable, pt to continue medical treatment  - low salt diet

## 2021-11-09 NOTE — Assessment & Plan Note (Signed)
Lab Results  Component Value Date   LDLCALC 98 06/23/2021   stable, goal ldl < 100, pt to continue current low chol diet

## 2021-11-09 NOTE — Assessment & Plan Note (Signed)
Lab Results  Component Value Date   HGBA1C 5.5 06/23/2021   Stable, pt to continue current medical treatment  - diet

## 2021-11-09 NOTE — Assessment & Plan Note (Signed)
Last vitamin D Lab Results  Component Value Date   VD25OH 29.82 (L) 06/23/2021   Low, to start oral replacement

## 2022-06-24 ENCOUNTER — Ambulatory Visit (INDEPENDENT_AMBULATORY_CARE_PROVIDER_SITE_OTHER): Payer: Medicare Other | Admitting: Internal Medicine

## 2022-06-24 VITALS — BP 140/90 | HR 75 | Temp 98.0°F | Ht 71.0 in | Wt 199.0 lb

## 2022-06-24 DIAGNOSIS — R030 Elevated blood-pressure reading, without diagnosis of hypertension: Secondary | ICD-10-CM | POA: Diagnosis not present

## 2022-06-24 DIAGNOSIS — E7800 Pure hypercholesterolemia, unspecified: Secondary | ICD-10-CM

## 2022-06-24 DIAGNOSIS — N529 Male erectile dysfunction, unspecified: Secondary | ICD-10-CM

## 2022-06-24 DIAGNOSIS — R9431 Abnormal electrocardiogram [ECG] [EKG]: Secondary | ICD-10-CM | POA: Diagnosis not present

## 2022-06-24 DIAGNOSIS — E559 Vitamin D deficiency, unspecified: Secondary | ICD-10-CM

## 2022-06-24 DIAGNOSIS — E538 Deficiency of other specified B group vitamins: Secondary | ICD-10-CM

## 2022-06-24 DIAGNOSIS — R7302 Impaired glucose tolerance (oral): Secondary | ICD-10-CM

## 2022-06-24 DIAGNOSIS — Z23 Encounter for immunization: Secondary | ICD-10-CM

## 2022-06-24 LAB — CBC WITH DIFFERENTIAL/PLATELET
Basophils Absolute: 0 10*3/uL (ref 0.0–0.1)
Basophils Relative: 0.5 % (ref 0.0–3.0)
Eosinophils Absolute: 0.1 10*3/uL (ref 0.0–0.7)
Eosinophils Relative: 1.9 % (ref 0.0–5.0)
HCT: 47.7 % (ref 39.0–52.0)
Hemoglobin: 16.4 g/dL (ref 13.0–17.0)
Lymphocytes Relative: 36.5 % (ref 12.0–46.0)
Lymphs Abs: 2.5 10*3/uL (ref 0.7–4.0)
MCHC: 34.4 g/dL (ref 30.0–36.0)
MCV: 87.9 fl (ref 78.0–100.0)
Monocytes Absolute: 0.7 10*3/uL (ref 0.1–1.0)
Monocytes Relative: 10.2 % (ref 3.0–12.0)
Neutro Abs: 3.6 10*3/uL (ref 1.4–7.7)
Neutrophils Relative %: 50.9 % (ref 43.0–77.0)
Platelets: 194 10*3/uL (ref 150.0–400.0)
RBC: 5.43 Mil/uL (ref 4.22–5.81)
RDW: 12.5 % (ref 11.5–15.5)
WBC: 7 10*3/uL (ref 4.0–10.5)

## 2022-06-24 LAB — BASIC METABOLIC PANEL
BUN: 10 mg/dL (ref 6–23)
CO2: 27 mEq/L (ref 19–32)
Calcium: 9.7 mg/dL (ref 8.4–10.5)
Chloride: 103 mEq/L (ref 96–112)
Creatinine, Ser: 1.07 mg/dL (ref 0.40–1.50)
GFR: 66.27 mL/min (ref >60.00)
Glucose, Bld: 105 mg/dL — ABNORMAL HIGH (ref 70–99)
Potassium: 4.4 mEq/L (ref 3.5–5.1)
Sodium: 138 mEq/L (ref 135–145)

## 2022-06-24 LAB — LIPID PANEL
Cholesterol: 144 mg/dL (ref 0–200)
HDL: 43.6 mg/dL (ref >39.00)
LDL Cholesterol: 84 mg/dL (ref 0–99)
NonHDL: 100.34
Total CHOL/HDL Ratio: 3
Triglycerides: 80 mg/dL (ref 0.0–149.0)
VLDL: 16 mg/dL (ref 0.0–40.0)

## 2022-06-24 LAB — HEPATIC FUNCTION PANEL
ALT: 31 U/L (ref 0–53)
AST: 25 U/L (ref 0–37)
Albumin: 4.4 g/dL (ref 3.5–5.2)
Alkaline Phosphatase: 50 U/L (ref 39–117)
Bilirubin, Direct: 0.2 mg/dL (ref 0.0–0.3)
Total Bilirubin: 0.9 mg/dL (ref 0.2–1.2)
Total Protein: 7.2 g/dL (ref 6.0–8.3)

## 2022-06-24 LAB — VITAMIN B12: Vitamin B-12: 366 pg/mL (ref 211–911)

## 2022-06-24 LAB — URINALYSIS, ROUTINE W REFLEX MICROSCOPIC
Bilirubin Urine: NEGATIVE
Hgb urine dipstick: NEGATIVE
Ketones, ur: NEGATIVE
Leukocytes,Ua: NEGATIVE
Nitrite: NEGATIVE
RBC / HPF: NONE SEEN (ref 0–?)
Specific Gravity, Urine: >=1.03 — AB (ref 1.000–1.030)
Total Protein, Urine: NEGATIVE
Urine Glucose: NEGATIVE
Urobilinogen, UA: 0.2 (ref 0.0–1.0)
pH: 5.5 (ref 5.0–8.0)

## 2022-06-24 LAB — TSH: TSH: 1.44 u[IU]/mL (ref 0.35–5.50)

## 2022-06-24 LAB — VITAMIN D 25 HYDROXY (VIT D DEFICIENCY, FRACTURES): VITD: 34.08 ng/mL (ref 30.00–100.00)

## 2022-06-24 LAB — HEMOGLOBIN A1C: Hgb A1c MFr Bld: 5.5 % (ref 4.6–6.5)

## 2022-06-24 MED ORDER — SILDENAFIL CITRATE 100 MG PO TABS: 50.0000 mg | ORAL_TABLET | Freq: Every day | ORAL | 11 refills | Status: DC | PRN

## 2022-06-24 NOTE — Progress Notes (Signed)
Patient ID: Charles Lawson, male   DOB: [birth year], 81 y.o.   MRN: [id number]        Chief Complaint: follow up HTN, HLD and ED, low vit d       HPI:  Charles Lawson is a 81 y.o. male here overall doing ok, Viagra no longer working well for ED, asks for change to cialis.  BP has been < 140/90 at home, does not want med change for now, will continue to monitor.  Pt willing for Card Ct score testing.  Pt denies chest pain, increased sob or doe, wheezing, orthopnea, PND, increased LE swelling, palpitations, dizziness or syncope.   Pt denies polydipsia, polyuria, or new focal neuro s/s.    Pt denies fever, wt loss, night sweats, loss of appetite, or other constitutional symptoms  Due for flu shot       Wt Readings from Last 3 Encounters:  06/24/22 199 lb (90.3 kg)  11/06/21 208 lb (94.3 kg)  10/09/21 200 lb (90.7 kg)   BP Readings from Last 3 Encounters:  06/24/22 (!) 140/90  11/06/21 122/60  10/09/21 (!) 146/90         Past Medical History:  Diagnosis Date   Allergic rhinitis, cause unspecified    Arthritis    Blood pressure elevated without history of HTN    BPH (benign prostatic hyperplasia)    Diverticulosis of colon    ED (erectile dysfunction)    History of basal cell carcinoma (BCC) excision    Hyperlipidemia    Impaired glucose tolerance 04/03/2013   Personal history of colonic polyps 06/07/2012   Past Surgical History:  Procedure Laterality Date   Barbie Banner OSTEOTOMY Left 07/19/2020   Procedure: Barbie Banner OSTEOTOMY;  Surgeon: Criselda Peaches, DPM;  Location: [place];  Service: Podiatry;  Laterality: Left;   BUNIONECTOMY Left 07/19/2020   Procedure: Ruthann Cancer;  Surgeon: Criselda Peaches, DPM;  Location: [facility];  Service: Podiatry;  Laterality: Left;   COLONOSCOPY  last one 10-06-2017  dr Carlean Purl   [facility] TOE SURGERY Left 07/19/2020   Procedure: HAMMER TOE CORRECTION;  Surgeon: Criselda Peaches, DPM;  Location: [place];  Service: Podiatry;  Laterality: Left;   INGUINAL HERNIA REPAIR Right 11-21-2008  @ [place] OSTEOTOMY Left 07/19/2020   Procedure: METATARSAL OSTEOTOMY;  Surgeon: Criselda Peaches, DPM;  Location: [place];  Service: Podiatry;  Laterality: Left;   ORIF TOE FRACTURE Left 07/19/2020   Procedure: OPEN TREATMENT OF METARSAL PHALANGEAL JOINT DISLOCATION;  Surgeon: Criselda Peaches, DPM;  Location: [place];  Service: Podiatry;  Laterality: Left;  ANKLE BLOCK WITH THIGH TOURNIQUET    reports that he has never smoked. He has never used smokeless tobacco. He reports current alcohol use. He reports that he does not use drugs. family history includes COPD in his mother; Cancer in his father; Lung cancer in his father. Allergies  Allergen Reactions   Penicillins     Unknown childhood reaction   Current Outpatient Medications on File Prior to Visit  Medication Sig Dispense Refill   Aspirin-Acetaminophen-Caffeine (GOODY HEADACHE PO) Take by mouth as needed.     Cholecalciferol 50 MCG (2000 UT) TABS 1 tab by mouth once daily 30 tablet 99   HYDROcodone bit-homatropine (HYCODAN) 5-1.5 MG/5ML syrup Take 5 mLs by mouth every 8 (eight) hours as needed for cough. 240 mL 0   No  current facility-administered medications on file prior to visit.        ROS:  All others reviewed and negative.  Objective        PE:  BP (!) 140/90 (BP Location: Right Arm, Patient Position: Sitting, Cuff Size: Large)   Pulse 75   Temp 98 F (36.7 C) (Oral)   Ht '5\' 11"'$  (1.803 m)   Wt 199 lb (90.3 kg)   SpO2 97%   BMI 27.75 kg/m                 Constitutional: Pt appears in NAD               HENT: Head: NCAT.                Right Ear: External ear normal.                 Left Ear: External ear normal.                Eyes: . Pupils are equal, round, and reactive to light. Conjunctivae and EOM are normal               Nose: without d/c or  deformity               Neck: Neck supple. Gross normal ROM               Cardiovascular: Normal rate and regular rhythm.                 Pulmonary/Chest: Effort normal and breath sounds without rales or wheezing.                Abd:  Soft, NT, ND, + BS, no organomegaly               Neurological: Pt is alert. At baseline orientation, motor grossly intact               Skin: Skin is warm. No rashes, no other new lesions, LE edema - none               Psychiatric: Pt behavior is normal without agitation   Micro: none  Cardiac tracings I have personally interpreted today:  none  Pertinent Radiological findings (summarize): none   Lab Results  Component Value Date   WBC 7.0 06/24/2022   HGB 16.4 06/24/2022   HCT 47.7 06/24/2022   PLT 194.0 06/24/2022   GLUCOSE 105 (H) 06/24/2022   CHOL 144 06/24/2022   TRIG 80.0 06/24/2022   HDL 43.60 06/24/2022   LDLDIRECT 88.0 04/02/2020   LDLCALC 84 06/24/2022   ALT 31 06/24/2022   AST 25 06/24/2022   NA 138 06/24/2022   K 4.4 06/24/2022   CL 103 06/24/2022   CREATININE 1.07 06/24/2022   BUN 10 06/24/2022   CO2 27 06/24/2022   TSH 1.44 06/24/2022   PSA 4.99 (H) 06/23/2021   HGBA1C 5.5 06/24/2022   Assessment/Plan:  Charles Lawson is a 81 y.o. White or Caucasian [1] male with  has a past medical history of Allergic rhinitis, cause unspecified, Arthritis, Blood pressure elevated without history of HTN, BPH (benign prostatic hyperplasia), Diverticulosis of colon, ED (erectile dysfunction), History of basal cell carcinoma (BCC) excision, Hyperlipidemia, Impaired glucose tolerance (04/03/2013), and Personal history of colonic polyps (06/07/2012).  Vitamin D deficiency Last vitamin D Lab Results  Component Value Date   VD25OH 34.08 06/24/2022   Low, reminded to start oral replacement  Impaired glucose tolerance Lab Results  Component Value Date   HGBA1C 5.5 06/24/2022   Stable, pt to continue current medical treatment  - diet, wt  control, excercise   Hyperlipidemia Lab Results  Component Value Date   LDLCALC 84 06/24/2022   Stable, pt to continue current low chol diet, now for cardiac CT score and consider statin if abnormal    Blood pressure elevated without history of HTN BP Readings from Last 3 Encounters:  06/24/22 (!) 140/90  11/06/21 122/60  10/09/21 (!) 146/90   Uncontrolled, pt declines change in tx for now, cont to monitor at home   Erectile dysfunction Ok for change viagra to cialis prn,  to f/u any worsening symptoms or concerns Followup: Return in about 6 months (around 12/24/2022).  Cathlean Cower, MD 06/27/2022 3:14 PM [facility]

## 2022-06-24 NOTE — Patient Instructions (Signed)
You had the flu shot today  Please continue all other medications as before, and refills have been done if requested.  Please have the pharmacy call with any other refills you may need.  Please continue your efforts at being more active, low cholesterol diet, and weight control.  You are otherwise up to date with prevention measures today.  Please keep your appointments with your specialists as you may have planned  You will be contacted regarding the referral for: Cardiac CT score testing  Please go to the LAB at the blood drawing area for the tests to be done  You will be contacted by phone if any changes need to be made immediately.  Otherwise, you will receive a letter about your results with an explanation, but please check with MyChart first.  Please remember to sign up for MyChart if you have not done so, as this will be important to you in the future with finding out test results, communicating by private email, and scheduling acute appointments online when needed.  Please make an Appointment to return in 6 months, or sooner if needed

## 2022-06-27 ENCOUNTER — Encounter: Payer: Self-pay | Admitting: Internal Medicine

## 2022-06-27 MED ORDER — TADALAFIL 20 MG PO TABS: 10.0000 mg | ORAL_TABLET | ORAL | 11 refills | Status: DC | PRN

## 2022-06-27 NOTE — Assessment & Plan Note (Signed)
Last vitamin D Lab Results  Component Value Date   VD25OH 34.08 06/24/2022   Low, reminded to start oral replacement

## 2022-06-27 NOTE — Assessment & Plan Note (Signed)
BP Readings from Last 3 Encounters:  06/24/22 (!) 140/90  11/06/21 122/60  10/09/21 (!) 146/90   Uncontrolled, pt declines change in tx for now, cont to monitor at home

## 2022-06-27 NOTE — Assessment & Plan Note (Signed)
Lab Results  Component Value Date   HGBA1C 5.5 06/24/2022   Stable, pt to continue current medical treatment  - diet, wt control, excercise

## 2022-06-27 NOTE — Assessment & Plan Note (Signed)
Ok for change viagra to cialis prn,  to f/u any worsening symptoms or concerns

## 2022-06-27 NOTE — Assessment & Plan Note (Signed)
Lab Results  Component Value Date   LDLCALC 84 06/24/2022   Stable, pt to continue current low chol diet, now for cardiac CT score and consider statin if abnormal

## 2022-07-28 DIAGNOSIS — L821 Other seborrheic keratosis: Secondary | ICD-10-CM | POA: Diagnosis not present

## 2022-07-28 DIAGNOSIS — D225 Melanocytic nevi of trunk: Secondary | ICD-10-CM | POA: Diagnosis not present

## 2022-07-28 DIAGNOSIS — L814 Other melanin hyperpigmentation: Secondary | ICD-10-CM | POA: Diagnosis not present

## 2022-07-28 DIAGNOSIS — Z85828 Personal history of other malignant neoplasm of skin: Secondary | ICD-10-CM | POA: Diagnosis not present

## 2022-07-28 DIAGNOSIS — L918 Other hypertrophic disorders of the skin: Secondary | ICD-10-CM | POA: Diagnosis not present

## 2022-07-28 DIAGNOSIS — L570 Actinic keratosis: Secondary | ICD-10-CM | POA: Diagnosis not present

## 2022-07-28 DIAGNOSIS — Z08 Encounter for follow-up examination after completed treatment for malignant neoplasm: Secondary | ICD-10-CM | POA: Diagnosis not present

## 2022-08-11 ENCOUNTER — Ambulatory Visit (INDEPENDENT_AMBULATORY_CARE_PROVIDER_SITE_OTHER): Payer: Medicare Other

## 2022-08-11 ENCOUNTER — Ambulatory Visit (HOSPITAL_BASED_OUTPATIENT_CLINIC_OR_DEPARTMENT_OTHER)
Admission: RE | Admit: 2022-08-11 | Discharge: 2022-08-11 | Disposition: A | Discharge status: Home / Self Care | Payer: Medicare Other | Source: Ambulatory Visit | Attending: Internal Medicine | Admitting: Internal Medicine

## 2022-08-11 VITALS — Ht 71.0 in

## 2022-08-11 DIAGNOSIS — Z0000 Encounter for general adult medical examination without abnormal findings: Secondary | ICD-10-CM

## 2022-08-11 DIAGNOSIS — R9431 Abnormal electrocardiogram [ECG] [EKG]: Secondary | ICD-10-CM | POA: Insufficient documentation

## 2022-08-11 DIAGNOSIS — E7800 Pure hypercholesterolemia, unspecified: Secondary | ICD-10-CM | POA: Insufficient documentation

## 2022-08-11 DIAGNOSIS — R7302 Impaired glucose tolerance (oral): Secondary | ICD-10-CM | POA: Insufficient documentation

## 2022-08-11 NOTE — Progress Notes (Signed)
Virtual Visit via Telephone Note  I connected with  Charles Lawson on 08/11/22 at  2:00 PM EST by telephone and verified that I am speaking with the correct person using two identifiers.  Location: Patient: Home Provider: [place] Persons participating in the virtual visit: [place]   I discussed the limitations, risks, security and privacy concerns of performing an evaluation and management service by telephone and the availability of in person appointments. The patient expressed understanding and agreed to proceed.  Interactive audio and video telecommunications were attempted between this nurse and patient, however failed, due to patient having technical difficulties OR patient did not have access to video capability.  We continued and completed visit with audio only.  Some vital signs may be absent or patient reported.   Sheral Flow, LPN  Subjective:   Charles Lawson is a 81 y.o. male who presents for Medicare Annual/Subsequent preventive examination.  Review of Systems     Cardiac Risk Factors include: advanced age (>25mn, >>48women);dyslipidemia;male gender;sedentary lifestyle     Objective:    Today's Vitals   08/11/22 1401  Height: '5\' 11"'$  (1.803 m)  PainSc: 0-No pain   Body mass index is 27.75 kg/m.     08/11/2022    2:03 PM 07/19/2020    7:31 AM 06/14/2018   10:04 AM 10/06/2017    7:49 AM 09/22/2017    8:28 AM  Advanced Directives  Does Patient Have a Medical Advance Directive? Yes Yes Yes Yes Yes  Type of AParamedicof AMineral WellsLiving will [place]Living will [place]Living will [place]uroraLiving will Living will;Healthcare Power of AVarnadoin Chart? No - copy requested No - copy requested       Current Medications (verified) Outpatient Encounter Medications as of 08/11/2022  Medication Sig    Aspirin-Acetaminophen-Caffeine (GOODY HEADACHE PO) Take by mouth as needed.   Cholecalciferol 50 MCG (2000 UT) TABS 1 tab by mouth once daily   tadalafil (CIALIS) 20 MG tablet Take 0.5-1 tablets (10-20 mg total) by mouth every other day as needed for erectile dysfunction.   HYDROcodone bit-homatropine (HYCODAN) 5-1.5 MG/5ML syrup Take 5 mLs by mouth every 8 (eight) hours as needed for cough. (Patient not taking: Reported on 08/11/2022)   No facility-administered encounter medications on file as of 08/11/2022.    Allergies (verified) Penicillins   History: Past Medical History:  Diagnosis Date   Allergic rhinitis, cause unspecified    Arthritis    Blood pressure elevated without history of HTN    BPH (benign prostatic hyperplasia)    Diverticulosis of colon    ED (erectile dysfunction)    History of basal cell carcinoma (BCC) excision    Hyperlipidemia    Impaired glucose tolerance 04/03/2013   Personal history of colonic polyps 06/07/2012   Past Surgical History:  Procedure Laterality Date   ABarbie BannerOSTEOTOMY Left 07/19/2020   Procedure: ABarbie BannerOSTEOTOMY;  Surgeon: MCriselda Peaches DPM;  [place]  Service: Podiatry;  Laterality: Left;   BUNIONECTOMY Left 07/19/2020   Procedure: TRuthann Cancer  Surgeon: MCriselda Peaches DPM;  [facility]  Service: [facility]iatry;  Laterality: Left;   COLONOSCOPY  last one 10-06-2017  dr gCarlean Purl  [facility]   Procedure: HAMMER TOE CORRECTION;  Surgeon: MCriselda Peaches DPM;  [place]  Service: Podiatry;  Laterality: Left;   INGUINAL HERNIA REPAIR Right 11-21-2008  @ [place] OSTEOTOMY Left 07/19/2020   Procedure: METATARSAL OSTEOTOMY;  Surgeon: Criselda Peaches, DPM;  Location: [place];  Service: Podiatry;  Laterality: Left;   ORIF TOE FRACTURE Left 07/19/2020   Procedure: OPEN  TREATMENT OF METARSAL PHALANGEAL JOINT DISLOCATION;  Surgeon: Criselda Peaches, DPM;  Location: [place];  Service: Podiatry;  Laterality: Left;  ANKLE BLOCK WITH THIGH TOURNIQUET   Family History  Problem Relation Age of Onset   COPD Mother    Cancer Father    Lung cancer Father    Colon cancer Neg Hx    Colon polyps Neg Hx    Rectal cancer Neg Hx    Stomach cancer Neg Hx    Social History   Socioeconomic History   Marital status: Married    Spouse name: Not on file   Number of children: 1   Years of education: Not on file   Highest education level: Not on file  Occupational History   Not on file  Tobacco Use   Smoking status: Never   Smokeless tobacco: Never  Vaping Use   Vaping Use: Never used  Substance and Sexual Activity   Alcohol use: Yes    Comment: occaional   Drug use: Never   Sexual activity: Yes  Other Topics Concern   Not on file  Social History Narrative   Not on file   Social Determinants of Health   Financial Resource Strain: Low Risk  (08/11/2022)   Overall Financial Resource Strain (CARDIA)    Difficulty of Paying Living Expenses: Not hard at all  Food Insecurity: No Food Insecurity (08/11/2022)   Hunger Vital Sign    Worried About Running Out of Food in the Last Year: Never true    Ran Out of Food in the Last Year: Never true  Transportation Needs: No Transportation Needs (08/11/2022)   PRAPARE - Hydrologist (Medical): No    Lack of Transportation (Non-Medical): No  Physical Activity: Inactive (08/11/2022)   Exercise Vital Sign    Days of Exercise per Week: 0 days    Minutes of Exercise per Session: 0 min  Stress: No Stress Concern Present (08/11/2022)   [place]    Feeling of Stress : Not at all  Social Connections: [place] (08/11/2022)   Social Connection and Isolation Panel [NHANES]    Frequency of Communication  with Friends and Family: More than three times a week    Frequency of Social Gatherings with Friends and Family: More than three times a week    Attends Religious Services: More than 4 times per year    Active Member of [employer] or Organizations: Yes    Attends Music therapist: More than 4 times per year    Marital Status: Married    Tobacco Counseling Counseling given: Not Answered   Clinical Intake:  Pre-visit preparation completed: Yes  Pain : No/denies pain Pain Score: 0-No pain     BMI - recorded: 27.77 (06/24/2022) Nutritional Status: BMI 25 -29 Overweight Nutritional Risks: None Diabetes: No  How often do you need to have someone help you when you read instructions, pamphlets, or other written materials from your doctor or pharmacy?: 1 - Never What is the last grade level you completed in school?: HSG  Diabetic? no  Interpreter  Needed?: No  Information entered by :: Lisette Abu, LPN.   Activities of Daily Living    08/11/2022    2:10 PM  In your present state of health, do you have any difficulty performing the following activities:  Hearing? 0  Vision? 0  Difficulty concentrating or making decisions? 0  Walking or climbing stairs? 0  Dressing or bathing? 0  Doing errands, shopping? 0  Preparing Food and eating ? N  Using the Toilet? N  In the past six months, have you accidently leaked urine? N  Do you have problems with loss of bowel control? N  Managing your Medications? N  Managing your Finances? N  Housekeeping or managing your Housekeeping? N    Patient Care Team: Biagio Borg, MD as PCP - General (Internal Medicine) Delsa Sale, OD as Consulting Physician (Optometry)  Indicate any recent Medical Services you may have received from other than [facility] providers in the past year (date may be approximate).     Assessment:   This is a routine wellness examination for Charles Lawson.  Hearing/Vision screen Hearing Screening -  Comments:: Denies hearing difficulties   Vision Screening - Comments:: Wears rx glasses - up to date with routine eye exams with Teodoro Spray, OD.   Dietary issues and exercise activities discussed: Current Exercise Habits: The patient does not participate in regular exercise at present, Exercise limited by: respiratory conditions(s);orthopedic condition(s)   Goals Addressed             This Visit's Progress    Client understands the importance of follow-up with providers by attending scheduled visits        Depression Screen    08/11/2022    2:10 PM 06/24/2022   10:09 AM 11/06/2021   10:20 AM 06/23/2021   10:29 AM 06/23/2021   10:09 AM 06/20/2020   11:20 AM 04/02/2020    3:23 PM  PHQ 2/9 Scores  PHQ - 2 Score 0  0 0 0 0 0  Exception Documentation  Patient refusal         Fall Risk    08/11/2022    2:04 PM 06/24/2022   10:09 AM 11/06/2021   10:20 AM 06/23/2021   10:28 AM 06/23/2021   10:09 AM  Fall Risk   Falls in the past year? 0 0 0 0 0  Number falls in past yr: 0  0 0 0  Injury with Fall? 0  0 0 0  Risk for fall due to : No Fall Risks      Follow up Falls prevention discussed        FALL RISK PREVENTION PERTAINING TO THE HOME:  Any stairs in or around the home? Yes  If so, are there any without handrails? No  Home free of loose throw rugs in walkways, pet beds, electrical cords, etc? Yes  Adequate lighting in your home to reduce risk of falls? Yes   ASSISTIVE DEVICES UTILIZED TO PREVENT FALLS:  Life alert? No  Use of a cane, walker or w/c? No  Grab bars in the bathroom? Yes  Shower chair or bench in shower? Yes  Elevated toilet seat or a handicapped toilet? No   TIMED UP AND GO: Phone Visit  Was the test performed? No .    Cognitive Function:        08/11/2022    2:11 PM  6CIT Screen  What Year? 0 points  What month? 0 points  What time? 0 points  Count back  from 20 0 points  Months in reverse 0 points  Repeat phrase 0 points  Total Score 0  points    Immunizations Immunization History  Administered Date(s) Administered   Fluad Quad(high Dose 65+) 06/16/2019, 06/20/2020, 06/23/2021, 06/24/2022   Influenza, High Dose Seasonal PF 05/28/2015, 06/11/2017, 06/14/2018   Influenza,inj,Quad PF,6+ Mos 05/25/2014   PFIZER(Purple Top)SARS-COV-2 Vaccination 10/27/2019, 11/21/2019, 08/26/2020   Pneumococcal Conjugate-13 06/08/2014   Pneumococcal Polysaccharide-23 01/30/2010   Td 12/21/2006   Tdap 06/11/2017   Zoster Recombinat (Shingrix) 07/15/2022   Zoster, Live 08/27/2009    TDAP status: Up to date  Flu Vaccine status: Up to date  Pneumococcal vaccine status: Up to date  Covid-19 vaccine status: Completed vaccines  Qualifies for Shingles Vaccine? Yes   Zostavax completed Yes   Shingrix Completed?: No.    Education has been provided regarding the importance of this vaccine. Patient has been advised to call insurance company to determine out of pocket expense if they have not yet received this vaccine. Advised may also receive vaccine at local pharmacy or Health Dept. Verbalized acceptance and understanding.  Screening Tests Health Maintenance  Topic Date Due   COVID-19 Vaccine (4 - 2023-24 season) 05/22/2022   Zoster Vaccines- Shingrix (2 of 2) 09/09/2022   COLONOSCOPY (Pts 45-21yr Insurance coverage will need to be confirmed)  10/06/2022   Medicare Annual Wellness (AWV)  08/12/2023   Pneumonia Vaccine 79 Years old  Completed   INFLUENZA VACCINE  Completed   Hepatitis C Screening  Completed   HPV VACCINES  Aged Out    Health Maintenance  Health Maintenance Due  Topic Date Due   COVID-19 Vaccine (4 - 2023-24 season) 05/22/2022    Colorectal cancer screening: No longer required.   Lung Cancer Screening: (Low Dose CT Chest recommended if Age 79-80years, 30 pack-year currently smoking OR have quit w/in 15years.) does not qualify.   Lung Cancer Screening Referral: no  Additional Screening:  Hepatitis C  Screening: does qualify; Completed 04/02/2020  Vision Screening: Recommended annual ophthalmology exams for early detection of glaucoma and other disorders of the eye. Is the patient up to date with their annual eye exam?  Yes  Who is the provider or what is the name of the office in which the patient attends annual eye exams? STeodoro Spray OD. If pt is not established with a provider, would they like to be referred to a provider to establish care? No .   Dental Screening: Recommended annual dental exams for proper oral hygiene  Community Resource Referral / Chronic Care Management: CRR required this visit?  No   CCM required this visit?  No      Plan:     I have personally reviewed and noted the following in the patient's chart:   Medical and social history Use of alcohol, tobacco or illicit drugs  Current medications and supplements including opioid prescriptions. Patient is not currently taking opioid prescriptions. Functional ability and status Nutritional status Physical activity Advanced directives List of other physicians Hospitalizations, surgeries, and ER visits in previous 12 months Vitals Screenings to include cognitive, depression, and falls Referrals and appointments  In addition, I have reviewed and discussed with patient certain preventive protocols, quality metrics, and best practice recommendations. A written personalized care plan for preventive services as well as general preventive health recommendations were provided to patient.     SSheral Flow LPN   [id number]  Nurse Notes: N/A

## 2022-08-11 NOTE — Patient Instructions (Signed)
Charles Lawson , Thank you for taking time to come for your Medicare Wellness Visit. I appreciate your ongoing commitment to your health goals. Please review the following plan we discussed and let me know if I can assist you in the future.   These are the goals we discussed:  Goals      Client understands the importance of follow-up with providers by attending scheduled visits        This is a list of the screening recommended for you and due dates:  Health Maintenance  Topic Date Due   COVID-19 Vaccine (4 - 2023-24 season) 05/22/2022   Zoster (Shingles) Vaccine (2 of 2) 09/09/2022   Colon Cancer Screening  10/06/2022   Medicare Annual Wellness Visit  08/12/2023   Pneumonia Vaccine  Completed   Flu Shot  Completed   Hepatitis C Screening: USPSTF Recommendation to screen - Ages 18-79 yo.  Completed   HPV Vaccine  Aged Out    Advanced directives: Yes  Conditions/risks identified: Yes  Next appointment: Follow up in one year for your annual wellness visit.   Preventive Care 64 Years and Older, Male  Preventive care refers to lifestyle choices and visits with your health care provider that can promote health and wellness. What does preventive care include? A yearly physical exam. This is also called an annual well check. Dental exams once or twice a year. Routine eye exams. Ask your health care provider how often you should have your eyes checked. Personal lifestyle choices, including: Daily care of your teeth and gums. Regular physical activity. Eating a healthy diet. Avoiding tobacco and drug use. Limiting alcohol use. Practicing safe sex. Taking low doses of aspirin every day. Taking vitamin and mineral supplements as recommended by your health care provider. What happens during an annual well check? The services and screenings done by your health care provider during your annual well check will depend on your age, overall health, lifestyle risk factors, and family history of  disease. Counseling  Your health care provider may ask you questions about your: Alcohol use. Tobacco use. Drug use. Emotional well-being. Home and relationship well-being. Sexual activity. Eating habits. History of falls. Memory and ability to understand (cognition). Work and work Statistician. Screening  You may have the following tests or measurements: Height, weight, and BMI. Blood pressure. Lipid and cholesterol levels. These may be checked every 5 years, or more frequently if you are over 42 years old. Skin check. Lung cancer screening. You may have this screening every year starting at age 30 if you have a 30-pack-year history of smoking and currently smoke or have quit within the past 15 years. Fecal occult blood test (FOBT) of the stool. You may have this test every year starting at age 39. Flexible sigmoidoscopy or colonoscopy. You may have a sigmoidoscopy every 5 years or a colonoscopy every 10 years starting at age 70. Prostate cancer screening. Recommendations will vary depending on your family history and other risks. Hepatitis C blood test. Hepatitis B blood test. Sexually transmitted disease (STD) testing. Diabetes screening. This is done by checking your blood sugar (glucose) after you have not eaten for a while (fasting). You may have this done every 1-3 years. Abdominal aortic aneurysm (AAA) screening. You may need this if you are a current or former smoker. Osteoporosis. You may be screened starting at age 53 if you are at high risk. Talk with your health care provider about your test results, treatment options, and if necessary, the need  for more tests. Vaccines  Your health care provider may recommend certain vaccines, such as: Influenza vaccine. This is recommended every year. Tetanus, diphtheria, and acellular pertussis (Tdap, Td) vaccine. You may need a Td booster every 10 years. Zoster vaccine. You may need this after age 63. Pneumococcal 13-valent  conjugate (PCV13) vaccine. One dose is recommended after age 29. Pneumococcal polysaccharide (PPSV23) vaccine. One dose is recommended after age 74. Talk to your health care provider about which screenings and vaccines you need and how often you need them. This information is not intended to replace advice given to you by your health care provider. Make sure you discuss any questions you have with your health care provider. Document Released: 10/04/2015 Document Revised: 05/27/2016 Document Reviewed: 07/09/2015 Elsevier Interactive Patient Education  2017 [employer] Prevention in the Home Falls can cause injuries. They can happen to people of all ages. There are many things you can do to make your home safe and to help prevent falls. What can I do on the outside of my home? Regularly fix the edges of walkways and driveways and fix any cracks. Remove anything that might make you trip as you walk through a door, such as a raised step or threshold. Trim any bushes or trees on the path to your home. Use bright outdoor lighting. Clear any walking paths of anything that might make someone trip, such as rocks or tools. Regularly check to see if handrails are loose or broken. Make sure that both sides of any steps have handrails. Any raised decks and porches should have guardrails on the edges. Have any leaves, snow, or ice cleared regularly. Use sand or salt on walking paths during winter. Clean up any spills in your garage right away. This includes oil or grease spills. What can I do in the bathroom? Use night lights. Install grab bars by the toilet and in the tub and shower. Do not use towel bars as grab bars. Use non-skid mats or decals in the tub or shower. If you need to sit down in the shower, use a plastic, non-slip stool. Keep the floor dry. Clean up any water that spills on the floor as soon as it happens. Remove soap buildup in the tub or shower regularly. Attach bath mats  securely with double-sided non-slip rug tape. Do not have throw rugs and other things on the floor that can make you trip. What can I do in the bedroom? Use night lights. Make sure that you have a light by your bed that is easy to reach. Do not use any sheets or blankets that are too big for your bed. They should not hang down onto the floor. Have a firm chair that has side arms. You can use this for support while you get dressed. Do not have throw rugs and other things on the floor that can make you trip. What can I do in the kitchen? Clean up any spills right away. Avoid walking on wet floors. Keep items that you use a lot in easy-to-reach places. If you need to reach something above you, use a strong step stool that has a grab bar. Keep electrical cords out of the way. Do not use floor polish or wax that makes floors slippery. If you must use wax, use non-skid floor wax. Do not have throw rugs and other things on the floor that can make you trip. What can I do with my stairs? Do not leave any items on the stairs.  Make sure that there are handrails on both sides of the stairs and use them. Fix handrails that are broken or loose. Make sure that handrails are as long as the stairways. Check any carpeting to make sure that it is firmly attached to the stairs. Fix any carpet that is loose or worn. Avoid having throw rugs at the top or bottom of the stairs. If you do have throw rugs, attach them to the floor with carpet tape. Make sure that you have a light switch at the top of the stairs and the bottom of the stairs. If you do not have them, ask someone to add them for you. What else can I do to help prevent falls? Wear shoes that: Do not have high heels. Have rubber bottoms. Are comfortable and fit you well. Are closed at the toe. Do not wear sandals. If you use a stepladder: Make sure that it is fully opened. Do not climb a closed stepladder. Make sure that both sides of the stepladder  are locked into place. Ask someone to hold it for you, if possible. Clearly mark and make sure that you can see: Any grab bars or handrails. First and last steps. Where the edge of each step is. Use tools that help you move around (mobility aids) if they are needed. These include: Canes. Walkers. Scooters. Crutches. Turn on the lights when you go into a dark area. Replace any light bulbs as soon as they burn out. Set up your furniture so you have a clear path. Avoid moving your furniture around. If any of your floors are uneven, fix them. If there are any pets around you, be aware of where they are. Review your medicines with your doctor. Some medicines can make you feel dizzy. This can increase your chance of falling. Ask your doctor what other things that you can do to help prevent falls. This information is not intended to replace advice given to you by your health care provider. Make sure you discuss any questions you have with your health care provider. Document Released: 07/04/2009 Document Revised: 02/13/2016 Document Reviewed: 10/12/2014 Elsevier Interactive Patient Education  2017 [employer].

## 2022-08-24 IMAGING — DX DG CHEST 2V
2 series · 2 of 2 positions shown · non-contrast
Comparison: 11/19/2008

CLINICAL DATA: Cough for 1 month, intermittent asthmatic bronchitis

EXAM:
CHEST - 2 VIEW

[chest lat]
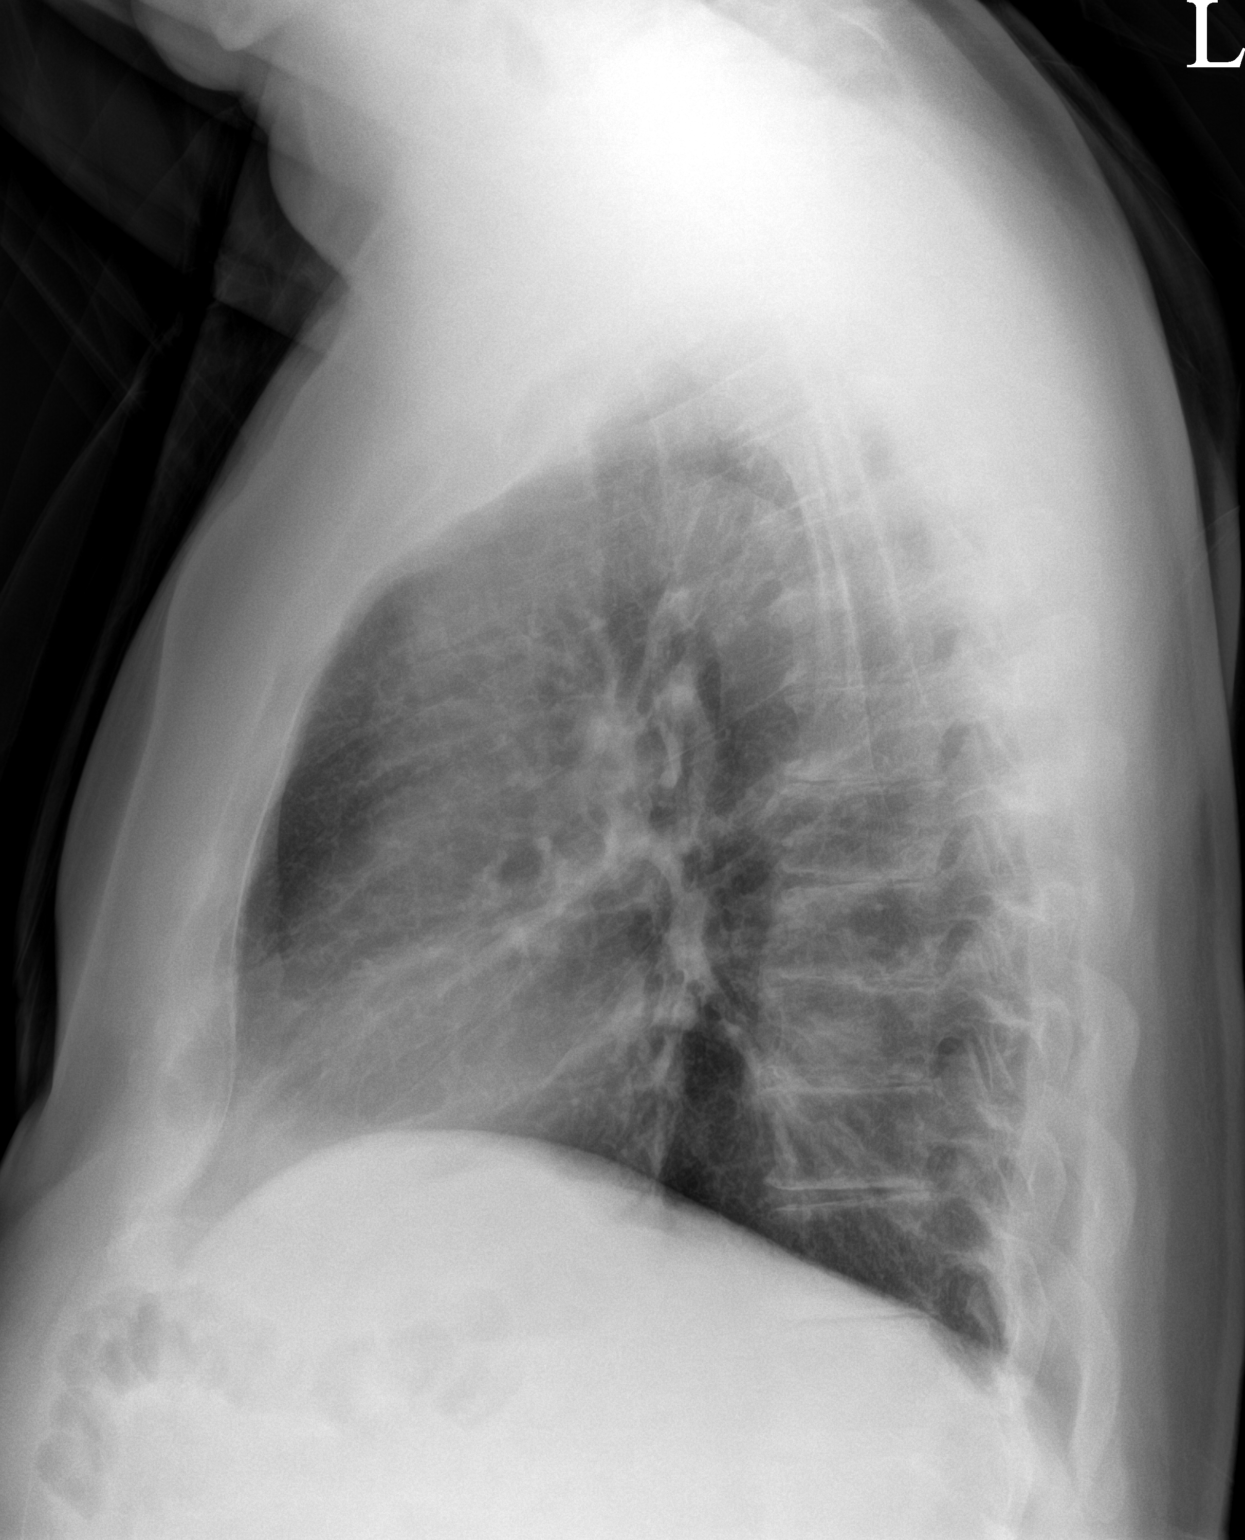

[chest pa]
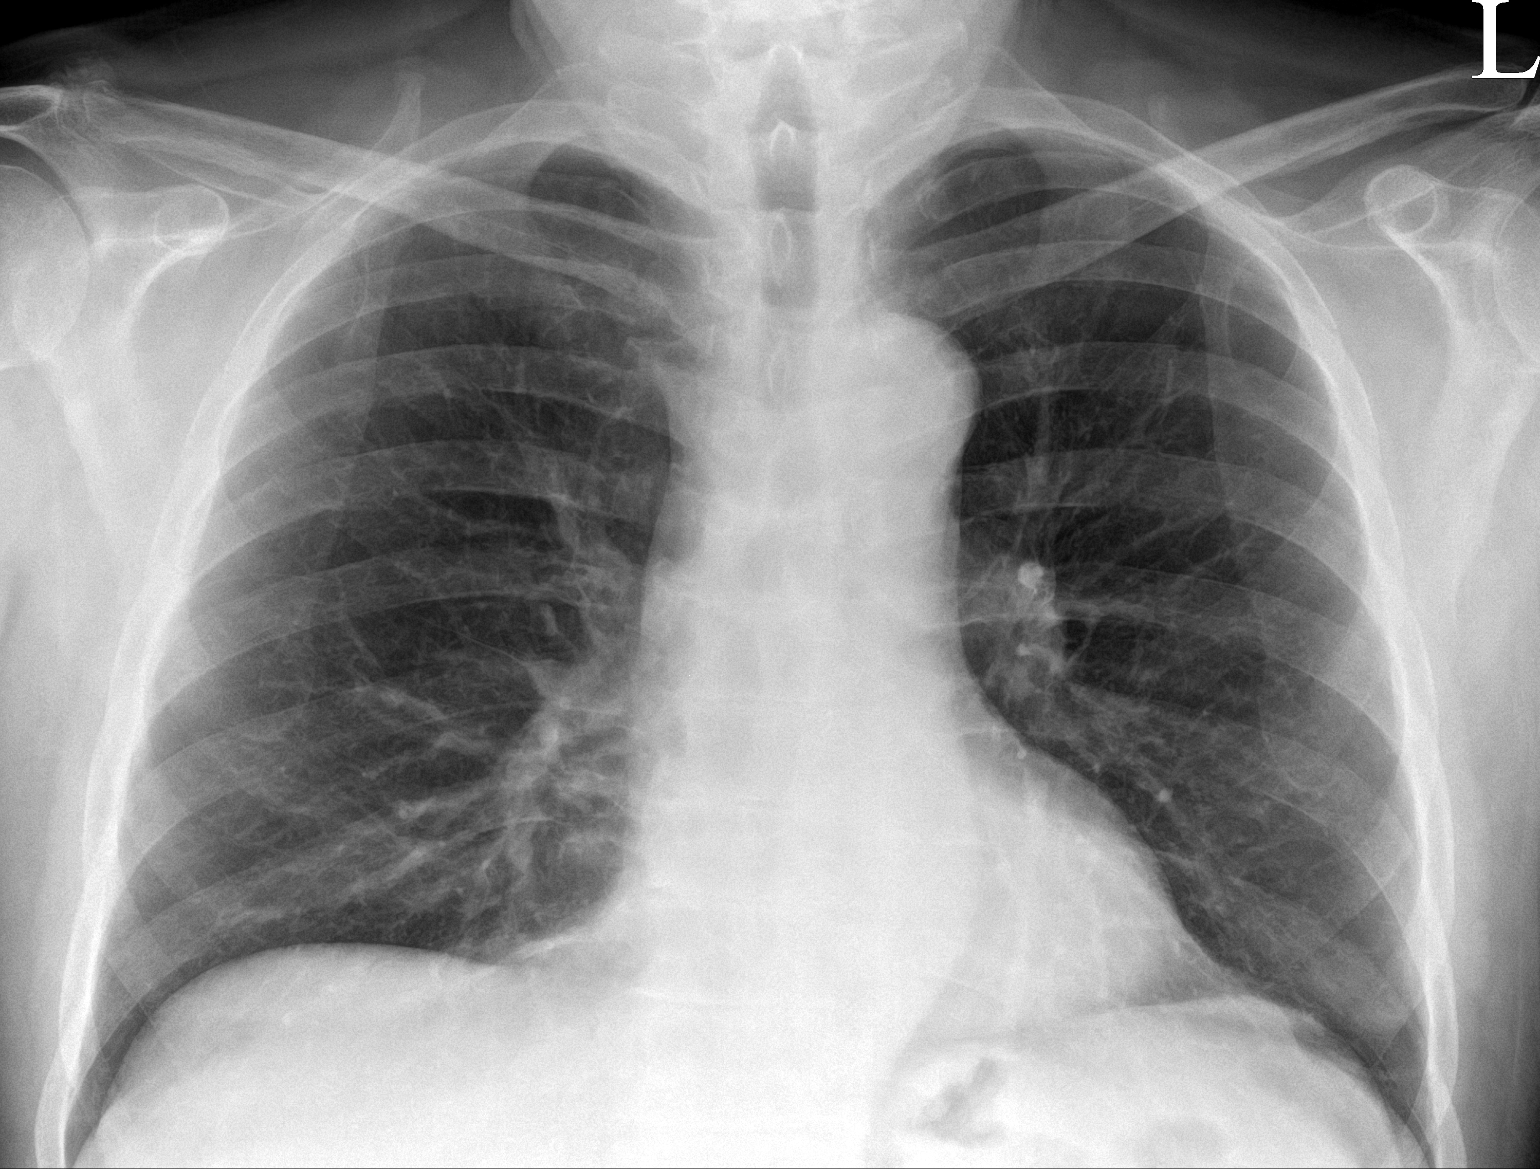

[2 of 2 positions shown; findings below may reference images not displayed]

FINDINGS: Normal heart size, mediastinal contours, and pulmonary vascularity.

Lungs clear.

No pulmonary infiltrate, pleural effusion, or pneumothorax.

Osseous structures unremarkable.
IMPRESSION: No acute abnormalities.

## 2022-08-28 ENCOUNTER — Telehealth: Payer: Medicare Other | Admitting: Family Medicine

## 2022-08-28 DIAGNOSIS — U071 COVID-19: Secondary | ICD-10-CM

## 2022-08-28 MED ORDER — NIRMATRELVIR/RITONAVIR (PAXLOVID)TABLET: 3.0000 | ORAL_TABLET | Freq: Two times a day (BID) | ORAL | 0 refills | Status: AC

## 2022-08-28 NOTE — Patient Instructions (Addendum)
Geanie Kenning, thank you for joining Perlie Mayo, NP for today's virtual visit.  While this provider Charles not your primary care provider (PCP), if your PCP Charles located in our provider database this encounter information will be shared with them immediately following your visit.   [facility] account gives you access to today's visit and all your visits, tests, and labs performed at [facility] " click here if you don't have a [facility] account or go to [email or web address]  Consent: (Patient) ISIAAH Lawson provided verbal consent for this virtual visit at the beginning of the encounter.  Current Medications:  Current Outpatient Medications:    nirmatrelvir/ritonavir EUA (PAXLOVID) 20 x 150 MG & 10 x '100MG'$  TABS, Take 3 tablets by mouth 2 (two) times daily for 5 days. (Take nirmatrelvir 150 mg two tablets twice daily for 5 days and ritonavir 100 mg one tablet twice daily for 5 days) Patient GFR Charles 66, Disp: 30 tablet, Rfl: 0   Aspirin-Acetaminophen-Caffeine (GOODY HEADACHE PO), Take by mouth as needed., Disp: , Rfl:    Cholecalciferol 50 MCG (2000 UT) TABS, 1 tab by mouth once daily, Disp: 30 tablet, Rfl: 99   HYDROcodone bit-homatropine (HYCODAN) 5-1.5 MG/5ML syrup, Take 5 mLs by mouth every 8 (eight) hours as needed for cough. (Patient not taking: Reported on 08/11/2022), Disp: 240 mL, Rfl: 0   tadalafil (CIALIS) 20 MG tablet, Take 0.5-1 tablets (10-20 mg total) by mouth every other day as needed for erectile dysfunction., Disp: 10 tablet, Rfl: 11   Medications ordered in this encounter:  Meds ordered this encounter  Medications   nirmatrelvir/ritonavir EUA (PAXLOVID) 20 x 150 MG & 10 x '100MG'$  TABS    Sig: Take 3 tablets by mouth 2 (two) times daily for 5 days. (Take nirmatrelvir 150 mg two tablets twice daily for 5 days and ritonavir 100 mg one tablet twice daily for 5 days) Patient GFR Charles 66    Dispense:  30 tablet    Refill:  0    Order Specific Question:    Supervising Provider    Answer:   Chase Picket [id number]     *If you need refills on other medications prior to your next appointment, please contact your pharmacy*  Follow-Up: Call back or seek an in-person evaluation if the symptoms worsen or if the condition fails to improve as anticipated.  [place] [phone]  Other Instructions  Please keep well-hydrated and get plenty of rest. Start a saline nasal rinse to flush out your nasal passages. You can use plain Mucinex to help thin congestion. If you have a humidifier, running in the bedroom at night. I want you to start OTC vitamin D3 1000 units daily, vitamin C 1000 mg daily, and a zinc supplement. Please take prescribed medications as directed.  You have been enrolled in a MyChart symptom monitoring program. Please answer these questions daily so we can keep track of how you are doing.  You were to quarantine for 5 days from onset of your symptoms.  After day 5, if you have had no fever and you are feeling better, you can end quarantine but need to mask for an additional 5 days. After day 5 if you have a fever or are having significant symptoms, please quarantine for full 10 days.  If you note any worsening of symptoms, any significant shortness of breath or any chest pain, please seek ER evaluation ASAP.  Please do not delay  care!  COVID-19: What to Do if You Are Sick If you test positive and are an older adult or someone who Charles at high risk of getting very sick from COVID-19, treatment may be available. Contact a healthcare provider right away after a positive test to determine if you are eligible, even if your symptoms are mild right now. You can also visit a Test to Treat location and, if eligible, receive a prescription from a provider. Don't delay: Treatment must be started within the first few days to be effective. If you have a fever, cough, or other symptoms, you might have COVID-19. Most people  have mild illness and are able to recover at home. If you are sick: Keep track of your symptoms. If you have an emergency warning sign (including trouble breathing), call 911. Steps to help prevent the spread of COVID-19 if you are sick If you are sick with COVID-19 or think you might have COVID-19, follow the steps below to care for yourself and to help protect other people in your home and community. Stay home except to get medical care Stay home. Most people with COVID-19 have mild illness and can recover at home without medical care. Do not leave your home, except to get medical care. Do not visit public areas and do not go to places where you are unable to wear a mask. Take care of yourself. Get rest and stay hydrated. Take over-the-counter medicines, such as acetaminophen, to help you feel better. Stay in touch with your doctor. Call before you get medical care. Be sure to get care if you have trouble breathing, or have any other emergency warning signs, or if you think it Charles an emergency. Avoid public transportation, ride-sharing, or taxis if possible. Get tested If you have symptoms of COVID-19, get tested. While waiting for test results, stay away from others, including staying apart from those living in your household. Get tested as soon as possible after your symptoms start. Treatments may be available for people with COVID-19 who are at risk for becoming very sick. Don't delay: Treatment must be started early to be effective--some treatments must begin within 5 days of your first symptoms. Contact your healthcare provider right away if your test result Charles positive to determine if you are eligible. Self-tests are one of several options for testing for the virus that causes COVID-19 and may be more convenient than laboratory-based tests and point-of-care tests. Ask your healthcare provider or your local health department if you need help interpreting your test results. You can visit your  state, tribal, local, and territorial health department's website to look for the latest local information on testing sites. Separate yourself from other people As much as possible, stay in a specific room and away from other people and pets in your home. If possible, you should use a separate bathroom. If you need to be around other people or animals in or outside of the home, wear a well-fitting mask. Tell your close contacts that they may have been exposed to COVID-19. An infected person can spread COVID-19 starting 48 hours (or 2 days) before the person has any symptoms or tests positive. By letting your close contacts know they may have been exposed to COVID-19, you are helping to protect everyone. See COVID-19 and Animals if you have questions about pets. If you are diagnosed with COVID-19, someone from the health department may call you. Answer the call to slow the spread. Monitor your symptoms Symptoms of COVID-19 include fever,  cough, or other symptoms. Follow care instructions from your healthcare provider and local health department. Your local health authorities may give instructions on checking your symptoms and reporting information. When to seek emergency medical attention Look for emergency warning signs* for COVID-19. If someone Charles showing any of these signs, seek emergency medical care immediately: Trouble breathing Persistent pain or pressure in the chest New confusion Inability to wake or stay awake Pale, gray, or blue-colored skin, lips, or nail beds, depending on skin tone *This list Charles not all possible symptoms. Please call your medical provider for any other symptoms that are severe or concerning to you. Call 911 or call ahead to your local emergency facility: Notify the operator that you are seeking care for someone who has or may have COVID-19. Call ahead before visiting your doctor Call ahead. Many medical visits for routine care are being postponed or done by phone or  telemedicine. If you have a medical appointment that cannot be postponed, call your doctor's office, and tell them you have or may have COVID-19. This will help the office protect themselves and other patients. If you are sick, wear a well-fitting mask You should wear a mask if you must be around other people or animals, including pets (even at home). Wear a mask with the best fit, protection, and comfort for you. You don't need to wear the mask if you are alone. If you can't put on a mask (because of trouble breathing, for example), cover your coughs and sneezes in some other way. Try to stay at least 6 feet away from other people. This will help protect the people around you. Masks should not be placed on young children under age 87 years, anyone who has trouble breathing, or anyone who Charles not able to remove the mask without help. Cover your coughs and sneezes Cover your mouth and nose with a tissue when you cough or sneeze. Throw away used tissues in a lined trash can. Immediately wash your hands with soap and water for at least 20 seconds. If soap and water are not available, clean your hands with an alcohol-based hand sanitizer that contains at least 60% alcohol. Clean your hands often Wash your hands often with soap and water for at least 20 seconds. This Charles especially important after blowing your nose, coughing, or sneezing; going to the bathroom; and before eating or preparing food. Use hand sanitizer if soap and water are not available. Use an alcohol-based hand sanitizer with at least 60% alcohol, covering all surfaces of your hands and rubbing them together until they feel dry. Soap and water are the best option, especially if hands are visibly dirty. Avoid touching your eyes, nose, and mouth with unwashed hands. Handwashing Tips Avoid sharing personal household items Do not share dishes, drinking glasses, cups, eating utensils, towels, or bedding with other people in your home. Wash  these items thoroughly after using them with soap and water or put in the dishwasher. Clean surfaces in your home regularly Clean and disinfect high-touch surfaces (for example, doorknobs, tables, handles, light switches, and countertops) in your "sick room" and bathroom. In shared spaces, you should clean and disinfect surfaces and items after each use by the person who Charles ill. If you are sick and cannot clean, a caregiver or other person should only clean and disinfect the area around you (such as your bedroom and bathroom) on an as needed basis. Your caregiver/other person should wait as long as possible (at least several hours)  and wear a mask before entering, cleaning, and disinfecting shared spaces that you use. Clean and disinfect areas that may have blood, stool, or body fluids on them. Use household cleaners and disinfectants. Clean visible dirty surfaces with household cleaners containing soap or detergent. Then, use a household disinfectant. Use a product from [employer] List N: Disinfectants for Coronavirus (MWUXL-24). Be sure to follow the instructions on the label to ensure safe and effective use of the product. Many products recommend keeping the surface wet with a disinfectant for a certain period of time (look at "contact time" on the product label). You may also need to wear personal protective equipment, such as gloves, depending on the directions on the product label. Immediately after disinfecting, wash your hands with soap and water for 20 seconds. For completed guidance on cleaning and disinfecting your home, visit Complete Disinfection Guidance. Take steps to improve ventilation at home Improve ventilation (air flow) at home to help prevent from spreading COVID-19 to other people in your household. Clear out COVID-19 virus particles in the air by opening windows, using air filters, and turning on fans in your home. Use this interactive tool to learn how to improve air flow in your  home. When you can be around others after being sick with COVID-19 Deciding when you can be around others Charles different for different situations. Find out when you can safely end home isolation. For any additional questions about your care, contact your healthcare provider or state or local health department. 12/10/2020 Content source: [facility] ([facility]), Division of Viral Diseases This information Charles not intended to replace advice given to you by your health care provider. Make sure you discuss any questions you have with your health care provider. Document Revised: 01/23/2021 Document Reviewed: 01/23/2021 Elsevier Patient Education  2022 [employer].      If you have been instructed to have an in-person evaluation today at a local Urgent Care facility, please use the link below. It will take you to a list of all of our available [facility], including address, phone number and hours of operation. Please do not delay care.  [facility]  If you or a family member do not have a primary care provider, use the link below to schedule a visit and establish care. When you choose a [facility] primary care physician or advanced practice provider, you gain a long-term partner in health. Find a Primary Care Provider  Learn more about [facility]'s in-office and virtual care options: [facility]

## 2022-08-28 NOTE — Progress Notes (Signed)
Virtual Visit Consent   Charles Lawson, you are scheduled for a virtual visit with a [facility] provider today. Just as with appointments in the office, your consent must be obtained to participate. Your consent will be active for this visit and any virtual visit you may have with one of our providers in the next 365 days. If you have a MyChart account, a copy of this consent can be sent to you electronically.  As this is a virtual visit, video technology does not allow for your provider to perform a traditional examination. This may limit your provider's ability to fully assess your condition. If your provider identifies any concerns that need to be evaluated in person or the need to arrange testing (such as labs, EKG, etc.), we will make arrangements to do so. Although advances in technology are sophisticated, we cannot ensure that it will always work on either your end or our end. If the connection with a video visit is poor, the visit may have to be switched to a telephone visit. With either a video or telephone visit, we are not always able to ensure that we have a secure connection.  By engaging in this virtual visit, you consent to the provision of healthcare and authorize for your insurance to be billed (if applicable) for the services provided during this visit. Depending on your insurance coverage, you may receive a charge related to this service.  I need to obtain your verbal consent now. Are you willing to proceed with your visit today? Charles Lawson has provided verbal consent on 08/28/2022 for a virtual visit (video or telephone). Perlie Mayo, NP  Date: 08/28/2022 1:52 PM  Virtual Visit via Video Note   I, Perlie Mayo, connected with  Charles Lawson  ([id number], 79/14/1942) on 08/28/22 at  2:45 PM EST by a video-enabled telemedicine application and verified that I am speaking with the correct person using two identifiers.  Location: Patient: Virtual Visit Location Patient:  Home Provider: Virtual Visit Location Provider: Home Office   I discussed the limitations of evaluation and management by telemedicine and the availability of in person appointments. The patient expressed understanding and agreed to proceed.    History of Present Illness: Charles Lawson is a 81 y.o. who identifies as a male who was assigned male at birth, and is being seen today for COVID + test today. Reports having chills, feverish, nasal congestion, sore throat. Denies chest pain, cough, shortness of breath, ear pain.    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   Impacted ear wax 10/09/2021   Vitamin D deficiency 06/23/2021   Sinus infection 07/29/2020   Hammertoe of left foot    Tailor's bunion of left foot    Deformity of metatarsal bone of left foot    Injury of plantar plate of left foot    Hallux interphalangeus, acquired, left    Blood pressure elevated without history of HTN 04/02/2020   Ingrown nail 06/16/2019   Acute conjunctivitis of right eye 12/13/2018   Acute pharyngitis 12/13/2018   Dizziness 06/14/2018   Left inguinal hernia 06/11/2017   Mass of left side of neck 06/11/2017   Rash and nonspecific skin eruption 05/25/2014   Impaired glucose tolerance 04/03/2013   Hives 03/10/2013   Personal history of colonic polyps-adenomas 06/07/2012   Fatigue 03/30/2012   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   Allergic rhinitis 12/29/2011   Preventative health care  12/26/2011   Arthritis    Hyperlipidemia 01/30/2010   BENIGN PROSTATIC HYPERTROPHY, MILD, 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 (Patient not taking: Reported on 08/11/2022), Disp: 240 mL, Rfl: 0   tadalafil (CIALIS) 20 MG tablet, Take 0.5-1 tablets (10-20 mg total) by mouth every other day as needed for erectile dysfunction., Disp: 10 tablet, Rfl: 11  Observations/Objective: Patient is well-developed, well-nourished in no acute distress.  Resting comfortably  at home.  Head is normocephalic, atraumatic.  No labored breathing.  Speech is clear and coherent with logical content.  Patient is alert and oriented at baseline.  Nasal congestion  Assessment and Plan:  1. COVID-19  - nirmatrelvir/ritonavir EUA (PAXLOVID) 20 x 150 MG & 10 x '100MG'$  TABS; Take 3 tablets by mouth 2 (two) times daily for 5 days. (Take nirmatrelvir 150 mg two tablets twice daily for 5 days and ritonavir 100 mg one tablet twice daily for 5 days) Patient GFR is 66  Dispense: 30 tablet; Refill: 0  -rest -hydrate -take medication as ordered -covid info reviewed and on AVS   Reviewed side effects, risks and benefits of medication.    Patient acknowledged agreement and understanding of the plan.   Past Medical, Surgical, Social History, Allergies, and Medications have been Reviewed.   Follow Up Instructions: I discussed the assessment and treatment plan with the patient. The patient was provided an opportunity to ask questions and all were answered. The patient agreed with the plan and demonstrated an understanding of the instructions.  A copy of instructions were sent to the patient via MyChart unless otherwise noted below.    The patient was advised to call back or seek an in-person evaluation if the symptoms worsen or if the condition fails to improve as anticipated.  Time:  I spent 10 minutes with the patient via telehealth technology discussing the above problems/concerns.    Perlie Mayo, NP

## 2022-12-24 ENCOUNTER — Ambulatory Visit: Payer: Medicare Other | Admitting: Internal Medicine

## 2023-04-22 DIAGNOSIS — C801 Malignant (primary) neoplasm, unspecified: Secondary | ICD-10-CM

## 2023-04-22 DIAGNOSIS — B9681 Helicobacter pylori [H. pylori] as the cause of diseases classified elsewhere: Secondary | ICD-10-CM

## 2023-04-22 HISTORY — DX: Helicobacter pylori (H. pylori) as the cause of diseases classified elsewhere: B96.81

## 2023-04-22 HISTORY — DX: Malignant (primary) neoplasm, unspecified: C80.1

## 2023-04-30 ENCOUNTER — Ambulatory Visit (INDEPENDENT_AMBULATORY_CARE_PROVIDER_SITE_OTHER): Payer: Medicare Other | Admitting: Internal Medicine

## 2023-04-30 ENCOUNTER — Encounter: Payer: Self-pay | Admitting: Internal Medicine

## 2023-04-30 VITALS — BP 140/80 | HR 80 | Temp 98.4°F | Wt 200.0 lb

## 2023-04-30 DIAGNOSIS — R1013 Epigastric pain: Secondary | ICD-10-CM | POA: Insufficient documentation

## 2023-04-30 LAB — COMPREHENSIVE METABOLIC PANEL
ALT: 33 U/L (ref 0–53)
AST: 26 U/L (ref 0–37)
Albumin: 4.5 g/dL (ref 3.5–5.2)
Alkaline Phosphatase: 49 U/L (ref 39–117)
BUN: 14 mg/dL (ref 6–23)
CO2: 25 mEq/L (ref 19–32)
Calcium: 9.8 mg/dL (ref 8.4–10.5)
Chloride: 102 mEq/L (ref 96–112)
Creatinine, Ser: 1.06 mg/dL (ref 0.40–1.50)
GFR: 66.62 mL/min (ref >60.00)
Glucose, Bld: 100 mg/dL — ABNORMAL HIGH (ref 70–99)
Potassium: 4.1 mEq/L (ref 3.5–5.1)
Sodium: 135 mEq/L (ref 135–145)
Total Bilirubin: 0.8 mg/dL (ref 0.2–1.2)
Total Protein: 7.3 g/dL (ref 6.0–8.3)

## 2023-04-30 LAB — CBC
HCT: 49 % (ref 39.0–52.0)
Hemoglobin: 16.4 g/dL (ref 13.0–17.0)
MCHC: 33.5 g/dL (ref 30.0–36.0)
MCV: 88.3 fl (ref 78.0–100.0)
Platelets: 203 10*3/uL (ref 150.0–400.0)
RBC: 5.55 Mil/uL (ref 4.22–5.81)
RDW: 13.2 % (ref 11.5–15.5)
WBC: 9.3 10*3/uL (ref 4.0–10.5)

## 2023-04-30 LAB — LIPASE: Lipase: 46 U/L (ref 11.0–59.0)

## 2023-04-30 MED ORDER — PANTOPRAZOLE SODIUM 40 MG PO TBEC: 40.0000 mg | DELAYED_RELEASE_TABLET | Freq: Every day | ORAL | 0 refills | Status: DC

## 2023-04-30 MED ORDER — ONDANSETRON HCL 4 MG PO TABS: 4.0000 mg | ORAL_TABLET | Freq: Three times a day (TID) | ORAL | 0 refills | Status: DC | PRN

## 2023-04-30 NOTE — Patient Instructions (Signed)
We will check the labs and have sent in an acid medicine protonix to take 1 pill daily before breakfast. If not improving in 2-3 days let us know.  We have also sent in ondansetron (zofran) for nausea if needed.

## 2023-04-30 NOTE — Assessment & Plan Note (Signed)
Checking CBC, CMP, lipase to rule out infection, anemia, pancreatitis, renal or hepatic impairment. 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 Rx protonix 40 mg daily and zofran 4 mg TID prn. If no improvement in 2-3 days will get [place] RUQ to assess liver/gallbladder.

## 2023-04-30 NOTE — Progress Notes (Signed)
   Subjective:   Patient ID: Charles Lawson, male    DOB: 09-29-1940, 82 y.o.   MRN: [id number]  HPI The patient is a 81 YO man coming in for stomach problems. Started about 2 weeks ago with epigastric pain and belching and rare diarrhea. Appetite okay but triggers symptoms.   Review of Systems  Constitutional:  Positive for appetite change.  HENT: Negative.    Eyes: Negative.   Respiratory:  Negative for cough, chest tightness and shortness of breath.   Cardiovascular:  Negative for chest pain, palpitations and leg swelling.  Gastrointestinal:  Positive for abdominal pain, nausea and vomiting. Negative for abdominal distention, constipation and diarrhea.  Musculoskeletal: Negative.   Skin: Negative.   Neurological: Negative.   Psychiatric/Behavioral: Negative.      Objective:  Physical Exam Constitutional:      Appearance: He is well-developed.  HENT:     Head: Normocephalic and atraumatic.  Cardiovascular:     Rate and Rhythm: Normal rate and regular rhythm.  Pulmonary:     Effort: Pulmonary effort is normal. No respiratory distress.     Breath sounds: Normal breath sounds. No wheezing or rales.  Abdominal:     General: Bowel sounds are normal. There is no distension.     Palpations: Abdomen is soft.     Tenderness: There is abdominal tenderness. There is no rebound.     Comments: Epigastric pain  Musculoskeletal:     Cervical back: Normal range of motion.  Skin:    General: Skin is warm and dry.  Neurological:     Mental Status: He is alert and oriented to person, place, and time.     Coordination: Coordination normal.     Vitals:   04/30/23 1515 04/30/23 1517  BP: (!) 140/80 (!) 140/80  Pulse: 80   Temp: 98.4 F (36.9 C)   TempSrc: Oral   SpO2: 97%   Weight: 200 lb (90.7 kg)     Assessment & Plan:

## 2023-05-03 ENCOUNTER — Telehealth: Payer: Self-pay | Admitting: Internal Medicine

## 2023-05-03 DIAGNOSIS — R1013 Epigastric pain: Secondary | ICD-10-CM

## 2023-05-03 NOTE — Telephone Encounter (Signed)
Patient was seen on Friday 04/30/2023 - the medication is not helping and patient wants to be scheduled for the sonogram that was discussed.  Please call patient when this is done.  [phone]

## 2023-05-03 NOTE — Telephone Encounter (Signed)
Ok this is ordered, thanks

## 2023-05-06 ENCOUNTER — Ambulatory Visit
Admission: RE | Admit: 2023-05-06 | Discharge: 2023-05-06 | Disposition: A | Discharge status: Home / Self Care | Payer: Medicare Other | Source: Ambulatory Visit | Attending: Internal Medicine | Admitting: Internal Medicine

## 2023-05-06 ENCOUNTER — Encounter: Payer: Self-pay | Admitting: Internal Medicine

## 2023-05-06 DIAGNOSIS — R140 Abdominal distension (gaseous): Secondary | ICD-10-CM

## 2023-05-06 DIAGNOSIS — R1013 Epigastric pain: Secondary | ICD-10-CM

## 2023-05-08 ENCOUNTER — Other Ambulatory Visit: Payer: Self-pay

## 2023-05-08 ENCOUNTER — Inpatient Hospital Stay (HOSPITAL_BASED_OUTPATIENT_CLINIC_OR_DEPARTMENT_OTHER)
Admission: EM | Admit: 2023-05-08 | Discharge: 2023-05-14 | DRG: 392 | Disposition: A | Discharge status: Other Acute Inpatient Hospital | Payer: Medicare Other | Attending: Internal Medicine | Admitting: Internal Medicine

## 2023-05-08 ENCOUNTER — Encounter (HOSPITAL_BASED_OUTPATIENT_CLINIC_OR_DEPARTMENT_OTHER): Payer: Self-pay

## 2023-05-08 ENCOUNTER — Emergency Department (HOSPITAL_BASED_OUTPATIENT_CLINIC_OR_DEPARTMENT_OTHER): Payer: Medicare Other

## 2023-05-08 DIAGNOSIS — C775 Secondary and unspecified malignant neoplasm of intrapelvic lymph nodes: Secondary | ICD-10-CM | POA: Diagnosis not present

## 2023-05-08 DIAGNOSIS — K311 Adult hypertrophic pyloric stenosis: Secondary | ICD-10-CM

## 2023-05-08 DIAGNOSIS — E441 Mild protein-calorie malnutrition: Secondary | ICD-10-CM | POA: Diagnosis present

## 2023-05-08 DIAGNOSIS — B9681 Helicobacter pylori [H. pylori] as the cause of diseases classified elsewhere: Secondary | ICD-10-CM | POA: Diagnosis not present

## 2023-05-08 DIAGNOSIS — K3189 Other diseases of stomach and duodenum: Secondary | ICD-10-CM | POA: Diagnosis not present

## 2023-05-08 DIAGNOSIS — K760 Fatty (change of) liver, not elsewhere classified: Secondary | ICD-10-CM | POA: Diagnosis present

## 2023-05-08 DIAGNOSIS — R140 Abdominal distension (gaseous): Secondary | ICD-10-CM | POA: Diagnosis not present

## 2023-05-08 DIAGNOSIS — Z880 Allergy status to penicillin: Secondary | ICD-10-CM

## 2023-05-08 DIAGNOSIS — Z825 Family history of asthma and other chronic lower respiratory diseases: Secondary | ICD-10-CM

## 2023-05-08 DIAGNOSIS — K2980 Duodenitis without bleeding: Principal | ICD-10-CM | POA: Diagnosis present

## 2023-05-08 DIAGNOSIS — K219 Gastro-esophageal reflux disease without esophagitis: Secondary | ICD-10-CM | POA: Diagnosis present

## 2023-05-08 DIAGNOSIS — R110 Nausea: Secondary | ICD-10-CM

## 2023-05-08 DIAGNOSIS — K2100 Gastro-esophageal reflux disease with esophagitis, without bleeding: Secondary | ICD-10-CM

## 2023-05-08 DIAGNOSIS — D696 Thrombocytopenia, unspecified: Secondary | ICD-10-CM | POA: Diagnosis present

## 2023-05-08 DIAGNOSIS — R935 Abnormal findings on diagnostic imaging of other abdominal regions, including retroperitoneum: Secondary | ICD-10-CM | POA: Diagnosis not present

## 2023-05-08 DIAGNOSIS — Z801 Family history of malignant neoplasm of trachea, bronchus and lung: Secondary | ICD-10-CM

## 2023-05-08 DIAGNOSIS — Z4682 Encounter for fitting and adjustment of non-vascular catheter: Secondary | ICD-10-CM | POA: Diagnosis not present

## 2023-05-08 DIAGNOSIS — N400 Enlarged prostate without lower urinary tract symptoms: Secondary | ICD-10-CM | POA: Diagnosis not present

## 2023-05-08 DIAGNOSIS — D132 Benign neoplasm of duodenum: Secondary | ICD-10-CM

## 2023-05-08 DIAGNOSIS — K2970 Gastritis, unspecified, without bleeding: Secondary | ICD-10-CM | POA: Diagnosis present

## 2023-05-08 DIAGNOSIS — Z6827 Body mass index (BMI) 27.0-27.9, adult: Secondary | ICD-10-CM

## 2023-05-08 DIAGNOSIS — R112 Nausea with vomiting, unspecified: Secondary | ICD-10-CM | POA: Diagnosis not present

## 2023-05-08 DIAGNOSIS — E785 Hyperlipidemia, unspecified: Secondary | ICD-10-CM | POA: Diagnosis present

## 2023-05-08 DIAGNOSIS — E440 Moderate protein-calorie malnutrition: Secondary | ICD-10-CM | POA: Insufficient documentation

## 2023-05-08 DIAGNOSIS — Z85828 Personal history of other malignant neoplasm of skin: Secondary | ICD-10-CM

## 2023-05-08 DIAGNOSIS — I4510 Unspecified right bundle-branch block: Secondary | ICD-10-CM | POA: Diagnosis not present

## 2023-05-08 DIAGNOSIS — I10 Essential (primary) hypertension: Secondary | ICD-10-CM | POA: Diagnosis present

## 2023-05-08 DIAGNOSIS — R1013 Epigastric pain: Secondary | ICD-10-CM

## 2023-05-08 DIAGNOSIS — I700 Atherosclerosis of aorta: Secondary | ICD-10-CM | POA: Diagnosis not present

## 2023-05-08 DIAGNOSIS — K2950 Unspecified chronic gastritis without bleeding: Secondary | ICD-10-CM | POA: Diagnosis not present

## 2023-05-08 DIAGNOSIS — C259 Malignant neoplasm of pancreas, unspecified: Secondary | ICD-10-CM | POA: Diagnosis not present

## 2023-05-08 DIAGNOSIS — K317 Polyp of stomach and duodenum: Secondary | ICD-10-CM | POA: Diagnosis not present

## 2023-05-08 DIAGNOSIS — Z79899 Other long term (current) drug therapy: Secondary | ICD-10-CM | POA: Diagnosis not present

## 2023-05-08 DIAGNOSIS — K315 Obstruction of duodenum: Secondary | ICD-10-CM | POA: Diagnosis present

## 2023-05-08 DIAGNOSIS — C7989 Secondary malignant neoplasm of other specified sites: Secondary | ICD-10-CM | POA: Diagnosis not present

## 2023-05-08 DIAGNOSIS — K56609 Unspecified intestinal obstruction, unspecified as to partial versus complete obstruction: Secondary | ICD-10-CM | POA: Diagnosis not present

## 2023-05-08 DIAGNOSIS — K5730 Diverticulosis of large intestine without perforation or abscess without bleeding: Secondary | ICD-10-CM | POA: Diagnosis not present

## 2023-05-08 DIAGNOSIS — K4090 Unilateral inguinal hernia, without obstruction or gangrene, not specified as recurrent: Secondary | ICD-10-CM | POA: Diagnosis not present

## 2023-05-08 LAB — URINALYSIS, ROUTINE W REFLEX MICROSCOPIC
Glucose, UA: NEGATIVE mg/dL
Hgb urine dipstick: NEGATIVE
Ketones, ur: 40 mg/dL — AB
Leukocytes,Ua: NEGATIVE
Nitrite: NEGATIVE
Protein, ur: 30 mg/dL — AB
Specific Gravity, Urine: >=1.03 (ref 1.005–1.030)
pH: 5.5 (ref 5.0–8.0)

## 2023-05-08 LAB — COMPREHENSIVE METABOLIC PANEL
ALT: 29 U/L (ref 0–44)
AST: 21 U/L (ref 15–41)
Albumin: 3.9 g/dL (ref 3.5–5.0)
Alkaline Phosphatase: 47 U/L (ref 38–126)
Anion gap: 13 (ref 5–15)
BUN: 13 mg/dL (ref 8–23)
CO2: 22 mmol/L (ref 22–32)
Calcium: 8.9 mg/dL (ref 8.9–10.3)
Chloride: 103 mmol/L (ref 98–111)
Creatinine, Ser: 1.03 mg/dL (ref 0.61–1.24)
GFR, Estimated: >60 mL/min (ref >60)
Glucose, Bld: 94 mg/dL (ref 70–99)
Potassium: 3.6 mmol/L (ref 3.5–5.1)
Sodium: 138 mmol/L (ref 135–145)
Total Bilirubin: 1.1 mg/dL (ref 0.3–1.2)
Total Protein: 7 g/dL (ref 6.5–8.1)

## 2023-05-08 LAB — URINALYSIS, MICROSCOPIC (REFLEX)

## 2023-05-08 LAB — CBC
HCT: 50.3 % (ref 39.0–52.0)
Hemoglobin: 17.5 g/dL — ABNORMAL HIGH (ref 13.0–17.0)
MCH: 29.8 pg (ref 26.0–34.0)
MCHC: 34.8 g/dL (ref 30.0–36.0)
MCV: 85.5 fL (ref 80.0–100.0)
Platelets: 96 10*3/uL — ABNORMAL LOW (ref 150–400)
RBC: 5.88 MIL/uL — ABNORMAL HIGH (ref 4.22–5.81)
RDW: 12 % (ref 11.5–15.5)
WBC: 7.9 10*3/uL (ref 4.0–10.5)
nRBC: 0 % (ref 0.0–0.2)

## 2023-05-08 LAB — LIPASE, BLOOD: Lipase: 48 U/L (ref 11–51)

## 2023-05-08 MED ORDER — ONDANSETRON HCL 4 MG PO TABS: 4.0000 mg | ORAL_TABLET | Freq: Four times a day (QID) | ORAL | Status: DC | PRN

## 2023-05-08 MED ORDER — ACETAMINOPHEN 650 MG RE SUPP: 650.0000 mg | Freq: Four times a day (QID) | RECTAL | Status: DC | PRN

## 2023-05-08 MED ORDER — LACTATED RINGERS IV SOLN: INTRAVENOUS | Status: DC

## 2023-05-08 MED ORDER — ACETAMINOPHEN 325 MG PO TABS: 650.0000 mg | ORAL_TABLET | Freq: Four times a day (QID) | ORAL | Status: DC | PRN

## 2023-05-08 MED ORDER — ONDANSETRON HCL 4 MG/2ML IJ SOLN
4.0000 mg | Freq: Four times a day (QID) | INTRAMUSCULAR | Status: DC | PRN
  Administered 2023-05-08: 4 mg via INTRAVENOUS
  Filled 2023-05-08: qty 2

## 2023-05-08 MED ORDER — PANTOPRAZOLE SODIUM 40 MG IV SOLR
40.0000 mg | Freq: Two times a day (BID) | INTRAVENOUS | Status: DC
  Administered 2023-05-08 – 2023-05-14 (×12): 40 mg via INTRAVENOUS
  Filled 2023-05-08 (×12): qty 10

## 2023-05-08 MED ORDER — MORPHINE SULFATE (PF) 2 MG/ML IV SOLN: 2.0000 mg | INTRAVENOUS | Status: DC | PRN

## 2023-05-08 MED ORDER — IOHEXOL 300 MG/ML  SOLN
100.0000 mL | Freq: Once | INTRAMUSCULAR | Status: AC | PRN
  Administered 2023-05-08: 100 mL via INTRAVENOUS

## 2023-05-08 NOTE — Assessment & Plan Note (Signed)
Mild thrombocytopenia New onset compared to earlier this month. Maybe reactive? SCDs for DVT ppx Repeat CBC in AM to trend.

## 2023-05-08 NOTE — H&P (Signed)
History and Physical    Patient: Charles Lawson ZOX:[id number] DOB: 18-Aug-1941 DOA: 05/08/2023 DOS: the patient was seen and examined on 05/08/2023 PCP: Corwin Levins, MD  Patient coming from: Home  Chief Complaint:  Chief Complaint  Patient presents with   Abdominal Pain   HPI: Charles Lawson is a 82 y.o. male with medical history significant of HLD.  Pt in to ED with c/o epigastric pain for past 2 weeks.  Had [place] on Thurs that was neg.  N/V/D over past couple of weeks, no fever.  No obvious blood in stool or emesis.  Work up in ED suggestive of duodenitis, though mass can't be ruled out.  Pt transferred to [facility] after discussion with [facility].  Sounds like plan is for EGD as next step.  Review of Systems: As mentioned in the history of present illness. All other systems reviewed and are negative. Past Medical History:  Diagnosis Date   Allergic rhinitis, cause unspecified    Arthritis    Blood pressure elevated without history of HTN    BPH (benign prostatic hyperplasia)    Diverticulosis of colon    ED (erectile dysfunction)    History of basal cell carcinoma (BCC) excision    Hyperlipidemia    Impaired glucose tolerance 04/03/2013   Personal history of colonic polyps 06/07/2012   Past Surgical History:  Procedure Laterality Date   Quintella Reichert OSTEOTOMY Left 07/19/2020   Procedure: Quintella Reichert OSTEOTOMY;  Surgeon: Edwin Cap, DPM;  Location: [facility];  Service: Podiatry;  Laterality: Left;   BUNIONECTOMY Left 07/19/2020   Procedure: Delos Haring;  Surgeon: Edwin Cap, DPM;  Location: [facility];  Service: Podiatry;  Laterality: Left;   COLONOSCOPY  last one 10-06-2017  dr Leone Payor   [facility] TOE SURGERY Left 07/19/2020   Procedure: HAMMER TOE CORRECTION;  Surgeon: Edwin Cap, DPM;  Location: [facility];  Service: Podiatry;  Laterality: Left;   INGUINAL HERNIA REPAIR Right 11-21-2008  @ [facility]   LUMBAR LAMINECTOMY  1974    METATARSAL OSTEOTOMY Left 07/19/2020   Procedure: METATARSAL OSTEOTOMY;  Surgeon: Edwin Cap, DPM;  Location: [facility];  Service: Podiatry;  Laterality: Left;   ORIF TOE FRACTURE Left 07/19/2020   Procedure: OPEN TREATMENT OF METARSAL PHALANGEAL JOINT DISLOCATION;  Surgeon: Edwin Cap, DPM;  Location: [facility] [place];  Service: Podiatry;  Laterality: Left;  ANKLE BLOCK WITH THIGH TOURNIQUET   Social History:  reports that he has never smoked. He has never used smokeless tobacco. He reports current alcohol use. He reports that he does not use drugs.  Allergies  Allergen Reactions   Penicillins     Unknown childhood reaction    Family History  Problem Relation Age of Onset   COPD Mother    Cancer Father    Lung cancer Father    Colon cancer Neg Hx    Colon polyps Neg Hx    Rectal cancer Neg Hx    Stomach cancer Neg Hx     Prior to Admission medications   Medication Sig Start Date End Date Taking? Authorizing Provider  Aspirin-Acetaminophen-Caffeine (GOODY HEADACHE PO) Take by mouth as needed. Patient not taking: Reported on 04/30/2023    [provider]  Cholecalciferol 50 MCG (2000 UT) TABS 1 tab by mouth once daily Patient not taking: Reported on 04/30/2023 06/23/21   Corwin Levins, MD  ondansetron (ZOFRAN) 4 MG tablet Take 1 tablet (4 mg  total) by mouth every 8 (eight) hours as needed for nausea or vomiting. 04/30/23   Myrlene Broker, MD  pantoprazole (PROTONIX) 40 MG tablet Take 1 tablet (40 mg total) by mouth daily. 04/30/23   Myrlene Broker, MD    Physical Exam: Vitals:   05/08/23 1115 05/08/23 1438 05/08/23 1755 05/08/23 1922  BP: (!) 148/86 (!) 140/85 (!) 140/80 (!) 160/87  Pulse: 69 65 68 64  Resp: 20 19 17 20   Temp: 97.9 F (36.6 C) 97.9 F (36.6 C) 98 F (36.7 C) 98 F (36.7 C)  TempSrc: Oral Oral Oral Oral  SpO2: 94% 95% 97% 99%  Weight:      Height:       Constitutional: NAD, calm,  comfortable Respiratory: clear to auscultation bilaterally, no wheezing, no crackles. Normal respiratory effort. No accessory muscle use.  Cardiovascular: Regular rate and rhythm, no murmurs / rubs / gallops. No extremity edema. 2+ pedal pulses. No carotid bruits.  Abdomen: TTP, no guarding Neurologic: CN 2-12 grossly intact. Sensation intact, DTR normal. Strength 5/5 in all 4.  Psychiatric: Normal judgment and insight. Alert and oriented x 3. Normal mood.   Data Reviewed:    Labs on Admission: I have personally reviewed following labs and imaging studies  CBC: Recent Labs  Lab 05/08/23 1122  WBC 7.9  HGB 17.5*  HCT 50.3  MCV 85.5  PLT 96*   Basic Metabolic Panel: Recent Labs  Lab 05/08/23 1245  NA 138  K 3.6  CL 103  CO2 22  GLUCOSE 94  BUN 13  CREATININE 1.03  CALCIUM 8.9   GFR: Estimated Creatinine Clearance: 63.8 mL/min (by C-G formula based on SCr of 1.03 mg/dL). Liver Function Tests: Recent Labs  Lab 05/08/23 1245  AST 21  ALT 29  ALKPHOS 47  BILITOT 1.1  PROT 7.0  ALBUMIN 3.9   Recent Labs  Lab 05/08/23 1122  LIPASE 48   No results for input(s): "AMMONIA" in the last 168 hours. Coagulation Profile: No results for input(s): "INR", "PROTIME" in the last 168 hours. Cardiac Enzymes: No results for input(s): "CKTOTAL", "CKMB", "CKMBINDEX", "TROPONINI" in the last 168 hours. BNP (last 3 results) No results for input(s): "PROBNP" in the last 8760 hours. HbA1C: No results for input(s): "HGBA1C" in the last 72 hours. CBG: No results for input(s): "GLUCAP" in the last 168 hours. Lipid Profile: No results for input(s): "CHOL", "HDL", "LDLCALC", "TRIG", "CHOLHDL", "LDLDIRECT" in the last 72 hours. Thyroid Function Tests: No results for input(s): "TSH", "T4TOTAL", "FREET4", "T3FREE", "THYROIDAB" in the last 72 hours. Anemia Panel: No results for input(s): "VITAMINB12", "FOLATE", "FERRITIN", "TIBC", "IRON", "RETICCTPCT" in the last 72 hours. Urine  analysis:    Component Value Date/Time   COLORURINE YELLOW 05/08/2023 1117   APPEARANCEUR CLEAR 05/08/2023 1117   LABSPEC >=1.030 05/08/2023 1117   PHURINE 5.5 05/08/2023 1117   GLUCOSEU NEGATIVE 05/08/2023 1117   GLUCOSEU NEGATIVE 06/24/2022 1103   HGBUR NEGATIVE 05/08/2023 1117   HGBUR negative 01/30/2010 0815   BILIRUBINUR SMALL (A) 05/08/2023 1117   KETONESUR 40 (A) 05/08/2023 1117   PROTEINUR 30 (A) 05/08/2023 1117   UROBILINOGEN 0.2 06/24/2022 1103   NITRITE NEGATIVE 05/08/2023 1117   LEUKOCYTESUR NEGATIVE 05/08/2023 1117    Radiological Exams on Admission: CT ABDOMEN PELVIS W CONTRAST  Result Date: 05/08/2023 CLINICAL DATA:  Upper abdominal pain for two weeks. Patient [place] on Thursday and he stated that was negative. N/V/D over the last few weeks. No fever. EXAM: CT ABDOMEN AND  PELVIS WITH CONTRAST TECHNIQUE: Multidetector CT imaging of the abdomen and pelvis was performed using the standard protocol following bolus administration of intravenous contrast. RADIATION DOSE REDUCTION: This exam was performed according to the departmental dose-optimization program which includes automated exposure control, adjustment of the mA and/or kV according to patient size and/or use of iterative reconstruction technique. CONTRAST:  OMNIPAQUE IOHEXOL 300 MG/ML  SOLN COMPARISON:  Abdominal sonogram 05/06/2023 FINDINGS: Lower chest: Scarring noted within both lung bases and lingula. No pleural effusion or consolidative change. Hepatobiliary: No focal liver abnormality is seen. No gallstones, gallbladder wall thickening, or biliary dilatation. Pancreas: Unremarkable. No pancreatic ductal dilatation or surrounding inflammatory changes. Spleen: Normal in size without focal abnormality. Adrenals/Urinary Tract: Adrenal glands are unremarkable. Kidneys are normal, without renal calculi, focal lesion, or hydronephrosis. Bladder is unremarkable. Stomach/Bowel: There is moderate diffuse distension of the  stomach and pylorus. Abrupt caliber change with luminal narrowing is noted involving the proximal portion of the descending duodenum with mild surrounding soft tissue stranding and mucosal enhancement, image 50/5. Nonspecific. No pathologic dilatation of the large or small bowel loops. The appendix is not visualized and may be surgically absent. There are no secondary signs of acute appendicitis. Sigmoid diverticulosis without signs of acute diverticulitis. Vascular/Lymphatic: Aortic atherosclerosis. No enlarged abdominal or pelvic lymph nodes. Reproductive: Prostate gland enlargement. Other: No free fluid or fluid collections. Fat containing left inguinal hernia. Musculoskeletal: No acute or significant osseous findings. Multilevel degenerative disc disease. No acute or suspicious osseous findings. IMPRESSION: 1. There is moderate diffuse distension of the stomach and pylorus. Abrupt caliber change with luminal narrowing is noted involving the proximal portion of the descending duodenum with mild surrounding soft tissue stranding and mucosal enhancement. Findings are nonspecific and may represent a focal area of duodenitis. Underlying neoplasm cannot be excluded. Consider further evaluation with endoscopy. 2. Sigmoid diverticulosis without signs of acute diverticulitis. 3. Prostate gland enlargement. 4. Fat containing left inguinal hernia. 5.  Aortic Atherosclerosis (ICD10-I70.0). Electronically Signed   By: Signa Kell M.D.   On: 05/08/2023 14:56    EKG: Independently reviewed.   Assessment and Plan: * Duodenitis Possible duodenitis vs ulcer vs mass: Protonix Q12H [facility] planning EGD it sounds like: CLD until MN NPO after MN IVF Morphine if needed for pain  Thrombocytopenia (HCC) Mild thrombocytopenia New onset compared to earlier this month. Maybe reactive? SCDs for DVT ppx Repeat CBC in AM to trend.      Advance Care Planning:   Code Status: Full Code  Consults: [facility]  Family  Communication: Family at bedside  Severity of Illness: The appropriate patient status for this patient is INPATIENT. Inpatient status is judged to be reasonable and necessary in order to provide the required intensity of service to ensure the patient's safety. The patient's presenting symptoms, physical exam findings, and initial radiographic and laboratory data in the context of their chronic comorbidities is felt to place them at high risk for further clinical deterioration. Furthermore, it is not anticipated that the patient will be medically stable for discharge from the hospital within 2 midnights of admission.   * I certify that at the point of admission it is my clinical judgment that the patient will require inpatient hospital care spanning beyond 2 midnights from the point of admission due to high intensity of service, high risk for further deterioration and high frequency of surveillance required.*  Author: Hillary Bow., DO 05/08/2023 8:35 PM  For on call review [email or web address].

## 2023-05-08 NOTE — Discharge Instructions (Signed)
You were seen for your abdominal pain in the emergency department.  It is likely from reflux.  At home, please continue taking your Protonix.  Try to eat small meals and stay seated or upright for several hours after you eat dinner.  Check your MyChart online for the results of any tests that had not resulted by the time you left the emergency department.   Follow-up with your primary doctor in 2-3 days regarding your visit.  Follow-up with the gastroenterologist as soon as possible.  Return immediately to the emergency department if you experience any of the following: Worsening pain, vomiting, or any other concerning symptoms.    Thank you for visiting our Emergency Department. It was a pleasure taking care of you today.

## 2023-05-08 NOTE — ED Notes (Signed)
[facility] on site to transport patient to [facility] 

## 2023-05-08 NOTE — ED Provider Notes (Signed)
   ED Course / MDM   Clinical Course as of 05/08/23 1645  Sat May 08, 2023  1518 Received sign out pending GI evaluation, having decreased PO intake and focal duodenitis with possible mass. Needs endoscopy [WS]  1538 Signed out to Dr Suezanne Jacquet [RP]  1620 Discussed with Amy Esterwood with [facility].  Agrees that patient should be admitted for further management. CLD ok until NPO at midnight.  No hospital preference.  Patient is amenable to admission.  [WS]  1636 Discussed with the hospitalist who will place bed request. [WS]    Clinical Course User Index [RP] Rondel Baton, MD [WS] Lonell Grandchild, MD   Medical Decision Making Amount and/or Complexity of Data Reviewed Labs: ordered. Radiology: ordered.  Risk Prescription drug management. Decision regarding hospitalization.   1. Gastroesophageal reflux disease with esophagitis, unspecified whether hemorrhage  2. Epigastric pain  3. Duodenitis         Lonell Grandchild, MD 05/08/23 [phone]

## 2023-05-08 NOTE — ED Notes (Signed)
ED TO INPATIENT HANDOFF REPORT  ED Nurse Name and Phone #: Dominga F[place], [place] Paramedic [phone]  S Name/Age/Gender Charles Lawson 82 y.o. male Room/Bed: MH06/MH06  Code Status   Code Status: Not on file  Home/SNF/Other Home Patient oriented to: self, place, time, and situation Is this baseline? Yes   Triage Complete: Triage complete  Chief Complaint Duodenitis [K29.80]  Triage Note The patient has been having upper ABD pain for two weeks. He has a [place] on Thursday and he stated that was negative. N/V/D over the last few weeks. No fever.    Allergies Allergies  Allergen Reactions   Penicillins     Unknown childhood reaction    Level of Care/Admitting Diagnosis ED Disposition     ED Disposition  Admit   Condition  --   Comment  [facility] [[id number]]  Level of Care: Med-Surg [16]  May admit patient to [facility] or [facility] if equivalent level of care is available:: Yes  Interfacility transfer: Yes  Covid Evaluation: Asymptomatic - no recent exposure (last 10 days) testing not required  Diagnosis: Duodenitis [244010]  Admitting Physician: Narda Bonds [[id number]]  Attending Physician: Lonell Grandchild [[id number]]  Certification:: I certify this patient will need inpatient services for at least 2 midnights  Expected Medical Readiness: 05/12/2023          B Medical/Surgery History Past Medical History:  Diagnosis Date   Allergic rhinitis, cause unspecified    Arthritis    Blood pressure elevated without history of HTN    BPH (benign prostatic hyperplasia)    Diverticulosis of colon    ED (erectile dysfunction)    History of basal cell carcinoma (BCC) excision    Hyperlipidemia    Impaired glucose tolerance 04/03/2013   Personal history of colonic polyps 06/07/2012   Past Surgical History:  Procedure Laterality Date   Quintella Reichert OSTEOTOMY Left 07/19/2020   Procedure: Quintella Reichert OSTEOTOMY;  Surgeon: Edwin Cap, DPM;   Location: [facility];  Service: Podiatry;  Laterality: Left;   BUNIONECTOMY Left 07/19/2020   Procedure: Delos Haring;  Surgeon: Edwin Cap, DPM;  Location: [facility];  Service: Podiatry;  Laterality: Left;   COLONOSCOPY  last one 10-06-2017  dr Leone Payor   [facility] TOE SURGERY Left 07/19/2020   Procedure: HAMMER TOE CORRECTION;  Surgeon: Edwin Cap, DPM;  Location: [facility] [place];  Service: Podiatry;  Laterality: Left;   INGUINAL HERNIA REPAIR Right 11-21-2008  @ [facility]   LUMBAR LAMINECTOMY  1974   METATARSAL OSTEOTOMY Left 07/19/2020   Procedure: METATARSAL OSTEOTOMY;  Surgeon: Edwin Cap, DPM;  Location: [facility];  Service: Podiatry;  Laterality: Left;   ORIF TOE FRACTURE Left 07/19/2020   Procedure: OPEN TREATMENT OF METARSAL PHALANGEAL JOINT DISLOCATION;  Surgeon: Edwin Cap, DPM;  Location: [facility];  Service: Podiatry;  Laterality: Left;  ANKLE BLOCK WITH THIGH TOURNIQUET     A IV Location/Drains/Wounds Patient Lines/Drains/Airways Status     Active Line/Drains/Airways     Name Placement date Placement time Site Days   Peripheral IV 05/08/23 20 G Anterior;Distal;Right;Upper Arm 05/08/23  1225  Arm  less than 1            Intake/Output Last 24 hours No intake or output data in the 24 hours ending 05/08/23 1653  Labs/Imaging Results for orders placed or performed during the hospital encounter of 05/08/23 (from the past 48 hour(s))  Urinalysis, Routine w reflex microscopic -Urine, Clean Catch     Status: Abnormal   Collection Time: 05/08/23 11:17 AM  Result Value Ref Range   Color, Urine YELLOW YELLOW   APPearance CLEAR CLEAR   Specific Gravity, Urine >=1.030 1.005 - 1.030   pH 5.5 5.0 - 8.0   Glucose, UA NEGATIVE NEGATIVE mg/dL   Hgb urine dipstick NEGATIVE NEGATIVE   Bilirubin Urine SMALL (A) NEGATIVE   Ketones, ur 40 (A) NEGATIVE mg/dL   Protein, ur 30 (A)  NEGATIVE mg/dL   Nitrite NEGATIVE NEGATIVE   Leukocytes,Ua NEGATIVE NEGATIVE    Comment: Performed at [facility], [address] [place], [place] [address]  Urinalysis, Microscopic (reflex)     Status: Abnormal   Collection Time: 05/08/23 11:17 AM  Result Value Ref Range   RBC / HPF 0-5 0 - 5 RBC/hpf   WBC, UA 0-5 0 - 5 WBC/hpf   Bacteria, UA FEW (A) NONE SEEN   Squamous Epithelial / HPF 0-5 0 - 5 /HPF   Mucus PRESENT     Comment: Performed at [facility], [address] [place], [place] [address]  Lipase, blood     Status: None   Collection Time: 05/08/23 11:22 AM  Result Value Ref Range   Lipase 48 11 - 51 U/L    Comment: Performed at [facility], [address] [place], [place] [address]  CBC     Status: Abnormal   Collection Time: 05/08/23 11:22 AM  Result Value Ref Range   WBC 7.9 4.0 - 10.5 K/uL   RBC 5.88 (H) 4.22 - 5.81 MIL/uL   Hemoglobin 17.5 (H) 13.0 - 17.0 g/dL   HCT 44.0 34.7 - 42.5 %   MCV 85.5 80.0 - 100.0 fL   MCH 29.8 26.0 - 34.0 pg   MCHC 34.8 30.0 - 36.0 g/dL   RDW 95.6 38.7 - 56.4 %   Platelets 96 (L) 150 - 400 K/uL    Comment: SPECIMEN CHECKED FOR CLOTS Immature Platelet Fraction may be clinically indicated, consider ordering this additional test [id number] REPEATED TO VERIFY PLATELET COUNT CONFIRMED BY SMEAR    nRBC 0.0 0.0 - 0.2 %    Comment: Performed at [facility], [address] [place], [place] [address]  Comprehensive metabolic panel     Status: None   Collection Time: 05/08/23 12:45 PM  Result Value Ref Range   Sodium 138 135 - 145 mmol/L   Potassium 3.6 3.5 - 5.1 mmol/L   Chloride 103 98 - 111 mmol/L   CO2 22 22 - 32 mmol/L   Glucose, Bld 94 70 - 99 mg/dL    Comment: Glucose reference range applies only to samples taken after fasting for at least 8 hours.   BUN 13 8 - 23 mg/dL   Creatinine, Ser 6.30 0.61 - 1.24 mg/dL   Calcium 8.9 8.9 - 16.0 mg/dL   Total Protein 7.0 6.5 - 8.1 g/dL    Albumin 3.9 3.5 - 5.0 g/dL   AST 21 15 - 41 U/L   ALT 29 0 - 44 U/L   Alkaline Phosphatase 47 38 - 126 U/L   Total Bilirubin 1.1 0.3 - 1.2 mg/dL   GFR, Estimated >10 >93 mL/min    Comment: (NOTE) Calculated using the CKD-EPI Creatinine Equation (2021)    Anion gap 13 5 - 15    Comment: Performed at [facility], [address] [place], [place] [address]   CT ABDOMEN PELVIS W CONTRAST  Result Date: 05/08/2023 CLINICAL DATA:  Upper abdominal pain for two weeks. Patient [place] on Thursday and he stated that was negative. N/V/D over the last few weeks. No fever. EXAM: CT ABDOMEN AND PELVIS WITH CONTRAST TECHNIQUE: Multidetector CT imaging of the abdomen and pelvis was performed using the standard protocol following bolus administration of intravenous contrast. RADIATION DOSE REDUCTION: This exam was performed according to the departmental dose-optimization program which includes automated exposure control, adjustment of the mA and/or kV according to patient size and/or use of iterative reconstruction technique. CONTRAST:  OMNIPAQUE IOHEXOL 300 MG/ML  SOLN COMPARISON:  Abdominal sonogram 05/06/2023 FINDINGS: Lower chest: Scarring noted within both lung bases and lingula. No pleural effusion or consolidative change. Hepatobiliary: No focal liver abnormality is seen. No gallstones, gallbladder wall thickening, or biliary dilatation. Pancreas: Unremarkable. No pancreatic ductal dilatation or surrounding inflammatory changes. Spleen: Normal in size without focal abnormality. Adrenals/Urinary Tract: Adrenal glands are unremarkable. Kidneys are normal, without renal calculi, focal lesion, or hydronephrosis. Bladder is unremarkable. Stomach/Bowel: There is moderate diffuse distension of the stomach and pylorus. Abrupt caliber change with luminal narrowing is noted involving the proximal portion of the descending duodenum with mild surrounding soft tissue stranding and mucosal enhancement, image 50/5.  Nonspecific. No pathologic dilatation of the large or small bowel loops. The appendix is not visualized and may be surgically absent. There are no secondary signs of acute appendicitis. Sigmoid diverticulosis without signs of acute diverticulitis. Vascular/Lymphatic: Aortic atherosclerosis. No enlarged abdominal or pelvic lymph nodes. Reproductive: Prostate gland enlargement. Other: No free fluid or fluid collections. Fat containing left inguinal hernia. Musculoskeletal: No acute or significant osseous findings. Multilevel degenerative disc disease. No acute or suspicious osseous findings. IMPRESSION: 1. There is moderate diffuse distension of the stomach and pylorus. Abrupt caliber change with luminal narrowing is noted involving the proximal portion of the descending duodenum with mild surrounding soft tissue stranding and mucosal enhancement. Findings are nonspecific and may represent a focal area of duodenitis. Underlying neoplasm cannot be excluded. Consider further evaluation with endoscopy. 2. Sigmoid diverticulosis without signs of acute diverticulitis. 3. Prostate gland enlargement. 4. Fat containing left inguinal hernia. 5.  Aortic Atherosclerosis (ICD10-I70.0). Electronically Signed   By: Signa Kell M.D.   On: 05/08/2023 14:56    Pending Labs Unresulted Labs (From admission, onward)    None       Vitals/Pain Today's Vitals   05/08/23 1115 05/08/23 1115 05/08/23 1438  BP:  (!) 148/86 (!) 140/85  Pulse:  69 65  Resp:  20 19  Temp:  97.9 F (36.6 C) 97.9 F (36.6 C)  TempSrc:  Oral Oral  SpO2:  94% 95%  Weight: 200 lb (90.7 kg)    Height: 6' (1.829 m)    PainSc: 5       Isolation Precautions No active isolations  Medications Medications  iohexol (OMNIPAQUE) 300 MG/ML solution 100 mL (100 mLs Intravenous Contrast Given 05/08/23 1420)    Mobility walks     Focused Assessments GI   R Recommendations: See Admitting Provider Note  Report given to: Katy Apo,  RN  Additional Notes:

## 2023-05-08 NOTE — ED Triage Notes (Signed)
The patient has been having upper ABD pain for two weeks. He has a [place] on Thursday and he stated that was negative. N/V/D over the last few weeks. No fever.

## 2023-05-08 NOTE — Assessment & Plan Note (Addendum)
Possible duodenitis vs ulcer vs mass: Protonix Q12H [facility] planning EGD it sounds like: CLD until MN NPO after MN IVF Morphine if needed for pain

## 2023-05-08 NOTE — ED Provider Notes (Signed)
EMERGENCY DEPARTMENT AT [facility] Provider Note   CSN: [id number] Arrival date & time: 05/08/23  1053     History  Chief Complaint  Patient presents with   Abdominal Pain    Charles Lawson is a 82 y.o. male.  82 year old male with a history of hypertension, hyperlipidemia, and BPH presents emergency department for abdominal pain for 2 weeks.  Says that he has been having epigastric abdominal discomfort that is postprandial.  Says it also gets worse when he lays down and has significant amount of belching and reflux.  Has not been able to eat hardly anything since this started.  Says that it started to become unbearable so he came into the emergency department for evaluation.  Saw his primary doctor who performed an ultrasound of his gallbladder which did not show any gallstones or signs of cholecystitis.  Says the pain is not exertional.  No heavy alcohol use.  Had abdominal hernia repair.  Last bowel movement was today and was somewhat loose.  No fevers or vomiting but has some nausea.       Home Medications Prior to Admission medications   Medication Sig Start Date End Date Taking? Authorizing Provider  Aspirin-Acetaminophen-Caffeine (GOODY HEADACHE PO) Take by mouth as needed. Patient not taking: Reported on 04/30/2023    [provider]  Cholecalciferol 50 MCG (2000 UT) TABS 1 tab by mouth once daily Patient not taking: Reported on 04/30/2023 06/23/21   Corwin Levins, MD  ondansetron (ZOFRAN) 4 MG tablet Take 1 tablet (4 mg total) by mouth every 8 (eight) hours as needed for nausea or vomiting. 04/30/23   Myrlene Broker, MD  pantoprazole (PROTONIX) 40 MG tablet Take 1 tablet (40 mg total) by mouth daily. 04/30/23   Myrlene Broker, MD      Allergies    Penicillins    Review of Systems   Review of Systems  Physical Exam Updated Vital Signs BP (!) 140/80 (BP Location: Left Arm)   Pulse 68   Temp 98 F (36.7 C) (Oral)   Resp 17   Ht  6' (1.829 m)   Wt 90.7 kg   SpO2 97%   BMI 27.12 kg/m  Physical Exam Vitals and nursing note reviewed.  Constitutional:      General: He is not in acute distress.    Appearance: He is well-developed.  HENT:     Head: Normocephalic and atraumatic.     Right Ear: External ear normal.     Left Ear: External ear normal.     Nose: Nose normal.  Eyes:     Extraocular Movements: Extraocular movements intact.     Conjunctiva/sclera: Conjunctivae normal.     Pupils: Pupils are equal, round, and reactive to light.  Cardiovascular:     Rate and Rhythm: Normal rate and regular rhythm.     Heart sounds: Normal heart sounds.  Pulmonary:     Effort: Pulmonary effort is normal. No respiratory distress.     Breath sounds: Normal breath sounds.  Abdominal:     General: There is distension.     Palpations: Abdomen is soft. There is no mass.     Tenderness: There is abdominal tenderness (Periumbilical, right lower quadrant). There is no guarding.  Musculoskeletal:     Cervical back: Normal range of motion and neck supple.  Skin:    General: Skin is warm and dry.  Neurological:     Mental Status: He is alert. Mental status  is at baseline.  Psychiatric:        Mood and Affect: Mood normal.        Behavior: Behavior normal.     ED Results / Procedures / Treatments   Labs (all labs ordered are listed, but only abnormal results are displayed) Labs Reviewed  CBC - Abnormal; Notable for the following components:      Result Value   RBC 5.88 (*)    Hemoglobin 17.5 (*)    Platelets 96 (*)    All other components within normal limits  URINALYSIS, ROUTINE W REFLEX MICROSCOPIC - Abnormal; Notable for the following components:   Bilirubin Urine SMALL (*)    Ketones, ur 40 (*)    Protein, ur 30 (*)    All other components within normal limits  URINALYSIS, MICROSCOPIC (REFLEX) - Abnormal; Notable for the following components:   Bacteria, UA FEW (*)    All other components within normal limits   LIPASE, BLOOD  COMPREHENSIVE METABOLIC PANEL    EKG EKG Interpretation Date/Time:  Saturday May 08 2023 11:19:50 EDT Ventricular Rate:  77 PR Interval:  151 QRS Duration:  143 QT Interval:  393 QTC Calculation: 445 R Axis:   -49  Text Interpretation: Sinus rhythm RBBB and LAFB Confirmed by Vonita Moss [phone]) on 05/08/2023 12:08:08 PM  Radiology CT ABDOMEN PELVIS W CONTRAST  Result Date: 05/08/2023 CLINICAL DATA:  Upper abdominal pain for two weeks. Patient [place] on Thursday and he stated that was negative. N/V/D over the last few weeks. No fever. EXAM: CT ABDOMEN AND PELVIS WITH CONTRAST TECHNIQUE: Multidetector CT imaging of the abdomen and pelvis was performed using the standard protocol following bolus administration of intravenous contrast. RADIATION DOSE REDUCTION: This exam was performed according to the departmental dose-optimization program which includes automated exposure control, adjustment of the mA and/or kV according to patient size and/or use of iterative reconstruction technique. CONTRAST:  OMNIPAQUE IOHEXOL 300 MG/ML  SOLN COMPARISON:  Abdominal sonogram 05/06/2023 FINDINGS: Lower chest: Scarring noted within both lung bases and lingula. No pleural effusion or consolidative change. Hepatobiliary: No focal liver abnormality is seen. No gallstones, gallbladder wall thickening, or biliary dilatation. Pancreas: Unremarkable. No pancreatic ductal dilatation or surrounding inflammatory changes. Spleen: Normal in size without focal abnormality. Adrenals/Urinary Tract: Adrenal glands are unremarkable. Kidneys are normal, without renal calculi, focal lesion, or hydronephrosis. Bladder is unremarkable. Stomach/Bowel: There is moderate diffuse distension of the stomach and pylorus. Abrupt caliber change with luminal narrowing is noted involving the proximal portion of the descending duodenum with mild surrounding soft tissue stranding and mucosal enhancement, image 50/5.  Nonspecific. No pathologic dilatation of the large or small bowel loops. The appendix is not visualized and may be surgically absent. There are no secondary signs of acute appendicitis. Sigmoid diverticulosis without signs of acute diverticulitis. Vascular/Lymphatic: Aortic atherosclerosis. No enlarged abdominal or pelvic lymph nodes. Reproductive: Prostate gland enlargement. Other: No free fluid or fluid collections. Fat containing left inguinal hernia. Musculoskeletal: No acute or significant osseous findings. Multilevel degenerative disc disease. No acute or suspicious osseous findings. IMPRESSION: 1. There is moderate diffuse distension of the stomach and pylorus. Abrupt caliber change with luminal narrowing is noted involving the proximal portion of the descending duodenum with mild surrounding soft tissue stranding and mucosal enhancement. Findings are nonspecific and may represent a focal area of duodenitis. Underlying neoplasm cannot be excluded. Consider further evaluation with endoscopy. 2. Sigmoid diverticulosis without signs of acute diverticulitis. 3. Prostate gland enlargement. 4. Fat containing left  inguinal hernia. 5.  Aortic Atherosclerosis (ICD10-I70.0). Electronically Signed   By: Signa Kell M.D.   On: 05/08/2023 14:56    Procedures Procedures    Medications Ordered in ED Medications  iohexol (OMNIPAQUE) 300 MG/ML solution 100 mL (100 mLs Intravenous Contrast Given 05/08/23 1420)    ED Course/ Medical Decision Making/ A&P Clinical Course as of 05/08/23 1858  Sat May 08, 2023  1518 Received sign out pending GI evaluation, having decreased PO intake and focal duodenitis with possible mass. Needs endoscopy [WS]  1538 Signed out to Dr Suezanne Jacquet [RP]  1620 Discussed with Amy Esterwood with [facility].  Agrees that patient should be admitted for further management. CLD ok until NPO at midnight.  No hospital preference.  Patient is amenable to admission.  [WS]  1636 Discussed with the  hospitalist who will place bed request. [WS]    Clinical Course User Index [RP] Rondel Baton, MD [WS] Lonell Grandchild, MD                                 Medical Decision Making Amount and/or Complexity of Data Reviewed Labs: ordered. Radiology: ordered.  Risk Prescription drug management. Decision regarding hospitalization.   NAVARRE BIELER is a 82 y.o. male with comorbidities that complicate the patient evaluation including hypertension, hyperlipidemia, and BPH presents emergency department for abdominal pain for 2 weeks.     Initial Ddx:  GERD, malignancy, bowel obstruction, ileus, pancreatitis  MDM/Course:  Patient presents to the emergency department with epigastric abdominal pain for 2 weeks and severe nausea.  Initially was concerned about GERD versus possible malignancy causing his dyspepsia and nausea.  Would expect more nausea and vomiting with a bowel obstruction.  Does not appear to be significantly constipated to be concerning for ileus.  No risk factors for pancreatitis.  Had lab work and a CT scan which did show duodenitis and radiology stated that he could possibly have a duodenal tumor.  Does also appear to have a very distended stomach.  Upon re-evaluation was still quite nauseous.  Feel the patient would benefit from admission.  I have consulted GI and are waiting to hear back from them.  Signed out to Dr. Suezanne Jacquet awaiting GI consult and final disposition.  This patient presents to the ED for concern of complaints listed in HPI, this involves an extensive number of treatment options, and is a complaint that carries with it a high risk of complications and morbidity. Disposition including potential need for admission considered.   Dispo: Pending remainder of workup  Additional history obtained from spouse Records reviewed Outpatient Clinic Notes The following labs were independently interpreted: Chemistry and show no acute abnormality I independently  reviewed the following imaging with scope of interpretation limited to determining acute life threatening conditions related to emergency care: CT Abdomen/Pelvis and agree with the radiologist interpretation with the following exceptions: none I personally reviewed and interpreted cardiac monitoring: normal sinus rhythm  I personally reviewed and interpreted the pt's EKG: see above for interpretation  I have reviewed the patients home medications and made adjustments as needed Consults: Gastroenterology Social Determinants of health:  Elderly         Final Clinical Impression(s) / ED Diagnoses Final diagnoses:  Gastroesophageal reflux disease with esophagitis, unspecified whether hemorrhage  Epigastric pain  Duodenitis  Nausea    Rx / DC Orders ED Discharge Orders     None  Rondel Baton, MD 05/08/23 [phone]

## 2023-05-09 ENCOUNTER — Inpatient Hospital Stay (HOSPITAL_COMMUNITY): Payer: Medicare Other

## 2023-05-09 DIAGNOSIS — K311 Adult hypertrophic pyloric stenosis: Secondary | ICD-10-CM | POA: Diagnosis not present

## 2023-05-09 DIAGNOSIS — K2980 Duodenitis without bleeding: Secondary | ICD-10-CM

## 2023-05-09 LAB — BASIC METABOLIC PANEL
Anion gap: 10 (ref 5–15)
BUN: 13 mg/dL (ref 8–23)
CO2: 22 mmol/L (ref 22–32)
Calcium: 8.8 mg/dL — ABNORMAL LOW (ref 8.9–10.3)
Chloride: 105 mmol/L (ref 98–111)
Creatinine, Ser: 1.05 mg/dL (ref 0.61–1.24)
GFR, Estimated: >60 mL/min (ref >60)
Glucose, Bld: 103 mg/dL — ABNORMAL HIGH (ref 70–99)
Potassium: 4.3 mmol/L (ref 3.5–5.1)
Sodium: 137 mmol/L (ref 135–145)

## 2023-05-09 LAB — CBC
HCT: 47.5 % (ref 39.0–52.0)
Hemoglobin: 16.4 g/dL (ref 13.0–17.0)
MCH: 30.3 pg (ref 26.0–34.0)
MCHC: 34.5 g/dL (ref 30.0–36.0)
MCV: 87.6 fL (ref 80.0–100.0)
Platelets: 201 10*3/uL (ref 150–400)
RBC: 5.42 MIL/uL (ref 4.22–5.81)
RDW: 12.2 % (ref 11.5–15.5)
WBC: 9 10*3/uL (ref 4.0–10.5)
nRBC: 0 % (ref 0.0–0.2)

## 2023-05-09 MED ORDER — HYDRALAZINE HCL 20 MG/ML IJ SOLN: 10.0000 mg | INTRAMUSCULAR | Status: DC | PRN

## 2023-05-09 MED ORDER — TRAZODONE HCL 50 MG PO TABS: 50.0000 mg | ORAL_TABLET | Freq: Every evening | ORAL | Status: DC | PRN

## 2023-05-09 MED ORDER — METOPROLOL TARTRATE 5 MG/5ML IV SOLN: 5.0000 mg | INTRAVENOUS | Status: DC | PRN

## 2023-05-09 MED ORDER — GUAIFENESIN 100 MG/5ML PO LIQD: 5.0000 mL | ORAL | Status: DC | PRN

## 2023-05-09 MED ORDER — IPRATROPIUM-ALBUTEROL 0.5-2.5 (3) MG/3ML IN SOLN: 3.0000 mL | RESPIRATORY_TRACT | Status: DC | PRN

## 2023-05-09 MED ORDER — PHENOL 1.4 % MT LIQD
1.0000 | OROMUCOSAL | Status: DC | PRN
  Filled 2023-05-09: qty 177

## 2023-05-09 MED ORDER — SENNOSIDES-DOCUSATE SODIUM 8.6-50 MG PO TABS: 1.0000 | ORAL_TABLET | Freq: Every evening | ORAL | Status: DC | PRN

## 2023-05-09 NOTE — H&P (View-Only) (Signed)
Consultation  Referring Provider:  Dr Nelson Chimes    Primary Care Physician:  Corwin Levins, MD Primary Gastroenterologist: Dr Leone Payor        Reason for Consultation:  Epi pain/N/V with abn CT            HPI:   Charles Lawson is a 82 y.o. male  With HLD, BPH With 2 week H/O epi pain, intermittent N/V/new onset heartburn and postprandial abdominal bloating Eval by PCP- Neg [place] except for fatty liver Seen in ED, Nl CBC, CMP, lipase CT Abdo/pelvis showing duodenitis, possibly partial outlet obstruction.   Admitted for further evaluation  Has been using Goody's but not much-could not exactly quantify as to how much.  Not every day. 1 in 2-3 weeks.  No wt loss, melena or hematochezia.  No jaundice dark urine or pale stools.  Past GI workup: -Never had EGD -Colonoscopy January 2019: NEG for any polyps, pancolonic diverticulosis.  He did have polyps previously in 2013.  No need to repeat D/T AGE.  Good prep. -[place]: 05/06/2023: Fatty liver, normal gallbladder   Past Medical History:  Diagnosis Date   Allergic rhinitis, cause unspecified    Arthritis    Blood pressure elevated without history of HTN    BPH (benign prostatic hyperplasia)    Diverticulosis of colon    ED (erectile dysfunction)    History of basal cell carcinoma (BCC) excision    Hyperlipidemia    Impaired glucose tolerance 04/03/2013   Personal history of colonic polyps 06/07/2012    Past Surgical History:  Procedure Laterality Date   Quintella Reichert OSTEOTOMY Left 07/19/2020   Procedure: Quintella Reichert OSTEOTOMY;  Surgeon: Edwin Cap, DPM;  Location: [facility] [place];  Service: Podiatry;  Laterality: Left;   BUNIONECTOMY Left 07/19/2020   Procedure: Delos Haring;  Surgeon: Edwin Cap, DPM;  Location: [facility];  Service: Podiatry;  Laterality: Left;   COLONOSCOPY  last one 10-06-2017  dr Leone Payor   [place] TOE SURGERY Left 07/19/2020   Procedure: HAMMER TOE CORRECTION;  Surgeon: Edwin Cap, DPM;  Location: [facility] [place];  Service: Podiatry;  Laterality: Left;   INGUINAL HERNIA REPAIR Right 11-21-2008  @ [facility]   LUMBAR LAMINECTOMY  1974   METATARSAL OSTEOTOMY Left 07/19/2020   Procedure: METATARSAL OSTEOTOMY;  Surgeon: Edwin Cap, DPM;  Location: [facility] [place];  Service: Podiatry;  Laterality: Left;   ORIF TOE FRACTURE Left 07/19/2020   Procedure: OPEN TREATMENT OF METARSAL PHALANGEAL JOINT DISLOCATION;  Surgeon: Edwin Cap, DPM;  Location: [facility] [place];  Service: Podiatry;  Laterality: Left;  ANKLE BLOCK WITH THIGH TOURNIQUET    Family History  Problem Relation Age of Onset   COPD Mother    Cancer Father    Lung cancer Father    Colon cancer Neg Hx    Colon polyps Neg Hx    Rectal cancer Neg Hx    Stomach cancer Neg Hx      Social History   Tobacco Use   Smoking status: Never   Smokeless tobacco: Never  Vaping Use   Vaping status: Never Used  Substance Use Topics   Alcohol use: Yes    Comment: occaional   Drug use: Never    Prior to Admission medications   Medication Sig Start Date End Date Taking? Authorizing Provider  ondansetron (ZOFRAN) 4 MG tablet Take 1 tablet (4 mg total) by mouth every 8 (eight) hours as  needed for nausea or vomiting. Patient taking differently: Take 4 mg by mouth as needed for nausea or vomiting. 04/30/23  Yes Myrlene Broker, MD  pantoprazole (PROTONIX) 40 MG tablet Take 1 tablet (40 mg total) by mouth daily. 04/30/23  Yes Myrlene Broker, MD    Current Facility-Administered Medications  Medication Dose Route Frequency Provider Last Rate Last Admin   acetaminophen (TYLENOL) tablet 650 mg  650 mg Oral Q6H PRN Hillary Bow, DO       Or   acetaminophen (TYLENOL) suppository 650 mg  650 mg Rectal Q6H PRN Hillary Bow, DO       guaiFENesin (ROBITUSSIN) 100 MG/5ML liquid 5 mL  5 mL Oral Q4H PRN Amin, Ankit C, MD       hydrALAZINE (APRESOLINE) injection 10 mg  10  mg Intravenous Q4H PRN Amin, Ankit C, MD       ipratropium-albuterol (DUONEB) 0.5-2.5 (3) MG/3ML nebulizer solution 3 mL  3 mL Nebulization Q4H PRN Amin, Ankit C, MD       lactated ringers infusion   Intravenous Continuous Hillary Bow, DO 100 mL/hr at 05/09/23 1610 New Bag at 05/09/23 9604   metoprolol tartrate (LOPRESSOR) injection 5 mg  5 mg Intravenous Q4H PRN Amin, Ankit C, MD       morphine (PF) 2 MG/ML injection 2-4 mg  2-4 mg Intravenous Q4H PRN Hillary Bow, DO       ondansetron [facility]) tablet 4 mg  4 mg Oral Q6H PRN Hillary Bow, DO       Or   ondansetron [facility]) injection 4 mg  4 mg Intravenous Q6H PRN Hillary Bow, DO   4 mg at 05/08/23 2141   pantoprazole (PROTONIX) injection 40 mg  40 mg Intravenous Q12H Lyda Perone M, DO   40 mg at 05/09/23 5409   senna-docusate (Senokot-S) tablet 1 tablet  1 tablet Oral QHS PRN Amin, Ankit C, MD       traZODone (DESYREL) tablet 50 mg  50 mg Oral QHS PRN Amin, Ankit C, MD        Allergies as of 05/08/2023 - Review Complete 05/08/2023  Allergen Reaction Noted   Penicillins  05/31/2007     Review of Systems:    As per HPI, otherwise negative    Physical Exam:  Vital signs in last 24 hours: Temp:  [97.7 F (36.5 C)-99.1 F (37.3 C)] 97.7 F (36.5 C) (08/18 0637) Pulse Rate:  [64-84] 66 (08/18 0637) Resp:  [17-20] 20 (08/18 0637) BP: (140-167)/(79-87) 146/79 (08/18 0637) SpO2:  [94 %-99 %] 97 % (08/18 0637) Weight:  [90.7 kg] 90.7 kg (08/17 1115) Last BM Date : 05/09/23 Gen: awake, alert, NAD HEENT: anicteric, no pallor CV: RRR, no mrg Pulm: CTA b/l Abd: soft, NT/ND, +BS throughout Ext: no c/c/e Neuro: nonfocal   LAB RESULTS: Recent Labs    05/08/23 1122 05/09/23 0453  WBC 7.9 9.0  HGB 17.5* 16.4  HCT 50.3 47.5  PLT 96* 201   BMET Recent Labs    05/08/23 1245 05/09/23 0453  NA 138 137  K 3.6 4.3  CL 103 105  CO2 22 22  GLUCOSE 94 103*  BUN 13 13  CREATININE 1.03 1.05  CALCIUM 8.9 8.8*    LFT Recent Labs    05/08/23 1245  PROT 7.0  ALBUMIN 3.9  AST 21  ALT 29  ALKPHOS 47  BILITOT 1.1   PT/INR No results for input(s): "LABPROT", "INR" in the  last 72 hours.  STUDIES: CT ABDOMEN PELVIS W CONTRAST  Result Date: 05/08/2023 CLINICAL DATA:  Upper abdominal pain for two weeks. Patient [place] on Thursday and he stated that was negative. N/V/D over the last few weeks. No fever. EXAM: CT ABDOMEN AND PELVIS WITH CONTRAST TECHNIQUE: Multidetector CT imaging of the abdomen and pelvis was performed using the standard protocol following bolus administration of intravenous contrast. RADIATION DOSE REDUCTION: This exam was performed according to the departmental dose-optimization program which includes automated exposure control, adjustment of the mA and/or kV according to patient size and/or use of iterative reconstruction technique. CONTRAST:  OMNIPAQUE IOHEXOL 300 MG/ML  SOLN COMPARISON:  Abdominal sonogram 05/06/2023 FINDINGS: Lower chest: Scarring noted within both lung bases and lingula. No pleural effusion or consolidative change. Hepatobiliary: No focal liver abnormality is seen. No gallstones, gallbladder wall thickening, or biliary dilatation. Pancreas: Unremarkable. No pancreatic ductal dilatation or surrounding inflammatory changes. Spleen: Normal in size without focal abnormality. Adrenals/Urinary Tract: Adrenal glands are unremarkable. Kidneys are normal, without renal calculi, focal lesion, or hydronephrosis. Bladder is unremarkable. Stomach/Bowel: There is moderate diffuse distension of the stomach and pylorus. Abrupt caliber change with luminal narrowing is noted involving the proximal portion of the descending duodenum with mild surrounding soft tissue stranding and mucosal enhancement, image 50/5. Nonspecific. No pathologic dilatation of the large or small bowel loops. The appendix is not visualized and may be surgically absent. There are no secondary signs of acute  appendicitis. Sigmoid diverticulosis without signs of acute diverticulitis. Vascular/Lymphatic: Aortic atherosclerosis. No enlarged abdominal or pelvic lymph nodes. Reproductive: Prostate gland enlargement. Other: No free fluid or fluid collections. Fat containing left inguinal hernia. Musculoskeletal: No acute or significant osseous findings. Multilevel degenerative disc disease. No acute or suspicious osseous findings. IMPRESSION: 1. There is moderate diffuse distension of the stomach and pylorus. Abrupt caliber change with luminal narrowing is noted involving the proximal portion of the descending duodenum with mild surrounding soft tissue stranding and mucosal enhancement. Findings are nonspecific and may represent a focal area of duodenitis. Underlying neoplasm cannot be excluded. Consider further evaluation with endoscopy. 2. Sigmoid diverticulosis without signs of acute diverticulitis. 3. Prostate gland enlargement. 4. Fat containing left inguinal hernia. 5.  Aortic Atherosclerosis (ICD10-I70.0). Electronically Signed   By: Signa Kell M.D.   On: 05/08/2023 14:56       Impression / Plan:    Duodenitis- ( likely PUD, r/o underlying mass) with partial gastric outlet obstruction.    Plan: -Keep NPO -IVF -NG tube -IV Protonix -Serial X Ray KUBs -Monitor labs including electrolytes -EGD in AM with Dr Leone Payor -Avoid all nonsteroidals  Explained risks and benefits in detail to pt and pt's wife.  I am glad Dr. Leone Payor knows him from previous colonoscopy    Edman Circle, MD [facility] [phone]    LOS: 1 day   Lynann Bologna  05/09/2023, 10:28 AM

## 2023-05-09 NOTE — Progress Notes (Signed)
Charles Lawson "Rosanne Ashing" arrived to floor from [place]. Laban Emperor (MD) informed of patient's admission.

## 2023-05-09 NOTE — Consult Note (Addendum)
Consultation  Referring Provider:  Dr Nelson Chimes    Primary Care Physician:  Corwin Levins, MD Primary Gastroenterologist: Dr Leone Payor        Reason for Consultation:  Epi pain/N/V with abn CT            HPI:   Charles Lawson is a 82 y.o. male  With HLD, BPH With 2 week H/O epi pain, intermittent N/V/new onset heartburn and postprandial abdominal bloating Eval by PCP- Neg [place] except for fatty liver Seen in ED, Nl CBC, CMP, lipase CT Abdo/pelvis showing duodenitis, possibly partial outlet obstruction.   Admitted for further evaluation  Has been using Goody's but not much-could not exactly quantify as to how much.  Not every day. 1 in 2-3 weeks.  No wt loss, melena or hematochezia.  No jaundice dark urine or pale stools.  Past GI workup: -Never had EGD -Colonoscopy January 2019: NEG for any polyps, pancolonic diverticulosis.  He did have polyps previously in 2013.  No need to repeat D/T AGE.  Good prep. -[place]: 05/06/2023: Fatty liver, normal gallbladder   Past Medical History:  Diagnosis Date   Allergic rhinitis, cause unspecified    Arthritis    Blood pressure elevated without history of HTN    BPH (benign prostatic hyperplasia)    Diverticulosis of colon    ED (erectile dysfunction)    History of basal cell carcinoma (BCC) excision    Hyperlipidemia    Impaired glucose tolerance 04/03/2013   Personal history of colonic polyps 06/07/2012    Past Surgical History:  Procedure Laterality Date   Quintella Reichert OSTEOTOMY Left 07/19/2020   Procedure: Quintella Reichert OSTEOTOMY;  Surgeon: Edwin Cap, DPM;  Location: [facility] [place];  Service: Podiatry;  Laterality: Left;   BUNIONECTOMY Left 07/19/2020   Procedure: Delos Haring;  Surgeon: Edwin Cap, DPM;  Location: [facility];  Service: Podiatry;  Laterality: Left;   COLONOSCOPY  last one 10-06-2017  dr Leone Payor   [place] TOE SURGERY Left 07/19/2020   Procedure: HAMMER TOE CORRECTION;  Surgeon: Edwin Cap, DPM;  Location: [facility] [place];  Service: Podiatry;  Laterality: Left;   INGUINAL HERNIA REPAIR Right 11-21-2008  @ [facility]   LUMBAR LAMINECTOMY  1974   METATARSAL OSTEOTOMY Left 07/19/2020   Procedure: METATARSAL OSTEOTOMY;  Surgeon: Edwin Cap, DPM;  Location: [facility] [place];  Service: Podiatry;  Laterality: Left;   ORIF TOE FRACTURE Left 07/19/2020   Procedure: OPEN TREATMENT OF METARSAL PHALANGEAL JOINT DISLOCATION;  Surgeon: Edwin Cap, DPM;  Location: [facility] [place];  Service: Podiatry;  Laterality: Left;  ANKLE BLOCK WITH THIGH TOURNIQUET    Family History  Problem Relation Age of Onset   COPD Mother    Cancer Father    Lung cancer Father    Colon cancer Neg Hx    Colon polyps Neg Hx    Rectal cancer Neg Hx    Stomach cancer Neg Hx      Social History   Tobacco Use   Smoking status: Never   Smokeless tobacco: Never  Vaping Use   Vaping status: Never Used  Substance Use Topics   Alcohol use: Yes    Comment: occaional   Drug use: Never    Prior to Admission medications   Medication Sig Start Date End Date Taking? Authorizing Provider  ondansetron (ZOFRAN) 4 MG tablet Take 1 tablet (4 mg total) by mouth every 8 (eight) hours as  needed for nausea or vomiting. Patient taking differently: Take 4 mg by mouth as needed for nausea or vomiting. 04/30/23  Yes Myrlene Broker, MD  pantoprazole (PROTONIX) 40 MG tablet Take 1 tablet (40 mg total) by mouth daily. 04/30/23  Yes Myrlene Broker, MD    Current Facility-Administered Medications  Medication Dose Route Frequency Provider Last Rate Last Admin   acetaminophen (TYLENOL) tablet 650 mg  650 mg Oral Q6H PRN Hillary Bow, DO       Or   acetaminophen (TYLENOL) suppository 650 mg  650 mg Rectal Q6H PRN Hillary Bow, DO       guaiFENesin (ROBITUSSIN) 100 MG/5ML liquid 5 mL  5 mL Oral Q4H PRN Amin, Ankit C, MD       hydrALAZINE (APRESOLINE) injection 10 mg  10  mg Intravenous Q4H PRN Amin, Ankit C, MD       ipratropium-albuterol (DUONEB) 0.5-2.5 (3) MG/3ML nebulizer solution 3 mL  3 mL Nebulization Q4H PRN Amin, Ankit C, MD       lactated ringers infusion   Intravenous Continuous Hillary Bow, DO 100 mL/hr at 05/09/23 1610 New Bag at 05/09/23 9604   metoprolol tartrate (LOPRESSOR) injection 5 mg  5 mg Intravenous Q4H PRN Amin, Ankit C, MD       morphine (PF) 2 MG/ML injection 2-4 mg  2-4 mg Intravenous Q4H PRN Hillary Bow, DO       ondansetron [facility]) tablet 4 mg  4 mg Oral Q6H PRN Hillary Bow, DO       Or   ondansetron [facility]) injection 4 mg  4 mg Intravenous Q6H PRN Hillary Bow, DO   4 mg at 05/08/23 2141   pantoprazole (PROTONIX) injection 40 mg  40 mg Intravenous Q12H Lyda Perone M, DO   40 mg at 05/09/23 5409   senna-docusate (Senokot-S) tablet 1 tablet  1 tablet Oral QHS PRN Amin, Ankit C, MD       traZODone (DESYREL) tablet 50 mg  50 mg Oral QHS PRN Amin, Ankit C, MD        Allergies as of 05/08/2023 - Review Complete 05/08/2023  Allergen Reaction Noted   Penicillins  05/31/2007     Review of Systems:    As per HPI, otherwise negative    Physical Exam:  Vital signs in last 24 hours: Temp:  [97.7 F (36.5 C)-99.1 F (37.3 C)] 97.7 F (36.5 C) (08/18 0637) Pulse Rate:  [64-84] 66 (08/18 0637) Resp:  [17-20] 20 (08/18 0637) BP: (140-167)/(79-87) 146/79 (08/18 0637) SpO2:  [94 %-99 %] 97 % (08/18 0637) Weight:  [90.7 kg] 90.7 kg (08/17 1115) Last BM Date : 05/09/23 Gen: awake, alert, NAD HEENT: anicteric, no pallor CV: RRR, no mrg Pulm: CTA b/l Abd: soft, NT/ND, +BS throughout Ext: no c/c/e Neuro: nonfocal   LAB RESULTS: Recent Labs    05/08/23 1122 05/09/23 0453  WBC 7.9 9.0  HGB 17.5* 16.4  HCT 50.3 47.5  PLT 96* 201   BMET Recent Labs    05/08/23 1245 05/09/23 0453  NA 138 137  K 3.6 4.3  CL 103 105  CO2 22 22  GLUCOSE 94 103*  BUN 13 13  CREATININE 1.03 1.05  CALCIUM 8.9 8.8*    LFT Recent Labs    05/08/23 1245  PROT 7.0  ALBUMIN 3.9  AST 21  ALT 29  ALKPHOS 47  BILITOT 1.1   PT/INR No results for input(s): "LABPROT", "INR" in the  last 72 hours.  STUDIES: CT ABDOMEN PELVIS W CONTRAST  Result Date: 05/08/2023 CLINICAL DATA:  Upper abdominal pain for two weeks. Patient [place] on Thursday and he stated that was negative. N/V/D over the last few weeks. No fever. EXAM: CT ABDOMEN AND PELVIS WITH CONTRAST TECHNIQUE: Multidetector CT imaging of the abdomen and pelvis was performed using the standard protocol following bolus administration of intravenous contrast. RADIATION DOSE REDUCTION: This exam was performed according to the departmental dose-optimization program which includes automated exposure control, adjustment of the mA and/or kV according to patient size and/or use of iterative reconstruction technique. CONTRAST:  OMNIPAQUE IOHEXOL 300 MG/ML  SOLN COMPARISON:  Abdominal sonogram 05/06/2023 FINDINGS: Lower chest: Scarring noted within both lung bases and lingula. No pleural effusion or consolidative change. Hepatobiliary: No focal liver abnormality is seen. No gallstones, gallbladder wall thickening, or biliary dilatation. Pancreas: Unremarkable. No pancreatic ductal dilatation or surrounding inflammatory changes. Spleen: Normal in size without focal abnormality. Adrenals/Urinary Tract: Adrenal glands are unremarkable. Kidneys are normal, without renal calculi, focal lesion, or hydronephrosis. Bladder is unremarkable. Stomach/Bowel: There is moderate diffuse distension of the stomach and pylorus. Abrupt caliber change with luminal narrowing is noted involving the proximal portion of the descending duodenum with mild surrounding soft tissue stranding and mucosal enhancement, image 50/5. Nonspecific. No pathologic dilatation of the large or small bowel loops. The appendix is not visualized and may be surgically absent. There are no secondary signs of acute  appendicitis. Sigmoid diverticulosis without signs of acute diverticulitis. Vascular/Lymphatic: Aortic atherosclerosis. No enlarged abdominal or pelvic lymph nodes. Reproductive: Prostate gland enlargement. Other: No free fluid or fluid collections. Fat containing left inguinal hernia. Musculoskeletal: No acute or significant osseous findings. Multilevel degenerative disc disease. No acute or suspicious osseous findings. IMPRESSION: 1. There is moderate diffuse distension of the stomach and pylorus. Abrupt caliber change with luminal narrowing is noted involving the proximal portion of the descending duodenum with mild surrounding soft tissue stranding and mucosal enhancement. Findings are nonspecific and may represent a focal area of duodenitis. Underlying neoplasm cannot be excluded. Consider further evaluation with endoscopy. 2. Sigmoid diverticulosis without signs of acute diverticulitis. 3. Prostate gland enlargement. 4. Fat containing left inguinal hernia. 5.  Aortic Atherosclerosis (ICD10-I70.0). Electronically Signed   By: Signa Kell M.D.   On: 05/08/2023 14:56       Impression / Plan:    Duodenitis- ( likely PUD, r/o underlying mass) with partial gastric outlet obstruction.    Plan: -Keep NPO -IVF -NG tube -IV Protonix -Serial X Ray KUBs -Monitor labs including electrolytes -EGD in AM with Dr Leone Payor -Avoid all nonsteroidals  Explained risks and benefits in detail to pt and pt's wife.  I am glad Dr. Leone Payor knows him from previous colonoscopy    Edman Circle, MD [facility] [phone]    LOS: 1 day   Lynann Bologna  05/09/2023, 10:28 AM

## 2023-05-09 NOTE — Plan of Care (Signed)

## 2023-05-09 NOTE — Progress Notes (Signed)
PROGRESS NOTE    Charles Lawson  XBJ:[id number] DOB: November 10, 1940 DOA: 05/08/2023 PCP: Corwin Levins, MD   Brief Narrative:  82 year old with history of HLD comes to the hospital with complaints of epigastric pain ongoing for 2 weeks.  Had outpatient ultrasound on Thursday which was negative.  In the ED CT showed concerns of significant duodenitis causing luminal narrowing.  [facility] was consulted by EDP.   Assessment & Plan:  Principal Problem:   Duodenitis Active Problems:   Thrombocytopenia (HCC)   Abdominal pain secondary to duodenitis and concerns of gastric outlet obstruction - Unclear what this is inflammation versus inflammatory from ulcer versus mass versus infection.  There is some luminal changes seen on the CT scan.  Seen by [facility], will place NG tube to suction, keep him n.p.o.  Plans for endoscopy tomorrow.  Thrombocytopenia - Appears to have resolved.  Platelets are normal this morning   DVT prophylaxis: SCDs Code Status: Full code Family Communication:   Status is: Inpatient Remains inpatient appropriate because: Continue hospital stay for GI evaluation       Diet Orders (From admission, onward)     Start     Ordered   05/09/23 0001  Diet NPO time specified Except for: Sips with Meds  Diet effective midnight       Question:  Except for  Answer:  Clearance Coots with Meds   05/08/23 1930            Subjective: Seen at bedside, still having abdominal pain but denies any nausea and vomiting at rest.  Denies having any GI symptoms in the past until it started recently   Examination:  General exam: Appears calm and comfortable  Respiratory system: Clear to auscultation. Respiratory effort normal. Cardiovascular system: S1 & S2 heard, RRR. No JVD, murmurs, rubs, gallops or clicks. No pedal edema. Gastrointestinal system: Abdomen is nondistended, soft and nontender. No organomegaly or masses felt. Normal bowel sounds heard. Central nervous system: Alert and oriented. No  focal neurological deficits. Extremities: Symmetric 5 x 5 power. Skin: No rashes, lesions or ulcers Psychiatry: Judgement and insight appear normal. Mood & affect appropriate.  Objective: Vitals:   05/08/23 1922 05/08/23 2323 05/09/23 0332 05/09/23 0637  BP: (!) 160/87 (!) 167/85 (!) 147/86 (!) 146/79  Pulse: 64 84 80 66  Resp: 20 20 20 20   Temp: 98 F (36.7 C) 99.1 F (37.3 C) 98.4 F (36.9 C) 97.7 F (36.5 C)  TempSrc: Oral Oral Oral Oral  SpO2: 99% 98% 95% 97%  Weight:      Height:        Intake/Output Summary (Last 24 hours) at 05/09/2023 3086 Last data filed at 05/09/2023 0345 Gross per 24 hour  Intake 462.06 ml  Output 460 ml  Net 2.06 ml   Filed Weights   05/08/23 1115  Weight: 90.7 kg    Scheduled Meds:  pantoprazole (PROTONIX) IV  40 mg Intravenous Q12H   Continuous Infusions:  lactated ringers 100 mL/hr at 05/09/23 5784    Nutritional status     Body mass index is 27.12 kg/m.  Data Reviewed:   CBC: Recent Labs  Lab 05/08/23 1122 05/09/23 0453  WBC 7.9 9.0  HGB 17.5* 16.4  HCT 50.3 47.5  MCV 85.5 87.6  PLT 96* 201   Basic Metabolic Panel: Recent Labs  Lab 05/08/23 1245 05/09/23 0453  NA 138 137  K 3.6 4.3  CL 103 105  CO2 22 22  GLUCOSE 94 103*  BUN 13 13  CREATININE 1.03 1.05  CALCIUM 8.9 8.8*   GFR: Estimated Creatinine Clearance: 62.6 mL/min (by C-G formula based on SCr of 1.05 mg/dL). Liver Function Tests: Recent Labs  Lab 05/08/23 1245  AST 21  ALT 29  ALKPHOS 47  BILITOT 1.1  PROT 7.0  ALBUMIN 3.9   Recent Labs  Lab 05/08/23 1122  LIPASE 48   No results for input(s): "AMMONIA" in the last 168 hours. Coagulation Profile: No results for input(s): "INR", "PROTIME" in the last 168 hours. Cardiac Enzymes: No results for input(s): "CKTOTAL", "CKMB", "CKMBINDEX", "TROPONINI" in the last 168 hours. BNP (last 3 results) No results for input(s): "PROBNP" in the last 8760 hours. HbA1C: No results for input(s):  "HGBA1C" in the last 72 hours. CBG: No results for input(s): "GLUCAP" in the last 168 hours. Lipid Profile: No results for input(s): "CHOL", "HDL", "LDLCALC", "TRIG", "CHOLHDL", "LDLDIRECT" in the last 72 hours. Thyroid Function Tests: No results for input(s): "TSH", "T4TOTAL", "FREET4", "T3FREE", "THYROIDAB" in the last 72 hours. Anemia Panel: No results for input(s): "VITAMINB12", "FOLATE", "FERRITIN", "TIBC", "IRON", "RETICCTPCT" in the last 72 hours. Sepsis Labs: No results for input(s): "PROCALCITON", "LATICACIDVEN" in the last 168 hours.  No results found for this or any previous visit (from the past 240 hour(s)).       Radiology Studies: CT ABDOMEN PELVIS W CONTRAST  Result Date: 05/08/2023 CLINICAL DATA:  Upper abdominal pain for two weeks. Patient [place] on Thursday and he stated that was negative. N/V/D over the last few weeks. No fever. EXAM: CT ABDOMEN AND PELVIS WITH CONTRAST TECHNIQUE: Multidetector CT imaging of the abdomen and pelvis was performed using the standard protocol following bolus administration of intravenous contrast. RADIATION DOSE REDUCTION: This exam was performed according to the departmental dose-optimization program which includes automated exposure control, adjustment of the mA and/or kV according to patient size and/or use of iterative reconstruction technique. CONTRAST:  OMNIPAQUE IOHEXOL 300 MG/ML  SOLN COMPARISON:  Abdominal sonogram 05/06/2023 FINDINGS: Lower chest: Scarring noted within both lung bases and lingula. No pleural effusion or consolidative change. Hepatobiliary: No focal liver abnormality is seen. No gallstones, gallbladder wall thickening, or biliary dilatation. Pancreas: Unremarkable. No pancreatic ductal dilatation or surrounding inflammatory changes. Spleen: Normal in size without focal abnormality. Adrenals/Urinary Tract: Adrenal glands are unremarkable. Kidneys are normal, without renal calculi, focal lesion, or hydronephrosis. Bladder  is unremarkable. Stomach/Bowel: There is moderate diffuse distension of the stomach and pylorus. Abrupt caliber change with luminal narrowing is noted involving the proximal portion of the descending duodenum with mild surrounding soft tissue stranding and mucosal enhancement, image 50/5. Nonspecific. No pathologic dilatation of the large or small bowel loops. The appendix is not visualized and may be surgically absent. There are no secondary signs of acute appendicitis. Sigmoid diverticulosis without signs of acute diverticulitis. Vascular/Lymphatic: Aortic atherosclerosis. No enlarged abdominal or pelvic lymph nodes. Reproductive: Prostate gland enlargement. Other: No free fluid or fluid collections. Fat containing left inguinal hernia. Musculoskeletal: No acute or significant osseous findings. Multilevel degenerative disc disease. No acute or suspicious osseous findings. IMPRESSION: 1. There is moderate diffuse distension of the stomach and pylorus. Abrupt caliber change with luminal narrowing is noted involving the proximal portion of the descending duodenum with mild surrounding soft tissue stranding and mucosal enhancement. Findings are nonspecific and may represent a focal area of duodenitis. Underlying neoplasm cannot be excluded. Consider further evaluation with endoscopy. 2. Sigmoid diverticulosis without signs of acute diverticulitis. 3. Prostate gland enlargement. 4. Fat  containing left inguinal hernia. 5.  Aortic Atherosclerosis (ICD10-I70.0). Electronically Signed   By: Signa Kell M.D.   On: 05/08/2023 14:56           LOS: 1 day   Time spent= 35 mins    [name], MD [facility]  If 7PM-7AM, please contact night-coverage  05/09/2023, 8:22 AM

## 2023-05-09 NOTE — Plan of Care (Signed)
  Problem: Education: Goal: Knowledge of General Education information will improve Description: Including pain rating scale, medication(s)/side effects and non-pharmacologic comfort measures Outcome: Progressing   Problem: Clinical Measurements: Goal: Will remain free from infection Outcome: Progressing   Problem: Activity: Goal: Risk for activity intolerance will decrease Outcome: Progressing   Problem: Elimination: Goal: Will not experience complications related to urinary retention Outcome: Progressing   Problem: Safety: Goal: Ability to remain free from injury will improve Outcome: Progressing

## 2023-05-10 ENCOUNTER — Inpatient Hospital Stay (HOSPITAL_COMMUNITY): Payer: Medicare Other | Admitting: Anesthesiology

## 2023-05-10 ENCOUNTER — Inpatient Hospital Stay (HOSPITAL_COMMUNITY): Payer: Medicare Other

## 2023-05-10 ENCOUNTER — Encounter (HOSPITAL_COMMUNITY): Payer: Self-pay | Admitting: Family Medicine

## 2023-05-10 ENCOUNTER — Encounter (HOSPITAL_COMMUNITY)
Admission: EM | Disposition: A | Discharge status: Other Acute Inpatient Hospital | Payer: Self-pay | Source: Home / Self Care | Attending: Internal Medicine

## 2023-05-10 DIAGNOSIS — K2970 Gastritis, unspecified, without bleeding: Secondary | ICD-10-CM

## 2023-05-10 DIAGNOSIS — K315 Obstruction of duodenum: Secondary | ICD-10-CM

## 2023-05-10 DIAGNOSIS — K311 Adult hypertrophic pyloric stenosis: Secondary | ICD-10-CM

## 2023-05-10 DIAGNOSIS — D132 Benign neoplasm of duodenum: Secondary | ICD-10-CM

## 2023-05-10 DIAGNOSIS — K3189 Other diseases of stomach and duodenum: Secondary | ICD-10-CM | POA: Diagnosis not present

## 2023-05-10 DIAGNOSIS — K2980 Duodenitis without bleeding: Secondary | ICD-10-CM | POA: Diagnosis not present

## 2023-05-10 HISTORY — PX: ESOPHAGOGASTRODUODENOSCOPY (EGD) WITH PROPOFOL: SHX5813

## 2023-05-10 HISTORY — PX: BIOPSY: SHX5522

## 2023-05-10 LAB — BASIC METABOLIC PANEL
Anion gap: 10 (ref 5–15)
BUN: 11 mg/dL (ref 8–23)
CO2: 24 mmol/L (ref 22–32)
Calcium: 8.7 mg/dL — ABNORMAL LOW (ref 8.9–10.3)
Chloride: 103 mmol/L (ref 98–111)
Creatinine, Ser: 1.06 mg/dL (ref 0.61–1.24)
GFR, Estimated: >60 mL/min (ref >60)
Glucose, Bld: 98 mg/dL (ref 70–99)
Potassium: 4.3 mmol/L (ref 3.5–5.1)
Sodium: 137 mmol/L (ref 135–145)

## 2023-05-10 LAB — CBC
HCT: 48.5 % (ref 39.0–52.0)
Hemoglobin: 16.8 g/dL (ref 13.0–17.0)
MCH: 30.3 pg (ref 26.0–34.0)
MCHC: 34.6 g/dL (ref 30.0–36.0)
MCV: 87.4 fL (ref 80.0–100.0)
Platelets: 203 10*3/uL (ref 150–400)
RBC: 5.55 MIL/uL (ref 4.22–5.81)
RDW: 12.2 % (ref 11.5–15.5)
WBC: 14 10*3/uL — ABNORMAL HIGH (ref 4.0–10.5)
nRBC: 0 % (ref 0.0–0.2)

## 2023-05-10 LAB — PHOSPHORUS: Phosphorus: 3.5 mg/dL (ref 2.5–4.6)

## 2023-05-10 LAB — MAGNESIUM: Magnesium: 2.3 mg/dL (ref 1.7–2.4)

## 2023-05-10 SURGERY — ESOPHAGOGASTRODUODENOSCOPY (EGD) WITH PROPOFOL
Anesthesia: General

## 2023-05-10 MED ORDER — FENTANYL CITRATE (PF) 100 MCG/2ML IJ SOLN
INTRAMUSCULAR | Status: DC | PRN
  Administered 2023-05-10: 100 ug via INTRAVENOUS

## 2023-05-10 MED ORDER — PROPOFOL 500 MG/50ML IV EMUL
INTRAVENOUS | Status: AC
  Filled 2023-05-10: qty 50

## 2023-05-10 MED ORDER — LACTATED RINGERS IV SOLN
INTRAVENOUS | Status: DC
  Administered 2023-05-12: 1000 mL via INTRAVENOUS

## 2023-05-10 MED ORDER — PROPOFOL 10 MG/ML IV BOLUS
INTRAVENOUS | Status: DC | PRN
  Administered 2023-05-10: 30 mg via INTRAVENOUS
  Administered 2023-05-10: 120 mg via INTRAVENOUS

## 2023-05-10 MED ORDER — SUCCINYLCHOLINE CHLORIDE 200 MG/10ML IV SOSY
PREFILLED_SYRINGE | INTRAVENOUS | Status: DC | PRN
  Administered 2023-05-10: 120 mg via INTRAVENOUS

## 2023-05-10 MED ORDER — LIDOCAINE 2% (20 MG/ML) 5 ML SYRINGE
INTRAMUSCULAR | Status: DC | PRN
Start: 2023-05-10 — End: 2023-05-10
  Administered 2023-05-10: 100 mg via INTRAVENOUS

## 2023-05-10 MED ORDER — PHENYLEPHRINE 80 MCG/ML (10ML) SYRINGE FOR IV PUSH (FOR BLOOD PRESSURE SUPPORT)
PREFILLED_SYRINGE | INTRAVENOUS | Status: DC | PRN
Start: 2023-05-10 — End: 2023-05-10
  Administered 2023-05-10: 80 ug via INTRAVENOUS

## 2023-05-10 MED ORDER — FENTANYL CITRATE (PF) 100 MCG/2ML IJ SOLN
INTRAMUSCULAR | Status: AC
  Filled 2023-05-10: qty 2

## 2023-05-10 SURGICAL SUPPLY — 15 items

## 2023-05-10 NOTE — Interval H&P Note (Signed)
History and Physical Interval Note:  05/10/2023 1:35 PM  Charles Lawson  has presented today for surgery, with the diagnosis of Duodenal obstruction.  The various methods of treatment have been discussed with the patient and family. After consideration of risks, benefits and other options for treatment, the patient has consented to  Procedure(s): ESOPHAGOGASTRODUODENOSCOPY (EGD) WITH PROPOFOL (N/A) as a surgical intervention.  The patient's history has been reviewed, patient examined, no change in status, stable for surgery.  I have reviewed the patient's chart and labs.  Questions were answered to the patient's satisfaction.     Stan Head

## 2023-05-10 NOTE — Anesthesia Procedure Notes (Signed)
Procedure Name: Intubation Date/Time: 05/10/2023 1:49 PM  Performed by: Doran Clay, CRNAPre-anesthesia Checklist: Patient identified, Emergency Drugs available, Patient being monitored, Timeout performed and Suction available Patient Re-evaluated:Patient Re-evaluated prior to induction Oxygen Delivery Method: Circle system utilized Preoxygenation: Pre-oxygenation with 100% oxygen Induction Type: IV induction, Rapid sequence and Cricoid Pressure applied Laryngoscope Size: Mac and 4 Grade View: Grade I Tube type: Oral Tube size: 7.5 mm Number of attempts: 1 Airway Equipment and Method: Stylet Placement Confirmation: ETT inserted through vocal cords under direct vision, positive ETCO2 and breath sounds checked- equal and bilateral Secured at: 23 cm Tube secured with: Tape Dental Injury: Teeth and Oropharynx as per pre-operative assessment

## 2023-05-10 NOTE — Progress Notes (Signed)
   05/10/23 0950  TOC Brief Assessment  Insurance and Status Reviewed  Patient has primary care physician Yes  Home environment has been reviewed home with spouse  Prior level of function: independent  Prior/Current Home Services No current home services  Social Determinants of Health Reivew SDOH reviewed no interventions necessary  Readmission risk has been reviewed Yes  Transition of care needs no transition of care needs at this time

## 2023-05-10 NOTE — Plan of Care (Signed)
  Problem: Education: Goal: Knowledge of General Education information will improve Description: Including pain rating scale, medication(s)/side effects and non-pharmacologic comfort measures Outcome: Progressing   Problem: Coping: Goal: Level of anxiety will decrease Outcome: Progressing   Problem: Pain Managment: Goal: General experience of comfort will improve Outcome: Progressing   

## 2023-05-10 NOTE — Op Note (Addendum)
[facility] Patient Name: Charles Lawson Procedure Date: 05/10/2023 MRN: [id number] Attending MD: Iva Boop , MD, [id number] Date of Birth: [birth year] CSN: [id number] Age: 82 Admit Type: Inpatient Procedure:                Upper GI endoscopy Indications:              Gastric outlet obstruction Providers:                Iva Boop, MD, Norman Clay, RN, Beryle Beams,                            Technician, Albertina Senegal. Alday CRNA, CRNA Referring MD:              Medicines:                General Anesthesia Complications:            No immediate complications. Estimated Blood Loss:     Estimated blood loss was minimal. Procedure:                Pre-Anesthesia Assessment:                           - Prior to the procedure, a History and Physical                            was performed, and patient medications and                            allergies were reviewed. The patient's tolerance of                            previous anesthesia was also reviewed. The risks                            and benefits of the procedure and the sedation                            options and risks were discussed with the patient.                            All questions were answered, and informed consent                            was obtained. Prior Anticoagulants: The patient has                            taken no anticoagulant or antiplatelet agents. ASA                            Grade Assessment: II - A patient with mild systemic                            disease. After reviewing the risks and benefits,  the patient was deemed in satisfactory condition to                            undergo the procedure.                           After obtaining informed consent, the endoscope was                            passed under direct vision. Throughout the                            procedure, the patient's blood pressure, pulse, and                             oxygen saturations were monitored continuously. The                            [id number]-H190 ([id number]) Olympus endoscope was introduced                            through the mouth, and advanced to the second part                            of duodenum. It was exchanged for the ultraslim                            gastroscope. The upper GI endoscopy was                            accomplished without difficulty. The patient                            tolerated the procedure well. Scope In: Scope Out: Findings:      An acquired severe stenosis was found in the second portion of the       duodenum and was traversed using the ultraslim gastroscope. Biopsies       were taken with a cold forceps for histology. Verification of patient       identification for the specimen was done. Estimated blood loss was       minimal.      A deformity was found in the entire examined stomach.      A small amount of food (residue) was found in the gastric fundus.      Diffuse moderate inflammation characterized by congestion (edema),       erythema and friability was found in the entire examined stomach.       Biopsies were taken with a cold forceps for histology. Verification of       patient identification for the specimen was done. Estimated blood loss       was minimal.      The exam was otherwise without abnormality.      The cardia and gastric fundus were normal on retroflexion. 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 Some food debris in proximal                            duodenum also. Stricture was biopsied. Edema and                            mass effect - no visible ulcer though that is not                            excluded. No overt malignant changes but that is                            possible also. I could pass the ultraslim                            gastroscope to the distal edge of                             stenosis/stricture - length 3-5 cm I think                           - Acquired deformity in the entire stomach. Dilated.                           - A small amount of food (residue) in the stomach.                           - Gastritis. Biopsied. Body and antrunm, R/O H                            pylori                           - The examination was otherwise normal. Moderate Sedation:      Not Applicable - Patient had care per Anesthesia. Recommendation:           - Return patient to hospital ward for ongoing care.                           - replace NG tube to LIWS if vomiting - we had to                            remove for the EGD                           bid IV PPI                           f/u pathology - depending upon results and clinical                            courese could need a repeat EGD and possible  dilation - repeat EGD quite likely after PPI and                            NPI +/- NG suction allows edema to subside                            (hopefully)                           diet NPO except sips and chips Procedure Code(s):        --- Professional ---                           [phone], Esophagogastroduodenoscopy, flexible,                            transoral; with biopsy, single or multiple Diagnosis Code(s):        --- Professional ---                           K31.5, Obstruction of duodenum                           K31.89, Other diseases of stomach and duodenum                           K29.70, Gastritis, unspecified, without bleeding                           K31.1, Adult hypertrophic pyloric stenosis CPT copyright 2022 American [employer]. All rights reserved. The codes documented in this report are preliminary and upon coder review may  be revised to meet current compliance requirements. Iva Boop, MD 05/10/2023 2:28:55 PM This report has been signed electronically. Number of Addenda: 0

## 2023-05-10 NOTE — Plan of Care (Signed)
  Problem: Education: Goal: Knowledge of General Education information will improve Description: Including pain rating scale, medication(s)/side effects and non-pharmacologic comfort measures Outcome: Progressing   Problem: Pain Managment: Goal: General experience of comfort will improve Outcome: Progressing   

## 2023-05-10 NOTE — Anesthesia Preprocedure Evaluation (Addendum)
Anesthesia Evaluation  Patient identified by MRN, date of birth, ID band Patient awake    Reviewed: Allergy & Precautions, NPO status , Patient's Chart, lab work & pertinent test results  Airway Mallampati: III  TM Distance: >3 FB Neck ROM: Limited    Dental no notable dental hx. (+) Dental Advisory Given, Teeth Intact   Pulmonary asthma    Pulmonary exam normal breath sounds clear to auscultation       Cardiovascular negative cardio ROS  Rhythm:Regular Rate:Normal     Neuro/Psych negative neurological ROS     GI/Hepatic negative GI ROS, Neg liver ROS,,,  Endo/Other  negative endocrine ROS    Renal/GU negative Renal ROS     Musculoskeletal  (+) Arthritis ,    Abdominal   Peds  Hematology negative hematology ROS (+)   Anesthesia Other Findings   Reproductive/Obstetrics                             Anesthesia Physical Anesthesia Plan  ASA: 3  Anesthesia Plan: General   Post-op Pain Management: Minimal or no pain anticipated   Induction: Intravenous, Rapid sequence and Cricoid pressure planned  PONV Risk Score and Plan: 2 and Ondansetron, Dexamethasone and Treatment may vary due to age or medical condition  Airway Management Planned: Oral ETT and Video Laryngoscope Planned  Additional Equipment:   Intra-op Plan:   Post-operative Plan: Extubation in OR  Informed Consent: I have reviewed the patients History and Physical, chart, labs and discussed the procedure including the risks, benefits and alternatives for the proposed anesthesia with the patient or authorized representative who has indicated his/her understanding and acceptance.     Dental advisory given  Plan Discussed with: CRNA  Anesthesia Plan Comments:         Anesthesia Quick Evaluation

## 2023-05-10 NOTE — Progress Notes (Signed)
PROGRESS NOTE    Charles Lawson  ZOX:[id number] DOB: 1940-11-14 DOA: 05/08/2023 PCP: Corwin Levins, MD    Brief Narrative:  82 year old with history of HLD comes to the hospital with complaints of epigastric pain ongoing for 2 weeks.  Had outpatient ultrasound on Thursday which was negative.  In the ED CT showed concerns of significant duodenitis causing luminal narrowing.  [facility] was consulted by EDP.  NG tube was placed for decompression of his stomach, planning on endoscopy later today.     Assessment & Plan:  Principal Problem:   Duodenitis Active Problems:   Thrombocytopenia (HCC)   Abdominal pain secondary to duodenitis and concerns of gastric outlet obstruction - Unclear what this is inflammation versus inflammatory from ulcer versus mass versus infection.  There is some luminal changes seen on the CT scan.  Seen by [facility], NG tube placed for decompression, planning for endoscopy   Thrombocytopenia - Appears to have resolved.  Platelets are normal this morning     DVT prophylaxis: SCDs Code Status: Full code Family Communication:   Status is: Inpatient Remains inpatient appropriate because: Continue hospital stay for GI evaluation               Diet Orders (From admission, onward)     Start     Ordered   05/09/23 1102  Diet NPO time specified  Diet effective now        05/09/23 1101            Subjective:  No new complaints.  Tolerating NG tube  Examination:  General exam: Appears calm and comfortable, NG tube in place Respiratory system: Clear to auscultation. Respiratory effort normal. Cardiovascular system: S1 & S2 heard, RRR. No JVD, murmurs, rubs, gallops or clicks. No pedal edema. Gastrointestinal system: Abdomen is nondistended, soft and nontender. No organomegaly or masses felt. Normal bowel sounds heard. Central nervous system: Alert and oriented. No focal neurological deficits. Extremities: Symmetric 5 x 5 power. Skin: No rashes, lesions  or ulcers Psychiatry: Judgement and insight appear normal. Mood & affect appropriate.  Objective: Vitals:   05/09/23 0637 05/09/23 1345 05/09/23 2023 05/10/23 0519  BP: (!) 146/79 (!) 155/73 (!) 146/76 (!) 147/76  Pulse: 66 65 66 74  Resp: 20 18 16 17   Temp: 97.7 F (36.5 C) 97.8 F (36.6 C) 97.9 F (36.6 C) 97.9 F (36.6 C)  TempSrc: Oral Oral Oral Oral  SpO2: 97% 96% 99% 97%  Weight:      Height:        Intake/Output Summary (Last 24 hours) at 05/10/2023 1119 Last data filed at 05/10/2023 0914 Gross per 24 hour  Intake 1985.83 ml  Output 1475 ml  Net 510.83 ml   Filed Weights   05/08/23 1115  Weight: 90.7 kg    Scheduled Meds:  pantoprazole (PROTONIX) IV  40 mg Intravenous Q12H   Continuous Infusions:  lactated ringers 100 mL/hr at 05/10/23 0225    Nutritional status     Body mass index is 27.12 kg/m.  Data Reviewed:   CBC: Recent Labs  Lab 05/08/23 1122 05/09/23 0453 05/10/23 0432  WBC 7.9 9.0 14.0*  HGB 17.5* 16.4 16.8  HCT 50.3 47.5 48.5  MCV 85.5 87.6 87.4  PLT 96* 201 203   Basic Metabolic Panel: Recent Labs  Lab 05/08/23 1245 05/09/23 0453 05/10/23 0432  NA 138 137 137  K 3.6 4.3 4.3  CL 103 105 103  CO2 22 22 24   GLUCOSE 94  103* 98  BUN 13 13 11   CREATININE 1.03 1.05 1.06  CALCIUM 8.9 8.8* 8.7*  MG  --   --  2.3  PHOS  --   --  3.5   GFR: Estimated Creatinine Clearance: 62 mL/min (by C-G formula based on SCr of 1.06 mg/dL). Liver Function Tests: Recent Labs  Lab 05/08/23 1245  AST 21  ALT 29  ALKPHOS 47  BILITOT 1.1  PROT 7.0  ALBUMIN 3.9   Recent Labs  Lab 05/08/23 1122  LIPASE 48   No results for input(s): "AMMONIA" in the last 168 hours. Coagulation Profile: No results for input(s): "INR", "PROTIME" in the last 168 hours. Cardiac Enzymes: No results for input(s): "CKTOTAL", "CKMB", "CKMBINDEX", "TROPONINI" in the last 168 hours. BNP (last 3 results) No results for input(s): "PROBNP" in the last 8760  hours. HbA1C: No results for input(s): "HGBA1C" in the last 72 hours. CBG: No results for input(s): "GLUCAP" in the last 168 hours. Lipid Profile: No results for input(s): "CHOL", "HDL", "LDLCALC", "TRIG", "CHOLHDL", "LDLDIRECT" in the last 72 hours. Thyroid Function Tests: No results for input(s): "TSH", "T4TOTAL", "FREET4", "T3FREE", "THYROIDAB" in the last 72 hours. Anemia Panel: No results for input(s): "VITAMINB12", "FOLATE", "FERRITIN", "TIBC", "IRON", "RETICCTPCT" in the last 72 hours. Sepsis Labs: No results for input(s): "PROCALCITON", "LATICACIDVEN" in the last 168 hours.  No results found for this or any previous visit (from the past 240 hour(s)).       Radiology Studies: DG Abd 1 View  Result Date: 05/09/2023 CLINICAL DATA:  NG tube placement. EXAM: ABDOMEN - 1 VIEW COMPARISON:  None Available. FINDINGS: NG tube tip is in the upper stomach. Proximal side port of the NG tube is above the GE junction. NG tube could be advanced approximately 5 cm to place the side port below the GE junction. Prominent gastric bubble noted with diffuse gas in the colon. IMPRESSION: NG tube tip is in the upper stomach with proximal side port above the GE junction. NG tube could be advanced approximately 5 cm to place the side port below the GE junction. Electronically Signed   By: Kennith Center M.D.   On: 05/09/2023 12:46   CT ABDOMEN PELVIS W CONTRAST  Result Date: 05/08/2023 CLINICAL DATA:  Upper abdominal pain for two weeks. Patient [place] on Thursday and he stated that was negative. N/V/D over the last few weeks. No fever. EXAM: CT ABDOMEN AND PELVIS WITH CONTRAST TECHNIQUE: Multidetector CT imaging of the abdomen and pelvis was performed using the standard protocol following bolus administration of intravenous contrast. RADIATION DOSE REDUCTION: This exam was performed according to the departmental dose-optimization program which includes automated exposure control, adjustment of the mA and/or kV  according to patient size and/or use of iterative reconstruction technique. CONTRAST:  OMNIPAQUE IOHEXOL 300 MG/ML  SOLN COMPARISON:  Abdominal sonogram 05/06/2023 FINDINGS: Lower chest: Scarring noted within both lung bases and lingula. No pleural effusion or consolidative change. Hepatobiliary: No focal liver abnormality is seen. No gallstones, gallbladder wall thickening, or biliary dilatation. Pancreas: Unremarkable. No pancreatic ductal dilatation or surrounding inflammatory changes. Spleen: Normal in size without focal abnormality. Adrenals/Urinary Tract: Adrenal glands are unremarkable. Kidneys are normal, without renal calculi, focal lesion, or hydronephrosis. Bladder is unremarkable. Stomach/Bowel: There is moderate diffuse distension of the stomach and pylorus. Abrupt caliber change with luminal narrowing is noted involving the proximal portion of the descending duodenum with mild surrounding soft tissue stranding and mucosal enhancement, image 50/5. Nonspecific. No pathologic dilatation of the  large or small bowel loops. The appendix is not visualized and may be surgically absent. There are no secondary signs of acute appendicitis. Sigmoid diverticulosis without signs of acute diverticulitis. Vascular/Lymphatic: Aortic atherosclerosis. No enlarged abdominal or pelvic lymph nodes. Reproductive: Prostate gland enlargement. Other: No free fluid or fluid collections. Fat containing left inguinal hernia. Musculoskeletal: No acute or significant osseous findings. Multilevel degenerative disc disease. No acute or suspicious osseous findings. IMPRESSION: 1. There is moderate diffuse distension of the stomach and pylorus. Abrupt caliber change with luminal narrowing is noted involving the proximal portion of the descending duodenum with mild surrounding soft tissue stranding and mucosal enhancement. Findings are nonspecific and may represent a focal area of duodenitis. Underlying neoplasm cannot be excluded.  Consider further evaluation with endoscopy. 2. Sigmoid diverticulosis without signs of acute diverticulitis. 3. Prostate gland enlargement. 4. Fat containing left inguinal hernia. 5.  Aortic Atherosclerosis (ICD10-I70.0). Electronically Signed   By: Signa Kell M.D.   On: 05/08/2023 14:56           LOS: 2 days   Time spent= 35 mins    [name], MD [facility]  If 7PM-7AM, please contact night-coverage  05/10/2023, 11:19 AM

## 2023-05-10 NOTE — Transfer of Care (Signed)
Immediate Anesthesia Transfer of Care Note  Patient: Charles Lawson  Procedure(s) Performed: ESOPHAGOGASTRODUODENOSCOPY (EGD) WITH PROPOFOL BIOPSY  Patient Location: PACU  Anesthesia Type:General  Level of Consciousness: sedated  Airway & Oxygen Therapy: Patient Spontanous Breathing and Patient connected to face mask oxygen  Post-op Assessment: Report given to RN and Post -op Vital signs reviewed and stable  Post vital signs: Reviewed and stable  Last Vitals:  Vitals Value Taken Time  BP 167/71 05/10/23 1422  Temp    Pulse 86 05/10/23 1424  Resp 16 05/10/23 1424  SpO2 100 % 05/10/23 1424  Vitals shown include unfiled device data.  Last Pain:  Vitals:   05/10/23 1312  TempSrc: Temporal  PainSc: 0-No pain      Patients Stated Pain Goal: 1 (05/09/23 2032)  Complications: No notable events documented.

## 2023-05-10 NOTE — Hospital Course (Addendum)
Brief Narrative:  82 year old with history of HLD comes to the hospital with complaints of epigastric pain ongoing for 2 weeks.  Had outpatient ultrasound on Thursday which was negative.  In the ED CT scan showed nonspecific inflammation/duodenitis with evidence of gastric outlet obstruction.  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 pylori gastritis.  Patient underwent repeat endoscopy on 8/21 which showed persistent gastritis and acquired duodenal stenosis.  After further extensive discussion with GI, general surgery and hepatobiliary team, it was decided to transition patient to a higher level of care facility where patient can receive appropriate services in more expedited manner.  Case discussed with [facility] providers who have graciously accepted the patient for further management.  Patient and family aware of this.  Currently patient has NG tube in place to low intermittent suction.  Plan was to place PICC line and start TPN in the meantime later today but to avoid any further delay, will transition patient soon.    Assessment & Plan:  Principal Problem:   Duodenitis Active Problems:   Thrombocytopenia (HCC)   Abdominal pain secondary to duodenitis/stricture causing gastric outlet obstruction - CT scan showed nonspecific inflammation/duodenitis.  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  Stricture shows tubular adenoma and concerns of H. pylori gastritis.  H. pylori treatment per GI team.  Repeat endoscopy on 8/21 showed persisting gastritis, acquired duodenal stenosis.  Further plans per GI and general surgery team. For now start place PICC, start TPN, and NG tube to suction.  Plan was to place PICC line and start TPN today but seems like patient will likely have a bed today for the transfer.  Would not delay  transfer as PICC line and TPN can be arranged at the receiving facility as well. -Diet per GI.  CA 19 9 = 48  Transfer calls: [facility]: Declined by Dr Milbert Coulter due to capacity [facility]: Declined by Dr Danae Chen due to capacity.  [facility]: Dr Ebony Cargo requested to call back 8/24, to discuss with Hepatobiliary team.  Discussed with Dr. Pricilla Riffle from GI, who will see the patient but would like me to speak with the Surg Onc so ptn can be placed on that service. Awaiting call back.   Addendum 823 2:15 PM Patient has been accepted to [facility] by Dr. Rae Mar for further management.   Thrombocytopenia - Normal   DVT prophylaxis: SCDs Code Status: Full code Family Communication: Spouse At bedside.  Status is: Inpatient Remains inpatient appropriate because: Transferred to [facility]

## 2023-05-11 DIAGNOSIS — K2980 Duodenitis without bleeding: Secondary | ICD-10-CM | POA: Diagnosis not present

## 2023-05-11 LAB — BASIC METABOLIC PANEL
Anion gap: 11 (ref 5–15)
BUN: 11 mg/dL (ref 8–23)
CO2: 22 mmol/L (ref 22–32)
Calcium: 8.7 mg/dL — ABNORMAL LOW (ref 8.9–10.3)
Chloride: 103 mmol/L (ref 98–111)
Creatinine, Ser: 0.94 mg/dL (ref 0.61–1.24)
GFR, Estimated: >60 mL/min (ref >60)
Glucose, Bld: 114 mg/dL — ABNORMAL HIGH (ref 70–99)
Potassium: 4.1 mmol/L (ref 3.5–5.1)
Sodium: 136 mmol/L (ref 135–145)

## 2023-05-11 LAB — CBC
HCT: 45.5 % (ref 39.0–52.0)
Hemoglobin: 15.6 g/dL (ref 13.0–17.0)
MCH: 29.7 pg (ref 26.0–34.0)
MCHC: 34.3 g/dL (ref 30.0–36.0)
MCV: 86.7 fL (ref 80.0–100.0)
Platelets: 197 10*3/uL (ref 150–400)
RBC: 5.25 MIL/uL (ref 4.22–5.81)
RDW: 12.1 % (ref 11.5–15.5)
WBC: 10.4 10*3/uL (ref 4.0–10.5)
nRBC: 0 % (ref 0.0–0.2)

## 2023-05-11 LAB — SURGICAL PATHOLOGY

## 2023-05-11 LAB — MAGNESIUM: Magnesium: 2.3 mg/dL (ref 1.7–2.4)

## 2023-05-11 NOTE — Anesthesia Postprocedure Evaluation (Signed)
Anesthesia Post Note  Patient: Charles Lawson  Procedure(s) Performed: ESOPHAGOGASTRODUODENOSCOPY (EGD) WITH PROPOFOL BIOPSY     Patient location during evaluation: Endoscopy Anesthesia Type: General Level of consciousness: sedated and patient cooperative Pain management: pain level controlled Vital Signs Assessment: post-procedure vital signs reviewed and stable Respiratory status: spontaneous breathing Cardiovascular status: stable Anesthetic complications: no  No notable events documented.  Last Vitals:  Vitals:   05/10/23 2024 05/11/23 0519  BP: (!) 148/79 (!) 141/75  Pulse: 80 65  Resp: 18 18  Temp: 36.7 C 36.5 C  SpO2: 94% 96%    Last Pain:  Vitals:   05/11/23 0839  TempSrc:   PainSc: 0-No pain                 Lewie Loron

## 2023-05-11 NOTE — H&P (View-Only) (Signed)
Progress Note  Primary GI: Dr. Leone Payor  LOS: 3 days   Chief Complaint: Duodenal mass/H. pylori infection/nausea/vomiting   Subjective   Patient states he feels much better compared to yesterday.  States he had improvement after EGD.  Tolerating ice chips without difficulty.  Would like to move onto Jell-O if possible.  Has not had a need for replacement of NG tube.  Wife at bedside   Objective   Vital signs in last 24 hours: Temp:  [97.7 F (36.5 C)-98 F (36.7 C)] 97.7 F (36.5 C) (08/20 0519) Pulse Rate:  [65-91] 65 (08/20 0519) Resp:  [15-18] 18 (08/20 0519) BP: (141-175)/(70-87) 141/75 (08/20 0519) SpO2:  [94 %-100 %] 96 % (08/20 0519) Last BM Date : 05/08/23 Last BM recorded by nurses in past 5 days No data recorded  General:   male in no acute distress  Heart:  Regular rate and rhythm; no murmurs Pulm: Clear anteriorly; no wheezing Abdomen: soft, nondistended, normal bowel sounds in all quadrants. Nontender without guarding. No organomegaly appreciated. Extremities:  No edema Neurologic:  Alert and  oriented x4;  No focal deficits.  Psych:  Cooperative. Normal mood and affect.  Intake/Output from previous day: 08/19 0701 - 08/20 0700 In: 1459.1 [P.O.:30; I.V.:1429.1] Out: 1950 [Urine:1900; Emesis/NG output:50] Intake/Output this shift: No intake/output data recorded.  Studies/Results: DG Abd Portable 2V  Result Date: 05/10/2023 CLINICAL DATA:  Gastric outlet obstruction EXAM: PORTABLE ABDOMEN - 2 VIEW COMPARISON:  05/09/2023 FINDINGS: Nasogastric tube tip in the stomach body. Gas fills the stomach. Scattered gas in nondistended colon. Small bowel relatively gasless. 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 No significant abnormal air-fluid levels or findings of free intraperitoneal gas. IMPRESSION: 1. Nasogastric tube tip  in the stomach body. The stomach is mildly distended with gas. 2. 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 3. 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 Electronically Signed   By: Gaylyn Rong M.D.   On: 05/10/2023 13:29   DG Abd 1 View  Result Date: 05/09/2023 CLINICAL DATA:  NG tube placement. EXAM: ABDOMEN - 1 VIEW COMPARISON:  None Available. FINDINGS: NG tube tip is in the upper stomach. Proximal side port of the NG tube is above the GE junction. NG tube could be advanced approximately 5 cm to place the side port below the GE junction. Prominent gastric bubble noted with diffuse gas in the colon. IMPRESSION: NG tube tip is in the upper stomach with proximal side port above the GE junction. NG tube could be advanced approximately 5 cm to place the side port below the GE junction. Electronically Signed   By: Kennith Center M.D.   On: 05/09/2023 12:46    Lab Results: Recent Labs    05/09/23 0453 05/10/23 0432 05/11/23 0457  WBC 9.0 14.0* 10.4  HGB 16.4 16.8 15.6  HCT 47.5 48.5 45.5  PLT 201 203 197   BMET Recent Labs    05/09/23 0453 05/10/23 0432 05/11/23 0457  NA 137 137 136  K 4.3 4.3 4.1  CL 105 103 103  CO2 22 24 22   GLUCOSE 103* 98 114*  BUN 13 11 11   CREATININE 1.05 1.06 0.94  CALCIUM 8.8* 8.7* 8.7*   LFT Recent Labs    05/08/23 1245  PROT 7.0  ALBUMIN 3.9  AST 21  ALT 29  ALKPHOS 47  BILITOT 1.1   PT/INR No results for input(s): "LABPROT", "INR" in the last 72 hours.   Scheduled Meds:  pantoprazole (PROTONIX) IV  40 mg Intravenous Q12H   Continuous Infusions:  lactated ringers 100 mL/hr at 05/11/23 0845   lactated ringers        Patient profile:   82 year old male history of hyperlipidemia, BPH, presented for evaluation of epigastric pain, postprandial abdominal bloating, intermittent nausea and vomiting.  CT angio pelvis showed duodenitis/possible  partial outlet obstruction.  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 pylori gastritis.  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  No overt malignant changes, though cannot be excluded.  Acquired deformity in entire stomach-dilated.  Positive H. pylori gastritis. 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 --- Tolerating ice chips, no need for replacement of NG --- Patient will need H. pylori treatment at some point (quadruple therapy).  Though will hold off until he is swallowing better or until discharge when regimen is less likely to be disrupted   Plan:   -Repeat EGD tomorrow - Pending EGD tomorrow may need to consider general surgery consultation - Can do trial of clear liquids today - NPO at midnight  Bayley M McMichael  05/11/2023, 11:01 AM     [facility] Attending   I have taken an interval history, reviewed the chart and examined the patient. I agree with the Advanced Practitioner's note, impression and recommendations with the following additions:  He is tolerating clears.  I am going to rescope to see if I can get a better idea of the extent of the polyp. Still wonder if it could be a cancer.  Iva Boop, MD, [facility] See Loretha Stapler on call - gastroenterology for best contact person 05/11/2023 6:01 PM

## 2023-05-11 NOTE — Progress Notes (Signed)
PROGRESS NOTE    Charles Lawson  ONG:[id number] DOB: 31-Jul-1941 DOA: 05/08/2023 PCP: Corwin Levins, MD    Brief Narrative:  82 year old with history of HLD comes to the hospital with complaints of epigastric pain ongoing for 2 weeks.  Had outpatient ultrasound on Thursday which was negative.  In the ED CT scan showed nonspecific inflammation/duodenitis.  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  Stricture was biopsied, no obvious malignancy was noted.  Continue PPI twice daily.  If nausea vomiting returns, he will need NG tube again.     Assessment & Plan:  Principal Problem:   Duodenitis Active Problems:   Thrombocytopenia (HCC)   Abdominal pain secondary to duodenitis/stricture causing gastric outlet obstruction - CT scan showed nonspecific inflammation/duodenitis.  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  Stricture shows tubular adenoma and concerns of H. pylori gastritis.  H. pylori treatment per GI team.  Tentative plans for possible endoscopy tomorrow -Diet per GI   Thrombocytopenia - Normal     DVT prophylaxis: SCDs Code Status: Full code Family Communication:   Status is: Inpatient Remains inpatient appropriate because: Continue hospital stay for GI evaluation               Diet Orders (From admission, onward)     Start     Ordered   05/11/23 1113  Diet clear liquid Room service appropriate? Yes; Fluid consistency: Thin  Diet effective now       Question Answer Comment  Room service appropriate? Yes   Fluid consistency: Thin      05/11/23 1112            Subjective: Seen at bedside, no new complaints. Discussed with wife over the phone as well.   Examination:  General exam: Appears calm and comfortable  Respiratory system: Clear to auscultation. Respiratory effort normal. Cardiovascular system: S1 & S2 heard, RRR. No JVD,  murmurs, rubs, gallops or clicks. No pedal edema. Gastrointestinal system: Abdomen is nondistended, soft and nontender. No organomegaly or masses felt. Normal bowel sounds heard. Central nervous system: Alert and oriented. No focal neurological deficits. Extremities: Symmetric 5 x 5 power. Skin: No rashes, lesions or ulcers Psychiatry: Judgement and insight appear normal. Mood & affect appropriate.  Objective: Vitals:   05/10/23 1440 05/10/23 1455 05/10/23 2024 05/11/23 0519  BP: (!) 151/77 (!) 150/87 (!) 148/79 (!) 141/75  Pulse: 78 72 80 65  Resp: 15 17 18 18   Temp:  98 F (36.7 C) 98 F (36.7 C) 97.7 F (36.5 C)  TempSrc:  Oral Oral Oral  SpO2: 94% 99% 94% 96%  Weight:      Height:        Intake/Output Summary (Last 24 hours) at 05/11/2023 1234 Last data filed at 05/11/2023 1000 Gross per 24 hour  Intake 1135.83 ml  Output 1450 ml  Net -314.17 ml   Filed Weights   05/08/23 1115  Weight: 90.7 kg    Scheduled Meds:  pantoprazole (PROTONIX) IV  40 mg Intravenous Q12H   Continuous Infusions:  lactated ringers 100 mL/hr at 05/11/23 0845   lactated ringers      Nutritional status     Body mass index is 27.12 kg/m.  Data Reviewed:   CBC: Recent Labs  Lab 05/08/23 1122 05/09/23 0453 05/10/23 0432 05/11/23 0457  WBC 7.9 9.0 14.0* 10.4  HGB 17.5* 16.4 16.8 15.6  HCT 50.3 47.5 48.5 45.5  MCV 85.5  87.6 87.4 86.7  PLT 96* 201 203 197   Basic Metabolic Panel: Recent Labs  Lab 05/08/23 1245 05/09/23 0453 05/10/23 0432 05/11/23 0457  NA 138 137 137 136  K 3.6 4.3 4.3 4.1  CL 103 105 103 103  CO2 22 22 24 22   GLUCOSE 94 103* 98 114*  BUN 13 13 11 11   CREATININE 1.03 1.05 1.06 0.94  CALCIUM 8.9 8.8* 8.7* 8.7*  MG  --   --  2.3 2.3  PHOS  --   --  3.5  --    GFR: Estimated Creatinine Clearance: 69.9 mL/min (by C-G formula based on SCr of 0.94 mg/dL). Liver Function Tests: Recent Labs  Lab 05/08/23 1245  AST 21  ALT 29  ALKPHOS 47  BILITOT 1.1   PROT 7.0  ALBUMIN 3.9   Recent Labs  Lab 05/08/23 1122  LIPASE 48   No results for input(s): "AMMONIA" in the last 168 hours. Coagulation Profile: No results for input(s): "INR", "PROTIME" in the last 168 hours. Cardiac Enzymes: No results for input(s): "CKTOTAL", "CKMB", "CKMBINDEX", "TROPONINI" in the last 168 hours. BNP (last 3 results) No results for input(s): "PROBNP" in the last 8760 hours. HbA1C: No results for input(s): "HGBA1C" in the last 72 hours. CBG: No results for input(s): "GLUCAP" in the last 168 hours. Lipid Profile: No results for input(s): "CHOL", "HDL", "LDLCALC", "TRIG", "CHOLHDL", "LDLDIRECT" in the last 72 hours. Thyroid Function Tests: No results for input(s): "TSH", "T4TOTAL", "FREET4", "T3FREE", "THYROIDAB" in the last 72 hours. Anemia Panel: No results for input(s): "VITAMINB12", "FOLATE", "FERRITIN", "TIBC", "IRON", "RETICCTPCT" in the last 72 hours. Sepsis Labs: No results for input(s): "PROCALCITON", "LATICACIDVEN" in the last 168 hours.  No results found for this or any previous visit (from the past 240 hour(s)).       Radiology Studies: DG Abd Portable 2V  Result Date: 05/10/2023 CLINICAL DATA:  Gastric outlet obstruction EXAM: PORTABLE ABDOMEN - 2 VIEW COMPARISON:  05/09/2023 FINDINGS: Nasogastric tube tip in the stomach body. Gas fills the stomach. Scattered gas in nondistended colon. Small bowel relatively gasless. 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 No significant abnormal air-fluid levels or findings of free intraperitoneal gas. IMPRESSION: 1. Nasogastric tube tip in the stomach body. The stomach is mildly distended with gas. 2. 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 3. Lower thoracic and lumbar spondylosis and degenerative  disc disease. Electronically Signed   By: Gaylyn Rong M.D.   On: 05/10/2023 13:29   DG Abd 1 View  Result Date: 05/09/2023 CLINICAL DATA:  NG tube placement. EXAM: ABDOMEN - 1 VIEW COMPARISON:  None Available. FINDINGS: NG tube tip is in the upper stomach. Proximal side port of the NG tube is above the GE junction. NG tube could be advanced approximately 5 cm to place the side port below the GE junction. Prominent gastric bubble noted with diffuse gas in the colon. IMPRESSION: NG tube tip is in the upper stomach with proximal side port above the GE junction. NG tube could be advanced approximately 5 cm to place the side port below the GE junction. Electronically Signed   By: Kennith Center M.D.   On: 05/09/2023 12:46           LOS: 3 days   Time spent= 35 mins    Natina Wiginton Zoila Shutter, MD [facility]  If 7PM-7AM,  please contact night-coverage  05/11/2023, 12:34 PM

## 2023-05-11 NOTE — Plan of Care (Signed)
  Problem: Activity: Goal: Risk for activity intolerance will decrease Outcome: Adequate for Discharge   Problem: Nutrition: Goal: Adequate nutrition will be maintained Outcome: Adequate for Discharge

## 2023-05-11 NOTE — Progress Notes (Addendum)
Progress Note  Primary GI: Dr. Leone Payor  LOS: 3 days   Chief Complaint: Duodenal mass/H. pylori infection/nausea/vomiting   Subjective   Patient states he feels much better compared to yesterday.  States he had improvement after EGD.  Tolerating ice chips without difficulty.  Would like to move onto Jell-O if possible.  Has not had a need for replacement of NG tube.  Wife at bedside   Objective   Vital signs in last 24 hours: Temp:  [97.7 F (36.5 C)-98 F (36.7 C)] 97.7 F (36.5 C) (08/20 0519) Pulse Rate:  [65-91] 65 (08/20 0519) Resp:  [15-18] 18 (08/20 0519) BP: (141-175)/(70-87) 141/75 (08/20 0519) SpO2:  [94 %-100 %] 96 % (08/20 0519) Last BM Date : 05/08/23 Last BM recorded by nurses in past 5 days No data recorded  General:   male in no acute distress  Heart:  Regular rate and rhythm; no murmurs Pulm: Clear anteriorly; no wheezing Abdomen: soft, nondistended, normal bowel sounds in all quadrants. Nontender without guarding. No organomegaly appreciated. Extremities:  No edema Neurologic:  Alert and  oriented x4;  No focal deficits.  Psych:  Cooperative. Normal mood and affect.  Intake/Output from previous day: 08/19 0701 - 08/20 0700 In: 1459.1 [P.O.:30; I.V.:1429.1] Out: 1950 [Urine:1900; Emesis/NG output:50] Intake/Output this shift: No intake/output data recorded.  Studies/Results: DG Abd Portable 2V  Result Date: 05/10/2023 CLINICAL DATA:  Gastric outlet obstruction EXAM: PORTABLE ABDOMEN - 2 VIEW COMPARISON:  05/09/2023 FINDINGS: Nasogastric tube tip in the stomach body. Gas fills the stomach. Scattered gas in nondistended colon. Small bowel relatively gasless. 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 No significant abnormal air-fluid levels or findings of free intraperitoneal gas. IMPRESSION: 1. Nasogastric tube tip  in the stomach body. The stomach is mildly distended with gas. 2. 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 3. 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 Electronically Signed   By: Gaylyn Rong M.D.   On: 05/10/2023 13:29   DG Abd 1 View  Result Date: 05/09/2023 CLINICAL DATA:  NG tube placement. EXAM: ABDOMEN - 1 VIEW COMPARISON:  None Available. FINDINGS: NG tube tip is in the upper stomach. Proximal side port of the NG tube is above the GE junction. NG tube could be advanced approximately 5 cm to place the side port below the GE junction. Prominent gastric bubble noted with diffuse gas in the colon. IMPRESSION: NG tube tip is in the upper stomach with proximal side port above the GE junction. NG tube could be advanced approximately 5 cm to place the side port below the GE junction. Electronically Signed   By: Kennith Center M.D.   On: 05/09/2023 12:46    Lab Results: Recent Labs    05/09/23 0453 05/10/23 0432 05/11/23 0457  WBC 9.0 14.0* 10.4  HGB 16.4 16.8 15.6  HCT 47.5 48.5 45.5  PLT 201 203 197   BMET Recent Labs    05/09/23 0453 05/10/23 0432 05/11/23 0457  NA 137 137 136  K 4.3 4.3 4.1  CL 105 103 103  CO2 22 24 22   GLUCOSE 103* 98 114*  BUN 13 11 11   CREATININE 1.05 1.06 0.94  CALCIUM 8.8* 8.7* 8.7*   LFT Recent Labs    05/08/23 1245  PROT 7.0  ALBUMIN 3.9  AST 21  ALT 29  ALKPHOS 47  BILITOT 1.1   PT/INR No results for input(s): "LABPROT", "INR" in the last 72 hours.   Scheduled Meds:  pantoprazole (PROTONIX) IV  40 mg Intravenous Q12H   Continuous Infusions:  lactated ringers 100 mL/hr at 05/11/23 0845   lactated ringers        Patient profile:   82 year old male history of hyperlipidemia, BPH, presented for evaluation of epigastric pain, postprandial abdominal bloating, intermittent nausea and vomiting.  CT angio pelvis showed duodenitis/possible  partial outlet obstruction.  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 pylori gastritis.  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  No overt malignant changes, though cannot be excluded.  Acquired deformity in entire stomach-dilated.  Positive H. pylori gastritis. 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 --- Tolerating ice chips, no need for replacement of NG --- Patient will need H. pylori treatment at some point (quadruple therapy).  Though will hold off until he is swallowing better or until discharge when regimen is less likely to be disrupted   Plan:   -Repeat EGD tomorrow - Pending EGD tomorrow may need to consider general surgery consultation - Can do trial of clear liquids today - NPO at midnight  Bayley M McMichael  05/11/2023, 11:01 AM     [facility] Attending   I have taken an interval history, reviewed the chart and examined the patient. I agree with the Advanced Practitioner's note, impression and recommendations with the following additions:  He is tolerating clears.  I am going to rescope to see if I can get a better idea of the extent of the polyp. Still wonder if it could be a cancer.  Iva Boop, MD, [facility] See Loretha Stapler on call - gastroenterology for best contact person 05/11/2023 6:01 PM

## 2023-05-11 NOTE — Progress Notes (Signed)
Mobility Specialist - Progress Note   05/11/23 0907  Mobility  Activity Ambulated with assistance in hallway  Level of Assistance Independent after set-up  Assistive Device Other (Comment) (IV Pole)  Distance Ambulated (ft) 500 ft  Activity Response Tolerated well  Mobility Referral Yes  $Mobility charge 1 Mobility  Mobility Specialist Start Time (ACUTE ONLY) 0857  Mobility Specialist Stop Time (ACUTE ONLY) 0906  Mobility Specialist Time Calculation (min) (ACUTE ONLY) 9 min   Pt received in bed and agreeable to mobility. No complaints during session. Pt to bed after session with all needs met.     [facility]

## 2023-05-12 ENCOUNTER — Inpatient Hospital Stay (HOSPITAL_COMMUNITY): Payer: Medicare Other | Admitting: Certified Registered Nurse Anesthetist

## 2023-05-12 ENCOUNTER — Encounter (HOSPITAL_COMMUNITY)
Admission: EM | Disposition: A | Discharge status: Other Acute Inpatient Hospital | Payer: Self-pay | Source: Home / Self Care | Attending: Internal Medicine

## 2023-05-12 ENCOUNTER — Encounter (HOSPITAL_COMMUNITY): Payer: Self-pay | Admitting: Family Medicine

## 2023-05-12 DIAGNOSIS — K317 Polyp of stomach and duodenum: Secondary | ICD-10-CM | POA: Diagnosis not present

## 2023-05-12 DIAGNOSIS — K2970 Gastritis, unspecified, without bleeding: Secondary | ICD-10-CM

## 2023-05-12 DIAGNOSIS — K2980 Duodenitis without bleeding: Secondary | ICD-10-CM | POA: Diagnosis not present

## 2023-05-12 DIAGNOSIS — E785 Hyperlipidemia, unspecified: Secondary | ICD-10-CM

## 2023-05-12 DIAGNOSIS — I4510 Unspecified right bundle-branch block: Secondary | ICD-10-CM | POA: Diagnosis not present

## 2023-05-12 DIAGNOSIS — K315 Obstruction of duodenum: Secondary | ICD-10-CM

## 2023-05-12 HISTORY — PX: BIOPSY: SHX5522

## 2023-05-12 HISTORY — PX: ESOPHAGOGASTRODUODENOSCOPY: SHX5428

## 2023-05-12 LAB — BASIC METABOLIC PANEL
Anion gap: 10 (ref 5–15)
BUN: 12 mg/dL (ref 8–23)
CO2: 26 mmol/L (ref 22–32)
Calcium: 9 mg/dL (ref 8.9–10.3)
Chloride: 103 mmol/L (ref 98–111)
Creatinine, Ser: 1 mg/dL (ref 0.61–1.24)
GFR, Estimated: >60 mL/min (ref >60)
Glucose, Bld: 113 mg/dL — ABNORMAL HIGH (ref 70–99)
Potassium: 4.4 mmol/L (ref 3.5–5.1)
Sodium: 139 mmol/L (ref 135–145)

## 2023-05-12 LAB — CBC
HCT: 45.3 % (ref 39.0–52.0)
Hemoglobin: 15.8 g/dL (ref 13.0–17.0)
MCH: 30.3 pg (ref 26.0–34.0)
MCHC: 34.9 g/dL (ref 30.0–36.0)
MCV: 86.8 fL (ref 80.0–100.0)
Platelets: 179 10*3/uL (ref 150–400)
RBC: 5.22 MIL/uL (ref 4.22–5.81)
RDW: 12.4 % (ref 11.5–15.5)
WBC: 9.9 10*3/uL (ref 4.0–10.5)
nRBC: 0 % (ref 0.0–0.2)

## 2023-05-12 LAB — MAGNESIUM: Magnesium: 2.3 mg/dL (ref 1.7–2.4)

## 2023-05-12 SURGERY — EGD (ESOPHAGOGASTRODUODENOSCOPY)
Anesthesia: General

## 2023-05-12 MED ORDER — PROPOFOL 500 MG/50ML IV EMUL
INTRAVENOUS | Status: DC | PRN
  Administered 2023-05-12: 125 ug/kg/min via INTRAVENOUS

## 2023-05-12 MED ORDER — PROPOFOL 10 MG/ML IV BOLUS
INTRAVENOUS | Status: DC | PRN
  Administered 2023-05-12: 40 mg via INTRAVENOUS
  Administered 2023-05-12: 20 mg via INTRAVENOUS

## 2023-05-12 MED ORDER — ONDANSETRON HCL 4 MG/2ML IJ SOLN
INTRAMUSCULAR | Status: DC | PRN
Start: 2023-05-12 — End: 2023-05-12
  Administered 2023-05-12: 4 mg via INTRAVENOUS

## 2023-05-12 MED ORDER — SODIUM CHLORIDE 0.9 % IV SOLN: INTRAVENOUS | Status: DC

## 2023-05-12 MED ORDER — LIDOCAINE 2% (20 MG/ML) 5 ML SYRINGE
INTRAMUSCULAR | Status: DC | PRN
  Administered 2023-05-12: 50 mg via INTRAVENOUS

## 2023-05-12 MED ORDER — PROPOFOL 1000 MG/100ML IV EMUL
INTRAVENOUS | Status: AC
  Filled 2023-05-12: qty 100

## 2023-05-12 MED ORDER — LACTATED RINGERS IV SOLN: INTRAVENOUS | Status: DC

## 2023-05-12 NOTE — Care Management Important Message (Signed)
Important Message  Patient Details IM Letter given. Name: Charles Lawson MRN: [id number] Date of Birth: 1940-11-26   Medicare Important Message Given:  Yes     Caren Macadam 05/12/2023, 2:53 PM

## 2023-05-12 NOTE — Op Note (Addendum)
[facility] Patient Name: Charles Lawson Procedure Date: 05/12/2023 MRN: [id number] Attending MD: Iva Boop , MD, [id number] Date of Birth: 1940-11-04 CSN: [id number] Age: 82 Admit Type: Inpatient Procedure:                Upper GI endoscopy Indications:              Polyps in the duodenum - gastric outlet obstruction                            - prior biopsy adenoma 2 d ago - reassess Providers:                Iva Boop, MD, Martha Clan, RN,                            Sunday Corn Mbumina, Technician Referring MD:              Medicines:                Monitored Anesthesia Care Complications:            No immediate complications. Estimated Blood Loss:     Estimated blood loss was minimal. Procedure:                Pre-Anesthesia Assessment:                           - Prior to the procedure, a History and Physical                            was performed, and patient medications and                            allergies were reviewed. The patient's tolerance of                            previous anesthesia was also reviewed. The risks                            and benefits of the procedure and the sedation                            options and risks were discussed with the patient.                            All questions were answered, and informed consent                            was obtained. Prior Anticoagulants: The patient has                            taken no anticoagulant or antiplatelet agents. ASA                            Grade Assessment: III - A patient with severe  systemic disease. After reviewing the risks and                            benefits, the patient was deemed in satisfactory                            condition to undergo the procedure.                           After obtaining informed consent, the endoscope was                            passed under direct vision. Throughout the                             procedure, the patient's blood pressure, pulse, and                            oxygen saturations were monitored continuously. The                            GIF-H190 ([id number]) Olympus endoscope was introduced                            through the mouth, and advanced to the second part                            of duodenum. The upper GI endoscopy was                            accomplished without difficulty. The patient                            tolerated the procedure well. Scope In: Scope Out: Findings:      An acquired severe stenosis was found in the second portion of the       duodenum and was partially traversed. Biopsies were taken with a cold       forceps for histology. Verification of patient identification for the       specimen was done. Estimated blood loss was minimal.      Diffuse moderate inflammation was found in the entire examined stomach.      Food (residue) was found in the gastric fundus.      Food (residue) was found in the first portion of the duodenum.      The exam was otherwise without abnormality. Impression:               - Acquired duodenal stenosis. Biopsied. This is                            where duodenal adenoma was found with biopsies 2                            days ago - the stenosis/stricture allows passage of  adult gastroscope further than before but could not                            pass through.                           - Gastritis. Known H pylori gastritis - not yet                            treated.                           - Food (residue) in the stomach. Liquid and some                            roughage as before. I did suction 2-300 cc fluid.                           - Retained food in the duodenum. A few pieces of                            roughage. Moderate Sedation:      Not Applicable - Patient had care per Anesthesia. Recommendation:           - Return patient to hospital ward for  ongoing care.                           - Clear liquid diet. he has been tolerating this so                            will continue but await surgery input re: ? replace                            NG tube.                           - needs GSU consult as I suspect he will need                            surgery - ? is when                           - need to consider EUS also - will discuss w/                            colleague                           - I do not think treating H pylori would change                            things right now and have held off to make sure he  is able to tolerate the regimen. Procedure Code(s):        --- Professional ---                           [phone], Esophagogastroduodenoscopy, flexible,                            transoral; with biopsy, single or multiple Diagnosis Code(s):        --- Professional ---                           K31.5, Obstruction of duodenum                           K29.70, Gastritis, unspecified, without bleeding                           K31.7, Polyp of stomach and duodenum CPT copyright 2022 [employer]. All rights reserved. The codes documented in this report are preliminary and upon coder review may  be revised to meet current compliance requirements. Iva Boop, MD 05/12/2023 1:07:12 PM This report has been signed electronically. Number of Addenda: 0

## 2023-05-12 NOTE — Plan of Care (Signed)
  Problem: Education: Goal: Knowledge of General Education information will improve Description Including pain rating scale, medication(s)/side effects and non-pharmacologic comfort measures Outcome: Progressing   

## 2023-05-12 NOTE — Progress Notes (Signed)
Mobility Specialist - Progress Note   05/12/23 0842  Mobility  Activity Ambulated with assistance in hallway  Level of Assistance Independent after set-up  Assistive Device Other (Comment) (IV Pole)  Distance Ambulated (ft) 500 ft  Activity Response Tolerated well  Mobility Referral Yes  $Mobility charge 1 Mobility  Mobility Specialist Start Time (ACUTE ONLY) [id number]  Mobility Specialist Stop Time (ACUTE ONLY) [id number]  Mobility Specialist Time Calculation (min) (ACUTE ONLY) 9 min   Pt received EOB and agreeable to mobility. No complaints during session. Pt to EOB after session with all needs met.     [facility]

## 2023-05-12 NOTE — Anesthesia Procedure Notes (Signed)
Procedure Name: MAC Date/Time: 05/12/2023 12:38 PM  Performed by: Orest Dikes, CRNAPre-anesthesia Checklist: Patient identified, Emergency Drugs available, Suction available and Patient being monitored Oxygen Delivery Method: Simple face mask

## 2023-05-12 NOTE — Anesthesia Postprocedure Evaluation (Signed)
Anesthesia Post Note  Patient: Charles Lawson  Procedure(s) Performed: ESOPHAGOGASTRODUODENOSCOPY (EGD) POLYPECTOMY     Patient location during evaluation: PACU Anesthesia Type: MAC Level of consciousness: awake Pain management: pain level controlled Vital Signs Assessment: post-procedure vital signs reviewed and stable Respiratory status: spontaneous breathing, nonlabored ventilation and respiratory function stable Cardiovascular status: stable and blood pressure returned to baseline Postop Assessment: no apparent nausea or vomiting Anesthetic complications: no   No notable events documented.  Last Vitals:  Vitals:   05/12/23 1308 05/12/23 1318  BP: (!) 148/80 (!) 155/79  Pulse: 72 65  Resp: 18 19  Temp:    SpO2: 97% 97%    Last Pain:  Vitals:   05/12/23 1318  TempSrc:   PainSc: 0-No pain                 Linton Rump

## 2023-05-12 NOTE — Anesthesia Preprocedure Evaluation (Addendum)
Anesthesia Evaluation  Patient identified by MRN, date of birth, ID band Patient awake    Reviewed: Allergy & Precautions, NPO status , Patient's Chart, lab work & pertinent test results  History of Anesthesia Complications Negative for: history of anesthetic complications  Airway Mallampati: III  TM Distance: >3 FB Neck ROM: Full   Comment: Previous grade I view with MAC 4 Dental  (+) Dental Advisory Given   Pulmonary asthma (patient denies)    Pulmonary exam normal breath sounds clear to auscultation       Cardiovascular (-) hypertension(-) angina (-) Past MI, (-) Cardiac Stents and (-) CABG + dysrhythmias (RBBB)  Rhythm:Regular Rate:Normal  HLD   Neuro/Psych neg Seizures    GI/Hepatic Neg liver ROS,GERD  Medicated,,Duodenal mass, diverticulosis   Endo/Other  negative endocrine ROS    Renal/GU negative Renal ROS     Musculoskeletal  (+) Arthritis ,    Abdominal   Peds  Hematology negative hematology ROS (+) Lab Results      Component                Value               Date                      WBC                      9.9                 05/12/2023                HGB                      15.8                05/12/2023                HCT                      45.3                05/12/2023                MCV                      86.8                05/12/2023                PLT                      179                 05/12/2023              Anesthesia Other Findings 82 year old with history of HLD comes to the hospital with complaints of epigastric pain ongoing for 2 weeks.  Had outpatient ultrasound on Thursday which was negative.  In the ED CT scan showed nonspecific inflammation/duodenitis.  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  Stricture was biopsied, no obvious malignancy was noted.  Patient has since had NGT removed, no longer has n/v, and has  been tolerating clear liquid diet.   Reproductive/Obstetrics  Anesthesia Physical Anesthesia Plan  ASA: 3  Anesthesia Plan: MAC   Post-op Pain Management:    Induction: Intravenous  PONV Risk Score and Plan: 1 and Treatment may vary due to age or medical condition, Propofol infusion and TIVA  Airway Management Planned: Natural Airway and Nasal Cannula  Additional Equipment:   Intra-op Plan:   Post-operative Plan: Extubation in OR  Informed Consent: I have reviewed the patients History and Physical, chart, labs and discussed the procedure including the risks, benefits and alternatives for the proposed anesthesia with the patient or authorized representative who has indicated his/her understanding and acceptance.     Dental advisory given  Plan Discussed with: CRNA and Anesthesiologist  Anesthesia Plan Comments: (Risks of general anesthesia discussed including, but not limited to, sore throat, hoarse voice, chipped/damaged teeth, injury to vocal cords, nausea and vomiting, allergic reactions, lung infection, heart attack, stroke, and death. All questions answered. )        Anesthesia Quick Evaluation

## 2023-05-12 NOTE — Transfer of Care (Signed)
Immediate Anesthesia Transfer of Care Note  Patient: Charles Lawson  Procedure(s) Performed: ESOPHAGOGASTRODUODENOSCOPY (EGD) POLYPECTOMY  Patient Location: PACU and Endoscopy Unit  Anesthesia Type:MAC  Level of Consciousness: awake, alert , and oriented  Airway & Oxygen Therapy: Patient Spontanous Breathing and Patient connected to face mask oxygen  Post-op Assessment: Report given to RN and Post -op Vital signs reviewed and stable  Post vital signs: Reviewed and stable  Last Vitals:  Vitals Value Taken Time  BP    Temp    Pulse    Resp    SpO2      Last Pain:  Vitals:   05/12/23 1147  TempSrc: Temporal  PainSc: 0-No pain      Patients Stated Pain Goal: 1 (05/09/23 2032)  Complications: No notable events documented.

## 2023-05-12 NOTE — Consult Note (Signed)
Charles Lawson 1941/02/04  [id number].    Requesting MD: Dr. 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 male with a hx of HLD who presented to the ED with abdominal bloating, burping/belching, nausea and vomiting. Reports he was doing well, tolerating a regular diet, until 1.5 weeks ago when he started having upper abdominal bloating with associated burping/belching after po intake. Symptoms worsened and became constant. Notes one episode of n/v on 8/17. CT showed a moderate diffuse distension of the stomach and pylorus with abrupt caliber change with luminal narrowing noted involving the proximal portion of the descending duodenum. He was admitted to [facility] and GI was consulted. He underwent Endoscopy 8/19 that showed an acquired severe stenosis was found in the second portion of the duodenum and was traversed using the ultraslim gastroscope. 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 Gastric bx positive for H. Pylori. He was able to tolerate chicken broth yesterday without worsening of his abdominal bloating or any n/v. Repeat EGD today, 8/21, showed similar acquired duodenal stenosis with area of stenosis/ stricture allowing passage of adult gastroscope further than before but could not pass through. GI considering EUS as next step in workup. We were asked to see. He denies any frequent NSAID use or tobacco use. No recent weight loss. No personal or family hx of primary GI cancers (reports his father had lung cancer with intra-abdominal mets). Hx of prior R inguinal hernia repair in 2010 by Dr. Dwain Sarna. No other abdominal surgeries.   ROS: ROS As above, see hpi  Family History  Problem Relation Age of Onset   COPD Mother    Cancer Father    Lung cancer Father    Colon cancer Neg Hx    Colon polyps Neg Hx     Rectal cancer Neg Hx    Stomach cancer Neg Hx     Past Medical History:  Diagnosis Date   Allergic rhinitis, cause unspecified    Arthritis    Blood pressure elevated without history of HTN    BPH (benign prostatic hyperplasia)    Diverticulosis of colon    ED (erectile dysfunction)    History of basal cell carcinoma (BCC) excision    Hyperlipidemia    Impaired glucose tolerance 04/03/2013   Personal history of colonic polyps 06/07/2012    Past Surgical History:  Procedure Laterality Date   Quintella Reichert OSTEOTOMY Left 07/19/2020   Procedure: Quintella Reichert OSTEOTOMY;  Surgeon: Edwin Cap, DPM;  Location: [facility] [place];  Service: Podiatry;  Laterality: Left;   BUNIONECTOMY Left 07/19/2020   Procedure: Delos Haring;  Surgeon: Edwin Cap, DPM;  Location: [facility];  Service: Podiatry;  Laterality: Left;   COLONOSCOPY  last one 10-06-2017  dr Leone Payor   [facility] TOE SURGERY Left 07/19/2020   Procedure: HAMMER TOE CORRECTION;  Surgeon: Edwin Cap, DPM;  Location: [facility] [place];  Service: Podiatry;  Laterality: Left;   INGUINAL HERNIA REPAIR Right 11-21-2008  @ [facility]   LUMBAR LAMINECTOMY  1974   METATARSAL OSTEOTOMY Left 07/19/2020   Procedure: METATARSAL OSTEOTOMY;  Surgeon: Edwin Cap, DPM;  Location: [facility] [place];  Service: Podiatry;  Laterality: Left;   ORIF TOE FRACTURE Left 07/19/2020   Procedure: OPEN TREATMENT OF METARSAL PHALANGEAL JOINT DISLOCATION;  Surgeon: Edwin Cap, DPM;  Location:  [facility];  Service: Podiatry;  Laterality: Left;  ANKLE BLOCK WITH THIGH TOURNIQUET    Social History:  reports that he has never smoked. He has never used smokeless tobacco. He reports current alcohol use. He reports that he does not use drugs.  Allergies:  Allergies  Allergen Reactions   Penicillins Other (See Comments)    Unknown childhood reaction    Medications Prior to Admission   Medication Sig Dispense Refill   ondansetron (ZOFRAN) 4 MG tablet Take 1 tablet (4 mg total) by mouth every 8 (eight) hours as needed for nausea or vomiting. (Patient taking differently: Take 4 mg by mouth as needed for nausea or vomiting.) 20 tablet 0   pantoprazole (PROTONIX) 40 MG tablet Take 1 tablet (40 mg total) by mouth daily. 30 tablet 0     Physical Exam: Blood pressure (!) 159/86, pulse 66, temperature 97.7 F (36.5 C), temperature source Temporal, resp. rate 16, height 6' (1.829 m), weight 88.8 kg, SpO2 99%. General: pleasant, WD/WN male who is laying in bed in NAD HEENT: head is normocephalic, atraumatic.  Heart: regular, rate, and rhythm.  Lungs: CTAB, no wheezes, rhonchi, or rales noted.  Respiratory effort nonlabored Abd:  Soft, not obviously distended on exam, NT. +BS. No masses, hernias, or organomegaly. Prior R inguinal repair scar well healed.  MS: no BUE edema Skin: warm and dry Psych: A&Ox4 with an appropriate affect Neuro: normal speech, thought process intact, moves all extremities, gait not assessed   Results for orders placed or performed during the hospital encounter of 05/08/23 (from the past 48 hour(s))  Basic metabolic panel     Status: Abnormal   Collection Time: 05/11/23  4:57 AM  Result Value Ref Range   Sodium 136 135 - 145 mmol/L   Potassium 4.1 3.5 - 5.1 mmol/L   Chloride 103 98 - 111 mmol/L   CO2 22 22 - 32 mmol/L   Glucose, Bld 114 (H) 70 - 99 mg/dL    Comment: Glucose reference range applies only to samples taken after fasting for at least 8 hours.   BUN 11 8 - 23 mg/dL   Creatinine, Ser 1.61 0.61 - 1.24 mg/dL   Calcium 8.7 (L) 8.9 - 10.3 mg/dL   GFR, Estimated >09 >60 mL/min    Comment: (NOTE) Calculated using the CKD-EPI Creatinine Equation (2021)    Anion gap 11 5 - 15    Comment: Performed at [facility], [address] [place], [place] [address]  CBC     Status: None   Collection Time: 05/11/23  4:57 AM  Result  Value Ref Range   WBC 10.4 4.0 - 10.5 K/uL   RBC 5.25 4.22 - 5.81 MIL/uL   Hemoglobin 15.6 13.0 - 17.0 g/dL   HCT 81.1 91.4 - 78.2 %   MCV 86.7 80.0 - 100.0 fL   MCH 29.7 26.0 - 34.0 pg   MCHC 34.3 30.0 - 36.0 g/dL   RDW 95.6 21.3 - 08.6 %   Platelets 197 150 - 400 K/uL   nRBC 0.0 0.0 - 0.2 %    Comment: Performed at [facility], [address] [place], [place] [address]  Magnesium     Status: None   Collection Time: 05/11/23  4:57 AM  Result Value Ref Range   Magnesium 2.3 1.7 - 2.4 mg/dL    Comment: Performed at [facility], [address], [place], [place] [address]  Basic metabolic panel  Status: Abnormal   Collection Time: 05/12/23  4:46 AM  Result Value Ref Range   Sodium 139 135 - 145 mmol/L   Potassium 4.4 3.5 - 5.1 mmol/L   Chloride 103 98 - 111 mmol/L   CO2 26 22 - 32 mmol/L   Glucose, Bld 113 (H) 70 - 99 mg/dL    Comment: Glucose reference range applies only to samples taken after fasting for at least 8 hours.   BUN 12 8 - 23 mg/dL   Creatinine, Ser 5.63 0.61 - 1.24 mg/dL   Calcium 9.0 8.9 - 87.5 mg/dL   GFR, Estimated >64 >33 mL/min    Comment: (NOTE) Calculated using the CKD-EPI Creatinine Equation (2021)    Anion gap 10 5 - 15    Comment: Performed at [facility], [address], [place], [place] [address]  CBC     Status: None   Collection Time: 05/12/23  4:46 AM  Result Value Ref Range   WBC 9.9 4.0 - 10.5 K/uL   RBC 5.22 4.22 - 5.81 MIL/uL   Hemoglobin 15.8 13.0 - 17.0 g/dL   HCT 84.1 66.0 - 63.0 %   MCV 86.8 80.0 - 100.0 fL   MCH 30.3 26.0 - 34.0 pg   MCHC 34.9 30.0 - 36.0 g/dL   RDW 16.0 10.9 - 32.3 %   Platelets 179 150 - 400 K/uL   nRBC 0.0 0.0 - 0.2 %    Comment: Performed at [facility], [address], [place], [place] [address]  Magnesium     Status: None   Collection Time: 05/12/23  4:46 AM  Result Value Ref Range   Magnesium 2.3 1.7 - 2.4 mg/dL     Comment: Performed at [facility], [address] [place], [place] [address]   No results found.  Anti-infectives (From admission, onward)    None       Assessment/Plan Duodenal stenosis/stricture with Gastric Outlet Obstruction H. Pylori Gastritis - No current indication for emergency surgery. We will await further w/u by GI including pending biopsy and EUS. Further recommendations upon these results. Defer tx of H. Pylori and current diet to GI.   I reviewed nursing notes, Consultant (GI) notes, hospitalist notes, last 24 h vitals and pain scores, last 48 h intake and output, last 24 h labs and trends, and last 24 h imaging results.  Jacinto Halim, [facility] 05/12/2023, 2:41 PM Please see Amion for pager number during day hours 7:00am-4:30pm

## 2023-05-12 NOTE — Interval H&P Note (Signed)
History and Physical Interval Note:  05/12/2023 12:34 PM  Charles Lawson  has presented today for surgery, with the diagnosis of duodenal mass, nausea/vomiting/ positive H. Pylori.  The various methods of treatment have been discussed with the patient and family. After consideration of risks, benefits and other options for treatment, the patient has consented to  Procedure(s): ESOPHAGOGASTRODUODENOSCOPY (EGD) (N/A) as a surgical intervention.  The patient's history has been reviewed, patient examined, no change in status, stable for surgery.  I have reviewed the patient's chart and labs.  Questions were answered to the patient's satisfaction.     Stan Head

## 2023-05-12 NOTE — Progress Notes (Signed)
PROGRESS NOTE    Charles Lawson  QVZ:[id number] DOB: 01/08/1941 DOA: 05/08/2023 PCP: Corwin Levins, MD    Brief Narrative:  82 year old with history of HLD comes to the hospital with complaints of epigastric pain ongoing for 2 weeks.  Had outpatient ultrasound on Thursday which was negative.  In the ED CT scan showed nonspecific inflammation/duodenitis.  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 pylori gastritis.  GI will plan on rescoping this patient and eventually started on H. pylori treatment.  In the meantime continue PPI   Assessment & Plan:  Principal Problem:   Duodenitis Active Problems:   Thrombocytopenia (HCC)   Abdominal pain secondary to duodenitis/stricture causing gastric outlet obstruction - CT scan showed nonspecific inflammation/duodenitis.  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  Stricture shows tubular adenoma and concerns of H. pylori gastritis.  H. pylori treatment per GI team.  Tentative plans for possible endoscopy today -Diet per GI   Thrombocytopenia - Normal     DVT prophylaxis: SCDs Code Status: Full code Family Communication: Spouse at bedside Status is: Inpatient Remains inpatient appropriate because: Continue hospital stay for GI evaluation               Diet Orders (From admission, onward)     Start     Ordered   05/12/23 0001  Diet NPO time specified Except for: Sips with Meds  Diet effective midnight       Question:  Except for  Answer:  Clearance Coots with Meds   05/11/23 1759            Subjective:  Awaiting his endoscopy.  Denying any complaints at this time.. No acute events overnight  Examination:  General exam: Appears calm and comfortable  Respiratory system: Clear to auscultation. Respiratory effort normal. Cardiovascular system: S1 & S2 heard, RRR.  No JVD, murmurs, rubs, gallops or clicks. No pedal edema. Gastrointestinal system: Abdomen is nondistended, soft and nontender. No organomegaly or masses felt. Normal bowel sounds heard. Central nervous system: Alert and oriented. No focal neurological deficits. Extremities: Symmetric 5 x 5 power. Skin: No rashes, lesions or ulcers Psychiatry: Judgement and insight appear normal. Mood & affect appropriate.  Objective: Vitals:   05/11/23 0519 05/11/23 1332 05/11/23 2041 05/12/23 0541  BP: (!) 141/75 (!) 143/83 (!) 150/82 (!) 143/82  Pulse: 65 67 65 90  Resp: 18 16 18 18   Temp: 97.7 F (36.5 C) (!) 97.4 F (36.3 C) 97.8 F (36.6 C) 97.9 F (36.6 C)  TempSrc: Oral Oral Oral Oral  SpO2: 96% 98% 98% 96%  Weight:      Height:        Intake/Output Summary (Last 24 hours) at 05/12/2023 1140 Last data filed at 05/12/2023 1000 Gross per 24 hour  Intake 2828.04 ml  Output 2100 ml  Net 728.04 ml   Filed Weights   05/08/23 1115  Weight: 90.7 kg    Scheduled Meds:  pantoprazole (PROTONIX) IV  40 mg Intravenous Q12H   Continuous Infusions:  lactated ringers 100 mL/hr at 05/12/23 0246   lactated ringers      Nutritional status     Body mass index is 27.12 kg/m.  Data Reviewed:   CBC: Recent Labs  Lab 05/08/23 1122 05/09/23 0453 05/10/23 0432 05/11/23 0457 05/12/23 0446  WBC 7.9 9.0 14.0* 10.4 9.9  HGB 17.5* 16.4 16.8 15.6 15.8  HCT 50.3 47.5 48.5 45.5 45.3  MCV 85.5 87.6 87.4 86.7 86.8  PLT 96* 201 203 197 179   Basic Metabolic Panel: Recent Labs  Lab 05/08/23 1245 05/09/23 0453 05/10/23 0432 05/11/23 0457 05/12/23 0446  NA 138 137 137 136 139  K 3.6 4.3 4.3 4.1 4.4  CL 103 105 103 103 103  CO2 22 22 24 22 26   GLUCOSE 94 103* 98 114* 113*  BUN 13 13 11 11 12   CREATININE 1.03 1.05 1.06 0.94 1.00  CALCIUM 8.9 8.8* 8.7* 8.7* 9.0  MG  --   --  2.3 2.3 2.3  PHOS  --   --  3.5  --   --    GFR: Estimated Creatinine Clearance: 65.7 mL/min (by C-G formula  based on SCr of 1 mg/dL). Liver Function Tests: Recent Labs  Lab 05/08/23 1245  AST 21  ALT 29  ALKPHOS 47  BILITOT 1.1  PROT 7.0  ALBUMIN 3.9   Recent Labs  Lab 05/08/23 1122  LIPASE 48   No results for input(s): "AMMONIA" in the last 168 hours. Coagulation Profile: No results for input(s): "INR", "PROTIME" in the last 168 hours. Cardiac Enzymes: No results for input(s): "CKTOTAL", "CKMB", "CKMBINDEX", "TROPONINI" in the last 168 hours. BNP (last 3 results) No results for input(s): "PROBNP" in the last 8760 hours. HbA1C: No results for input(s): "HGBA1C" in the last 72 hours. CBG: No results for input(s): "GLUCAP" in the last 168 hours. Lipid Profile: No results for input(s): "CHOL", "HDL", "LDLCALC", "TRIG", "CHOLHDL", "LDLDIRECT" in the last 72 hours. Thyroid Function Tests: No results for input(s): "TSH", "T4TOTAL", "FREET4", "T3FREE", "THYROIDAB" in the last 72 hours. Anemia Panel: No results for input(s): "VITAMINB12", "FOLATE", "FERRITIN", "TIBC", "IRON", "RETICCTPCT" in the last 72 hours. Sepsis Labs: No results for input(s): "PROCALCITON", "LATICACIDVEN" in the last 168 hours.  No results found for this or any previous visit (from the past 240 hour(s)).       Radiology Studies: No results found.         LOS: 4 days   Time spent= 35 mins    [name], MD [facility]  If 7PM-7AM, please contact night-coverage  05/12/2023, 11:40 AM

## 2023-05-13 ENCOUNTER — Inpatient Hospital Stay (HOSPITAL_COMMUNITY): Payer: Medicare Other

## 2023-05-13 ENCOUNTER — Other Ambulatory Visit: Payer: Self-pay

## 2023-05-13 DIAGNOSIS — K2980 Duodenitis without bleeding: Secondary | ICD-10-CM | POA: Diagnosis not present

## 2023-05-13 DIAGNOSIS — D696 Thrombocytopenia, unspecified: Secondary | ICD-10-CM | POA: Diagnosis not present

## 2023-05-13 DIAGNOSIS — K311 Adult hypertrophic pyloric stenosis: Secondary | ICD-10-CM | POA: Diagnosis not present

## 2023-05-13 DIAGNOSIS — B9681 Helicobacter pylori [H. pylori] as the cause of diseases classified elsewhere: Secondary | ICD-10-CM

## 2023-05-13 DIAGNOSIS — D132 Benign neoplasm of duodenum: Secondary | ICD-10-CM

## 2023-05-13 DIAGNOSIS — K2950 Unspecified chronic gastritis without bleeding: Secondary | ICD-10-CM

## 2023-05-13 LAB — BASIC METABOLIC PANEL
Anion gap: 9 (ref 5–15)
BUN: 10 mg/dL (ref 8–23)
CO2: 26 mmol/L (ref 22–32)
Calcium: 8.8 mg/dL — ABNORMAL LOW (ref 8.9–10.3)
Chloride: 103 mmol/L (ref 98–111)
Creatinine, Ser: 1.02 mg/dL (ref 0.61–1.24)
GFR, Estimated: >60 mL/min (ref >60)
Glucose, Bld: 103 mg/dL — ABNORMAL HIGH (ref 70–99)
Potassium: 3.7 mmol/L (ref 3.5–5.1)
Sodium: 138 mmol/L (ref 135–145)

## 2023-05-13 LAB — CBC
HCT: 45 % (ref 39.0–52.0)
Hemoglobin: 15.3 g/dL (ref 13.0–17.0)
MCH: 29.5 pg (ref 26.0–34.0)
MCHC: 34 g/dL (ref 30.0–36.0)
MCV: 86.9 fL (ref 80.0–100.0)
Platelets: 181 10*3/uL (ref 150–400)
RBC: 5.18 MIL/uL (ref 4.22–5.81)
RDW: 12.4 % (ref 11.5–15.5)
WBC: 7.5 10*3/uL (ref 4.0–10.5)
nRBC: 0 % (ref 0.0–0.2)

## 2023-05-13 LAB — SURGICAL PATHOLOGY

## 2023-05-13 LAB — MAGNESIUM: Magnesium: 2.3 mg/dL (ref 1.7–2.4)

## 2023-05-13 NOTE — Progress Notes (Signed)
PROGRESS NOTE    Charles Lawson  [id number]:[id number] DOB: [birth year] DOA: 05/08/2023 PCP: Corwin Levins, MD    Brief Narrative:  82 year old with history of HLD comes to the hospital with complaints of epigastric pain ongoing for 2 weeks.  Had outpatient ultrasound on Thursday which was negative.  In the ED CT scan showed nonspecific inflammation/duodenitis.  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 pylori gastritis.  GI will plan on rescoping this patient and eventually started on H. pylori treatment.  In the meantime continue PPI   Assessment & Plan:  Principal Problem:   Duodenitis Active Problems:   Thrombocytopenia (HCC)   Abdominal pain secondary to duodenitis/stricture causing gastric outlet obstruction - CT scan showed nonspecific inflammation/duodenitis.  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  Stricture shows tubular adenoma and concerns of H. pylori gastritis.  H. pylori treatment per GI team.  Repeat endoscopy on 8/21 showed persisting gastritis, acquired duodenal stenosis.  Further plans per GI and general surgery team -Diet per GI.  Check CA 19-9   Thrombocytopenia - Normal     DVT prophylaxis: SCDs Code Status: Full code Family Communication: Spouse at bedside Status is: Inpatient Remains inpatient appropriate because: Ongoing management per GI and general surgery.               Diet Orders (From admission, onward)     Start     Ordered   05/12/23 1342  Diet clear liquid Room service appropriate? Yes; Fluid consistency: Thin  Diet effective now       Question Answer Comment  Room service appropriate? Yes   Fluid consistency: Thin      05/12/23 1341            Subjective: Patient seen at bedside.  Still unable to have good oral intake.   Examination:  General exam: Appears  calm and comfortable  Respiratory system: Clear to auscultation. Respiratory effort normal. Cardiovascular system: S1 & S2 heard, RRR. No JVD, murmurs, rubs, gallops or clicks. No pedal edema. Gastrointestinal system: Abdomen is nondistended, soft and nontender. No organomegaly or masses felt. Normal bowel sounds heard. Central nervous system: Alert and oriented. No focal neurological deficits. Extremities: Symmetric 5 x 5 power. Skin: No rashes, lesions or ulcers Psychiatry: Judgement and insight appear normal. Mood & affect appropriate.  Objective: Vitals:   05/12/23 2134 05/13/23 0137 05/13/23 0543 05/13/23 1325  BP: (!) 154/89 (!) 148/78 (!) 140/79 (!) 148/85  Pulse: 63 64 62 (!) 58  Resp: 15 15 17 18   Temp: 98.4 F (36.9 C) 98.5 F (36.9 C) 98 F (36.7 C) 98 F (36.7 C)  TempSrc: Oral Oral Oral Oral  SpO2: 96% 96% 96% 97%  Weight:      Height:        Intake/Output Summary (Last 24 hours) at 05/13/2023 1405 Last data filed at 05/13/2023 1400 Gross per 24 hour  Intake 2825.4 ml  Output 1575 ml  Net 1250.4 ml   Filed Weights   05/08/23 1115 05/12/23 1341  Weight: 90.7 kg 88.8 kg    Scheduled Meds:  pantoprazole (PROTONIX) IV  40 mg Intravenous Q12H   Continuous Infusions:  lactated ringers 100 mL/hr at 05/13/23 1215   lactated ringers 1,000 mL (05/12/23 1157)    Nutritional status     Body mass index is 26.54 kg/m.  Data Reviewed:   CBC:  Recent Labs  Lab 05/09/23 0453 05/10/23 0432 05/11/23 0457 05/12/23 0446 05/13/23 0445  WBC 9.0 14.0* 10.4 9.9 7.5  HGB 16.4 16.8 15.6 15.8 15.3  HCT 47.5 48.5 45.5 45.3 45.0  MCV 87.6 87.4 86.7 86.8 86.9  PLT 201 203 197 179 181   Basic Metabolic Panel: Recent Labs  Lab 05/09/23 0453 05/10/23 0432 05/11/23 0457 05/12/23 0446 05/13/23 0445  NA 137 137 136 139 138  K 4.3 4.3 4.1 4.4 3.7  CL 105 103 103 103 103  CO2 22 24 22 26 26   GLUCOSE 103* 98 114* 113* 103*  BUN 13 11 11 12 10   CREATININE 1.05 1.06  0.94 1.00 1.02  CALCIUM 8.8* 8.7* 8.7* 9.0 8.8*  MG  --  2.3 2.3 2.3 2.3  PHOS  --  3.5  --   --   --    GFR: Estimated Creatinine Clearance: 64.5 mL/min (by C-G formula based on SCr of 1.02 mg/dL). Liver Function Tests: Recent Labs  Lab 05/08/23 1245  AST 21  ALT 29  ALKPHOS 47  BILITOT 1.1  PROT 7.0  ALBUMIN 3.9   Recent Labs  Lab 05/08/23 1122  LIPASE 48   No results for input(s): "AMMONIA" in the last 168 hours. Coagulation Profile: No results for input(s): "INR", "PROTIME" in the last 168 hours. Cardiac Enzymes: No results for input(s): "CKTOTAL", "CKMB", "CKMBINDEX", "TROPONINI" in the last 168 hours. BNP (last 3 results) No results for input(s): "PROBNP" in the last 8760 hours. HbA1C: No results for input(s): "HGBA1C" in the last 72 hours. CBG: No results for input(s): "GLUCAP" in the last 168 hours. Lipid Profile: No results for input(s): "CHOL", "HDL", "LDLCALC", "TRIG", "CHOLHDL", "LDLDIRECT" in the last 72 hours. Thyroid Function Tests: No results for input(s): "TSH", "T4TOTAL", "FREET4", "T3FREE", "THYROIDAB" in the last 72 hours. Anemia Panel: No results for input(s): "VITAMINB12", "FOLATE", "FERRITIN", "TIBC", "IRON", "RETICCTPCT" in the last 72 hours. Sepsis Labs: No results for input(s): "PROCALCITON", "LATICACIDVEN" in the last 168 hours.  No results found for this or any previous visit (from the past 240 hour(s)).       Radiology Studies: No results found.         LOS: 5 days   Time spent= 35 mins    [name], MD [facility]  If 7PM-7AM, please contact night-coverage  05/13/2023, 2:05 PM

## 2023-05-13 NOTE — Progress Notes (Addendum)
Daily Progress Note  DOA: 05/08/2023 Hospital Day: 6  Chief Complaint:  gastric outlet obstruction   ASSESSMENT & PLAN   Brief Narrative:  Charles Lawson is a 82 y.o. year old male with a history of  HLD, BPH. Admitted 8/17 with upper abdominal bloatingpossible GOO. We saw in consult on 8/18.   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 Biopsy of stricture +  for adenoma with LGD. Repeat EGD yesterday showed similar stenosis but scope was able to traverse the area  --Surgery evaluated. [facility] out to [facility].  --Not tolerating trial of clears. Ordering NGT to [facility] --Pharmacy consulted for TNA  H. Pylori gastritis.  --Treating H pylori probably would not change things. Holding off to make sure he is able to tolerate the regimen.  ------------------------------------------------------------------------------------------------------    [facility] Attending   I have taken an interval history, reviewed the chart and examined the patient. I agree with the Advanced Practitioner's note, impression and recommendations with the following additions:  He is not doing well with clears. Restart NGT- [facility] TPN Transfer to where can get EUS and surgical expertise needed.  Iva Boop, MD, [facility] See Loretha Stapler on call - gastroenterology for best contact person 05/13/2023 5:11 PM   Subjective   Tried small amount clears but feels they liquid is just sitting in stomach    Objective    EGD 05/10/23 for gastric outlet obstruction  - Acquired duodenal stenosis/stricture w/ gastric outlet obstruction. Some food debris in proximal duodenum also. Stricture was biopsied. Edema and mass effect - no visible ulcer though that is not excluded. No overt malignant changes but that is possible also. I could pass the ultraslim gastroscope to the distal edge of stenosis/stricture - length  3-5 cm I think - Acquired deformity in the entire stomach. Dilated. - A small amount of food (residue) in the stomach. - Gastritis. Biopsied. Body and antrunm, R/O H pylori - The examination was otherwise normal. A. 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       Negative for high-grade dysplasia or malignancy.   B. STOMACH, ANTRUM AND BODY, BIOPSY:       H. pylori associated chronic inactive gastritis.       H. pylori identified on HE.       Negative for intestinal metaplasia or dysplasia.    EGD 8/21  for polyps in the duodenum - gastric outlet obstruction - prior biopsy adenoma 2 d ago  - Acquired duodenal stenosis. Biopsied. This is where duodenal adenoma was found with biopsies 2 days ago - the stenosis/stricture allows passage of adult gastroscope further than before but could not pass through. - Gastritis. Known H pylori gastritis - not yet treated. - Food (residue) in the stomach. Liquid and some roughage as before. I did suction 2-300 cc fluid. Retained food in the duodenum. A few pieces of roughage.  A. DUODENUM, POLYPECTOMY:       Polypoid duodenal mucosa with Brunner gland hyperplasia and reactive epithelial changes.       Negative for dysplasia or malignancy.       No evidence of residual adenoma.    Recent Labs    05/11/23 0457 05/12/23 0446 05/13/23 0445  WBC 10.4 9.9 7.5  HGB 15.6 15.8 15.3  HCT 45.5 45.3 45.0  PLT 197 179 181  BMET Recent Labs    05/11/23 0457 05/12/23 0446 05/13/23 0445  NA 136 139 138  K 4.1 4.4 3.7  CL 103 103 103  CO2 22 26 26   GLUCOSE 114* 113* 103*  BUN 11 12 10   CREATININE 0.94 1.00 1.02  CALCIUM 8.7* 9.0 8.8*   LFT No results for input(s): "PROT", "ALBUMIN", "AST", "ALT", "ALKPHOS", "BILITOT", "BILIDIR", "IBILI" in the last 72 hours. PT/INR No results for input(s): "LABPROT", "INR" in the last 72 hours.   Imaging:  DG Abd Portable 2V CLINICAL DATA:  Gastric outlet  obstruction  EXAM: PORTABLE ABDOMEN - 2 VIEW  COMPARISON:  05/09/2023  FINDINGS: Nasogastric tube tip in the stomach body. Gas fills the stomach. Scattered gas in nondistended colon. Small bowel relatively gasless.  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  No significant abnormal air-fluid levels or findings of free intraperitoneal gas.  IMPRESSION: 1. Nasogastric tube tip in the stomach body. The stomach is mildly distended with gas. 2. 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 3. 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  Electronically Signed   By: Gaylyn Rong M.D.   On: 05/10/2023 13:29     Scheduled inpatient medications:   pantoprazole (PROTONIX) IV  40 mg Intravenous Q12H   Continuous inpatient infusions:   lactated ringers 100 mL/hr at 05/13/23 1215   lactated ringers 1,000 mL (05/12/23 1157)   PRN inpatient medications: acetaminophen **OR** acetaminophen, guaiFENesin, hydrALAZINE, ipratropium-albuterol, metoprolol tartrate, morphine injection, ondansetron **OR** ondansetron (ZOFRAN) IV, phenol, senna-docusate, traZODone  Vital signs in last 24 hours: Temp:  [97.9 F (36.6 C)-98.5 F (36.9 C)] 98 F (36.7 C) (08/22 0543) Pulse Rate:  [62-66] 62 (08/22 0543) Resp:  [15-17] 17 (08/22 0543) BP: (140-159)/(78-89) 140/79 (08/22 0543) SpO2:  [96 %-99 %] 96 % (08/22 0543) Weight:  [88.8 kg] 88.8 kg (08/21 1341) Last BM Date : 05/13/23  Intake/Output Summary (Last 24 hours) at 05/13/2023 1322 Last data filed at 05/13/2023 1000 Gross per 24 hour  Intake 2185.42 ml  Output 1575 ml  Net 610.42 ml    Intake/Output from previous day: 08/21 0701 - 08/22 0700 In: 2662.1 [P.O.:980; I.V.:1682.1] Out: 1550  [Urine:1550] Intake/Output this shift: Total I/O In: 240 [P.O.:240] Out: 325 [Urine:325]   Physical Exam:  General: Alert male in NAD Heart:  Regular rate and rhythm.  Pulmonary: Normal respiratory effort Abdomen: Soft, nondistended, nontender. Hypoactive bowel sounds. Extremities: No lower extremity edema  Neurologic: Alert and oriented Psych: Pleasant. Cooperative. Insight appears normal.    Principal Problem:   Duodenitis Active Problems:   Thrombocytopenia (HCC)   Gastric outlet obstruction     LOS: 5 days   Willette Cluster ,NP 05/13/2023, 1:22 PM

## 2023-05-13 NOTE — Progress Notes (Signed)
PHARMACY - TOTAL PARENTERAL NUTRITION CONSULT NOTE   Indication:  Gastric outlet obstruction  Assessment:  Patient presenting with a gastric outlet obstruction and inability to tolerate a clear liquid diet--Pharmacy consulted for TPN initiation.   Plan: TPN labs have been ordered for 8/23 AM. Pharmacy and RD will evaluate the patient tomorrow (8/23) for initiation of TPN given the TPN order cut-off time of noon.

## 2023-05-13 NOTE — Progress Notes (Signed)
Mobility Specialist - Progress Note   05/13/23 0900  Mobility  Activity Ambulated with assistance in hallway  Level of Assistance Independent after set-up  Assistive Device Other (Comment) (IV Pole)  Distance Ambulated (ft) 500 ft  Activity Response Tolerated well  Mobility Referral Yes  $Mobility charge 1 Mobility  Mobility Specialist Start Time (ACUTE ONLY) [id number]  Mobility Specialist Stop Time (ACUTE ONLY) 0859  Mobility Specialist Time Calculation (min) (ACUTE ONLY) 7 min   Pt received in bed and agreeable to mobility. No complaints during session. Pt to bed after session with all needs met.    [facility]

## 2023-05-13 NOTE — Progress Notes (Signed)
PICC order noted.  Spoke with Eli Phillips, RN via securechat.  Aware that PICC will not be placed today.

## 2023-05-14 DIAGNOSIS — G8918 Other acute postprocedural pain: Secondary | ICD-10-CM | POA: Diagnosis not present

## 2023-05-14 DIAGNOSIS — C241 Malignant neoplasm of ampulla of Vater: Secondary | ICD-10-CM | POA: Diagnosis not present

## 2023-05-14 DIAGNOSIS — C259 Malignant neoplasm of pancreas, unspecified: Secondary | ICD-10-CM | POA: Diagnosis not present

## 2023-05-14 DIAGNOSIS — E440 Moderate protein-calorie malnutrition: Secondary | ICD-10-CM | POA: Insufficient documentation

## 2023-05-14 DIAGNOSIS — K311 Adult hypertrophic pyloric stenosis: Secondary | ICD-10-CM | POA: Diagnosis not present

## 2023-05-14 DIAGNOSIS — E785 Hyperlipidemia, unspecified: Secondary | ICD-10-CM | POA: Diagnosis not present

## 2023-05-14 DIAGNOSIS — I4510 Unspecified right bundle-branch block: Secondary | ICD-10-CM | POA: Diagnosis not present

## 2023-05-14 DIAGNOSIS — C775 Secondary and unspecified malignant neoplasm of intrapelvic lymph nodes: Secondary | ICD-10-CM | POA: Diagnosis not present

## 2023-05-14 DIAGNOSIS — K660 Peritoneal adhesions (postprocedural) (postinfection): Secondary | ICD-10-CM | POA: Diagnosis not present

## 2023-05-14 DIAGNOSIS — K315 Obstruction of duodenum: Secondary | ICD-10-CM | POA: Diagnosis not present

## 2023-05-14 DIAGNOSIS — C772 Secondary and unspecified malignant neoplasm of intra-abdominal lymph nodes: Secondary | ICD-10-CM | POA: Diagnosis not present

## 2023-05-14 DIAGNOSIS — R000 Tachycardia, unspecified: Secondary | ICD-10-CM | POA: Diagnosis not present

## 2023-05-14 DIAGNOSIS — C7989 Secondary malignant neoplasm of other specified sites: Secondary | ICD-10-CM | POA: Diagnosis not present

## 2023-05-14 DIAGNOSIS — R1013 Epigastric pain: Secondary | ICD-10-CM | POA: Diagnosis not present

## 2023-05-14 DIAGNOSIS — B9681 Helicobacter pylori [H. pylori] as the cause of diseases classified elsewhere: Secondary | ICD-10-CM | POA: Diagnosis not present

## 2023-05-14 DIAGNOSIS — K2980 Duodenitis without bleeding: Secondary | ICD-10-CM | POA: Diagnosis not present

## 2023-05-14 DIAGNOSIS — I700 Atherosclerosis of aorta: Secondary | ICD-10-CM | POA: Diagnosis not present

## 2023-05-14 LAB — CANCER ANTIGEN 19-9: CA 19-9: 48 U/mL — ABNORMAL HIGH (ref 0–35)

## 2023-05-14 LAB — CBC
HCT: 44.6 % (ref 39.0–52.0)
Hemoglobin: 15.6 g/dL (ref 13.0–17.0)
MCH: 30.2 pg (ref 26.0–34.0)
MCHC: 35 g/dL (ref 30.0–36.0)
MCV: 86.4 fL (ref 80.0–100.0)
Platelets: 191 10*3/uL (ref 150–400)
RBC: 5.16 MIL/uL (ref 4.22–5.81)
RDW: 12 % (ref 11.5–15.5)
WBC: 8.3 10*3/uL (ref 4.0–10.5)
nRBC: 0 % (ref 0.0–0.2)

## 2023-05-14 LAB — PHOSPHORUS: Phosphorus: 3.5 mg/dL (ref 2.5–4.6)

## 2023-05-14 LAB — COMPREHENSIVE METABOLIC PANEL
ALT: 36 U/L (ref 0–44)
AST: 26 U/L (ref 15–41)
Albumin: 3.5 g/dL (ref 3.5–5.0)
Alkaline Phosphatase: 39 U/L (ref 38–126)
Anion gap: 12 (ref 5–15)
BUN: 8 mg/dL (ref 8–23)
CO2: 23 mmol/L (ref 22–32)
Calcium: 8.7 mg/dL — ABNORMAL LOW (ref 8.9–10.3)
Chloride: 101 mmol/L (ref 98–111)
Creatinine, Ser: 1.04 mg/dL (ref 0.61–1.24)
GFR, Estimated: >60 mL/min (ref >60)
Glucose, Bld: 100 mg/dL — ABNORMAL HIGH (ref 70–99)
Potassium: 3.6 mmol/L (ref 3.5–5.1)
Sodium: 136 mmol/L (ref 135–145)
Total Bilirubin: 1.1 mg/dL (ref 0.3–1.2)
Total Protein: 6.3 g/dL — ABNORMAL LOW (ref 6.5–8.1)

## 2023-05-14 LAB — HEMOGLOBIN A1C
Hgb A1c MFr Bld: 5.1 % (ref 4.8–5.6)
Mean Plasma Glucose: 99.67 mg/dL

## 2023-05-14 LAB — MAGNESIUM: Magnesium: 2.1 mg/dL (ref 1.7–2.4)

## 2023-05-14 MED ORDER — TRAVASOL 10 % IV SOLN
INTRAVENOUS | Status: DC
  Filled 2023-05-14: qty 480

## 2023-05-14 MED ORDER — INSULIN ASPART 100 UNIT/ML IJ SOLN: 0.0000 [IU] | Freq: Four times a day (QID) | INTRAMUSCULAR | Status: DC

## 2023-05-14 MED ORDER — LACTATED RINGERS IV SOLN: INTRAVENOUS | Status: DC

## 2023-05-14 NOTE — Progress Notes (Signed)
PROGRESS NOTE    Charles Lawson  RWE:[id number] DOB: Sep 05, 1941 DOA: 05/08/2023 PCP: Corwin Levins, MD    Brief Narrative:  82 year old with history of HLD comes to the hospital with complaints of epigastric pain ongoing for 2 weeks.  Had outpatient ultrasound on Thursday which was negative.  In the ED CT scan showed nonspecific inflammation/duodenitis.  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 pylori gastritis.  GI will plan on rescoping this patient and eventually started on H. pylori treatment.  In the meantime continue PPI   Assessment & Plan:  Principal Problem:   Duodenitis Active Problems:   Thrombocytopenia (HCC)   Abdominal pain secondary to duodenitis/stricture causing gastric outlet obstruction - CT scan showed nonspecific inflammation/duodenitis.  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  Stricture shows tubular adenoma and concerns of H. pylori gastritis.  H. pylori treatment per GI team.  Repeat endoscopy on 8/21 showed persisting gastritis, acquired duodenal stenosis.  Further plans per GI and general surgery team. For now start place PICC, start TPN, and NG tube to suction.  -Diet per GI.  Check CA 19-9  Transfer calls: [facility]: Declined by Dr Milbert Coulter due to capacity [facility]: Declined by Dr Danae Chen due to capacity.  [facility]: Dr Ebony Cargo requested to call back 8/24, to discuss with Hepatobiliary team.  Discussed with Dr. Pricilla Riffle from GI, who will see the patient but would like me to speak with the Surg Onc so ptn can be placed on that service. Awaiting call back.    Thrombocytopenia - Normal       DVT prophylaxis: SCDs Code Status: Full code Family Communication: Spouse At bedside.  Status is: Inpatient Remains inpatient appropriate because: Ongoing management per GI and general surgery.                Diet Orders (From admission, onward)     Start     Ordered   05/12/23 1342  Diet clear liquid Room service appropriate? Yes; Fluid consistency: Thin  Diet effective now       Question Answer Comment  Room service appropriate? Yes   Fluid consistency: Thin      05/12/23 1341            Subjective: NGT in place No complaints.    Examination:  General exam: Appears calm and comfortable ; NGT in place.  Respiratory system: Clear to auscultation. Respiratory effort normal. Cardiovascular system: S1 & S2 heard, RRR. No JVD, murmurs, rubs, gallops or clicks. No pedal edema. Gastrointestinal system: Abdomen is nondistended, soft and nontender. No organomegaly or masses felt. Normal bowel sounds heard. Central nervous system: Alert and oriented. No focal neurological deficits. Extremities: Symmetric 5 x 5 power. Skin: No rashes, lesions or ulcers Psychiatry: Judgement and insight appear normal. Mood & affect appropriate.  Objective: Vitals:   05/13/23 0543 05/13/23 1325 05/13/23 2046 05/14/23 0642  BP: (!) 140/79 (!) 148/85 (!) 162/85 (!) 170/85  Pulse: 62 (!) 58 62 62  Resp: 17 18 18 18   Temp: 98 F (36.7 C) 98 F (36.7 C) 98.3 F (36.8 C) 98.5 F (36.9 C)  TempSrc: Oral Oral Oral Oral  SpO2: 96% 97% 96% 95%  Weight:      Height:        Intake/Output Summary (Last 24 hours) at 05/14/2023 1220 Last data filed at 05/14/2023 1000 Gross per 24 hour  Intake 2501.44 ml  Output 3850 ml  Net -1348.56 ml   Filed Weights   05/08/23 1115 05/12/23 1341  Weight: 90.7 kg 88.8 kg    Scheduled Meds:  insulin aspart  0-15 Units Subcutaneous Q6H   pantoprazole (PROTONIX) IV  40 mg Intravenous Q12H   Continuous Infusions:  lactated ringers 100 mL/hr at 05/13/23 2108   lactated ringers 1,000 mL (05/12/23 1157)   lactated ringers     TPN ADULT (ION)      Nutritional status     Body mass index is 26.54 kg/m.  Data Reviewed:   CBC: Recent Labs   Lab 05/10/23 0432 05/11/23 0457 05/12/23 0446 05/13/23 0445 05/14/23 0422  WBC 14.0* 10.4 9.9 7.5 8.3  HGB 16.8 15.6 15.8 15.3 15.6  HCT 48.5 45.5 45.3 45.0 44.6  MCV 87.4 86.7 86.8 86.9 86.4  PLT 203 197 179 181 191   Basic Metabolic Panel: Recent Labs  Lab 05/10/23 0432 05/11/23 0457 05/12/23 0446 05/13/23 0445 05/14/23 0422  NA 137 136 139 138 136  K 4.3 4.1 4.4 3.7 3.6  CL 103 103 103 103 101  CO2 24 22 26 26 23   GLUCOSE 98 114* 113* 103* 100*  BUN 11 11 12 10 8   CREATININE 1.06 0.94 1.00 1.02 1.04  CALCIUM 8.7* 8.7* 9.0 8.8* 8.7*  MG 2.3 2.3 2.3 2.3 2.1  PHOS 3.5  --   --   --  3.5   GFR: Estimated Creatinine Clearance: 63.2 mL/min (by C-G formula based on SCr of 1.04 mg/dL). Liver Function Tests: Recent Labs  Lab 05/08/23 1245 05/14/23 0422  AST 21 26  ALT 29 36  ALKPHOS 47 39  BILITOT 1.1 1.1  PROT 7.0 6.3*  ALBUMIN 3.9 3.5   Recent Labs  Lab 05/08/23 1122  LIPASE 48   No results for input(s): "AMMONIA" in the last 168 hours. Coagulation Profile: No results for input(s): "INR", "PROTIME" in the last 168 hours. Cardiac Enzymes: No results for input(s): "CKTOTAL", "CKMB", "CKMBINDEX", "TROPONINI" in the last 168 hours. BNP (last 3 results) No results for input(s): "PROBNP" in the last 8760 hours. HbA1C: Recent Labs    05/14/23 1051  HGBA1C 5.1   CBG: No results for input(s): "GLUCAP" in the last 168 hours. Lipid Profile: No results for input(s): "CHOL", "HDL", "LDLCALC", "TRIG", "CHOLHDL", "LDLDIRECT" in the last 72 hours. Thyroid Function Tests: No results for input(s): "TSH", "T4TOTAL", "FREET4", "T3FREE", "THYROIDAB" in the last 72 hours. Anemia Panel: No results for input(s): "VITAMINB12", "FOLATE", "FERRITIN", "TIBC", "IRON", "RETICCTPCT" in the last 72 hours. Sepsis Labs: No results for input(s): "PROCALCITON", "LATICACIDVEN" in the last 168 hours.  No results found for this or any previous visit (from the past 240 hour(s)).        Radiology Studies: DG Abd Portable 1V  Result Date: 05/13/2023 CLINICAL DATA:  NG tube placement EXAM: PORTABLE ABDOMEN - 1 VIEW COMPARISON:  05/13/2023 FINDINGS: NG tube tip is in the mid stomach, slightly advanced since prior study. Nonobstructive bowel gas pattern. IMPRESSION: NG tube in the mid stomach. Electronically Signed   By: Charlett Nose M.D.   On: 05/13/2023 22:33   DG Abd Portable 1V  Result Date: 05/13/2023 CLINICAL DATA:  [id number] Encounter for imaging study to confirm nasogastric (NG) tube placement [id number]. EXAM: PORTABLE ABDOMEN - 1 VIEW COMPARISON:  Abdominal radiographs 05/10/2023. FINDINGS: Enteric tube tip and side port project over the stomach. IMPRESSION: Enteric tube tip and side port project over the stomach. Electronically Signed  By: Orvan Falconer M.D.   On: 05/13/2023 21:52   [place] EKG SITE RITE  Result Date: 05/13/2023 If [facility] image not attached, placement could not be confirmed due to current cardiac rhythm.          LOS: 6 days   Time spent= 35 mins    [name], MD [facility]  If 7PM-7AM, please contact night-coverage  05/14/2023, 12:20 PM

## 2023-05-14 NOTE — Progress Notes (Signed)
PHARMACY - TOTAL PARENTERAL NUTRITION CONSULT NOTE   Indication:  Gastric outlet obstruction  Patient Measurements: Height: 6' (182.9 cm) Weight: 88.8 kg (195 lb 11.2 oz) IBW/kg (Calculated) : 77.6 TPN AdjBW (KG): 90.7 Body mass index is 26.54 kg/m. Usual Weight:    PMH; HTN, HLD, BPH, abdominal hernia repair. GERD, ED, IGT, occasional Goody's powder use.  Assessment: NVD and abdominal pain for a few weeks, especially post-prandial. W/o suggestive of duodenitis in ED. R/o GOO. Noted thrombocytopenia 8/17 only.  Glucose / Insulin: no h/o DM noted. Electrolytes: K 3.6, Mg/Phos WNL Renal: Scr 1 Hepatic: LFT's WNL Intake / Output; MIVF: LR 123ml/hr GI Imaging: GI Surgeries / Procedures:  8/18 EGD: GOO due to severe duodenal stenosis, duodenal adenoma, HPylori gastritis,   Central access: PICC ordered 8/23 TPN start date: 8/23  Nutritional Goals: Goal TPN rate is ____mL/hr (provides _____ g of protein and ____kcals per day)  RD Assessment: pending   Current Nutrition:  Clear liquids--not tolerating  Plan:  Start TPN at 40mL/hr at 1800 Electrolytes in TPN: Na 48mEq/L, K 31mEq/L, Ca 83mEq/L, Mg 33mEq/L, and Phos 2mmol/L. Cl:Ac 1:1 Add standard MVI and trace elements to TPN Initiate Moderate q6h SSI and adjust as needed  Reduce MIVF to LR at 60 mL/hr at 1800 Monitor TPN labs on Mon/Thurs,  and PRN   Charles Lawson S. Charles Lawson, PharmD, BCPS Clinical Staff Pharmacist [email or web address] Charles Lawson, Cele Mote Stillinger 05/14/2023,10:03 AM

## 2023-05-14 NOTE — Progress Notes (Signed)
Mobility Specialist - Progress Note   05/14/23 0900  Mobility  Activity Ambulated with assistance in hallway  Level of Assistance Independent after set-up  Assistive Device Other (Comment) (IV Pole)  Distance Ambulated (ft) 500 ft  Activity Response Tolerated well  Mobility Referral Yes  $Mobility charge 1 Mobility  Mobility Specialist Start Time (ACUTE ONLY) [id number]  Mobility Specialist Stop Time (ACUTE ONLY) 0900  Mobility Specialist Time Calculation (min) (ACUTE ONLY) 8 min   Pt received in bed and agreeable to mobility. No complaints during session. Pt to bed after session with all needs met.    [facility]

## 2023-05-14 NOTE — Progress Notes (Addendum)
Daily Progress Note  DOA: 05/08/2023 Hospital Day: 7  Chief Complaint: gastric outlet obstruction   ASSESSMENT & PLAN   Brief Narrative:  Charles Lawson is a 82 y.o. year old male with a history of  HLD, BPH. Admitted 8/17 with upper abdominal bloating, possible GOO. We saw in consult on 8/18.   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 Biopsy of stricture +  for adenoma with LGD. Repeat EGD 8/21 showed similar stenosis but scope was able to traverse the area that time  --Surgery has evaluated. [facility]  [facility] unable to take due to being at capacity. [facility] has been in contact with [facility] --Wasn't tolerating trial of clears so NGT replaced yesterday. Had a liter output after NGT placement yesterday --Pharmacy to start TNA --Continue BID IV PPI --With TNA + LR he is getting about 100 ml / hr IV fluid --K okay at 3.6   H. Pylori gastritis.  --Treating H pylori probably would not change things. Holding off until he is able to tolerate the regimen.  ------------------------------------------------------------------------------------------    [facility] Attending    I agree with the Advanced Practitioner's note, impression and recommendations with the following additions:  Patient going to [facility] today - appreciate everyon's help.  Iva Boop, MD, [facility]     Subjective   Feels okay overall.    Objective    Recent Labs    05/12/23 0446 05/13/23 0445 05/14/23 0422  WBC 9.9 7.5 8.3  HGB 15.8 15.3 15.6  HCT 45.3 45.0 44.6  PLT 179 181 191   BMET Recent Labs    05/12/23 0446 05/13/23 0445 05/14/23 0422  NA 139 138 136  K 4.4 3.7 3.6  CL 103 103 101  CO2 26 26 23   GLUCOSE 113* 103* 100*  BUN 12 10 8   CREATININE 1.00 1.02 1.04  CALCIUM 9.0 8.8* 8.7*   LFT Recent Labs    05/14/23 0422  PROT 6.3*  ALBUMIN 3.5  AST 26  ALT 36  ALKPHOS 39   BILITOT 1.1   PT/INR No results for input(s): "LABPROT", "INR" in the last 72 hours.   Imaging:  DG Abd Portable 1V CLINICAL DATA:  NG tube placement  EXAM: PORTABLE ABDOMEN - 1 VIEW  COMPARISON:  05/13/2023  FINDINGS: NG tube tip is in the mid stomach, slightly advanced since prior study. Nonobstructive bowel gas pattern.  IMPRESSION: NG tube in the mid stomach.  Electronically Signed   By: Charlett Nose M.D.   On: 05/13/2023 22:33 DG Abd Portable 1V CLINICAL DATA:  [id number] Encounter for imaging study to confirm nasogastric (NG) tube placement [id number].  EXAM: PORTABLE ABDOMEN - 1 VIEW  COMPARISON:  Abdominal radiographs 05/10/2023.  FINDINGS: Enteric tube tip and side port project over the stomach.  IMPRESSION: Enteric tube tip and side port project over the stomach.  Electronically Signed   By: Orvan Falconer M.D.   On: 05/13/2023 21:52 [place] EKG SITE RITE If Site Rite image not attached, placement could not be confirmed due to  current cardiac rhythm.     Scheduled inpatient medications:   insulin aspart  0-15 Units Subcutaneous Q6H   pantoprazole (PROTONIX) IV  40 mg Intravenous Q12H   Continuous inpatient infusions:   lactated ringers 100 mL/hr at 05/13/23 2108   lactated ringers 1,000 mL (05/12/23 1157)   lactated ringers  TPN ADULT (ION)     PRN inpatient medications: acetaminophen **OR** acetaminophen, guaiFENesin, hydrALAZINE, ipratropium-albuterol, metoprolol tartrate, morphine injection, ondansetron **OR** ondansetron (ZOFRAN) IV, phenol, senna-docusate, traZODone  Vital signs in last 24 hours: Temp:  [98 F (36.7 C)-98.5 F (36.9 C)] 98.5 F (36.9 C) (08/23 2956) Pulse Rate:  [58-62] 62 (08/23 0642) Resp:  [18] 18 (08/23 0642) BP: (148-170)/(85) 170/85 (08/23 0642) SpO2:  [95 %-97 %] 95 % (08/23 0642) Last BM Date : 05/08/23  Intake/Output Summary (Last 24 hours) at 05/14/2023 1135 Last data filed at 05/14/2023 1000 Gross per 24 hour   Intake 2501.44 ml  Output 3850 ml  Net -1348.56 ml    Intake/Output from previous day: 08/22 0701 - 08/23 0700 In: 3080.6 [P.O.:600; I.V.:2480.6] Out: 3725 [Urine:2725; Emesis/NG output:1000] Intake/Output this shift: Total I/O In: -  Out: 450 [Urine:150; Emesis/NG output:300]   Physical Exam:  General: Alert male in NAD Heart:  Regular rate and rhythm.  Pulmonary: Normal respiratory effort Abdomen: Soft, nondistended, nontender. Normal bowel sounds. Extremities: No lower extremity edema  Neurologic: Alert and oriented Psych: Pleasant. Cooperative. Insight appears normal.    Principal Problem:   Duodenitis Active Problems:   Thrombocytopenia (HCC)   Gastric outlet obstruction   Duodenal adenoma     LOS: 6 days   Willette Cluster ,NP 05/14/2023, 11:35 AM

## 2023-05-14 NOTE — Progress Notes (Signed)
Pt d/c to [facility] and called [facility] Nurse. Pt left in stable condition with all belongings.

## 2023-05-14 NOTE — Care Management Important Message (Signed)
Important Message  Patient Details IM Letter given. Name: Charles Lawson MRN: [id number] Date of Birth: Apr 27, 1941   Medicare Important Message Given:  Yes     Caren Macadam 05/14/2023, 8:31 AM

## 2023-05-14 NOTE — Discharge Summary (Signed)
Physician Discharge Summary  Charles Lawson AOZ:[id number] DOB: Jun 13, 1941 DOA: 05/08/2023  PCP: Corwin Levins, MD  Admit date: 05/08/2023 Discharge date: 05/14/2023  Admitted From: Home Disposition: Transfer to [facility]  Recommendations for Outpatient Follow-up:  [facility] to [facility]  Discharge Condition: Stable CODE STATUS: Full code Diet recommendation: Currently n.p.o.  Brief/Interim Summary: Brief Narrative:  82 year old with history of HLD comes to the hospital with complaints of epigastric pain ongoing for 2 weeks.  Had outpatient ultrasound on Thursday which was negative.  In the ED CT scan showed nonspecific inflammation/duodenitis with evidence of gastric outlet obstruction.  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 pylori gastritis.  Patient underwent repeat endoscopy on 8/21 which showed persistent gastritis and acquired duodenal stenosis.  After further extensive discussion with GI, general surgery and hepatobiliary team, it was decided to transition patient to a higher level of care facility where patient can receive appropriate services in more expedited manner.  Case discussed with [facility] providers who have graciously accepted the patient for further management.  Patient and family aware of this.  Currently patient has NG tube in place to low intermittent suction.  Plan was to place PICC line and start TPN in the meantime later today but to avoid any further delay, will transition patient soon.    Assessment & Plan:  Principal Problem:   Duodenitis Active Problems:   Thrombocytopenia (HCC)   Abdominal pain secondary to duodenitis/stricture causing gastric outlet obstruction - CT scan showed nonspecific inflammation/duodenitis.  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   Stricture shows tubular adenoma and concerns of H. pylori gastritis.  H. pylori treatment per GI team.  Repeat endoscopy on 8/21 showed persisting gastritis, acquired duodenal stenosis.  Further plans per GI and general surgery team. For now start place PICC, start TPN, and NG tube to suction.  Plan was to place PICC line and start TPN today but seems like patient will likely have a bed today for the transfer.  Would not delay transfer as PICC line and TPN can be arranged at the receiving facility as well. -Diet per GI.  CA 19 9 = 48  Transfer calls: [facility]: Declined by Dr Milbert Coulter due to capacity [facility]: Declined by Dr Danae Chen due to capacity.  [facility]: Dr Ebony Cargo requested to call back 8/24, to discuss with Hepatobiliary team.  Discussed with Dr. Pricilla Riffle from GI, who will see the patient but would like me to speak with the Surg Onc so ptn can be placed on that service. Awaiting call back.   Addendum 823 2:15 PM Patient has been accepted to [facility] by Dr. Rae Mar for further management.   Thrombocytopenia - Normal   DVT prophylaxis: SCDs Code Status: Full code Family Communication: Spouse At bedside.  Status is: Inpatient Remains inpatient appropriate because: Transferred to [facility]     Discharge Diagnoses:  Principal Problem:   Duodenitis Active Problems:   Thrombocytopenia (HCC)   Gastric outlet obstruction   Duodenal adenoma      Consultations: GI Gen Sx  Subjective: Doing ok no complaints  Spouse at bedside.   Discharge Exam: Vitals:   05/14/23 0642 05/14/23 1319  BP: (!) 170/85 (!) 154/87  Pulse: 62 (!) 58  Resp: 18 18  Temp: 98.5 F (36.9 C) 98.6 F (37 C)  SpO2: 95% 98%  Vitals:   05/13/23 1325 05/13/23 2046 05/14/23 0642 05/14/23 1319  BP: (!) 148/85 (!) 162/85 (!) 170/85 (!) 154/87  Pulse: (!) 58 62 62 (!) 58  Resp: 18 18 18 18   Temp: 98 F (36.7 C) 98.3 F (36.8 C) 98.5 F (36.9 C) 98.6 F (37  C)  TempSrc: Oral Oral Oral Oral  SpO2: 97% 96% 95% 98%  Weight:      Height:        General: Pt is alert, awake, not in acute distress Cardiovascular: RRR, S1/S2 +, no rubs, no gallops Respiratory: CTA bilaterally, no wheezing, no rhonchi Abdominal: Soft, NT, ND, bowel sounds + Extremities: no edema, no cyanosis  Discharge Instructions   Allergies as of 05/14/2023       Reactions   Penicillins Other (See Comments)   Unknown childhood reaction        Medication List     TAKE these medications    ondansetron 4 MG tablet Commonly known as: Zofran Take 1 tablet (4 mg total) by mouth every 8 (eight) hours as needed for nausea or vomiting. What changed: when to take this   pantoprazole 40 MG tablet Commonly known as: PROTONIX Take 1 tablet (40 mg total) by mouth daily.        Follow-up Information     Corwin Levins, MD. Schedule an appointment as soon as possible for a visit in 3 days.   Specialties: Internal Medicine, Radiology Contact information: [address] [place] [address] [phone]         [facility] Emergency Department at [facility]. Go to .   Specialty: Emergency Medicine Why: As needed, If symptoms worsen Contact information: [address] [place] [address] [phone]               Allergies  Allergen Reactions   Penicillins Other (See Comments)    Unknown childhood reaction    You were cared for by a hospitalist during your hospital stay. If you have any questions about your discharge medications or the care you received while you were in the hospital after you are discharged, you can call the unit and asked to speak with the hospitalist on call if the hospitalist that took care of you is not available. Once you are discharged, your primary care physician will handle any further medical issues. Please note that no refills for any discharge medications will be authorized once you are  discharged, as it is imperative that you return to your primary care physician (or establish a relationship with a primary care physician if you do not have one) for your aftercare needs so that they can reassess your need for medications and monitor your lab values.  You were cared for by a hospitalist during your hospital stay. If you have any questions about your discharge medications or the care you received while you were in the hospital after you are discharged, you can call the unit and asked to speak with the hospitalist on call if the hospitalist that took care of you is not available. Once you are discharged, your primary care physician will handle any further medical issues. Please note that NO REFILLS for any discharge medications will be authorized once you are discharged, as it is imperative that you return to your primary care physician (or establish a relationship with a primary care physician if you do not have one) for your aftercare needs so that they can reassess your need  for medications and monitor your lab values.  Please request your Prim.MD to go over all Hospital Tests and Procedure/Radiological results at the follow up, please get all Hospital records sent to your Prim MD by signing hospital release before you go home.  Get CBC, CMP, 2 view Chest X ray checked  by Primary MD during your next visit or SNF MD in 5-7 days ( we routinely change or add medications that can affect your baseline labs and fluid status, therefore we recommend that you get the mentioned basic workup next visit with your PCP, your PCP may decide not to get them or add new tests based on their clinical decision)  On your next visit with your primary care physician please Get Medicines reviewed and adjusted.  If you experience worsening of your admission symptoms, develop shortness of breath, life threatening emergency, suicidal or homicidal thoughts you must seek medical attention immediately by calling 911  or calling your MD immediately  if symptoms less severe.  You Must read complete instructions/literature along with all the possible adverse reactions/side effects for all the Medicines you take and that have been prescribed to you. Take any new Medicines after you have completely understood and accpet all the possible adverse reactions/side effects.   Do not drive, operate heavy machinery, perform activities at heights, swimming or participation in water activities or provide baby sitting services if your were admitted for syncope or siezures until you have seen by Primary MD or a Neurologist and advised to do so again.  Do not drive when taking Pain medications.   Procedures/Studies: DG Abd Portable 1V  Result Date: 05/13/2023 CLINICAL DATA:  NG tube placement EXAM: PORTABLE ABDOMEN - 1 VIEW COMPARISON:  05/13/2023 FINDINGS: NG tube tip is in the mid stomach, slightly advanced since prior study. Nonobstructive bowel gas pattern. IMPRESSION: NG tube in the mid stomach. Electronically Signed   By: Charlett Nose M.D.   On: 05/13/2023 22:33   DG Abd Portable 1V  Result Date: 05/13/2023 CLINICAL DATA:  [id number] Encounter for imaging study to confirm nasogastric (NG) tube placement [id number]. EXAM: PORTABLE ABDOMEN - 1 VIEW COMPARISON:  Abdominal radiographs 05/10/2023. FINDINGS: Enteric tube tip and side port project over the stomach. IMPRESSION: Enteric tube tip and side port project over the stomach. Electronically Signed   By: Orvan Falconer M.D.   On: 05/13/2023 21:52   [place] EKG SITE RITE  Result Date: 05/13/2023 If [facility] image not attached, placement could not be confirmed due to current cardiac rhythm.  DG Abd Portable 2V  Result Date: 05/10/2023 CLINICAL DATA:  Gastric outlet obstruction EXAM: PORTABLE ABDOMEN - 2 VIEW COMPARISON:  05/09/2023 FINDINGS: Nasogastric tube tip in the stomach body. Gas fills the stomach. Scattered gas in nondistended colon. Small bowel relatively gasless. Lower  thoracic and lumbar spondylosis and degenerative disc disease noted. 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 No significant abnormal air-fluid levels or findings of free intraperitoneal gas. IMPRESSION: 1. Nasogastric tube tip in the stomach body. The stomach is mildly distended with gas. 2. 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 3. 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 Electronically Signed   By: Gaylyn Rong M.D.   On: 05/10/2023 13:29   DG Abd 1 View  Result Date: 05/09/2023 CLINICAL DATA:  NG tube placement. EXAM: ABDOMEN - 1 VIEW COMPARISON:  None Available. FINDINGS: NG tube tip is in the upper  stomach. Proximal side port of the NG tube is above the GE junction. NG tube could be advanced approximately 5 cm to place the side port below the GE junction. Prominent gastric bubble noted with diffuse gas in the colon. IMPRESSION: NG tube tip is in the upper stomach with proximal side port above the GE junction. NG tube could be advanced approximately 5 cm to place the side port below the GE junction. Electronically Signed   By: Kennith Center M.D.   On: 05/09/2023 12:46   CT ABDOMEN PELVIS W CONTRAST  Result Date: 05/08/2023 CLINICAL DATA:  Upper abdominal pain for two weeks. Patient [place] on Thursday and he stated that was negative. N/V/D over the last few weeks. No fever. EXAM: CT ABDOMEN AND PELVIS WITH CONTRAST TECHNIQUE: Multidetector CT imaging of the abdomen and pelvis was performed using the standard protocol following bolus administration of intravenous contrast. RADIATION DOSE REDUCTION: This exam was performed according to the departmental dose-optimization program which includes automated exposure control, adjustment of the mA and/or kV according to patient size and/or use of iterative reconstruction technique. CONTRAST:   OMNIPAQUE IOHEXOL 300 MG/ML  SOLN COMPARISON:  Abdominal sonogram 05/06/2023 FINDINGS: Lower chest: Scarring noted within both lung bases and lingula. No pleural effusion or consolidative change. Hepatobiliary: No focal liver abnormality is seen. No gallstones, gallbladder wall thickening, or biliary dilatation. Pancreas: Unremarkable. No pancreatic ductal dilatation or surrounding inflammatory changes. Spleen: Normal in size without focal abnormality. Adrenals/Urinary Tract: Adrenal glands are unremarkable. Kidneys are normal, without renal calculi, focal lesion, or hydronephrosis. Bladder is unremarkable. Stomach/Bowel: There is moderate diffuse distension of the stomach and pylorus. Abrupt caliber change with luminal narrowing is noted involving the proximal portion of the descending duodenum with mild surrounding soft tissue stranding and mucosal enhancement, image 50/5. Nonspecific. No pathologic dilatation of the large or small bowel loops. The appendix is not visualized and may be surgically absent. There are no secondary signs of acute appendicitis. Sigmoid diverticulosis without signs of acute diverticulitis. Vascular/Lymphatic: Aortic atherosclerosis. No enlarged abdominal or pelvic lymph nodes. Reproductive: Prostate gland enlargement. Other: No free fluid or fluid collections. Fat containing left inguinal hernia. Musculoskeletal: No acute or significant osseous findings. Multilevel degenerative disc disease. No acute or suspicious osseous findings. IMPRESSION: 1. There is moderate diffuse distension of the stomach and pylorus. Abrupt caliber change with luminal narrowing is noted involving the proximal portion of the descending duodenum with mild surrounding soft tissue stranding and mucosal enhancement. Findings are nonspecific and may represent a focal area of duodenitis. Underlying neoplasm cannot be excluded. Consider further evaluation with endoscopy. 2. Sigmoid diverticulosis without signs of acute  diverticulitis. 3. Prostate gland enlargement. 4. Fat containing left inguinal hernia. 5.  Aortic Atherosclerosis (ICD10-I70.0). Electronically Signed   By: Signa Kell M.D.   On: 05/08/2023 14:56   US Abdomen Complete  Result Date: 05/06/2023 CLINICAL DATA:  Epigastric pain EXAM: ABDOMEN ULTRASOUND COMPLETE COMPARISON:  None Available. FINDINGS: Gallbladder: No gallstones or wall thickening visualized. No sonographic Murphy sign noted by sonographer. Common bile duct: Diameter: 3 mm Liver: Increased echogenicity. No focal lesion. Portal vein is patent on color Doppler imaging with normal direction of blood flow towards the liver. IVC: No abnormality visualized. Pancreas: Visualized portion unremarkable. Spleen: Size and appearance within normal limits. Right Kidney: Length: 11.1 cm. Echogenicity within normal limits. No mass or hydronephrosis visualized. Left Kidney: Length: 11.5 cm. Echogenicity within normal limits. No mass or hydronephrosis visualized. Abdominal aorta: No aneurysm visualized. Other findings:  None. IMPRESSION: 1. Increased hepatic parenchymal echogenicity suggestive of steatosis. 2. No cholelithiasis or sonographic evidence for acute cholecystitis. Electronically Signed   By: Annia Belt M.D.   On: 05/06/2023 10:29     The results of significant diagnostics from this hospitalization (including imaging, microbiology, ancillary and laboratory) are listed below for reference.     Microbiology: No results found for this or any previous visit (from the past 240 hour(s)).   Labs: BNP (last 3 results) No results for input(s): "BNP" in the last 8760 hours. Basic Metabolic Panel: Recent Labs  Lab 05/10/23 0432 05/11/23 0457 05/12/23 0446 05/13/23 0445 05/14/23 0422  NA 137 136 139 138 136  K 4.3 4.1 4.4 3.7 3.6  CL 103 103 103 103 101  CO2 24 22 26 26 23   GLUCOSE 98 114* 113* 103* 100*  BUN 11 11 12 10 8   CREATININE 1.06 0.94 1.00 1.02 1.04  CALCIUM 8.7* 8.7* 9.0 8.8*  8.7*  MG 2.3 2.3 2.3 2.3 2.1  PHOS 3.5  --   --   --  3.5   Liver Function Tests: Recent Labs  Lab 05/08/23 1245 05/14/23 0422  AST 21 26  ALT 29 36  ALKPHOS 47 39  BILITOT 1.1 1.1  PROT 7.0 6.3*  ALBUMIN 3.9 3.5   Recent Labs  Lab 05/08/23 1122  LIPASE 48   No results for input(s): "AMMONIA" in the last 168 hours. CBC: Recent Labs  Lab 05/10/23 0432 05/11/23 0457 05/12/23 0446 05/13/23 0445 05/14/23 0422  WBC 14.0* 10.4 9.9 7.5 8.3  HGB 16.8 15.6 15.8 15.3 15.6  HCT 48.5 45.5 45.3 45.0 44.6  MCV 87.4 86.7 86.8 86.9 86.4  PLT 203 197 179 181 191   Cardiac Enzymes: No results for input(s): "CKTOTAL", "CKMB", "CKMBINDEX", "TROPONINI" in the last 168 hours. BNP: Invalid input(s): "POCBNP" CBG: No results for input(s): "GLUCAP" in the last 168 hours. D-Dimer No results for input(s): "DDIMER" in the last 72 hours. Hgb A1c Recent Labs    05/14/23 1051  HGBA1C 5.1   Lipid Profile No results for input(s): "CHOL", "HDL", "LDLCALC", "TRIG", "CHOLHDL", "LDLDIRECT" in the last 72 hours. Thyroid function studies No results for input(s): "TSH", "T4TOTAL", "T3FREE", "THYROIDAB" in the last 72 hours.  Invalid input(s): "FREET3" Anemia work up No results for input(s): "VITAMINB12", "FOLATE", "FERRITIN", "TIBC", "IRON", "RETICCTPCT" in the last 72 hours. Urinalysis    Component Value Date/Time   COLORURINE YELLOW 05/08/2023 1117   APPEARANCEUR CLEAR 05/08/2023 1117   LABSPEC >=1.030 05/08/2023 1117   PHURINE 5.5 05/08/2023 1117   GLUCOSEU NEGATIVE 05/08/2023 1117   GLUCOSEU NEGATIVE 06/24/2022 1103   HGBUR NEGATIVE 05/08/2023 1117   HGBUR negative 01/30/2010 0815   BILIRUBINUR SMALL (A) 05/08/2023 1117   KETONESUR 40 (A) 05/08/2023 1117   PROTEINUR 30 (A) 05/08/2023 1117   UROBILINOGEN 0.2 06/24/2022 1103   NITRITE NEGATIVE 05/08/2023 1117   LEUKOCYTESUR NEGATIVE 05/08/2023 1117   Sepsis Labs Recent Labs  Lab 05/11/23 0457 05/12/23 0446 05/13/23 0445  05/14/23 0422  WBC 10.4 9.9 7.5 8.3   Microbiology No results found for this or any previous visit (from the past 240 hour(s)).   Time coordinating discharge:  I have spent 35 minutes face to face with the patient and on the ward discussing the patients care, assessment, plan and disposition with other care givers. >50% of the time was devoted counseling the patient about the risks and benefits of treatment/Discharge disposition and coordinating care.   SIGNED:  [name], MD  [facility] 05/14/2023, 2:21 PM   If 7PM-7AM, please contact night-coverage

## 2023-05-14 NOTE — Progress Notes (Signed)
Initial Nutrition Assessment  DOCUMENTATION CODES:   Non-severe (moderate) malnutrition in context of acute illness/injury  INTERVENTION:   Monitor magnesium, potassium, and phosphorus for at least 3 days, MD to replete as needed, as pt is at risk for refeeding syndrome   -TPN management per Pharmacy -Recommend 100 mg Thiamine daily x 5 days -Daily weights while on TPN  NUTRITION DIAGNOSIS:   Moderate Malnutrition related to acute illness (duodenitis) as evidenced by mild fat depletion, energy intake < 75% for > 7 days.  GOAL:   Patient will meet greater than or equal to 90% of their needs  MONITOR:   Labs, Weight trends, I & O's (TPN)  REASON FOR ASSESSMENT:   Consult New TPN/TNA  ASSESSMENT:   82 year old with history of HLD comes to the hospital with complaints of epigastric pain ongoing for 2 weeks.  Had outpatient ultrasound on Thursday which was negative.  In the ED CT scan showed nonspecific inflammation/duodenitis.  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 pylori gastritis.  Patient and pt's wife at bedside. Pt anxious about PICC placement. Reports he last tolerated a normal meal 2 weeks ago. PTA pt was eating small meals of rice and cream of chicken soup or boiled eggs with toast. Pt has not been able to have diet advanced and has not been tolerating even clear liquids. LOS x 5. Noted plans to transfer to [facility] today. TPN needs below and recommendations above given refeeding syndrome risk given poor PO for >5 days now.   Pt reports UBW ~200 lbs. Current weight: 195 lbs.  Continue to monitor weights.  Medications: Lactated ringers  Labs reviewed.  NUTRITION - FOCUSED PHYSICAL EXAM:  Flowsheet Row Most Recent Value  Orbital Region No depletion  Upper Arm Region Mild depletion  Thoracic and Lumbar Region No depletion  Buccal Region  Mild depletion  Temple Region Mild depletion  Clavicle Bone Region No depletion  Clavicle and Acromion Bone Region No depletion  Scapular Bone Region No depletion  Dorsal Hand No depletion  Patellar Region Unable to assess  Anterior Thigh Region Unable to assess  Posterior Calf Region Unable to assess  Edema (RD Assessment) None  Hair Reviewed  Eyes Reviewed  Mouth Reviewed  Skin Reviewed       Diet Order:   Diet Order             Diet clear liquid Room service appropriate? Yes; Fluid consistency: Thin  Diet effective now                   EDUCATION NEEDS:   Education needs have been addressed  Skin:  Skin Assessment: Reviewed RN Assessment  Last BM:  8/17  Height:   Ht Readings from Last 1 Encounters:  05/08/23 6' (1.829 m)    Weight:   Wt Readings from Last 1 Encounters:  05/12/23 88.8 kg    BMI:  Body mass index is 26.54 kg/m.  Estimated Nutritional Needs:   Kcal:  2200-2400  Protein:  100-110g  Fluid:  2.2L/day   Tilda Franco, MS, RD, LDN Inpatient Clinical Dietitian Contact information available via Amion

## 2023-05-15 ENCOUNTER — Encounter (HOSPITAL_COMMUNITY): Payer: Self-pay | Admitting: Internal Medicine

## 2023-05-15 DIAGNOSIS — K311 Adult hypertrophic pyloric stenosis: Secondary | ICD-10-CM | POA: Diagnosis not present

## 2023-05-16 DIAGNOSIS — K311 Adult hypertrophic pyloric stenosis: Secondary | ICD-10-CM | POA: Diagnosis not present

## 2023-05-17 DIAGNOSIS — K315 Obstruction of duodenum: Secondary | ICD-10-CM | POA: Diagnosis not present

## 2023-05-17 DIAGNOSIS — K660 Peritoneal adhesions (postprocedural) (postinfection): Secondary | ICD-10-CM | POA: Diagnosis not present

## 2023-05-17 DIAGNOSIS — G8918 Other acute postprocedural pain: Secondary | ICD-10-CM | POA: Diagnosis not present

## 2023-05-17 DIAGNOSIS — C259 Malignant neoplasm of pancreas, unspecified: Secondary | ICD-10-CM | POA: Diagnosis not present

## 2023-05-17 DIAGNOSIS — C241 Malignant neoplasm of ampulla of Vater: Secondary | ICD-10-CM | POA: Diagnosis not present

## 2023-05-17 DIAGNOSIS — K311 Adult hypertrophic pyloric stenosis: Secondary | ICD-10-CM | POA: Diagnosis not present

## 2023-05-18 ENCOUNTER — Telehealth (HOSPITAL_BASED_OUTPATIENT_CLINIC_OR_DEPARTMENT_OTHER): Payer: Self-pay | Admitting: *Deleted

## 2023-05-18 DIAGNOSIS — K311 Adult hypertrophic pyloric stenosis: Secondary | ICD-10-CM | POA: Diagnosis not present

## 2023-05-19 DIAGNOSIS — K311 Adult hypertrophic pyloric stenosis: Secondary | ICD-10-CM | POA: Diagnosis not present

## 2023-05-20 DIAGNOSIS — K311 Adult hypertrophic pyloric stenosis: Secondary | ICD-10-CM | POA: Diagnosis not present

## 2023-05-21 ENCOUNTER — Telehealth: Payer: Self-pay | Admitting: *Deleted

## 2023-05-21 ENCOUNTER — Encounter: Payer: Self-pay | Admitting: *Deleted

## 2023-05-21 ENCOUNTER — Other Ambulatory Visit: Payer: Self-pay | Admitting: *Deleted

## 2023-05-21 DIAGNOSIS — K311 Adult hypertrophic pyloric stenosis: Secondary | ICD-10-CM | POA: Diagnosis not present

## 2023-05-21 NOTE — Progress Notes (Signed)
PATIENT NAVIGATOR PROGRESS NOTE  Name: Charles Lawson Date: 05/21/2023 MRN: [id number]  DOB: 04/30/1941   Reason for visit:  New Patient appt  Comments:  Spoke with Ms Douros and scheduled pt with Dr Truett Perna on 06/02/23 at !:10pm.  Directions to building and parking reviewed as well as contact number to call with any questions.  He is expected to be D/C from [facility] this coming Sunday-Tuesday    Time spent counseling/coordinating care: 30-45 minutes

## 2023-05-22 ENCOUNTER — Other Ambulatory Visit: Payer: Self-pay | Admitting: Internal Medicine

## 2023-05-24 DIAGNOSIS — R000 Tachycardia, unspecified: Secondary | ICD-10-CM | POA: Diagnosis not present

## 2023-05-24 DIAGNOSIS — K311 Adult hypertrophic pyloric stenosis: Secondary | ICD-10-CM | POA: Diagnosis not present

## 2023-05-24 DIAGNOSIS — I4510 Unspecified right bundle-branch block: Secondary | ICD-10-CM | POA: Diagnosis not present

## 2023-05-26 ENCOUNTER — Telehealth: Payer: Self-pay | Admitting: *Deleted

## 2023-05-26 ENCOUNTER — Encounter: Payer: Self-pay | Admitting: *Deleted

## 2023-05-26 NOTE — Transitions of Care (Post Inpatient/ED Visit) (Signed)
   05/26/2023  Name: Charles Lawson MRN: [id number] DOB: [birth year]  Today's TOC FU Call Status: Today's TOC FU Call Status:: Unsuccessful Call (1st Attempt) Unsuccessful Call (1st Attempt) Date: 05/26/23  Attempted to reach the patient regarding the most recent Inpatient visit; left HIPAA compliant voice message requesting call back  Follow Up Plan: Additional outreach attempts will be made to reach the patient to complete the Transitions of Care (Post Inpatient visit) call.   Caryl Pina, RN, BSN, CCRN Alumnus RN CM Care Coordination/ Transition of Care- [facility] Care Management [phone]: direct office

## 2023-05-27 ENCOUNTER — Encounter: Payer: Self-pay | Admitting: *Deleted

## 2023-05-27 ENCOUNTER — Telehealth: Payer: Self-pay | Admitting: *Deleted

## 2023-05-27 NOTE — Transitions of Care (Post Inpatient/ED Visit) (Signed)
05/27/2023  Name: Charles Lawson MRN: [id number] DOB: [birth year]  Today's TOC FU Call Status: Today's TOC FU Call Status:: Successful TOC FU Call Completed TOC FU Call Complete Date: 05/27/23 Patient's Name and Date of Birth confirmed.  Transition Care Management Follow-up Telephone Call Date of Discharge: 05/24/23 Discharge Facility: Other ([facility]) Name of Other ([facility]) Discharge Facility: [facility] Type of Discharge: Inpatient Admission Primary Inpatient Discharge Diagnosis:: gastric outlet obstruction/ duodenal obstruction/ exploratory laparotomy- Whipple procedure How have you been since you were released from the hospital?: Better (per spouse: "Everything is going okay; he is able to walk a little bit around the house and down the driveway; eating and pooping/ peeing okay.  Pain has been manageable.  He has an appointment with the surgeon and with the cancer doctor scheduled") Any questions or concerns?: No  Items Reviewed: Did you receive and understand the discharge instructions provided?: Yes (briefly reviewed with patient's spouse who verbalizes good understanding of same - outside hospital AVS) Medications obtained,verified, and reconciled?: Yes (Medications Reviewed) (Full medication reconciliation/ review completed; no concerns or discrepancies identified; confirmed patient obtained/ is taking all newly Rx'd medications as instructed; spouse-manages medications and denies questions/ concerns around medications today) Any new allergies since your discharge?: No Dietary orders reviewed?: Yes Type of Diet Ordered:: "Low fat after this surgery" Do you have support at home?: Yes People in Home: spouse Name of Support/Comfort Primary Source: Reports independent in self-care activities; supportive spouse assists as/ if needed/ indicated  Medications Reviewed Today: Medications Reviewed Today     Reviewed by Michaela Corner, RN (Registered Nurse) on 05/27/23 at 1030  Med  List Status: <None>   Medication Order Taking? Sig Documenting Provider Last Dose Status Informant  acetaminophen (TYLENOL) 650 MG CR tablet [id number] Yes Take 500 mg by mouth every 8 (eight) hours as needed for pain. 05/27/23: reports during [facility] call taking after recent surgery at [facility] Corwin Levins, MD Taking Active Spouse/Significant Other           Med Note Jonnie Kind May 27, 2023 10:16 AM) 05/27/23: reports during [facility] call taking after recent surgery at [facility]   enoxaparin (LOVENOX) 40 MG/0.4ML injection [id number] Yes Inject 40 mg into the skin daily. 05/27/23: reports during [facility] call taking after recent surgery at [facility]- to take for 14 days post-op Corwin Levins, MD Taking Active Spouse/Significant Other           Med Note Jonnie Kind May 27, 2023 10:18 AM) 05/27/23: reports during [facility] call taking after recent surgery at [facility] x 14 days   lipase/protease/amylase (CREON) 12000-38000 units CPEP capsule [id number] Yes Take 12,000 Units by mouth 3 (three) times daily before meals. 05/27/23: reports during [facility] call taking after recent surgery at [facility] Corwin Levins, MD Taking Active Spouse/Significant Other           Med Note Jonnie Kind May 27, 2023 10:17 AM) 05/27/23: reports during [facility] call taking after recent surgery at [facility]   omeprazole (PRILOSEC OTC) 20 MG tablet [id number] Yes Take 20 mg by mouth daily. 05/27/23: reports during [facility] call taking after recent surgery at [facility] Corwin Levins, MD Taking Active Spouse/Significant Other           Med Note Jonnie Kind May 27, 2023 10:19 AM) 05/27/23: reports during [facility] call taking after recent surgery at [facility]   ondansetron (ZOFRAN) 4 MG tablet  [id number] Yes Take 1 tablet (4 mg total) by mouth every 8 (eight) hours as needed for nausea or vomiting.  Patient taking differently: Take 4 mg by mouth as needed for nausea or vomiting.   Myrlene Broker, MD Taking Active Self, Pharmacy Records           Med Note  Jonnie Kind May 27, 2023 10:21 AM) 05/27/23: reports during [facility] call taking after recent surgery at [facility]   oxyCODONE (OXY IR/ROXICODONE) 5 MG immediate release tablet [id number] Yes Take 5 mg by mouth every 4 (four) hours as needed for severe pain. 05/27/23: reports during [facility] call taking after recent surgery at [facility] Corwin Levins, MD Taking Active Spouse/Significant Other           Med Note Jonnie Kind May 27, 2023 10:22 AM) 05/27/23: reports during [facility] call taking after recent surgery at [facility]   pantoprazole (PROTONIX) 40 MG tablet [id number] No Take 1 tablet (40 mg total) by mouth daily.  Patient not taking: Reported on 05/27/2023   Myrlene Broker, MD Not Taking Active Self, Pharmacy Records  tamsulosin [facility]) 0.4 MG CAPS capsule [id number] Yes Take 0.4 mg by mouth daily. 05/27/23: reports during [facility] call taking after recent surgery at [facility] Corwin Levins, MD Taking Active Spouse/Significant Other           Med Note Jonnie Kind May 27, 2023 10:20 AM) 05/27/23: reports during [facility] call taking after recent surgery at [facility] and Equipment/Supplies: Were Home Health Services Ordered?: No Any new equipment or medical supplies ordered?: No  Functional Questionnaire: Do you need assistance with bathing/showering or dressing?: No (wife supervises after recent surgery) Do you need assistance with meal preparation?: Yes (wife prepares meals) Do you need assistance with eating?: No Do you have difficulty maintaining continence: No Do you need assistance with getting out of bed/getting out of a chair/moving?: No (wife supervises after recent surgery) Do you have difficulty managing or taking your medications?: Yes (wife managing all aspects of medication administration after recent surgery)  Follow up appointments reviewed: PCP Follow-up appointment confirmed?: NA (verified not indicated per hospital discharging provider discharge  notes) Specialist Hospital Follow-up appointment confirmed?: Yes Date of Specialist follow-up appointment?: 06/15/23 (verified this is recommended time frame for follow up per hospital discharging provider notes) Follow-Up Specialty Provider:: Surgical provider at [facility]; also verified oncology provider appointment on 06/02/23 Do you need transportation to your follow-up appointment?: No Do you understand care options if your condition(s) worsen?: Yes-patient verbalized understanding  SDOH Interventions Today    Flowsheet Row Most Recent Value  SDOH Interventions   Food Insecurity Interventions Intervention Not Indicated  Transportation Interventions Intervention Not Indicated  [normally drives self,  spouse assisting post-recent surgery]      TOC Interventions Today    Flowsheet Row Most Recent Value  TOC Interventions   TOC Interventions Discussed/Reviewed TOC Interventions Discussed, Post op wound/incision care, S/S of infection      Interventions Today    Flowsheet Row Most Recent Value  Chronic Disease   Chronic disease during today's visit Other  [gastric obstruction with surgical Whipple procedure]  General Interventions   General Interventions Discussed/Reviewed General Interventions Discussed, Durable Medical Equipment (DME), Doctor Visits  Doctor Visits Discussed/Reviewed PCP, Specialist, Doctor Visits Discussed  Durable Medical Equipment (DME) Val Riles currently requiring/ using assistive devices  post-recent surgery,  does not use at baseline]  PCP/Specialist Visits Compliance with follow-up visit  Exercise Interventions   Exercise Discussed/Reviewed Exercise Discussed  [benefits of light activity/ walking post-recent surgery with precautions to not over-do activity]  Nutrition Interventions   Nutrition Discussed/Reviewed Nutrition Discussed  Pharmacy Interventions   Pharmacy Dicussed/Reviewed Pharmacy Topics Discussed  [Full medication review with updating  medication list in EHR per patient report]  Safety Interventions   Safety Discussed/Reviewed Safety Discussed, Fall Risk      Caryl Pina, RN, BSN, CCRN Alumnus RN CM Care Coordination/ Transition of Care- [facility] Care Management [phone]: direct office

## 2023-06-02 ENCOUNTER — Encounter: Payer: Self-pay | Admitting: Oncology

## 2023-06-02 ENCOUNTER — Inpatient Hospital Stay: Payer: Medicare Other | Attending: Oncology | Admitting: Oncology

## 2023-06-02 ENCOUNTER — Other Ambulatory Visit: Payer: Self-pay | Admitting: *Deleted

## 2023-06-02 ENCOUNTER — Ambulatory Visit (HOSPITAL_BASED_OUTPATIENT_CLINIC_OR_DEPARTMENT_OTHER)
Admission: RE | Admit: 2023-06-02 | Discharge: 2023-06-02 | Disposition: A | Discharge status: Home / Self Care | Payer: Medicare Other | Source: Ambulatory Visit | Attending: Oncology | Admitting: Oncology

## 2023-06-02 ENCOUNTER — Encounter: Payer: Self-pay | Admitting: *Deleted

## 2023-06-02 VITALS — BP 127/73 | HR 110 | Temp 98.1°F | Resp 18 | Ht 72.0 in | Wt 179.0 lb

## 2023-06-02 DIAGNOSIS — C241 Malignant neoplasm of ampulla of Vater: Secondary | ICD-10-CM

## 2023-06-02 DIAGNOSIS — I700 Atherosclerosis of aorta: Secondary | ICD-10-CM | POA: Diagnosis not present

## 2023-06-02 DIAGNOSIS — J9811 Atelectasis: Secondary | ICD-10-CM | POA: Diagnosis not present

## 2023-06-02 NOTE — Progress Notes (Signed)
[facility] New Patient Consult   Requesting MD: Rise Mu, [facility] [address] [place],  [place] [address]   Charles Lawson 82 y.o.  [birth year]    Reason for Consult: Ampullary carcinoma   HPI: Mr: Charles Lawson presented to the emergency room on 17 2024 with a 2-week history of upper abdominal discomfort, postprandial bloating, and nausea.  He reports increased belching.  A CT of the abdomen and pelvis 05/08/2023 revealed moderate distention of the stomach and pylorus with an abrupt caliber change with luminal narrowing at the proximal portion of the descending duodenum with mild surrounding soft tissue stranding and mucosal enhancement.  Gastroenterology was consulted and he was taken to an upper endoscopy about Leone Payor on 05/10/2023.  A stenosis was found in the second portion of the duodenum.  The stenosis was traversed with the ultraslim gastroscope and biopsies were obtained.  The stomach was inflamed.  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 pylori. He was taken to a repeat upper endoscopy on 05/12/2023.  A severe stenosis was found in the second portion of the duodenum.  Biopsies were obtained.  The gastroscope could not pass completely through the stricture.  The pathology revealed no evidence of residual adenoma.  No malignancy.  He could not tolerate clear liquids.  An NG tube was placed.  He was started on TPN.  He was transferred to [facility] 05/14/2023.  He was taken to the operating room by Dr. Marilynn Rail on 05/17/2023 for a Whipple procedure.  There was no evidence of metastatic disease.  A duodenal mass appeared "malignant".  He was discharged in the hospital on 05/24/2023.  He has not regained his full activity level.  He is eating and having bowel movements.  The postprandial bloating he experienced prior to surgery has resolved.  He  has occasional "gas bubble "discomfort.  The pathology from the pancreaticoduodenectomy revealed an intra ampullary moderately differentiated adenocarcinoma.  Tumor extended greater than 0.5 cm into the pancreas and extended into peripancreatic soft tissue and.  Duodenal tissue.  Lymphovascular and perineural invasion are present.  Resection margins are negative.  3 of 13 lymph nodes contained metastatic carcinoma.   Past Medical History:  Diagnosis Date   Allergic rhinitis, cause unspecified    Arthritis    Blood pressure elevated without history of HTN    BPH (benign prostatic hyperplasia)    Diverticulosis of colon    ED (erectile dysfunction)    History of basal cell carcinoma (BCC) excision    Hyperlipidemia    Impaired glucose tolerance 04/03/2013   Personal history of colonic polyps 06/07/2012    Past Surgical History:  Procedure Laterality Date   Quintella Reichert OSTEOTOMY Left 07/19/2020   Procedure: Quintella Reichert OSTEOTOMY;  Surgeon: Edwin Cap, DPM;  Location: [facility];  Service: Podiatry;  Laterality: Left;   BIOPSY  05/10/2023   Procedure: BIOPSY;  Surgeon: Iva Boop, MD;  Location: [facility];  Service: Gastroenterology;;   BIOPSY  05/12/2023   Procedure: BIOPSY;  Surgeon: Iva Boop, MD;  Location: [facility];  Service: Gastroenterology;;   Arbutus Leas Left 07/19/2020   Procedure: Delos Haring;  Surgeon: Edwin Cap, DPM;  Location: [facility];  Service: Podiatry;  Laterality: Left;   COLONOSCOPY  last one 10-06-2017  dr Leone Payor   ESOPHAGOGASTRODUODENOSCOPY N/A 05/12/2023   Procedure: ESOPHAGOGASTRODUODENOSCOPY (EGD);  Surgeon: Leone Payor,  Maryjean Morn, MD;  Location: [facility];  Service: Gastroenterology;  Laterality: N/A;   ESOPHAGOGASTRODUODENOSCOPY (EGD) WITH PROPOFOL N/A 05/10/2023   Procedure: ESOPHAGOGASTRODUODENOSCOPY (EGD) WITH PROPOFOL;  Surgeon: Iva Boop, MD;  Location: [facility];  Service: Gastroenterology;   Laterality: N/A;   HAMMER TOE SURGERY Left 07/19/2020   Procedure: HAMMER TOE CORRECTION;  Surgeon: Edwin Cap, DPM;  Location: [facility];  Service: Podiatry;  Laterality: Left;   INGUINAL HERNIA REPAIR Right 11-21-2008  @ [facility]   LUMBAR LAMINECTOMY  1974   METATARSAL OSTEOTOMY Left 07/19/2020   Procedure: METATARSAL OSTEOTOMY;  Surgeon: Edwin Cap, DPM;  Location: [facility];  Service: Podiatry;  Laterality: Left;   ORIF TOE FRACTURE Left 07/19/2020   Procedure: OPEN TREATMENT OF METARSAL PHALANGEAL JOINT DISLOCATION;  Surgeon: Edwin Cap, DPM;  Location: [facility];  Service: Podiatry;  Laterality: Left;  ANKLE BLOCK WITH THIGH TOURNIQUET    Medications: Reviewed  Allergies:  Allergies  Allergen Reactions   Penicillins Other (See Comments)    Unknown childhood reaction    Family history: His father had lung cancer and was a smoker.  Social History:   He lives with his wife in [place].  He is retired from the post office.  He does not use cigarettes or alcohol.  No transfusion history.  No risk factor for HIV or hepatitis  ROS:   Positives include: Postprandial "gas bubble "discomfort, weakness requiring ambulation with a walker/cane following the Whipple procedure  A complete ROS was otherwise negative.  Physical Exam:  Blood pressure 127/73, pulse (!) 110, temperature 98.1 F (36.7 C), temperature source Oral, resp. rate 18, height 6' (1.829 m), weight 179 lb (81.2 kg), SpO2 99%.  HEENT: Oropharynx without visible mass, neck without mass Lungs: Clear bilaterally Cardiac: Regular rate and rhythm Abdomen: No hepatosplenomegaly, no mass, healing midline incision with Steri-Strips in place  Vascular: No leg edema Lymph nodes: No cervical, supraclavicular, axillary, or inguinal nodes Neurologic: Alert and oriented, the motor exam appears intact in the upper and lower extremities bilaterally Skin: No rash,  multiple benign-appearing moles over the trunk, skin thickening and mild erythema in a sun exposure distribution, ecchymoses at the low abdominal wall Musculoskeletal: No spine tenderness   LAB:  CBC  Lab Results  Component Value Date   WBC 8.3 05/14/2023   HGB 15.6 05/14/2023   HCT 44.6 05/14/2023   MCV 86.4 05/14/2023   PLT 191 05/14/2023   NEUTROABS 3.6 06/24/2022        CMP  Lab Results  Component Value Date   NA 136 05/14/2023   K 3.6 05/14/2023   CL 101 05/14/2023   CO2 23 05/14/2023   GLUCOSE 100 (H) 05/14/2023   BUN 8 05/14/2023   CREATININE 1.04 05/14/2023   CALCIUM 8.7 (L) 05/14/2023   PROT 6.3 (L) 05/14/2023   ALBUMIN 3.5 05/14/2023   AST 26 05/14/2023   ALT 36 05/14/2023   ALKPHOS 39 05/14/2023   BILITOT 1.1 05/14/2023   GFRNONAA >60 05/14/2023   GFRAA  11/19/2008    >60        The eGFR has been calculated using the MDRD equation. This calculation has not been validated in all clinical situations. eGFR's persistently <60 mL/min signify possible Chronic Kidney Disease.     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 pylori gastritis 05/10/2023-we will confirm treatment plan with Dr. 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  He underwent a pancreaticoduodenectomy procedure 05/17/2023.  I reviewed details of the surgery pathology report and discussed the prognosis with Mr.Szuch and his wife.  We discussed adjuvant therapy options.  He has a significant risk  of developing recurrent ampullary carcinoma over the next several years.  I recommend adjuvant therapy.  We discussed there is no clear "standard "best approach for adjuvant therapy in patients with resected ampullary carcinoma.  The goal of adjuvant therapy is to prolong disease-free survival and potentially increase the cure rate.  I recommend adjuvant chemotherapy.  I think it would be difficult for Mr Fauteux to tolerate FOLFIRINOX given his age and status post pancreaticoduodenectomy.  I recommend gemcitabine/capecitabine.  We reviewed potential toxicities associated with the gemcitabine/capecitabine regimen including the chance of nausea, mucositis, diarrhea, alopecia, hematologic toxicity, infection, and bleeding.  We discussed the rash, fever, and pneumonitis associated with gemcitabine.  We discussed the sun sensitivity, rash, hand/foot syndrome, hyperpigmentation, and cardiac toxicity seen with capecitabine.  He will attend a chemotherapy class.  He agrees to proceed with gemcitabine/capecitabine.  Mr Edgecomb will be referred for Port-A-Cath placement and a staging chest CT.  He will be scheduled for an office visit and cycle 1 gemcitabine/capecitabine on 06/23/2023.  Thornton Papas, MD  06/02/2023, 3:08 PM

## 2023-06-02 NOTE — Progress Notes (Signed)
PATIENT NAVIGATOR PROGRESS NOTE  Name: Charles Lawson Date: 06/02/2023 MRN: [id number]  DOB: 1941-05-05   Reason for visit:  New patient appt  Comments:  Met with Mr and [name] during initial appt with Dr Truett Perna Education for [facility] placement and scheduled for 06/18/23 Referral placed for Social work Referral placed for Nutrition Pt will have CT Chest for complete staging Given written information on Gemzar/Capecitabine Will attend Patient ed session on 9/20 and have pre treatment labs Given Journeys notebook with disease specific information Given contact number to call with any questions    Time spent counseling/coordinating care: > 60 minutes

## 2023-06-02 NOTE — Progress Notes (Signed)
Mr. Helmich denies any need for therapy or medication at this time. More due to unknown of his cancer and treatment. Has family and friends he can talk to. Not suicidal

## 2023-06-02 NOTE — Progress Notes (Signed)
START OFF PATHWAY REGIMEN - Other   [id number]:Capecitabine 830 mg/m2 PO BID D1-21 + Gemcitabine 1,000 mg/m2 IV D1,8,15 q28 Days for up to 6 Cycles:   A cycle is every 28 days:     Capecitabine      Gemcitabine   **Always confirm dose/schedule in your pharmacy ordering system**  Patient Characteristics: Intent of Therapy: Curative Intent, Discussed with Patient

## 2023-06-02 NOTE — Progress Notes (Signed)
refe

## 2023-06-03 ENCOUNTER — Inpatient Hospital Stay: Payer: Medicare Other

## 2023-06-03 NOTE — Progress Notes (Signed)
[facility] Clinical Social Work  Clinical Social Work was referred by nurse for assessment of psychosocial needs.  Clinical Social Worker contacted patient by phone to offer support and assess for needs. CSW spoke with patient who requested CSW speak with patient's wife.  CSW introduced herself and explained purpose for call. Patient's wife reported that patient's mood has improved since appointment with provider and that he has been enjoying walking and doing better. CSW reviewed SDOh, no needs at this time. CSW provided contact information in case needs change.   Marguerita Merles, LCSWA Clinical Social Worker [facility]

## 2023-06-04 ENCOUNTER — Other Ambulatory Visit: Payer: Self-pay

## 2023-06-07 ENCOUNTER — Telehealth: Payer: Self-pay | Admitting: Internal Medicine

## 2023-06-07 NOTE — Telephone Encounter (Signed)
Patient had H pylori found at EGD last month - also had cancer of ampulla that was removed by surgery at [facility]  He still needs treatment for H pylori  Please contact the patient and get an accurate med list - it currently lists omeprazole and protonix  He needs to follow the treatment plan and f/u testing below, please:  1) Omeprazole 20 mg 2 times a day x 14 d or the daily protonix 40 mg 2) Pepto Bismol 2 tabs (262 mg each) 4 times a day x 14 d 3) Metronidazole 250 mg 4 times a day x 14 d 4) doxycycline 100 mg 2 times a day x 14 d    In 4 weeks after treatment completed do H. Pylori stool antigen ([employer])- dx H. Pylori gastritis   He can continue omeprazole or protonix at prior dosing after aking the Abx and PPI

## 2023-06-08 ENCOUNTER — Other Ambulatory Visit: Payer: Self-pay

## 2023-06-08 DIAGNOSIS — B9681 Helicobacter pylori [H. pylori] as the cause of diseases classified elsewhere: Secondary | ICD-10-CM

## 2023-06-08 MED ORDER — BISMUTH SUBSALICYLATE 262 MG PO CHEW
524.0000 mg | CHEWABLE_TABLET | Freq: Four times a day (QID) | ORAL | 0 refills | Status: AC
Start: 2023-06-08 — End: 2023-06-22

## 2023-06-08 MED ORDER — DOXYCYCLINE HYCLATE 100 MG PO CAPS
100.0000 mg | ORAL_CAPSULE | Freq: Two times a day (BID) | ORAL | 0 refills | Status: AC
Start: 2023-06-08 — End: 2023-06-22

## 2023-06-08 MED ORDER — METRONIDAZOLE 250 MG PO TABS
250.0000 mg | ORAL_TABLET | Freq: Four times a day (QID) | ORAL | 0 refills | Status: AC
Start: 2023-06-08 — End: 2023-06-22

## 2023-06-08 NOTE — Telephone Encounter (Signed)
Pt and pt wife Corrie Dandy made aware of recent results and Dr. Leone Payor recommendations: Prescriptions sent to pharmacy. Mary made aware.  Corrie Dandy stated that pt was not taking the Protonix only the Omeprazole. Protonix removed from medication list.  Detailed instructions were give to [facility] about the medications and retesting.  H. Pylori stool antigen ([employer])-  Kit prepared. Mary notified to pick up on the second floor. Mary notified to continue omeprazole at prior dosing after aking the Abx and PPI. Pt verbalized understanding with all questions answered.

## 2023-06-09 ENCOUNTER — Encounter: Payer: Self-pay | Admitting: Nutrition

## 2023-06-09 ENCOUNTER — Inpatient Hospital Stay: Payer: Medicare Other | Admitting: Nutrition

## 2023-06-09 NOTE — Progress Notes (Signed)
Patient did not show up for scheduled nutrition appointment. I rescheduled him to Wednesday, October 2.

## 2023-06-10 ENCOUNTER — Telehealth: Payer: Self-pay

## 2023-06-10 ENCOUNTER — Encounter: Payer: Self-pay | Admitting: Oncology

## 2023-06-10 ENCOUNTER — Telehealth: Payer: Self-pay | Admitting: Pharmacist

## 2023-06-10 ENCOUNTER — Other Ambulatory Visit (HOSPITAL_COMMUNITY): Payer: Self-pay

## 2023-06-10 DIAGNOSIS — C241 Malignant neoplasm of ampulla of Vater: Secondary | ICD-10-CM

## 2023-06-10 NOTE — Telephone Encounter (Signed)
Oral Oncology Patient Advocate Encounter  After completing a benefits investigation, prior authorization for Capecitabine is not required at this time through [employer] Medicare Part D.  Patient's copay is $75.00.     Ardeen Fillers, CPhT Oncology Pharmacy Patient Advocate  [facility]  [phone] (phone) [phone] (fax) 06/10/2023 2:13 PM

## 2023-06-11 ENCOUNTER — Other Ambulatory Visit: Payer: Self-pay | Admitting: *Deleted

## 2023-06-11 ENCOUNTER — Inpatient Hospital Stay: Payer: Medicare Other

## 2023-06-11 MED ORDER — PROCHLORPERAZINE MALEATE 10 MG PO TABS: 10.0000 mg | ORAL_TABLET | Freq: Four times a day (QID) | ORAL | 0 refills | Status: DC | PRN

## 2023-06-11 MED ORDER — LIDOCAINE-PRILOCAINE 2.5-2.5 % EX CREA: 1.0000 | TOPICAL_CREAM | CUTANEOUS | 0 refills | Status: DC | PRN

## 2023-06-12 ENCOUNTER — Other Ambulatory Visit: Payer: Self-pay | Admitting: Oncology

## 2023-06-14 ENCOUNTER — Encounter: Payer: Self-pay | Admitting: Oncology

## 2023-06-14 ENCOUNTER — Other Ambulatory Visit (HOSPITAL_COMMUNITY): Payer: Self-pay

## 2023-06-14 ENCOUNTER — Telehealth: Payer: Self-pay

## 2023-06-14 NOTE — Telephone Encounter (Signed)
Patient gave verbal understanding and had no further questions or concerns  

## 2023-06-14 NOTE — Telephone Encounter (Signed)
Oral Oncology Patient Advocate Encounter   Was successful in securing patient an $3,800.00 grant from [employer] [facility]) to provide copayment coverage for Capecitabine.  This will keep the out of pocket expense at $0.     I have spoken with the patient.    The billing information is as follows and has been shared with [facility].   Member ID: [id number] Group ID: [id number] RxBin: [id number] Dates of Eligibility: 03/15/23 through 06/11/24  Fund:  Biliary Tract Cancer   Ardeen Fillers, CPhT Oncology Pharmacy Patient Advocate  [facility]  [phone] (phone) [phone] (fax) 06/14/2023 9:48 AM

## 2023-06-14 NOTE — Telephone Encounter (Addendum)
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  Labs from 8/23 assessed, CBC and BMP without concern for requiring dose reduction or hold of medications. Prescription dose and frequency assessed.   Current medication list in Epic reviewed, 2 DDIs with Capecitabine (Xeloda) identified: Category C: Omeprazole for treatment of H. Pylori then will return to previous maintenance therapy. Monitor for decreased effectiveness of Capecitabine (Xeloda) while on therapy and discuss with patients if there's any opportunity to de-escalate to an H2RA with or without antacids. Dose adjustments not required at this time.  Category C: Ondansetron. Medication is a short-term PRN and commonly used to treat cancer related N/V. Monitor for QTc prolongation. Last EKG 05/08/23 was 445. Dose adjustments not required at this time. Continue to monitor for cardio changes if consistently used or chose a different option like compazine for future control of N/V. Highest risk of QTc prolongation is with the IV formulation.   Evaluated chart and no patient barriers to medication adherence identified.   Prescription has been e-scribed to the [facility] for benefits analysis and approval.  Oral Oncology Clinic will continue to follow for insurance authorization, copayment issues, initial counseling and start date.  Patient agreed to treatment on 06/02/2023 per MD documentation.  Gara Kroner, PharmD PGY1 Pharmacy Resident - [facility] [phone]  06/14/2023 9:18 AM

## 2023-06-14 NOTE — Telephone Encounter (Signed)
-----   Message from Thornton Papas sent at 06/12/2023  7:15 AM EDT ----- Please call patient, chest CT shows no evidence of cancer, f/u as scheduled, remind me to prescribe xeloda to start 10/2

## 2023-06-14 NOTE — Telephone Encounter (Addendum)
Called patient to obtain financial information to get grant to make co-pay $0.00.  1st attempt - Left VM 06/14/23  Patient's wife, Corrie Dandy, called back and provided financial information for grant.   Grant obtained to make co-pay $0.00.   Ardeen Fillers, CPhT Oncology Pharmacy Patient Advocate  [facility]  [phone] (phone) [phone] (fax) 06/14/2023 9:19 AM

## 2023-06-15 DIAGNOSIS — C241 Malignant neoplasm of ampulla of Vater: Secondary | ICD-10-CM | POA: Diagnosis not present

## 2023-06-15 NOTE — Progress Notes (Signed)
Pharmacist Chemotherapy Monitoring - Initial Assessment    Anticipated start date: 06/23/23   The following has been reviewed per standard work regarding the patient's treatment regimen: The patient's diagnosis, treatment plan and drug doses, and organ/hematologic function Lab orders and baseline tests specific to treatment regimen  The treatment plan start date, drug sequencing, and pre-medications Prior authorization status  Patient's documented medication list, including drug-drug interaction screen and prescriptions for anti-emetics and supportive care specific to the treatment regimen The drug concentrations, fluid compatibility, administration routes, and timing of the medications to be used The patient's access for treatment and lifetime cumulative dose history, if applicable  The patient's medication allergies and previous infusion related reactions, if applicable   Changes made to treatment plan:  N/A  Follow up needed:  N/A   Charles Lawson, RPH, 06/15/2023  12:22 PM

## 2023-06-16 ENCOUNTER — Other Ambulatory Visit: Payer: Self-pay | Admitting: Radiology

## 2023-06-17 ENCOUNTER — Other Ambulatory Visit: Payer: Self-pay

## 2023-06-17 ENCOUNTER — Other Ambulatory Visit: Payer: Self-pay | Admitting: Oncology

## 2023-06-17 ENCOUNTER — Other Ambulatory Visit (HOSPITAL_COMMUNITY): Payer: Self-pay

## 2023-06-17 MED ORDER — CAPECITABINE 500 MG PO TABS
ORAL_TABLET | ORAL | 0 refills | Status: DC
Start: 2023-06-23 — End: 2023-06-17

## 2023-06-17 MED ORDER — CAPECITABINE 500 MG PO TABS
1500.0000 mg | ORAL_TABLET | Freq: Two times a day (BID) | ORAL | 0 refills | Status: DC
  Filled 2023-06-17: qty 126, 28d supply, fill #0

## 2023-06-17 NOTE — Progress Notes (Signed)
Specialty Pharmacy Initial Fill Coordination Note  Charles Lawson is a 82 y.o. male contacted today regarding refills of specialty medication(s) Capecitabine .  Patient requested Delivery  on 06/22/23  to verified address [address] [place], [place] [address]   Medication will be filled on 06/21/23.   Patient is aware of $0.00 copayment.

## 2023-06-17 NOTE — Consult Note (Signed)
Chief Complaint: Patient was seen in consultation today for Port-A-Cath placement  Referring Physician(s): Sherrill,Gary B  Supervising Physician: Mir, Mauri Reading  Patient Status: [facility] - Out-pt  History of Present Illness: Charles Lawson is a 82 y.o. male with past medical history significant for arthritis, BPH, diverticulosis, skin cancer, hyperlipidemia, impaired glucose tolerance, colon polyps, H. 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  He is scheduled today for Port-A-Cath placement to assist with treatment.  Past Medical History:  Diagnosis Date   Allergic rhinitis, cause unspecified    Arthritis    Blood pressure elevated without history of HTN    BPH (benign prostatic hyperplasia)    Diverticulosis of colon    ED (erectile dysfunction)    History of basal cell carcinoma (BCC) excision    Hyperlipidemia    Impaired glucose tolerance 04/03/2013   Personal history of colonic polyps 06/07/2012    Past Surgical History:  Procedure Laterality Date   Quintella Reichert OSTEOTOMY Left 07/19/2020   Procedure: Quintella Reichert OSTEOTOMY;  Surgeon: Edwin Cap, DPM;  Location: [facility];  Service: Podiatry;  Laterality: Left;   BIOPSY  05/10/2023   Procedure: BIOPSY;  Surgeon: Iva Boop, MD;  Location: [facility];  Service: Gastroenterology;;   BIOPSY  05/12/2023   Procedure: BIOPSY;  Surgeon: Iva Boop, MD;  Location: [facility];  Service: Gastroenterology;;   Arbutus Leas Left 07/19/2020   Procedure: Delos Haring;  Surgeon: Edwin Cap, DPM;  Location: [facility];  Service: Podiatry;  Laterality: Left;   COLONOSCOPY  last one 10-06-2017  dr Leone Payor   ESOPHAGOGASTRODUODENOSCOPY N/A 05/12/2023   Procedure: ESOPHAGOGASTRODUODENOSCOPY (EGD);  Surgeon: Iva Boop, MD;  Location: [facility];  Service: Gastroenterology;  Laterality: N/A;    ESOPHAGOGASTRODUODENOSCOPY (EGD) WITH PROPOFOL N/A 05/10/2023   Procedure: ESOPHAGOGASTRODUODENOSCOPY (EGD) WITH PROPOFOL;  Surgeon: Iva Boop, MD;  Location: [facility];  Service: Gastroenterology;  Laterality: N/A;   HAMMER TOE SURGERY Left 07/19/2020   Procedure: HAMMER TOE CORRECTION;  Surgeon: Edwin Cap, DPM;  Location: [facility];  Service: Podiatry;  Laterality: Left;   INGUINAL HERNIA REPAIR Right 11-21-2008  @ [facility]   LUMBAR LAMINECTOMY  1974   METATARSAL OSTEOTOMY Left 07/19/2020   Procedure: METATARSAL OSTEOTOMY;  Surgeon: Edwin Cap, DPM;  Location: [facility];  Service: Podiatry;  Laterality: Left;   ORIF TOE FRACTURE Left 07/19/2020   Procedure: OPEN TREATMENT OF METARSAL PHALANGEAL JOINT DISLOCATION;  Surgeon: Edwin Cap, DPM;  Location: [facility];  Service: Podiatry;  Laterality: Left;  ANKLE BLOCK WITH THIGH TOURNIQUET    Allergies: Penicillins  Medications: Prior to Admission medications   Medication Sig Start Date End Date Taking? Authorizing Provider  bismuth subsalicylate (PEPTO-BISMOL) 262 MG chewable tablet Chew 2 tablets (524 mg total) by mouth QID for 14 days. 06/08/23 06/22/23  Iva Boop, MD  doxycycline (VIBRAMYCIN) 100 MG capsule Take 1 capsule (100 mg total) by mouth 2 (two) times daily for 14 days. 06/08/23 06/22/23  Iva Boop, MD  metroNIDAZOLE (FLAGYL) 250 MG tablet Take 1 tablet (250 mg total) by mouth 4 (four) times daily for 14 days. 06/08/23 06/22/23  Iva Boop, MD  acetaminophen (TYLENOL) 650 MG CR tablet Take 500 mg by mouth every 8 (eight) hours as needed for pain. 05/27/23: reports during [facility] call taking after recent surgery at [facility] Patient not taking: Reported on 06/02/2023  Corwin Levins, MD  capecitabine (XELODA) 500 MG tablet Take 3 tablets (1,500 mg total) by mouth 2 (two) times daily after a meal. Take for 21 days, then hold for 7 days. Repeat every 28 days.  06/17/23   Ladene Artist, MD  enoxaparin (LOVENOX) 40 MG/0.4ML injection Inject 40 mg into the skin daily. 05/27/23: reports during [facility] call taking after recent surgery at [facility]- to take for 14 days post-op    Corwin Levins, MD  lidocaine-prilocaine (EMLA) cream Apply 1 Application topically as needed. 06/11/23   Ladene Artist, MD  lipase/protease/amylase (CREON) 12000-38000 units CPEP capsule Take 12,000 Units by mouth 3 (three) times daily before meals. 05/27/23: reports during [facility] call taking after recent surgery at [facility]    Corwin Levins, MD  omeprazole (PRILOSEC OTC) 20 MG tablet Take 20 mg by mouth daily. 05/27/23: reports during [facility] call taking after recent surgery at [facility]    Corwin Levins, MD  ondansetron (ZOFRAN) 4 MG tablet Take 1 tablet (4 mg total) by mouth every 8 (eight) hours as needed for nausea or vomiting. Patient not taking: Reported on 06/02/2023 04/30/23   Myrlene Broker, MD  oxyCODONE (OXY IR/ROXICODONE) 5 MG immediate release tablet Take 5 mg by mouth every 4 (four) hours as needed for severe pain. 05/27/23: reports during [facility] call taking after recent surgery at [facility] Patient not taking: Reported on 06/02/2023    Corwin Levins, MD  prochlorperazine (COMPAZINE) 10 MG tablet Take 1 tablet (10 mg total) by mouth every 6 (six) hours as needed for nausea or vomiting. 06/11/23   Ladene Artist, MD  tamsulosin (FLOMAX) 0.4 MG CAPS capsule Take 0.4 mg by mouth daily. 05/27/23: reports during [facility] call taking after recent surgery at [facility]    Corwin Levins, MD     Family History  Problem Relation Age of Onset   COPD Mother    Cancer Father    Lung cancer Father    Colon cancer Neg Hx    Colon polyps Neg Hx    Rectal cancer Neg Hx    Stomach cancer Neg Hx     Social History   Socioeconomic History   Marital status: Married    Spouse name: Not on file   Number of children: 1   Years of education: Not on file   Highest education level: Not on file  Occupational History    Not on file  Tobacco Use   Smoking status: Never   Smokeless tobacco: Never  Vaping Use   Vaping status: Never Used  Substance and Sexual Activity   Alcohol use: Yes    Comment: occaional   Drug use: Never   Sexual activity: Yes  Other Topics Concern   Not on file  Social History Narrative   Not on file   Social Determinants of Health   Financial Resource Strain: Low Risk  (08/11/2022)   Overall Financial Resource Strain (CARDIA)    Difficulty of Paying Living Expenses: Not hard at all  Food Insecurity: No Food Insecurity (06/02/2023)   Hunger Vital Sign    Worried About Running Out of Food in the Last Year: Never true    Ran Out of Food in the Last Year: Never true  Transportation Needs: No Transportation Needs (06/02/2023)   PRAPARE - Administrator, Civil Service (Medical): No    Lack of Transportation (Non-Medical): No  Physical Activity: Inactive (08/11/2022)   Exercise Vital Sign  Days of Exercise per Week: 0 days    Minutes of Exercise per Session: 0 min  Stress: No Stress Concern Present (08/11/2022)   [employer] of Occupational Health - Occupational Stress Questionnaire    Feeling of Stress : Not at all  Social Connections: Socially Integrated (08/11/2022)   Social Connection and Isolation Panel [NHANES]    Frequency of Communication with Friends and Family: More than three times a week    Frequency of Social Gatherings with Friends and Family: More than three times a week    Attends Religious Services: More than 4 times per year    Active Member of [employer] or Organizations: Yes    Attends Engineer, structural: More than 4 times per year    Marital Status: Married      Review of Systems denies fever,HA,CP,dyspnea, cough, back pain, N/V or bleeding; does have abd discomfort; he is anxious  Vital Signs: Vitals:   06/18/23 1242  Pulse: 89  Resp: 15  Temp: 98.9 F (37.2 C)  SpO2: 95%      Code Status: FULL CODE  Advance  Care Plan: No documents on file   Physical Exam: awake/alert; chest- CTA bilat; heart- RRR; abd-soft,+BS,some mild generalized tenderness to palpation; incision site mid abd clean and dry; no LE edema  Imaging: CT CHEST WO CONTRAST  Result Date: 06/11/2023 CLINICAL DATA:  Ampullary cancer staging; recent Whipple procedure; * Tracking Code: BO * EXAM: CT CHEST WITHOUT CONTRAST TECHNIQUE: Multidetector CT imaging of the chest was performed following the standard protocol without IV contrast. RADIATION DOSE REDUCTION: This exam was performed according to the departmental dose-optimization program which includes automated exposure control, adjustment of the mA and/or kV according to patient size and/or use of iterative reconstruction technique. COMPARISON:  None Available. FINDINGS: Cardiovascular: Normal heart size. No pericardial effusion. Normal caliber thoracic aorta with mild calcified plaque. Mild coronary artery calcifications. Mediastinum/Nodes: Esophagus and thyroid are unremarkable. No enlarged lymph nodes seen in the chest. Lungs/Pleura: Central airways are patent. Bilateral lower lobe atelectasis. Calcified right lower lobe nodule, likely sequela prior granulomatous infection. No suspicious pulmonary nodules. Upper Abdomen: Postsurgical changes of recent Whipple including soft tissue stranding centered about the pancreatic head and porta hepatis and few small locules of extraluminal gas. Musculoskeletal: No chest wall mass or suspicious bone lesions identified. IMPRESSION: 1. No evidence of metastatic disease in the chest. 2. Postsurgical changes of recent Whipple procedure. 3. 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 Electronically Signed   By: Allegra Lai M.D.   On: 06/11/2023 11:33    Labs:  CBC: Recent Labs    05/11/23 0457 05/12/23 0446 05/13/23 0445 05/14/23 0422  WBC 10.4 9.9 7.5 8.3  HGB 15.6 15.8 15.3 15.6  HCT 45.5 45.3 45.0 44.6  PLT  197 179 181 191    COAGS: No results for input(s): "INR", "APTT" in the last 8760 hours.  BMP: Recent Labs    05/11/23 0457 05/12/23 0446 05/13/23 0445 05/14/23 0422  NA 136 139 138 136  K 4.1 4.4 3.7 3.6  CL 103 103 103 101  CO2 22 26 26 23   GLUCOSE 114* 113* 103* 100*  BUN 11 12 10 8   CALCIUM 8.7* 9.0 8.8* 8.7*  CREATININE 0.94 1.00 1.02 1.04  GFRNONAA >60 >60 >60 >60    LIVER FUNCTION TESTS: Recent Labs    06/24/22 1103 04/30/23 1537 05/08/23 1245 05/14/23 0422  BILITOT 0.9 0.8 1.1 1.1  AST 25 26 21  26  ALT 31 33 29 36  ALKPHOS 50 49 47 39  PROT 7.2 7.3 7.0 6.3*  ALBUMIN 4.4 4.5 3.9 3.5    TUMOR MARKERS: No results for input(s): "AFPTM", "CEA", "CA199", "CHROMGRNA" in the last 8760 hours.  Assessment and Plan: 82 y.o. male with past medical history significant for arthritis, BPH, diverticulosis, skin cancer, hyperlipidemia, impaired glucose tolerance, colon polyps, H. 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  He is scheduled today for Port-A-Cath placement to assist with treatment.Risks and benefits of image guided port-a-catheter placement was discussed with the patient including, but not limited to bleeding, infection, pneumothorax, or fibrin sheath development and need for additional procedures.  All of the patient's questions were answered, patient is agreeable to proceed. Consent signed and in chart.    Thank you for this interesting consult.  I greatly enjoyed meeting Charles Lawson and look forward to participating in their care.  A copy of this report was sent to the requesting provider on this date.  Electronically Signed: D. Jeananne Rama, PA-C 06/17/2023, 10:21 AM   I spent a total of  25 minutes   in face to face in clinical consultation, greater than 50% of which was counseling/coordinating care for Port-A-Cath placement

## 2023-06-17 NOTE — Progress Notes (Signed)
Patient education documented in EPIC note on 06/17/23.

## 2023-06-17 NOTE — Telephone Encounter (Signed)
[employer] (Specialty) to deliver medication to patient on 06/22/23. He know the plan is to start on 06/23/23.

## 2023-06-17 NOTE — Telephone Encounter (Signed)
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 Planned start 10/2 on day of first gemcitabine infusion appointment.   Counseled patient on administration, dosing, side effects, monitoring, drug-food interactions, safe handling, storage, and disposal. Patient will take Xeloda (capecitabine) 1500 mg BID with meals Days 1-21 then hold for 7 days.  Side effects include but not limited to: Diarrhea, Mouth sores, Hand-foot syndrome, Nausea/Vomiting, Fatigue, low counts.   Diarrhea: Advised the patient to take loperamide if they start to experience diarrhea. Informed them to contact their provider if they start to experience more than 4 loss stools above baseline. Mouth sores: Advised the patient to call their provider if they start to develop mouth sores. They can ask for a Magic Mouth Wash prescription to help with numbing and healing of the sores. Hand-foot syndrome: Advised the patient to monitor for peeling skin on their palms and foot. Recommend they use lotion with 10-20% urea to reduce the risk of developing this syndrome.  Nausea/Vomiting: Advised the patient to take Compazine if they start to experience any nausea or vomiting while taking this medication. If they experience this side effect recommend they stay hydrate and call their provider if the compazine doesn't relieve their symptoms. Informed them to not take the ondansetron they have at home as there is an interaction with this medication. Fatigue: Advised the patient this medication may make them feel more tired than usual. Recommend they stay active but rest when needed. Contact their provider if the fatigue starts to impact their quality of life.  Low Counts: Informed the patient this medication may  reduce their blood cell counts so to monitor for signs of infection (fever) and to contact their provider. Advised them that their counts will be monitored by their provider during their check-in appointments.   Reviewed with patient importance of keeping a medication schedule and plan for any missed doses.  After discussion with patient no patient barriers to medication adherence identified.   Christell Constant and Veryl Speak voiced understanding and appreciation. All questions answered. Medication handout was mailed. [address], [place], [place] [address]  Provided patient with Oral Chemotherapy Navigation Clinic phone number. Patient knows to call the office with questions or concerns. Oral Chemotherapy Navigation Clinic will continue to follow.  Gara Kroner, PharmD PGY1 Pharmacy Resident - [facility] [phone]  06/17/2023 4:27 PM

## 2023-06-17 NOTE — Telephone Encounter (Signed)
Patient successfully [employer] and drug education provided by pharmacist. Medication scheduled to be shipped on Monday, 06/21/23 for delivery on Tuesday, 06/22/23 from [facility] to patient's address. Patient also knows to call me at [phone] with any questions or concerns regarding receiving medication or if there is any unexpected change in co-pay.    Ardeen Fillers, CPhT Oncology Pharmacy Patient Advocate  [facility]  [phone] (phone) [phone] (fax) 06/17/2023 3:36 PM

## 2023-06-18 ENCOUNTER — Ambulatory Visit (HOSPITAL_COMMUNITY)
Admission: RE | Admit: 2023-06-18 | Discharge: 2023-06-18 | Disposition: A | Discharge status: Home / Self Care | Payer: Medicare Other | Source: Ambulatory Visit | Attending: Oncology | Admitting: Oncology

## 2023-06-18 ENCOUNTER — Ambulatory Visit (HOSPITAL_COMMUNITY)
Admission: RE | Admit: 2023-06-18 | Discharge: 2023-06-18 | Disposition: A | Discharge status: Home / Self Care | Payer: Medicare Other | Source: Ambulatory Visit | Attending: Oncology

## 2023-06-18 ENCOUNTER — Other Ambulatory Visit (HOSPITAL_COMMUNITY): Payer: Self-pay

## 2023-06-18 DIAGNOSIS — C241 Malignant neoplasm of ampulla of Vater: Secondary | ICD-10-CM | POA: Insufficient documentation

## 2023-06-18 DIAGNOSIS — E785 Hyperlipidemia, unspecified: Secondary | ICD-10-CM | POA: Insufficient documentation

## 2023-06-18 DIAGNOSIS — Z85828 Personal history of other malignant neoplasm of skin: Secondary | ICD-10-CM | POA: Diagnosis not present

## 2023-06-18 DIAGNOSIS — N400 Enlarged prostate without lower urinary tract symptoms: Secondary | ICD-10-CM | POA: Diagnosis not present

## 2023-06-18 HISTORY — PX: IR IMAGING GUIDED PORT INSERTION: IMG5740

## 2023-06-18 MED ORDER — LIDOCAINE-EPINEPHRINE 1 %-1:100000 IJ SOLN
20.0000 mL | Freq: Once | INTRAMUSCULAR | Status: AC
  Administered 2023-06-18: 20 mL via INTRADERMAL

## 2023-06-18 MED ORDER — SODIUM CHLORIDE 0.9 % IV SOLN: INTRAVENOUS | Status: DC

## 2023-06-18 MED ORDER — FENTANYL CITRATE (PF) 100 MCG/2ML IJ SOLN
INTRAMUSCULAR | Status: AC | PRN
Start: 2023-06-18 — End: 2023-06-18
  Administered 2023-06-18: 50 ug via INTRAVENOUS

## 2023-06-18 MED ORDER — MIDAZOLAM HCL 2 MG/2ML IJ SOLN
INTRAMUSCULAR | Status: AC | PRN
  Administered 2023-06-18: 1 mg via INTRAVENOUS

## 2023-06-18 MED ORDER — MIDAZOLAM HCL 2 MG/2ML IJ SOLN
INTRAMUSCULAR | Status: AC
  Filled 2023-06-18: qty 2

## 2023-06-18 MED ORDER — MIDAZOLAM HCL 2 MG/2ML IJ SOLN
INTRAMUSCULAR | Status: AC | PRN
Start: 2023-06-18 — End: 2023-06-18
  Administered 2023-06-18: 1 mg via INTRAVENOUS

## 2023-06-18 MED ORDER — FENTANYL CITRATE (PF) 100 MCG/2ML IJ SOLN
INTRAMUSCULAR | Status: AC
  Filled 2023-06-18: qty 2

## 2023-06-18 MED ORDER — HEPARIN SOD (PORK) LOCK FLUSH 100 UNIT/ML IV SOLN
INTRAVENOUS | Status: AC
  Filled 2023-06-18: qty 5

## 2023-06-18 MED ORDER — LIDOCAINE-EPINEPHRINE 1 %-1:100000 IJ SOLN
INTRAMUSCULAR | Status: AC
  Filled 2023-06-18: qty 1

## 2023-06-18 MED ORDER — HEPARIN SOD (PORK) LOCK FLUSH 100 UNIT/ML IV SOLN
500.0000 [IU] | Freq: Once | INTRAVENOUS | Status: AC
  Administered 2023-06-18: 500 [IU] via INTRAVENOUS

## 2023-06-18 NOTE — Discharge Instructions (Signed)

## 2023-06-18 NOTE — Procedures (Signed)
Interventional Radiology Procedure Note  Procedure: Port placement.  Indication: Ampullary Carcinoma  Findings: Please refer to procedural dictation for full description.  Complications: None  EBL: < 10 mL  Acquanetta Belling, MD [phone]

## 2023-06-19 ENCOUNTER — Other Ambulatory Visit: Payer: Self-pay | Admitting: Oncology

## 2023-06-21 ENCOUNTER — Other Ambulatory Visit (HOSPITAL_COMMUNITY): Payer: Self-pay

## 2023-06-21 ENCOUNTER — Other Ambulatory Visit: Payer: Self-pay

## 2023-06-23 ENCOUNTER — Other Ambulatory Visit: Payer: Self-pay

## 2023-06-23 ENCOUNTER — Encounter: Payer: Self-pay | Admitting: *Deleted

## 2023-06-23 ENCOUNTER — Inpatient Hospital Stay: Payer: Medicare Other | Attending: Oncology

## 2023-06-23 ENCOUNTER — Inpatient Hospital Stay: Payer: Medicare Other | Admitting: Nutrition

## 2023-06-23 ENCOUNTER — Encounter: Payer: Self-pay | Admitting: Nurse Practitioner

## 2023-06-23 ENCOUNTER — Inpatient Hospital Stay: Payer: Medicare Other

## 2023-06-23 ENCOUNTER — Inpatient Hospital Stay (HOSPITAL_BASED_OUTPATIENT_CLINIC_OR_DEPARTMENT_OTHER): Payer: Medicare Other | Admitting: Nurse Practitioner

## 2023-06-23 VITALS — BP 122/69 | HR 79

## 2023-06-23 VITALS — BP 122/88 | HR 97 | Temp 97.8°F | Resp 18 | Ht 71.0 in | Wt 174.6 lb

## 2023-06-23 DIAGNOSIS — C241 Malignant neoplasm of ampulla of Vater: Secondary | ICD-10-CM | POA: Diagnosis not present

## 2023-06-23 DIAGNOSIS — Z5111 Encounter for antineoplastic chemotherapy: Secondary | ICD-10-CM | POA: Insufficient documentation

## 2023-06-23 LAB — CMP (CANCER CENTER ONLY)
ALT: 19 U/L (ref 0–44)
AST: 18 U/L (ref 15–41)
Albumin: 3.3 g/dL — ABNORMAL LOW (ref 3.5–5.0)
Alkaline Phosphatase: 43 U/L (ref 38–126)
Anion gap: 8 (ref 5–15)
BUN: 13 mg/dL (ref 8–23)
CO2: 26 mmol/L (ref 22–32)
Calcium: 9 mg/dL (ref 8.9–10.3)
Chloride: 101 mmol/L (ref 98–111)
Creatinine: 0.86 mg/dL (ref 0.61–1.24)
GFR, Estimated: >60 mL/min (ref >60)
Glucose, Bld: 115 mg/dL — ABNORMAL HIGH (ref 70–99)
Potassium: 4 mmol/L (ref 3.5–5.1)
Sodium: 135 mmol/L (ref 135–145)
Total Bilirubin: 0.5 mg/dL (ref 0.3–1.2)
Total Protein: 7 g/dL (ref 6.5–8.1)

## 2023-06-23 LAB — CBC WITH DIFFERENTIAL (CANCER CENTER ONLY)
Abs Immature Granulocytes: 0.05 10*3/uL (ref 0.00–0.07)
Basophils Absolute: 0 10*3/uL (ref 0.0–0.1)
Basophils Relative: 0 %
Eosinophils Absolute: 0.2 10*3/uL (ref 0.0–0.5)
Eosinophils Relative: 1 %
HCT: 41.2 % (ref 39.0–52.0)
Hemoglobin: 13.5 g/dL (ref 13.0–17.0)
Immature Granulocytes: 0 %
Lymphocytes Relative: 21 %
Lymphs Abs: 2.6 10*3/uL (ref 0.7–4.0)
MCH: 28.2 pg (ref 26.0–34.0)
MCHC: 32.8 g/dL (ref 30.0–36.0)
MCV: 86.2 fL (ref 80.0–100.0)
Monocytes Absolute: 0.8 10*3/uL (ref 0.1–1.0)
Monocytes Relative: 7 %
Neutro Abs: 8.9 10*3/uL — ABNORMAL HIGH (ref 1.7–7.7)
Neutrophils Relative %: 71 %
Platelet Count: 310 10*3/uL (ref 150–400)
RBC: 4.78 MIL/uL (ref 4.22–5.81)
RDW: 13.5 % (ref 11.5–15.5)
WBC Count: 12.6 10*3/uL — ABNORMAL HIGH (ref 4.0–10.5)
nRBC: 0 % (ref 0.0–0.2)

## 2023-06-23 MED ORDER — SODIUM CHLORIDE 0.9% FLUSH
10.0000 mL | INTRAVENOUS | Status: DC | PRN
  Administered 2023-06-23: 10 mL

## 2023-06-23 MED ORDER — HEPARIN SOD (PORK) LOCK FLUSH 100 UNIT/ML IV SOLN
500.0000 [IU] | Freq: Once | INTRAVENOUS | Status: AC | PRN
  Administered 2023-06-23: 500 [IU]

## 2023-06-23 MED ORDER — SODIUM CHLORIDE 0.9 % IV SOLN: Freq: Once | INTRAVENOUS | Status: AC

## 2023-06-23 MED ORDER — PROCHLORPERAZINE MALEATE 10 MG PO TABS
10.0000 mg | ORAL_TABLET | Freq: Once | ORAL | Status: AC
  Administered 2023-06-23: 10 mg via ORAL
  Filled 2023-06-23: qty 1

## 2023-06-23 MED ORDER — SODIUM CHLORIDE 0.9 % IV SOLN
2000.0000 mg | Freq: Once | INTRAVENOUS | Status: AC
  Administered 2023-06-23: 2000 mg via INTRAVENOUS
  Filled 2023-06-23: qty 52.6

## 2023-06-23 NOTE — Progress Notes (Signed)
Received faxed notification from [facility] that order for testing is received. Working on obtaining tissue.

## 2023-06-23 NOTE — Progress Notes (Signed)
  [facility] OFFICE PROGRESS NOTE   Diagnosis: Ampullary carcinoma  INTERVAL HISTORY:   Mr. Honse returns as scheduled.  He has noticed a right foot drop since last office visit.  His wife indicates he has had significant weight loss over the past many weeks.  No falls.  No back pain.  No numbness or tingling in the legs.  No bowel or bladder dysfunction.  He denies nausea/vomiting.  Stools are loose but not watery.  He typically has 2 bowel movements each morning.  He is eating but notes the quantity is less than typical.  Objective:  Vital signs in last 24 hours:  Blood pressure 122/88, pulse 97, temperature 97.8 F (36.6 C), temperature source Oral, resp. rate 18, height 5\' 11"  (1.803 m), weight 174 lb 9.6 oz (79.2 kg), SpO2 97%.    HEENT: No thrush or ulcers. Resp: Lungs clear bilaterally. Cardio: Regular rate and rhythm. GI: Tender at the right abdomen.  No hepatomegaly.  Healed midline surgical incision. Vascular: No leg edema. Neuro: Right foot drop.  Leg strength otherwise intact. Port-A-Cath without erythema.  Lab Results:  Lab Results  Component Value Date   WBC 12.6 (H) 06/23/2023   HGB 13.5 06/23/2023   HCT 41.2 06/23/2023   MCV 86.2 06/23/2023   PLT 310 06/23/2023   NEUTROABS 8.9 (H) 06/23/2023    Imaging:  No results found.  Medications: I have reviewed the patient's current medications.  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 pylori gastritis 05/10/2023-we will confirm treatment plan with Dr. Leone Payor  Disposition: Mr. Lundstrom appears stable.  He is scheduled  to begin treatment today with gemcitabine/capecitabine.  We again reviewed potential toxicities.  He agrees to proceed.  CBC and chemistry panel reviewed.  Labs adequate to proceed as above.  He has developed a right foot drop.  The foot drop may be related to compression of the peroneal nerve in the setting of weight loss.  We will refer him for an AFO brace/physical therapy.  He will return for follow-up and treatment in 2 weeks.  We are available to see him sooner if needed.  Patient seen with Dr. Truett Perna.    Lonna Cobb ANP/GNP-BC   06/23/2023  1:32 PM This was a shared visit with Lonna Cobb.  Mr.Teschner will begin adjuvant gemcitabine/capecitabine today.  His case was presented at the GI tumor conference earlier this week.  I suspect the foot drop is related to weight loss and peripheral nerve compression.  I was present for greater than 50% of today's visit.  I performed medical decision making.  Mancel Bale, MD

## 2023-06-23 NOTE — Progress Notes (Signed)
82 year old male diagnosed with Ampullary cancer s/p duodenal obstruction; +H pylori. Patient followed by Dr. Truett Perna. Plan for Gemcitabine and Capecitabine. S/p Whipple on August 26 at [facility].  PMH includes HTN, Diverticulosis, Basal cell cancer, HLD, impaired glucose tolerance.  Medications include Creon, 12,000-38,000 units 3 times daily, Prilosec, Zofran, Pepto bismol, Flagyl  Labs include Glucose 125.  Height: 5'11". Weight: 174 pounds 9.6 oz. UBW: 200 pounds. Patient weighed 195 pounds 11oz on Aug 21. BMI: 24.28.  Patient tolerates a regular diet and is eating his usual foods. He does not really have an appetite but knows he needs to eat. Denies nausea and vomiting. Tolerates eggs and bacon and toast for breakfast with fruit. Ate tuna sandwich before chemotherapy today. He likes Boost products better than Ensure. Takes 12,000 units Creon with meals. Reports 2 soft stools/no diarrhea.  Nutrition Diagnosis: Unintended wt loss related to cancer and associated treatments as evidenced by 11% wt loss in less than 3 months which is clinically significant.  Intervention: Educated to eat small frequent meals and snacks. Increase calories and protein to promote wt stabilization. Take Creon with first bite of food at meals and snacks. Provided samples and coupons. Gave fact sheets and contact information.  Monitoring, Evaluation, Goals: Tolerate increase calories and protein to promote wt stabilization.  Next Visit:Wednesday, Oct 30, during infusion.

## 2023-06-23 NOTE — Patient Instructions (Signed)

## 2023-06-23 NOTE — Progress Notes (Signed)
The proposed treatment discussed in conference is for discussion purpose only and is not a binding recommendation.  The patients have not been physically examined, or presented with their treatment options.  Therefore, final treatment plans cannot be decided.  

## 2023-06-23 NOTE — Progress Notes (Signed)
PATIENT NAVIGATOR PROGRESS NOTE  Name: Charles Lawson Date: 06/23/2023 MRN: [id number]  DOB: Feb 13, 1941   Reason for visit: Foundation One testing requested  Comments:  Foundation one testing requested on accession number [id number]    Time spent counseling/coordinating care: 30-45 minutes

## 2023-06-23 NOTE — Progress Notes (Signed)
Patient seen by Lonna Cobb NP today  Vitals are within treatment parameters:Yes   Labs are within treatment parameters: Yes   Treatment plan has been signed: Yes   Per physician team, Patient is ready for treatment and there are NO modifications to the treatment plan.

## 2023-06-23 NOTE — Patient Instructions (Addendum)
[place] [facility]  Discharge Instructions: Thank you for choosing [facility] to provide your oncology and hematology care.   If you have a lab appointment with the Cancer Center, please go directly to the Cancer Center and check in at the registration area.   Wear comfortable clothing and clothing appropriate for easy access to any Portacath or PICC line.   We strive to give you quality time with your provider. You may need to reschedule your appointment if you arrive late (15 or more minutes).  Arriving late affects you and other patients whose appointments are after yours.  Also, if you miss three or more appointments without notifying the office, you may be dismissed from the clinic at the provider's discretion.      For prescription refill requests, have your pharmacy contact our office and allow 72 hours for refills to be completed.    Today you received the following chemotherapy and/or immunotherapy agents: gemcitabine      To help prevent nausea and vomiting after your treatment, we encourage you to take your nausea medication as directed.  BELOW ARE SYMPTOMS THAT SHOULD BE REPORTED IMMEDIATELY: *FEVER GREATER THAN 100.4 F (38 C) OR HIGHER *CHILLS OR SWEATING *NAUSEA AND VOMITING THAT IS NOT CONTROLLED WITH YOUR NAUSEA MEDICATION *UNUSUAL SHORTNESS OF BREATH *UNUSUAL BRUISING OR BLEEDING *URINARY PROBLEMS (pain or burning when urinating, or frequent urination) *BOWEL PROBLEMS (unusual diarrhea, constipation, pain near the anus) TENDERNESS IN MOUTH AND THROAT WITH OR WITHOUT PRESENCE OF ULCERS (sore throat, sores in mouth, or a toothache) UNUSUAL RASH, SWELLING OR PAIN  UNUSUAL VAGINAL DISCHARGE OR ITCHING   Items with * indicate a potential emergency and should be followed up as soon as possible or go to the Emergency Department if any problems should occur.  Please show the CHEMOTHERAPY ALERT CARD or IMMUNOTHERAPY ALERT CARD at  check-in to the Emergency Department and triage nurse.  Should you have questions after your visit or need to cancel or reschedule your appointment, please contact [place] [facility]  Dept: [phone]  and follow the prompts.  Office hours are 8:00 a.m. to 4:30 p.m. Monday - Friday. Please note that voicemails left after 4:00 p.m. may not be returned until the following business day.  We are closed weekends and major holidays. You have access to a nurse at all times for urgent questions. Please call the main number to the clinic Dept: [phone] and follow the prompts.   For any non-urgent questions, you may also contact your provider using MyChart. We now offer e-Visits for anyone 39 and older to request care online for non-urgent symptoms. For details visit [email or web address].   Also download the MyChart app! Go to the app store, search "MyChart", open the app, select [place], and log in with your MyChart username and password.  Gemcitabine Injection What is this medication? GEMCITABINE (jem SYE ta been) treats some types of cancer. It works by slowing down the growth of cancer cells. This medicine may be used for other purposes; ask your health care provider or pharmacist if you have questions. COMMON BRAND NAME(S): Gemzar, Infugem What should I tell my care team before I take this medication? They need to know if you have any of these conditions: Blood disorders Infection Kidney disease Liver disease Lung or breathing disease, such as asthma or COPD Recent or ongoing radiation therapy An unusual or allergic reaction to gemcitabine, other medications, foods, dyes, or preservatives If  you or your partner are pregnant or trying to get pregnant Breast-feeding How should I use this medication? This medication is injected into a vein. It is given by your care team in a hospital or clinic setting. Talk to your care team about the use of this medication  in children. Special care may be needed. Overdosage: If you think you have taken too much of this medicine contact a poison control center or emergency room at once. NOTE: This medicine is only for you. Do not share this medicine with others. What if I miss a dose? Keep appointments for follow-up doses. It is important not to miss your dose. Call your care team if you are unable to keep an appointment. What may interact with this medication? Interactions have not been studied. This list may not describe all possible interactions. Give your health care provider a list of all the medicines, herbs, non-prescription drugs, or dietary supplements you use. Also tell them if you smoke, drink alcohol, or use illegal drugs. Some items may interact with your medicine. What should I watch for while using this medication? Your condition will be monitored carefully while you are receiving this medication. This medication may make you feel generally unwell. This is not uncommon, as chemotherapy can affect healthy cells as well as cancer cells. Report any side effects. Continue your course of treatment even though you feel ill unless your care team tells you to stop. In some cases, you may be given additional medications to help with side effects. Follow all directions for their use. This medication may increase your risk of getting an infection. Call your care team for advice if you get a fever, chills, sore throat, or other symptoms of a cold or flu. Do not treat yourself. Try to avoid being around people who are sick. This medication may increase your risk to bruise or bleed. Call your care team if you notice any unusual bleeding. Be careful brushing or flossing your teeth or using a toothpick because you may get an infection or bleed more easily. If you have any dental work done, tell your dentist you are receiving this medication. Avoid taking medications that contain aspirin, acetaminophen, ibuprofen,  naproxen, or ketoprofen unless instructed by your care team. These medications may hide a fever. Talk to your care team if you or your partner wish to become pregnant or think you might be pregnant. This medication can cause serious birth defects if taken during pregnancy and for 6 months after the last dose. A negative pregnancy test is required before starting this medication. A reliable form of contraception is recommended while taking this medication and for 6 months after the last dose. Talk to your care team about effective forms of contraception. Do not father a child while taking this medication and for 3 months after the last dose. Use a condom while having sex during this time period. Do not breastfeed while taking this medication and for at least 1 week after the last dose. This medication may cause infertility. Talk to your care team if you are concerned about your fertility. What side effects may I notice from receiving this medication? Side effects that you should report to your care team as soon as possible: Allergic reactions--skin rash, itching, hives, swelling of the face, lips, tongue, or throat Capillary leak syndrome--stomach or muscle pain, unusual weakness or fatigue, feeling faint or lightheaded, decrease in the amount of urine, swelling of the ankles, hands, or feet, trouble breathing Infection--fever, chills, cough,  sore throat, wounds that don't heal, pain or trouble when passing urine, general feeling of discomfort or being unwell Liver injury--right upper belly pain, loss of appetite, nausea, light-colored stool, dark yellow or brown urine, yellowing skin or eyes, unusual weakness or fatigue Low red blood cell level--unusual weakness or fatigue, dizziness, headache, trouble breathing Lung injury--shortness of breath or trouble breathing, cough, spitting up blood, chest pain, fever Stomach pain, bloody diarrhea, pale skin, unusual weakness or fatigue, decrease in the amount of  urine, which may be signs of hemolytic uremic syndrome Sudden and severe headache, confusion, change in vision, seizures, which may be signs of posterior reversible encephalopathy syndrome (PRES) Unusual bruising or bleeding Side effects that usually do not require medical attention (report to your care team if they continue or are bothersome): Diarrhea Drowsiness Hair loss Nausea Pain, redness, or swelling with sores inside the mouth or throat Vomiting This list may not describe all possible side effects. Call your doctor for medical advice about side effects. You may report side effects to FDA at 1-800-FDA-1088. Where should I keep my medication? This medication is given in a hospital or clinic. It will not be stored at home. NOTE: This sheet is a summary. It may not cover all possible information. If you have questions about this medicine, talk to your doctor, pharmacist, or health care provider.  2024 Elsevier/Gold Standard (2022-01-13 00:00:00)

## 2023-06-24 ENCOUNTER — Other Ambulatory Visit: Payer: Self-pay

## 2023-06-24 ENCOUNTER — Telehealth: Payer: Self-pay

## 2023-06-24 LAB — CANCER ANTIGEN 19-9: CA 19-9: 12 U/mL (ref 0–35)

## 2023-06-24 NOTE — Telephone Encounter (Signed)
Called patient's wife Charles Lawson) for first time chemo follow-up.  Patient had requested for office to call wife's number when he was in office on 06/23/23.  Patient's wife stated patient has been doing well, no reports of nausea, but did report feeling tired.  Informed wife that this is a normal side effect. Instructed patient's wife to have patient continue to hydrate and to eat small meals. Patient's wife verbalized understanding and agreed to relay information to patient.  Instructions given to contact office with any future questions or concerns.  Wife verbalized understanding.  All questions were answered during phone call.

## 2023-06-25 ENCOUNTER — Encounter: Payer: Self-pay | Admitting: Oncology

## 2023-06-29 ENCOUNTER — Ambulatory Visit (INDEPENDENT_AMBULATORY_CARE_PROVIDER_SITE_OTHER): Payer: Medicare Other | Admitting: Internal Medicine

## 2023-06-29 ENCOUNTER — Encounter: Payer: Self-pay | Admitting: Internal Medicine

## 2023-06-29 VITALS — BP 122/70 | HR 85 | Temp 98.1°F | Ht 71.0 in | Wt 174.0 lb

## 2023-06-29 DIAGNOSIS — I2510 Atherosclerotic heart disease of native coronary artery without angina pectoris: Secondary | ICD-10-CM

## 2023-06-29 DIAGNOSIS — E559 Vitamin D deficiency, unspecified: Secondary | ICD-10-CM

## 2023-06-29 DIAGNOSIS — E7800 Pure hypercholesterolemia, unspecified: Secondary | ICD-10-CM

## 2023-06-29 DIAGNOSIS — F32A Depression, unspecified: Secondary | ICD-10-CM | POA: Diagnosis not present

## 2023-06-29 DIAGNOSIS — R7302 Impaired glucose tolerance (oral): Secondary | ICD-10-CM

## 2023-06-29 DIAGNOSIS — E538 Deficiency of other specified B group vitamins: Secondary | ICD-10-CM

## 2023-06-29 DIAGNOSIS — R030 Elevated blood-pressure reading, without diagnosis of hypertension: Secondary | ICD-10-CM

## 2023-06-29 NOTE — Progress Notes (Unsigned)
Patient ID: Charles Lawson, male   DOB: [birth year], 82 y.o.   MRN: [id number]        Chief Complaint: follow up  hld, hyperglycemia, elevated BP, low vit d       HPI:  Charles Lawson is a 82 y.o. male here overall doing ok,  Pt denies chest pain, increased sob or doe, wheezing, orthopnea, PND, increased LE swelling, palpitations, dizziness or syncope.   Pt denies polydipsia, polyuria, or new focal neuro s/s.    Pt denies fever, wt loss, night sweats, loss of appetite, or other constitutional symptoms   Has new right drop foot, for brace soon, walking with cane, no falls.  Currently involved in 6 mo tx chemo them f/u CT.  Wt at the low was about 170, maybe some improved now.   Wt Readings from Last 3 Encounters:  06/29/23 174 lb (78.9 kg)  06/23/23 174 lb 9.6 oz (79.2 kg)  06/18/23 167 lb (75.8 kg)   BP Readings from Last 3 Encounters:  06/29/23 122/70  06/23/23 122/69  06/23/23 122/88         Past Medical History:  Diagnosis Date   Allergic rhinitis, cause unspecified    Arthritis    Blood pressure elevated without history of HTN    BPH (benign prostatic hyperplasia)    Diverticulosis of colon    ED (erectile dysfunction)    History of basal cell carcinoma (BCC) excision    Hyperlipidemia    Impaired glucose tolerance 04/03/2013   Personal history of colonic polyps 06/07/2012   Past Surgical History:  Procedure Laterality Date   Quintella Reichert OSTEOTOMY Left 07/19/2020   Procedure: Quintella Reichert OSTEOTOMY;  Surgeon: Edwin Cap, DPM;  Location: [facility] [place];  Service: Podiatry;  Laterality: Left;   BIOPSY  05/10/2023   Procedure: BIOPSY;  Surgeon: Iva Boop, MD;  Location: [facility];  Service: Gastroenterology;;   BIOPSY  05/12/2023   Procedure: BIOPSY;  Surgeon: Iva Boop, MD;  Location: [facility];  Service: Gastroenterology;;   Arbutus Leas Left 07/19/2020   Procedure: Delos Haring;  Surgeon: Edwin Cap, DPM;  Location[facility];   Service: Podiatry;  Laterality: Left;   COLONOSCOPY  last one 10-06-2017  dr Leone Payor   ESOPHAGOGASTRODUODENOSCOPY N/A 05/12/2023   Procedure: ESOPHAGOGASTRODUODENOSCOPY (EGD);  Surgeon: Iva Boop, MD;  Location: [facility];  Service: Gastroenterology;  Laterality: N/A;   ESOPHAGOGASTRODUODENOSCOPY (EGD) WITH PROPOFOL N/A 05/10/2023   Procedure: ESOPHAGOGASTRODUODENOSCOPY (EGD) WITH PROPOFOL;  Surgeon: Iva Boop, MD;  Location: [facility];  Service: Gastroenterology;  Laterality: N/A;   HAMMER TOE SURGERY Left 07/19/2020   Procedure: HAMMER TOE CORRECTION;  Surgeon: Edwin Cap, DPM;  Location: [facility] [place];  Service: Podiatry;  Laterality: Left;   INGUINAL HERNIA REPAIR Right 11-21-2008  @ [facility]   IR IMAGING GUIDED PORT INSERTION  06/18/2023   LUMBAR LAMINECTOMY  1974   METATARSAL OSTEOTOMY Left 07/19/2020   Procedure: METATARSAL OSTEOTOMY;  Surgeon: Edwin Cap, DPM;  Location: [facility];  Service: Podiatry;  Laterality: Left;   ORIF TOE FRACTURE Left 07/19/2020   Procedure: OPEN TREATMENT OF METARSAL PHALANGEAL JOINT DISLOCATION;  Surgeon: Edwin Cap, DPM;  Location: [facility];  Service: Podiatry;  Laterality: Left;  ANKLE BLOCK WITH THIGH TOURNIQUET    reports that he has never smoked. He has never used smokeless tobacco. He reports current alcohol use. He reports that he does not use drugs. family  history includes COPD in his mother; Cancer in his father; Lung cancer in his father. Allergies  Allergen Reactions   Penicillins Other (See Comments)    Unknown childhood reaction   Current Outpatient Medications on File Prior to Visit  Medication Sig Dispense Refill   capecitabine (XELODA) 500 MG tablet Take 3 tablets (1,500 mg total) by mouth 2 (two) times daily after a meal. Take for 21 days, then hold for 7 days. Repeat every 28 days. 126 tablet 0   lipase/protease/amylase (CREON) 12000-38000 units CPEP capsule  Take 12,000 Units by mouth 3 (three) times daily before meals. 05/27/23: reports during [facility] call taking after recent surgery at [facility]     omeprazole (PRILOSEC OTC) 20 MG tablet Take 20 mg by mouth daily. 05/27/23: reports during [facility] call taking after recent surgery at [facility]     tamsulosin (FLOMAX) 0.4 MG CAPS capsule Take 0.4 mg by mouth daily. 05/27/23: reports during [facility] call taking after recent surgery at [facility]     acetaminophen (TYLENOL) 650 MG CR tablet Take 500 mg by mouth every 8 (eight) hours as needed for pain. 05/27/23: reports during [facility] call taking after recent surgery at [facility] (Patient not taking: Reported on 06/02/2023)     enoxaparin (LOVENOX) 40 MG/0.4ML injection Inject 40 mg into the skin daily. 05/27/23: reports during [facility] call taking after recent surgery at [facility]- to take for 14 days post-op (Patient not taking: Reported on 06/23/2023)     lidocaine-prilocaine (EMLA) cream Apply 1 Application topically as needed. (Patient not taking: Reported on 06/23/2023) 30 g 0   ondansetron (ZOFRAN) 4 MG tablet Take 1 tablet (4 mg total) by mouth every 8 (eight) hours as needed for nausea or vomiting. (Patient not taking: Reported on 06/02/2023) 20 tablet 0   oxyCODONE (OXY IR/ROXICODONE) 5 MG immediate release tablet Take 5 mg by mouth every 4 (four) hours as needed for severe pain. 05/27/23: reports during [facility] call taking after recent surgery at [facility] (Patient not taking: Reported on 06/23/2023)     prochlorperazine (COMPAZINE) 10 MG tablet Take 1 tablet (10 mg total) by mouth every 6 (six) hours as needed for nausea or vomiting. (Patient not taking: Reported on 06/23/2023) 30 tablet 0   No current facility-administered medications on file prior to visit.        ROS:  All others reviewed and negative.  Objective        PE:  BP 122/70 (BP Location: Right Arm, Patient Position: Sitting, Cuff Size: Normal)   Pulse 85   Temp 98.1 F (36.7 C) (Oral)   Ht 5\' 11"  (1.803 m)   Wt 174 lb (78.9 kg)   SpO2  98%   BMI 24.27 kg/m                 Constitutional: Pt appears in NAD               HENT: Head: NCAT.                Right Ear: External ear normal.                 Left Ear: External ear normal.                Eyes: . Pupils are equal, round, and reactive to light. Conjunctivae and EOM are normal               Nose: without d/c or deformity  Neck: Neck supple. Gross normal ROM               Cardiovascular: Normal rate and regular rhythm.                 Pulmonary/Chest: Effort normal and breath sounds without rales or wheezing.                Abd:  Soft, NT, ND, + BS, no organomegaly               Neurological: Pt is alert. At baseline orientation, motor grossly intact               Skin: Skin is warm. No rashes, no other new lesions, LE edema - none               Psychiatric: Pt behavior is normal without agitation ,  depressed afftect  Micro: none  Cardiac tracings I have personally interpreted today:  none  Pertinent Radiological findings (summarize): none   Lab Results  Component Value Date   WBC 12.6 (H) 06/23/2023   HGB 13.5 06/23/2023   HCT 41.2 06/23/2023   PLT 310 06/23/2023   GLUCOSE 115 (H) 06/23/2023   CHOL 144 06/24/2022   TRIG 80.0 06/24/2022   HDL 43.60 06/24/2022   LDLDIRECT 88.0 04/02/2020   LDLCALC 84 06/24/2022   ALT 19 06/23/2023   AST 18 06/23/2023   NA 135 06/23/2023   K 4.0 06/23/2023   CL 101 06/23/2023   CREATININE 0.86 06/23/2023   BUN 13 06/23/2023   CO2 26 06/23/2023   TSH 1.44 06/24/2022   PSA 4.99 (H) 06/23/2021   HGBA1C 5.1 05/14/2023   Assessment/Plan:  Charles Lawson is a 81 y.o. White or Caucasian [1] male with  has a past medical history of Allergic rhinitis, cause unspecified, Arthritis, Blood pressure elevated without history of HTN, BPH (benign prostatic hyperplasia), Diverticulosis of colon, ED (erectile dysfunction), History of basal cell carcinoma (BCC) excision, Hyperlipidemia, Impaired glucose tolerance  (04/03/2013), and Personal history of colonic polyps (06/07/2012).  Depression Has significant depressed affect today likely situational related to recent tx, but declines need for specific tx or counseling at this time  Vitamin D deficiency Last vitamin D Lab Results  Component Value Date   VD25OH 34.08 06/24/2022   Low, to start oral replacement   Blood pressure elevated without history of HTN BP Readings from Last 3 Encounters:  06/29/23 122/70  06/23/23 122/69  06/23/23 122/88   Stable, pt to continue medical treatment  - diet, wt control   Impaired glucose tolerance Lab Results  Component Value Date   HGBA1C 5.1 05/14/2023   Stable, pt to continue current medical treatment  - diet, wt control   Hyperlipidemia Lab Results  Component Value Date   LDLCALC 84 06/24/2022   Uncontrolled, pt declines statin for now, for lower chol diet  Coronary artery calcification seen on CT scan Noted on recent CT - for lower chol diet, declines other asa for now  Followup: Return in about 6 months (around 12/28/2023).  Oliver Barre, MD 07/01/2023 9:37 AM [facility]

## 2023-06-29 NOTE — Patient Instructions (Addendum)
Please consider the flu shot with oncology or the pharmacy  Please continue all other medications as before, and refills have been done if requested.  Please have the pharmacy call with any other refills you may need.  Please continue your efforts at being more active, low cholesterol diet, and weight control.  You are otherwise up to date with prevention measures today.  Please keep your appointments with your specialists as you may have planned  Please go to the LAB at the blood drawing area for the tests to be done  You will be contacted by phone if any changes need to be made immediately.  Otherwise, you will receive a letter about your results with an explanation, but please check with MyChart first.  Please make an Appointment to return in 6 months, or sooner if needed

## 2023-06-30 ENCOUNTER — Other Ambulatory Visit: Payer: Self-pay

## 2023-07-01 ENCOUNTER — Other Ambulatory Visit: Payer: Self-pay | Admitting: Oncology

## 2023-07-01 ENCOUNTER — Encounter: Payer: Self-pay | Admitting: Internal Medicine

## 2023-07-01 DIAGNOSIS — F32A Depression, unspecified: Secondary | ICD-10-CM | POA: Insufficient documentation

## 2023-07-01 DIAGNOSIS — I2510 Atherosclerotic heart disease of native coronary artery without angina pectoris: Secondary | ICD-10-CM | POA: Insufficient documentation

## 2023-07-01 DIAGNOSIS — I700 Atherosclerosis of aorta: Secondary | ICD-10-CM | POA: Insufficient documentation

## 2023-07-01 NOTE — Assessment & Plan Note (Signed)
Noted on recent CT - for lower chol diet, declines other asa for now

## 2023-07-01 NOTE — Assessment & Plan Note (Signed)
Last vitamin D Lab Results  Component Value Date   VD25OH 34.08 06/24/2022   Low, to start oral replacement

## 2023-07-01 NOTE — Assessment & Plan Note (Signed)
Lab Results  Component Value Date   LDLCALC 84 06/24/2022   Uncontrolled, pt declines statin for now, for lower chol diet

## 2023-07-01 NOTE — Assessment & Plan Note (Signed)
Has significant depressed affect today likely situational related to recent tx, but declines need for specific tx or counseling at this time

## 2023-07-01 NOTE — Assessment & Plan Note (Signed)
BP Readings from Last 3 Encounters:  06/29/23 122/70  06/23/23 122/69  06/23/23 122/88   Stable, pt to continue medical treatment  - diet, wt control

## 2023-07-01 NOTE — Assessment & Plan Note (Signed)
Lab Results  Component Value Date   HGBA1C 5.1 05/14/2023   Stable, pt to continue current medical treatment  - diet, wt control

## 2023-07-05 ENCOUNTER — Encounter: Payer: Self-pay | Admitting: *Deleted

## 2023-07-05 NOTE — Progress Notes (Signed)
Received fax from [facility] that tissue received on 07/04/23.

## 2023-07-06 ENCOUNTER — Telehealth: Payer: Self-pay

## 2023-07-06 ENCOUNTER — Inpatient Hospital Stay: Payer: Medicare Other

## 2023-07-06 ENCOUNTER — Inpatient Hospital Stay: Payer: Medicare Other | Admitting: Oncology

## 2023-07-06 ENCOUNTER — Inpatient Hospital Stay (HOSPITAL_BASED_OUTPATIENT_CLINIC_OR_DEPARTMENT_OTHER): Payer: Medicare Other | Admitting: Nurse Practitioner

## 2023-07-06 ENCOUNTER — Encounter: Payer: Self-pay | Admitting: Nurse Practitioner

## 2023-07-06 VITALS — BP 135/81 | HR 98 | Temp 98.1°F | Resp 18 | Ht 71.0 in | Wt 174.2 lb

## 2023-07-06 DIAGNOSIS — C241 Malignant neoplasm of ampulla of Vater: Secondary | ICD-10-CM | POA: Diagnosis not present

## 2023-07-06 DIAGNOSIS — Z5111 Encounter for antineoplastic chemotherapy: Secondary | ICD-10-CM | POA: Diagnosis not present

## 2023-07-06 LAB — CMP (CANCER CENTER ONLY)
ALT: 15 U/L (ref 0–44)
AST: 15 U/L (ref 15–41)
Albumin: 3.6 g/dL (ref 3.5–5.0)
Alkaline Phosphatase: 61 U/L (ref 38–126)
Anion gap: 6 (ref 5–15)
BUN: 11 mg/dL (ref 8–23)
CO2: 27 mmol/L (ref 22–32)
Calcium: 9.1 mg/dL (ref 8.9–10.3)
Chloride: 100 mmol/L (ref 98–111)
Creatinine: 0.82 mg/dL (ref 0.61–1.24)
GFR, Estimated: >60 mL/min (ref >60)
Glucose, Bld: 107 mg/dL — ABNORMAL HIGH (ref 70–99)
Potassium: 4 mmol/L (ref 3.5–5.1)
Sodium: 133 mmol/L — ABNORMAL LOW (ref 135–145)
Total Bilirubin: 0.7 mg/dL (ref 0.3–1.2)
Total Protein: 6.9 g/dL (ref 6.5–8.1)

## 2023-07-06 LAB — CBC WITH DIFFERENTIAL (CANCER CENTER ONLY)
Abs Immature Granulocytes: 0.02 10*3/uL (ref 0.00–0.07)
Basophils Absolute: 0 10*3/uL (ref 0.0–0.1)
Basophils Relative: 0 %
Eosinophils Absolute: 0.2 10*3/uL (ref 0.0–0.5)
Eosinophils Relative: 2 %
HCT: 38.8 % — ABNORMAL LOW (ref 39.0–52.0)
Hemoglobin: 13 g/dL (ref 13.0–17.0)
Immature Granulocytes: 0 %
Lymphocytes Relative: 20 %
Lymphs Abs: 1.9 10*3/uL (ref 0.7–4.0)
MCH: 29 pg (ref 26.0–34.0)
MCHC: 33.5 g/dL (ref 30.0–36.0)
MCV: 86.6 fL (ref 80.0–100.0)
Monocytes Absolute: 0.9 10*3/uL (ref 0.1–1.0)
Monocytes Relative: 10 %
Neutro Abs: 6.6 10*3/uL (ref 1.7–7.7)
Neutrophils Relative %: 68 %
Platelet Count: 246 10*3/uL (ref 150–400)
RBC: 4.48 MIL/uL (ref 4.22–5.81)
RDW: 16.1 % — ABNORMAL HIGH (ref 11.5–15.5)
WBC Count: 9.6 10*3/uL (ref 4.0–10.5)
nRBC: 0 % (ref 0.0–0.2)

## 2023-07-06 MED ORDER — SODIUM CHLORIDE 0.9 % IV SOLN
2000.0000 mg | Freq: Once | INTRAVENOUS | Status: AC
  Administered 2023-07-06: 2000 mg via INTRAVENOUS
  Filled 2023-07-06: qty 52.6

## 2023-07-06 MED ORDER — PROCHLORPERAZINE MALEATE 10 MG PO TABS
10.0000 mg | ORAL_TABLET | Freq: Once | ORAL | Status: AC
  Administered 2023-07-06: 10 mg via ORAL
  Filled 2023-07-06: qty 1

## 2023-07-06 MED ORDER — SODIUM CHLORIDE 0.9 % IV SOLN: Freq: Once | INTRAVENOUS | Status: AC

## 2023-07-06 MED ORDER — HEPARIN SOD (PORK) LOCK FLUSH 100 UNIT/ML IV SOLN
500.0000 [IU] | Freq: Once | INTRAVENOUS | Status: AC | PRN
  Administered 2023-07-06: 500 [IU]

## 2023-07-06 MED ORDER — SODIUM CHLORIDE 0.9% FLUSH
10.0000 mL | INTRAVENOUS | Status: DC | PRN
  Administered 2023-07-06: 10 mL

## 2023-07-06 NOTE — Telephone Encounter (Signed)
I have sent a referral to [facility] for an AFO brace to the fax number [phone].

## 2023-07-06 NOTE — Patient Instructions (Signed)
[facility]  Discharge Instructions: Thank you for choosing [facility] to provide your oncology and hematology care.   If you have a lab appointment with the Cancer Center, please go directly to the Cancer Center and check in at the registration area.   Wear comfortable clothing and clothing appropriate for easy access to any Portacath or PICC line.   We strive to give you quality time with your provider. You may need to reschedule your appointment if you arrive late (15 or more minutes).  Arriving late affects you and other patients whose appointments are after yours.  Also, if you miss three or more appointments without notifying the office, you may be dismissed from the clinic at the provider's discretion.      For prescription refill requests, have your pharmacy contact our office and allow 72 hours for refills to be completed.    Today you received the following chemotherapy and/or immunotherapy agents: gemcitabine      To help prevent nausea and vomiting after your treatment, we encourage you to take your nausea medication as directed.  BELOW ARE SYMPTOMS THAT SHOULD BE REPORTED IMMEDIATELY: *FEVER GREATER THAN 100.4 F (38 C) OR HIGHER *CHILLS OR SWEATING *NAUSEA AND VOMITING THAT IS NOT CONTROLLED WITH YOUR NAUSEA MEDICATION *UNUSUAL SHORTNESS OF BREATH *UNUSUAL BRUISING OR BLEEDING *URINARY PROBLEMS (pain or burning when urinating, or frequent urination) *BOWEL PROBLEMS (unusual diarrhea, constipation, pain near the anus) TENDERNESS IN MOUTH AND THROAT WITH OR WITHOUT PRESENCE OF ULCERS (sore throat, sores in mouth, or a toothache) UNUSUAL RASH, SWELLING OR PAIN  UNUSUAL VAGINAL DISCHARGE OR ITCHING   Items with * indicate a potential emergency and should be followed up as soon as possible or go to the Emergency Department if any problems should occur.  Please show the CHEMOTHERAPY ALERT CARD or IMMUNOTHERAPY ALERT CARD at  check-in to the Emergency Department and triage nurse.  Should you have questions after your visit or need to cancel or reschedule your appointment, please contact [facility]  Dept: [phone]  and follow the prompts.  Office hours are 8:00 a.m. to 4:30 p.m. Monday - Friday. Please note that voicemails left after 4:00 p.m. may not be returned until the following business day.  We are closed weekends and major holidays. You have access to a nurse at all times for urgent questions. Please call the main number to the clinic Dept: [phone] and follow the prompts.   For any non-urgent questions, you may also contact your provider using MyChart. We now offer e-Visits for anyone 71 and older to request care online for non-urgent symptoms. For details visit [email or web address].   Also download the MyChart app! Go to the app store, search "MyChart", open the app, select , and log in with your MyChart username and password.  Gemcitabine Injection What is this medication? GEMCITABINE (jem SYE ta been) treats some types of cancer. It works by slowing down the growth of cancer cells. This medicine may be used for other purposes; ask your health care provider or pharmacist if you have questions. COMMON BRAND NAME(S): Gemzar, Infugem What should I tell my care team before I take this medication? They need to know if you have any of these conditions: Blood disorders Infection Kidney disease Liver disease Lung or breathing disease, such as asthma or COPD Recent or ongoing radiation therapy An unusual or allergic reaction to gemcitabine, other medications, foods, dyes, or preservatives If  you or your partner are pregnant or trying to get pregnant Breast-feeding How should I use this medication? This medication is injected into a vein. It is given by your care team in a hospital or clinic setting. Talk to your care team about the use of this medication  in children. Special care may be needed. Overdosage: If you think you have taken too much of this medicine contact a poison control center or emergency room at once. NOTE: This medicine is only for you. Do not share this medicine with others. What if I miss a dose? Keep appointments for follow-up doses. It is important not to miss your dose. Call your care team if you are unable to keep an appointment. What may interact with this medication? Interactions have not been studied. This list may not describe all possible interactions. Give your health care provider a list of all the medicines, herbs, non-prescription drugs, or dietary supplements you use. Also tell them if you smoke, drink alcohol, or use illegal drugs. Some items may interact with your medicine. What should I watch for while using this medication? Your condition will be monitored carefully while you are receiving this medication. This medication may make you feel generally unwell. This is not uncommon, as chemotherapy can affect healthy cells as well as cancer cells. Report any side effects. Continue your course of treatment even though you feel ill unless your care team tells you to stop. In some cases, you may be given additional medications to help with side effects. Follow all directions for their use. This medication may increase your risk of getting an infection. Call your care team for advice if you get a fever, chills, sore throat, or other symptoms of a cold or flu. Do not treat yourself. Try to avoid being around people who are sick. This medication may increase your risk to bruise or bleed. Call your care team if you notice any unusual bleeding. Be careful brushing or flossing your teeth or using a toothpick because you may get an infection or bleed more easily. If you have any dental work done, tell your dentist you are receiving this medication. Avoid taking medications that contain aspirin, acetaminophen, ibuprofen,  naproxen, or ketoprofen unless instructed by your care team. These medications may hide a fever. Talk to your care team if you or your partner wish to become pregnant or think you might be pregnant. This medication can cause serious birth defects if taken during pregnancy and for 6 months after the last dose. A negative pregnancy test is required before starting this medication. A reliable form of contraception is recommended while taking this medication and for 6 months after the last dose. Talk to your care team about effective forms of contraception. Do not father a child while taking this medication and for 3 months after the last dose. Use a condom while having sex during this time period. Do not breastfeed while taking this medication and for at least 1 week after the last dose. This medication may cause infertility. Talk to your care team if you are concerned about your fertility. What side effects may I notice from receiving this medication? Side effects that you should report to your care team as soon as possible: Allergic reactions--skin rash, itching, hives, swelling of the face, lips, tongue, or throat Capillary leak syndrome--stomach or muscle pain, unusual weakness or fatigue, feeling faint or lightheaded, decrease in the amount of urine, swelling of the ankles, hands, or feet, trouble breathing Infection--fever, chills, cough,  sore throat, wounds that don't heal, pain or trouble when passing urine, general feeling of discomfort or being unwell Liver injury--right upper belly pain, loss of appetite, nausea, light-colored stool, dark yellow or brown urine, yellowing skin or eyes, unusual weakness or fatigue Low red blood cell level--unusual weakness or fatigue, dizziness, headache, trouble breathing Lung injury--shortness of breath or trouble breathing, cough, spitting up blood, chest pain, fever Stomach pain, bloody diarrhea, pale skin, unusual weakness or fatigue, decrease in the amount of  urine, which may be signs of hemolytic uremic syndrome Sudden and severe headache, confusion, change in vision, seizures, which may be signs of posterior reversible encephalopathy syndrome (PRES) Unusual bruising or bleeding Side effects that usually do not require medical attention (report to your care team if they continue or are bothersome): Diarrhea Drowsiness Hair loss Nausea Pain, redness, or swelling with sores inside the mouth or throat Vomiting This list may not describe all possible side effects. Call your doctor for medical advice about side effects. You may report side effects to FDA at 1-800-FDA-1088. Where should I keep my medication? This medication is given in a hospital or clinic. It will not be stored at home. NOTE: This sheet is a summary. It may not cover all possible information. If you have questions about this medicine, talk to your doctor, pharmacist, or health care provider.  2024 Elsevier/Gold Standard (2022-01-13 00:00:00)

## 2023-07-06 NOTE — Progress Notes (Signed)
  [facility] OFFICE PROGRESS NOTE   Diagnosis: Ampullary carcinoma  INTERVAL HISTORY:   Charles Lawson returns as scheduled.  He began cycle 1 gemcitabine/capecitabine 06/23/2023.  He was fatigued a few days after treatment.  He had mild nausea on day 2.  No vomiting.  No mouth sores.  No diarrhea.  No hand or foot pain or redness.  He continues to note a right foot drop.  No back pain.  No bowel or bladder dysfunction.  Objective:  Vital signs in last 24 hours:  Blood pressure 135/81, pulse 98, temperature 98.1 F (36.7 C), temperature source Temporal, resp. rate 18, height 5\' 11"  (1.803 m), weight 174 lb 3.2 oz (79 kg), SpO2 99%.    HEENT: No thrush or ulcers. Resp: Lungs clear bilaterally. Cardio: Regular rate and rhythm. GI: Abdomen soft and nontender.  No hepatosplenomegaly. Vascular: No leg edema. Neuro: Right foot drop.  Leg strength otherwise intact. Skin: Palms without erythema. Port-A-Cath without erythema.  Lab Results:  Lab Results  Component Value Date   WBC 9.6 07/06/2023   HGB 13.0 07/06/2023   HCT 38.8 (L) 07/06/2023   MCV 86.6 07/06/2023   PLT 246 07/06/2023   NEUTROABS 6.6 07/06/2023    Imaging:  No results found.  Medications: I have reviewed the patient's current medications.  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 pylori gastritis 05/10/2023-we will confirm treatment plan with Dr. Leone Payor  Disposition: Charles Lawson appears stable.  He began cycle 1 gemcitabine/capecitabine 06/23/2023.  He tolerated the Gemcitabine well overall.   He seems to be tolerating capecitabine well.  Plan to proceed with cycle 1 day 15 Gemcitabine today as scheduled.  He will continue capecitabine to complete 21 days followed by 7-day break.  CBC and chemistry panel reviewed.  Labs adequate to proceed as above.  He will return for follow-up and treatment in 2 weeks.  Lonna Cobb ANP/GNP-BC   07/06/2023  11:28 AM

## 2023-07-06 NOTE — Progress Notes (Signed)
Patient seen by Lonna Cobb NP today  Vitals are within treatment parameters:Yes   Labs are within treatment parameters: Yes   Treatment plan has been signed: Yes   Per physician team, Patient is ready for treatment and there are NO modifications to the treatment plan.

## 2023-07-06 NOTE — Patient Instructions (Signed)

## 2023-07-07 ENCOUNTER — Other Ambulatory Visit: Payer: Self-pay | Admitting: Nurse Practitioner

## 2023-07-07 ENCOUNTER — Telehealth: Payer: Self-pay

## 2023-07-07 DIAGNOSIS — M21371 Foot drop, right foot: Secondary | ICD-10-CM

## 2023-07-07 DIAGNOSIS — C241 Malignant neoplasm of ampulla of Vater: Secondary | ICD-10-CM

## 2023-07-07 NOTE — Telephone Encounter (Signed)
The patient has an appointment scheduled at [facility] on July 16, 2023, at 9:30 AM. The patient is aware of the date and time of the appointment.

## 2023-07-07 NOTE — Telephone Encounter (Signed)
-----   Message from Lonna Cobb sent at 07/07/2023  9:13 AM EDT ----- Please let him know MRI lumbar spine is the recommended imaging for the right foot drop.  I have placed the order.  Please make sure this gets scheduled.  Thanks

## 2023-07-09 DIAGNOSIS — C241 Malignant neoplasm of ampulla of Vater: Secondary | ICD-10-CM | POA: Diagnosis not present

## 2023-07-12 ENCOUNTER — Other Ambulatory Visit: Payer: Self-pay | Admitting: Oncology

## 2023-07-12 ENCOUNTER — Other Ambulatory Visit (HOSPITAL_COMMUNITY): Payer: Self-pay

## 2023-07-12 DIAGNOSIS — C241 Malignant neoplasm of ampulla of Vater: Secondary | ICD-10-CM

## 2023-07-12 NOTE — Progress Notes (Signed)
Specialty Pharmacy Refill Coordination Note  Charles Lawson is a 82 y.o. male contacted today regarding refills of specialty medication(s) Capecitabine   Patient requested Delivery   Delivery date: 07/16/23   Verified address: [address] [place] [address]   Medication will be filled on 07/15/23. *Pending refill request*

## 2023-07-12 NOTE — Progress Notes (Signed)
Specialty Pharmacy Ongoing Clinical Assessment Note  Charles Lawson is a 82 y.o. male who is being followed by the specialty pharmacy service for RxSp Oncology   Patient's specialty medication(s) reviewed today: Capecitabine   Missed doses in the last 4 weeks: 0   Patient/Caregiver did not have any additional questions or concerns.   Therapeutic benefit summary: Patient is achieving benefit   Adverse events/side effects summary: Experienced adverse events/side effects (sore spot in mouth, they will reach out to the MD's office to see about getting magic mouthwash)   Patient's therapy is appropriate to: Continue    Goals Addressed             This Visit's Progress    Achieve or maintain remission       Patient is on track. Patient will maintain adherence.  Charles Lawson is stable on adjuvant therapy.          Follow up:  3 months  Servando Snare Specialty Pharmacist

## 2023-07-13 ENCOUNTER — Other Ambulatory Visit (HOSPITAL_COMMUNITY): Payer: Self-pay

## 2023-07-13 MED ORDER — CAPECITABINE 500 MG PO TABS
1500.0000 mg | ORAL_TABLET | Freq: Two times a day (BID) | ORAL | 0 refills | Status: DC
  Filled 2023-07-13: qty 126, 28d supply, fill #0

## 2023-07-16 ENCOUNTER — Ambulatory Visit (HOSPITAL_COMMUNITY)
Admission: RE | Admit: 2023-07-16 | Discharge: 2023-07-16 | Disposition: A | Discharge status: Home / Self Care | Payer: Medicare Other | Source: Ambulatory Visit | Attending: Nurse Practitioner | Admitting: Nurse Practitioner

## 2023-07-16 DIAGNOSIS — M5126 Other intervertebral disc displacement, lumbar region: Secondary | ICD-10-CM | POA: Diagnosis not present

## 2023-07-16 DIAGNOSIS — M48061 Spinal stenosis, lumbar region without neurogenic claudication: Secondary | ICD-10-CM | POA: Diagnosis not present

## 2023-07-16 DIAGNOSIS — C241 Malignant neoplasm of ampulla of Vater: Secondary | ICD-10-CM | POA: Insufficient documentation

## 2023-07-16 DIAGNOSIS — M21371 Foot drop, right foot: Secondary | ICD-10-CM | POA: Insufficient documentation

## 2023-07-16 DIAGNOSIS — C7651 Malignant neoplasm of right lower limb: Secondary | ICD-10-CM | POA: Diagnosis not present

## 2023-07-16 DIAGNOSIS — M4807 Spinal stenosis, lumbosacral region: Secondary | ICD-10-CM | POA: Diagnosis not present

## 2023-07-16 MED ORDER — GADOBUTROL 1 MMOL/ML IV SOLN
8.0000 mL | Freq: Once | INTRAVENOUS | Status: AC | PRN
  Administered 2023-07-16: 8 mL via INTRAVENOUS

## 2023-07-17 ENCOUNTER — Other Ambulatory Visit: Payer: Self-pay | Admitting: Oncology

## 2023-07-19 NOTE — Therapy (Unsigned)
OUTPATIENT PHYSICAL THERAPY  LOWER EXTREMITY ONCOLOGY EVALUATION  Patient Name: Charles Lawson MRN: [id number] DOB:1941-08-24, 82 y.o., male Today's Date: 07/19/2023  END OF SESSION:   Past Medical History:  Diagnosis Date   Allergic rhinitis, cause unspecified    Arthritis    Blood pressure elevated without history of HTN    BPH (benign prostatic hyperplasia)    Diverticulosis of colon    ED (erectile dysfunction)    History of basal cell carcinoma (BCC) excision    Hyperlipidemia    Impaired glucose tolerance 04/03/2013   Personal history of colonic polyps 06/07/2012   Past Surgical History:  Procedure Laterality Date   Quintella Reichert OSTEOTOMY Left 07/19/2020   Procedure: Quintella Reichert OSTEOTOMY;  Surgeon: Edwin Cap, DPM;  Location: [facility];  Service: Podiatry;  Laterality: Left;   BIOPSY  05/10/2023   Procedure: BIOPSY;  Surgeon: Iva Boop, MD;  Location: [facility];  Service: Gastroenterology;;   BIOPSY  05/12/2023   Procedure: BIOPSY;  Surgeon: Iva Boop, MD;  Location: [facility];  Service: Gastroenterology;;   Arbutus Leas Left 07/19/2020   Procedure: Delos Haring;  Surgeon: Edwin Cap, DPM;  Location: [facility];  Service: Podiatry;  Laterality: Left;   COLONOSCOPY  last one 10-06-2017  dr Leone Payor   ESOPHAGOGASTRODUODENOSCOPY N/A 05/12/2023   Procedure: ESOPHAGOGASTRODUODENOSCOPY (EGD);  Surgeon: Iva Boop, MD;  Location: [facility];  Service: Gastroenterology;  Laterality: N/A;   ESOPHAGOGASTRODUODENOSCOPY (EGD) WITH PROPOFOL N/A 05/10/2023   Procedure: ESOPHAGOGASTRODUODENOSCOPY (EGD) WITH PROPOFOL;  Surgeon: Iva Boop, MD;  Location: [facility];  Service: Gastroenterology;  Laterality: N/A;   HAMMER TOE SURGERY Left 07/19/2020   Procedure: HAMMER TOE CORRECTION;  Surgeon: Edwin Cap, DPM;  Location: [facility];  Service: Podiatry;  Laterality: Left;   INGUINAL HERNIA REPAIR  Right 11-21-2008  @ [facility]   IR IMAGING GUIDED PORT INSERTION  06/18/2023   LUMBAR LAMINECTOMY  1974   METATARSAL OSTEOTOMY Left 07/19/2020   Procedure: METATARSAL OSTEOTOMY;  Surgeon: Edwin Cap, DPM;  Location: [facility] [place];  Service: Podiatry;  Laterality: Left;   ORIF TOE FRACTURE Left 07/19/2020   Procedure: OPEN TREATMENT OF METARSAL PHALANGEAL JOINT DISLOCATION;  Surgeon: Edwin Cap, DPM;  Location: [facility] [place];  Service: Podiatry;  Laterality: Left;  ANKLE BLOCK WITH THIGH TOURNIQUET   Patient Active Problem List   Diagnosis Date Noted   Depression 07/01/2023   Coronary artery calcification seen on CT scan 07/01/2023   Aortic atherosclerosis (HCC) 07/01/2023   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   Malnutrition of moderate degree 05/14/2023   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   Gastric outlet obstruction 05/10/2023   Duodenitis 05/08/2023   Thrombocytopenia (HCC) 05/08/2023   Epigastric pain 04/30/2023   Asthmatic bronchitis 10/09/2021   Impacted ear wax 10/09/2021   Vitamin D deficiency 06/23/2021   Sinus infection 07/29/2020   Hammertoe of left foot    Tailor's bunion of left foot    Deformity of metatarsal bone of left foot    Injury of plantar plate of left foot    Hallux interphalangeus, acquired, left    Blood pressure elevated without history of HTN 04/02/2020   Ingrown nail 06/16/2019   Acute conjunctivitis of right eye 12/13/2018   Acute pharyngitis 12/13/2018   Dizziness 06/14/2018   Left inguinal hernia 06/11/2017   Mass of left side of neck 06/11/2017   Rash and nonspecific skin eruption 05/25/2014   Impaired glucose tolerance  04/03/2013   Hives 03/10/2013   History of colonic polyps 06/07/2012   Fatigue 03/30/2012   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  ONSET DATE: 06/15/2023  Rationale for Evaluation and Treatment: Rehabilitation  SUBJECTIVE:                                                                                                                                                                                           SUBJECTIVE STATEMENT:  Pt reports he has an appt scheduled for an AFO for his right foot but wanted to come to therapy to have his balance and strength checked. He developed Foot drop right after he got out of the hospital.  He is presently unable to drive due to foot drop. He has no appetite and has no energy for about 3 days after his infusion, but otherwise he is doing well. I am doing pretty well I think. 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 Duodenal obstruction August 2024 secondary to #1 H. pylori gastritis 8/19/202 Pt developed Right foot drop. Had lumbar MRI 07/16/2023. He has an appt scheduled for AFO    PAIN:  Are you having pain? No, just numbness in the right lower leg into the foot.  PRECAUTIONS: Fall risk;drop foot  RED FLAGS: None   WEIGHT BEARING RESTRICTIONS: No  FALLS:  Has patient fallen in last 6 months? No  LIVING ENVIRONMENT: Lives with: lives with their spouse Lives in: House/apartment Stairs: Yes; Internal: 13 steps; on left going up and External: 3 steps; none Has following  equipment at home: Quad cane large base and Walker - 2 wheeled  OCCUPATION: retired  Human resources officer: enjoys exercising, pool  PRIOR LEVEL OF FUNCTION: Independent  PATIENT GOALS: improve ankle ROM, balance, be able to drive   OBJECTIVE: Note: Objective measures were completed at Evaluation unless otherwise noted.  COGNITION: Overall cognitive status: Within functional limits for tasks assessed   PALPATION: NT  OBSERVATIONS / OTHER ASSESSMENTS:ambulates with steppage gait due  to Right foot drop   SENSATION: Light touch: Deficits , numbness in right    POSTURE: forward head, rounded shoulders  LOWER EXTREMITY STRENGTH:  MMT Right eval  Hip flexion 5  Hip extension Able to bridge  Hip abduction 4+  Hip adduction   Hip internal rotation 4+  Hip external rotation 4+  Knee flexion 5  Knee extension 5  Ankle dorsiflexion trace  Ankle plantarflexion 3+  Ankle inversion 2-  Ankle eversion 1+  Great toe extension 0   (Blank rows = not tested)  MMT LEFT eval  Hip flexion 4+  Hip extension Can bridge  Hip abduction 4+  Hip adduction   Hip internal rotation 4  Hip external rotation 5  Knee flexion 5  Knee extension 4+  Ankle dorsiflexion 4+  Ankle plantarflexion 5  Ankle inversion 4  Ankle eversion 4  Great toe extension     (Blank rows = not tested)  LYMPHEDEMA ASSESSMENTS:   SURGERY TYPE/DATE: 05/17/2023 Pancreatic Duodenectomy  NUMBER OF LYMPH NODES REMOVED: 3 he thinks  CHEMOTHERAPY: Yes currently  RADIATION:NO  HORMONE TREATMENT: NO  INFECTIONS: NO  FUNCTIONAL TESTS:  5 times sit to stand 16.75 sec  4 position balance test; able to maintain all but Right SLS for 10 sec  GAIT: Distance walked: steppage gait due to drop foot on right Assistive device utilized: Quad cane small base Level of assistance: modified independence Comments: Pt needed instruction to use cane in his left UE, and to place it squarly on the floor. Adjusted cane height and  base so he could use properly on the left and practiced walking in the gym.    Outcome measure: LE functional Scale:18   TODAY'S TREATMENT:                                                                                                                                          DATE:   07/20/2023 Educated pt in right gastroc stretch with strap and standing runners stretch at wall. Gave illustrated and written instructions. Also educated pt in proper way to ambulate with quad cane in his left UE with proper cane placement and the need to support Right LE. Also practiced some with SPC. He is getting his AFO next week.    PATIENT EDUCATION:  Education details: Access Code: [email or web address] URL: [email or web address] Date: 07/20/2023 Prepared by: Alvira Monday  Exercises - Seated Calf Stretch with Strap  - 2 x daily - 7 x weekly - 1 sets - 3 reps - 30 hold - Standing Gastroc Stretch  - 1 x daily - 7 x weekly - 1 sets - 3 reps - 30 hold Person educated: Patient Education method: Explanation, Demonstration, and Handouts Education comprehension: returned demonstration and needs further education  HOME EXERCISE PROGRAM: Seated gastroc stretch with strap, standing runners stretch  ASSESSMENT:  CLINICAL IMPRESSION: Patient is a 82 y.o. male  who was seen today for physical therapy evaluation and treatment s/p  a pancreaticoduodenectomy with lymphovascular invasion on 05/17/2023. Pt developed right foot drop/numbness around 06/15/2023 for unknown reasons. He was trying to ambulate with a SBQC but was using the cane in the wrong hand and not using the cane properly. He has only trace movement of right ankle DF, and has extensive weakness in IN and EV, and mild weakness in PF.  He is ambulating with a steppage gait and is at risk for falls. His strength in remaining muscles is quite good . He is scheduled to get an AFO next week, and until then he is not able to drive and is very limited  with home activities. He will benefit from skilled therapy to address strength and balance deficits, and gait to enable him to resume his normal functional activities including return to gym activities.  OBJECTIVE IMPAIRMENTS: Abnormal gait, decreased activity tolerance, decreased balance, decreased knowledge of condition, decreased ROM, impaired sensation, and postural dysfunction.   ACTIVITY LIMITATIONS: carrying, standing, stairs, and locomotion level  PARTICIPATION LIMITATIONS: driving, shopping, community activity, and yard work  PERSONAL FACTORS: Age and 1-2 comorbidities: Cancer, chemo, foot drop  are also affecting patient's functional outcome.   REHAB POTENTIAL: Good  CLINICAL DECISION MAKING: Stable/uncomplicated  EVALUATION COMPLEXITY: Low   GOALS: Goals reviewed with patient? Yes  SHORT TERM GOALS    Target date: 08/10/2023  Pt will  walk with more normal gait pattern utilizing an AFO Baseline: Goal status: INITIAL   2.  Pt will improve LEFS to atleast 35/80 for improved function Baseline:  Goal status: INITIAL  3.  Pt will be able to maintain tandem stance with right foot behind for 15 seconds to decrease fall risk Baseline:  Goal status: INITIAL  4.  Pt will be independent with HEP for improved strength, balance and ankle ROM Baseline:  Goal status: INITIAL  5. Pt will be able to resume driving with AFO GOAL status;INITIAL   LONG TERM GOALS: Target date: 08/31/2023  Pt will improve 5 time sit to stand test to no greater than 14.8 seconds to decrease fall risk Baseline:  Goal status: INITIAL  2.  Pt will have LEFS increased to 45/80 for improved function Baseline:  Goal status: INITIAL  3.  Pt will have right ankle PROM maintained at 5-8 degrees for improved function Baseline:  Goal status: INITIAL  4.  Pt will ambulate safely  with near normal gait without AD using AFO Baseline:  Goal status: INITIAL  5.  Pt will be able to go up and down  stairs with near normal gait using hand rail Baseline:  Goal status: INITIAL 6.  Pt will have right SLS 10 seconds with AFO  Baseline 5 sec   PLAN:  PT FREQUENCY: 2x/week  PT DURATION: 6 weeks  PLANNED INTERVENTIONS: 16109- PT Re-evaluation, 97110-Therapeutic exercises, 97112- Neuromuscular re-education, 97535- Self Care, 60454- Manual therapy, [phone]- Gait training, Patient/Family education, Balance training, Joint mobilization, Therapeutic exercises, Neuromuscular re-education, Gait training, and Self Care, aquatic therapy  PLAN FOR NEXT SESSION: measure PROM DF, gait training with AD prn, NU step, balance, step ups, review gastroc stretches, generalized strength, Fxl activities   Waynette Buttery, PT 07/19/2023, 5:37 PM

## 2023-07-20 ENCOUNTER — Ambulatory Visit: Payer: Medicare Other | Attending: Nurse Practitioner

## 2023-07-20 ENCOUNTER — Other Ambulatory Visit: Payer: Self-pay

## 2023-07-20 DIAGNOSIS — R262 Difficulty in walking, not elsewhere classified: Secondary | ICD-10-CM | POA: Diagnosis not present

## 2023-07-20 DIAGNOSIS — M6281 Muscle weakness (generalized): Secondary | ICD-10-CM | POA: Insufficient documentation

## 2023-07-20 DIAGNOSIS — M21371 Foot drop, right foot: Secondary | ICD-10-CM | POA: Diagnosis not present

## 2023-07-20 DIAGNOSIS — C241 Malignant neoplasm of ampulla of Vater: Secondary | ICD-10-CM | POA: Diagnosis not present

## 2023-07-21 ENCOUNTER — Inpatient Hospital Stay: Payer: Medicare Other

## 2023-07-21 ENCOUNTER — Inpatient Hospital Stay (HOSPITAL_BASED_OUTPATIENT_CLINIC_OR_DEPARTMENT_OTHER): Payer: Medicare Other | Admitting: Nurse Practitioner

## 2023-07-21 ENCOUNTER — Encounter: Payer: Self-pay | Admitting: Nurse Practitioner

## 2023-07-21 ENCOUNTER — Inpatient Hospital Stay: Payer: Medicare Other | Admitting: Nutrition

## 2023-07-21 VITALS — BP 129/79 | HR 72

## 2023-07-21 DIAGNOSIS — Z5111 Encounter for antineoplastic chemotherapy: Secondary | ICD-10-CM | POA: Diagnosis not present

## 2023-07-21 DIAGNOSIS — C241 Malignant neoplasm of ampulla of Vater: Secondary | ICD-10-CM

## 2023-07-21 LAB — CBC WITH DIFFERENTIAL (CANCER CENTER ONLY)
Abs Immature Granulocytes: 0.02 10*3/uL (ref 0.00–0.07)
Basophils Absolute: 0 10*3/uL (ref 0.0–0.1)
Basophils Relative: 1 %
Eosinophils Absolute: 0.2 10*3/uL (ref 0.0–0.5)
Eosinophils Relative: 3 %
HCT: 38.7 % — ABNORMAL LOW (ref 39.0–52.0)
Hemoglobin: 13.2 g/dL (ref 13.0–17.0)
Immature Granulocytes: 0 %
Lymphocytes Relative: 33 %
Lymphs Abs: 2.5 10*3/uL (ref 0.7–4.0)
MCH: 30.8 pg (ref 26.0–34.0)
MCHC: 34.1 g/dL (ref 30.0–36.0)
MCV: 90.2 fL (ref 80.0–100.0)
Monocytes Absolute: 1 10*3/uL (ref 0.1–1.0)
Monocytes Relative: 13 %
Neutro Abs: 3.8 10*3/uL (ref 1.7–7.7)
Neutrophils Relative %: 50 %
Platelet Count: 225 10*3/uL (ref 150–400)
RBC: 4.29 MIL/uL (ref 4.22–5.81)
RDW: 21.2 % — ABNORMAL HIGH (ref 11.5–15.5)
WBC Count: 7.5 10*3/uL (ref 4.0–10.5)
nRBC: 0 % (ref 0.0–0.2)

## 2023-07-21 LAB — CMP (CANCER CENTER ONLY)
ALT: 28 U/L (ref 0–44)
AST: 26 U/L (ref 15–41)
Albumin: 3.9 g/dL (ref 3.5–5.0)
Alkaline Phosphatase: 71 U/L (ref 38–126)
Anion gap: 7 (ref 5–15)
BUN: 9 mg/dL (ref 8–23)
CO2: 27 mmol/L (ref 22–32)
Calcium: 9.4 mg/dL (ref 8.9–10.3)
Chloride: 102 mmol/L (ref 98–111)
Creatinine: 0.85 mg/dL (ref 0.61–1.24)
GFR, Estimated: >60 mL/min (ref >60)
Glucose, Bld: 120 mg/dL — ABNORMAL HIGH (ref 70–99)
Potassium: 3.8 mmol/L (ref 3.5–5.1)
Sodium: 136 mmol/L (ref 135–145)
Total Bilirubin: 0.6 mg/dL (ref 0.3–1.2)
Total Protein: 6.5 g/dL (ref 6.5–8.1)

## 2023-07-21 MED ORDER — PROCHLORPERAZINE MALEATE 10 MG PO TABS
10.0000 mg | ORAL_TABLET | Freq: Once | ORAL | Status: AC
Start: 2023-07-21 — End: 2023-07-21
  Administered 2023-07-21: 10 mg via ORAL
  Filled 2023-07-21: qty 1

## 2023-07-21 MED ORDER — HEPARIN SOD (PORK) LOCK FLUSH 100 UNIT/ML IV SOLN
500.0000 [IU] | Freq: Once | INTRAVENOUS | Status: AC | PRN
  Administered 2023-07-21: 500 [IU]

## 2023-07-21 MED ORDER — SODIUM CHLORIDE 0.9% FLUSH
10.0000 mL | INTRAVENOUS | Status: DC | PRN
  Administered 2023-07-21: 10 mL

## 2023-07-21 MED ORDER — SODIUM CHLORIDE 0.9 % IV SOLN: Freq: Once | INTRAVENOUS | Status: AC

## 2023-07-21 MED ORDER — SODIUM CHLORIDE 0.9 % IV SOLN
2000.0000 mg | Freq: Once | INTRAVENOUS | Status: AC
Start: 2023-07-21 — End: 2023-07-21
  Administered 2023-07-21: 2000 mg via INTRAVENOUS
  Filled 2023-07-21: qty 52.6

## 2023-07-21 NOTE — Patient Instructions (Signed)
[place] [facility]   Discharge Instructions: Thank you for choosing [facility] to provide your oncology and hematology care.   If you have a lab appointment with the Cancer Center, please go directly to the Cancer Center and check in at the registration area.   Wear comfortable clothing and clothing appropriate for easy access to any Portacath or PICC line.   We strive to give you quality time with your provider. You may need to reschedule your appointment if you arrive late (15 or more minutes).  Arriving late affects you and other patients whose appointments are after yours.  Also, if you miss three or more appointments without notifying the office, you may be dismissed from the clinic at the provider's discretion.      For prescription refill requests, have your pharmacy contact our office and allow 72 hours for refills to be completed.    Today you received the following chemotherapy and/or immunotherapy agents Gemzar      To help prevent nausea and vomiting after your treatment, we encourage you to take your nausea medication as directed.  BELOW ARE SYMPTOMS THAT SHOULD BE REPORTED IMMEDIATELY: *FEVER GREATER THAN 100.4 F (38 C) OR HIGHER *CHILLS OR SWEATING *NAUSEA AND VOMITING THAT IS NOT CONTROLLED WITH YOUR NAUSEA MEDICATION *UNUSUAL SHORTNESS OF BREATH *UNUSUAL BRUISING OR BLEEDING *URINARY PROBLEMS (pain or burning when urinating, or frequent urination) *BOWEL PROBLEMS (unusual diarrhea, constipation, pain near the anus) TENDERNESS IN MOUTH AND THROAT WITH OR WITHOUT PRESENCE OF ULCERS (sore throat, sores in mouth, or a toothache) UNUSUAL RASH, SWELLING OR PAIN  UNUSUAL VAGINAL DISCHARGE OR ITCHING   Items with * indicate a potential emergency and should be followed up as soon as possible or go to the Emergency Department if any problems should occur.  Please show the CHEMOTHERAPY ALERT CARD or IMMUNOTHERAPY ALERT CARD at check-in  to the Emergency Department and triage nurse.  Should you have questions after your visit or need to cancel or reschedule your appointment, please contact [place] [facility]  Dept: [phone]  and follow the prompts.  Office hours are 8:00 a.m. to 4:30 p.m. Monday - Friday. Please note that voicemails left after 4:00 p.m. may not be returned until the following business day.  We are closed weekends and major holidays. You have access to a nurse at all times for urgent questions. Please call the main number to the clinic Dept: [phone] and follow the prompts.   For any non-urgent questions, you may also contact your provider using MyChart. We now offer e-Visits for anyone 79 and older to request care online for non-urgent symptoms. For details visit [email or web address].   Also download the MyChart app! Go to the app store, search "MyChart", open the app, select [place], and log in with your MyChart username and password.  Gemcitabine Injection What is this medication? GEMCITABINE (jem SYE ta been) treats some types of cancer. It works by slowing down the growth of cancer cells. This medicine may be used for other purposes; ask your health care provider or pharmacist if you have questions. COMMON BRAND NAME(S): Gemzar, Infugem What should I tell my care team before I take this medication? They need to know if you have any of these conditions: Blood disorders Infection Kidney disease Liver disease Lung or breathing disease, such as asthma or COPD Recent or ongoing radiation therapy An unusual or allergic reaction to gemcitabine, other medications, foods, dyes, or preservatives  If you or your partner are pregnant or trying to get pregnant Breast-feeding How should I use this medication? This medication is injected into a vein. It is given by your care team in a hospital or clinic setting. Talk to your care team about the use of this medication in  children. Special care may be needed. Overdosage: If you think you have taken too much of this medicine contact a poison control center or emergency room at once. NOTE: This medicine is only for you. Do not share this medicine with others. What if I miss a dose? Keep appointments for follow-up doses. It is important not to miss your dose. Call your care team if you are unable to keep an appointment. What may interact with this medication? Interactions have not been studied. This list may not describe all possible interactions. Give your health care provider a list of all the medicines, herbs, non-prescription drugs, or dietary supplements you use. Also tell them if you smoke, drink alcohol, or use illegal drugs. Some items may interact with your medicine. What should I watch for while using this medication? Your condition will be monitored carefully while you are receiving this medication. This medication may make you feel generally unwell. This is not uncommon, as chemotherapy can affect healthy cells as well as cancer cells. Report any side effects. Continue your course of treatment even though you feel ill unless your care team tells you to stop. In some cases, you may be given additional medications to help with side effects. Follow all directions for their use. This medication may increase your risk of getting an infection. Call your care team for advice if you get a fever, chills, sore throat, or other symptoms of a cold or flu. Do not treat yourself. Try to avoid being around people who are sick. This medication may increase your risk to bruise or bleed. Call your care team if you notice any unusual bleeding. Be careful brushing or flossing your teeth or using a toothpick because you may get an infection or bleed more easily. If you have any dental work done, tell your dentist you are receiving this medication. Avoid taking medications that contain aspirin, acetaminophen, ibuprofen, naproxen,  or ketoprofen unless instructed by your care team. These medications may hide a fever. Talk to your care team if you or your partner wish to become pregnant or think you might be pregnant. This medication can cause serious birth defects if taken during pregnancy and for 6 months after the last dose. A negative pregnancy test is required before starting this medication. A reliable form of contraception is recommended while taking this medication and for 6 months after the last dose. Talk to your care team about effective forms of contraception. Do not father a child while taking this medication and for 3 months after the last dose. Use a condom while having sex during this time period. Do not breastfeed while taking this medication and for at least 1 week after the last dose. This medication may cause infertility. Talk to your care team if you are concerned about your fertility. What side effects may I notice from receiving this medication? Side effects that you should report to your care team as soon as possible: Allergic reactions--skin rash, itching, hives, swelling of the face, lips, tongue, or throat Capillary leak syndrome--stomach or muscle pain, unusual weakness or fatigue, feeling faint or lightheaded, decrease in the amount of urine, swelling of the ankles, hands, or feet, trouble breathing Infection--fever, chills,  cough, sore throat, wounds that don't heal, pain or trouble when passing urine, general feeling of discomfort or being unwell Liver injury--right upper belly pain, loss of appetite, nausea, light-colored stool, dark yellow or brown urine, yellowing skin or eyes, unusual weakness or fatigue Low red blood cell level--unusual weakness or fatigue, dizziness, headache, trouble breathing Lung injury--shortness of breath or trouble breathing, cough, spitting up blood, chest pain, fever Stomach pain, bloody diarrhea, pale skin, unusual weakness or fatigue, decrease in the amount of urine,  which may be signs of hemolytic uremic syndrome Sudden and severe headache, confusion, change in vision, seizures, which may be signs of posterior reversible encephalopathy syndrome (PRES) Unusual bruising or bleeding Side effects that usually do not require medical attention (report to your care team if they continue or are bothersome): Diarrhea Drowsiness Hair loss Nausea Pain, redness, or swelling with sores inside the mouth or throat Vomiting This list may not describe all possible side effects. Call your doctor for medical advice about side effects. You may report side effects to FDA at 1-800-FDA-1088. Where should I keep my medication? This medication is given in a hospital or clinic. It will not be stored at home. NOTE: This sheet is a summary. It may not cover all possible information. If you have questions about this medicine, talk to your doctor, pharmacist, or health care provider.  2024 Elsevier/Gold Standard (2022-01-13 00:00:00)

## 2023-07-21 NOTE — Progress Notes (Addendum)
  [facility] OFFICE PROGRESS NOTE   Diagnosis: Ampullary carcinoma  INTERVAL HISTORY:   Mr. Corder returns as scheduled.  He completed cycle 1 day 15 Gemcitabine 07/06/2023.  He completed day 21 capecitabine on 07/13/2023.  He denies nausea/vomiting.  The tenderness at the lower inner gum resolved to 7 days he was off of Xeloda.  No hand or foot pain or redness.  No diarrhea.  No fever.  He notes a dry rash over the forearms and posterior neck.  The rash has improved the week off of Xeloda.  He reports a persistent right foot drop.  He recently began physical therapy.  He gets the AFO brace next week.  Objective:  Vital signs in last 24 hours:  Blood pressure 127/77, pulse 79, temperature 98.2 F (36.8 C), temperature source Temporal, resp. rate 18, height 5\' 11"  (1.803 m), weight 177 lb 12.8 oz (80.6 kg), SpO2 99%.    HEENT: No thrush or ulcers. Resp: Lungs clear bilaterally. Cardio: Regular rate and rhythm. GI: No hepatosplenomegaly. Vascular: No leg edema. Neuro: Persistent right foot drop. Skin: Dry erythematous nodular rash over the forearms and posterior neck.   Lab Results:  Lab Results  Component Value Date   WBC 7.5 07/21/2023   HGB 13.2 07/21/2023   HCT 38.7 (L) 07/21/2023   MCV 90.2 07/21/2023   PLT 225 07/21/2023   NEUTROABS 3.8 07/21/2023    Imaging:  No results found.  Medications: I have reviewed the patient's current medications.  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  pylori gastritis 05/10/2023-we will confirm treatment plan with Dr. 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  L2-L3 mild spinal canal stenosis.  L3-L4 mild to moderate left neural foraminal narrowing.  L5-S1 mild bilateral neural foraminal narrowing.  Narrowing of the left lateral recess at L1-L2.  Disposition: Mr. Seagroves appears stable.  He has completed 1 cycle of gemcitabine/capecitabine.  Aside from skin toxicity he seems to have tolerated well.  He has developed a raised erythematous nodular rash over the forearms and posterior neck.  He understands the rash is likely related to Xeloda.  He will continue to apply a moisturizing lotion.  We discussed the importance of avoiding sun exposure.  The rash is tolerable.  He will contact the office if the rash worsens.  Plan to proceed with cycle 2-day 1 gemcitabine/capecitabine as scheduled.  CBC and chemistry panel reviewed.  Labs adequate to proceed as above.  He will return for follow-up and treatment in 2 weeks.  We are available to see him sooner if needed.  Lonna Cobb ANP/GNP-BC   07/21/2023  1:35 PM

## 2023-07-21 NOTE — Progress Notes (Signed)
Nutrition follow up completed with patient and wife in infusion.  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 Truett Perna.  Weight improved and documented as 177 pounds 12.8 oz from 174 pounds 9.6 oz on Oct 2.  Patient admits to decreased appetite for a few days after treatment. His wife offers many foods patient enjoys, hoping there will be something he will eat. Denies nausea and vomiting. Continues to take Creon. He is drinking ONS and mixes up the variety.  Nutrition Diagnosis: Unintended wt loss improved.  Intervention: Continue small, frequent meals and snacks. Continue ONS. Take medications as prescribed.   Monitoring, Evaluation, Goals: Tolerate adequate calories and protein to minimize wt loss.  Next Visit: Wednesday, Nov 27, during infusion.

## 2023-07-21 NOTE — Progress Notes (Signed)
Patient seen by Lonna Cobb NP today  Vitals are within treatment parameters:Yes   Labs are within treatment parameters: Yes   Treatment plan has been signed: Yes   Per physician team, Patient is ready for treatment and there are NO modifications to the treatment plan.

## 2023-07-22 ENCOUNTER — Other Ambulatory Visit: Payer: Self-pay | Admitting: Nurse Practitioner

## 2023-07-22 DIAGNOSIS — C241 Malignant neoplasm of ampulla of Vater: Secondary | ICD-10-CM

## 2023-07-22 MED ORDER — PANCRELIPASE (LIP-PROT-AMYL) 12000-38000 UNITS PO CPEP: 12000.0000 [IU] | ORAL_CAPSULE | Freq: Three times a day (TID) | ORAL | 1 refills | Status: DC

## 2023-07-28 DIAGNOSIS — B9681 Helicobacter pylori [H. pylori] as the cause of diseases classified elsewhere: Secondary | ICD-10-CM | POA: Diagnosis not present

## 2023-07-28 DIAGNOSIS — K2970 Gastritis, unspecified, without bleeding: Secondary | ICD-10-CM | POA: Diagnosis not present

## 2023-07-31 ENCOUNTER — Other Ambulatory Visit: Payer: Self-pay | Admitting: Oncology

## 2023-08-02 ENCOUNTER — Telehealth: Payer: Self-pay | Admitting: Internal Medicine

## 2023-08-02 ENCOUNTER — Encounter: Payer: Self-pay | Admitting: Internal Medicine

## 2023-08-02 NOTE — Telephone Encounter (Signed)
H pylori is eradicated - stool ag is negative  Had quad tx  PPI Doxy MTZZ Pepto-Bismol

## 2023-08-03 DIAGNOSIS — L408 Other psoriasis: Secondary | ICD-10-CM | POA: Diagnosis not present

## 2023-08-03 DIAGNOSIS — L821 Other seborrheic keratosis: Secondary | ICD-10-CM | POA: Diagnosis not present

## 2023-08-03 DIAGNOSIS — D225 Melanocytic nevi of trunk: Secondary | ICD-10-CM | POA: Diagnosis not present

## 2023-08-03 DIAGNOSIS — L814 Other melanin hyperpigmentation: Secondary | ICD-10-CM | POA: Diagnosis not present

## 2023-08-04 ENCOUNTER — Inpatient Hospital Stay: Payer: Medicare Other

## 2023-08-04 ENCOUNTER — Inpatient Hospital Stay: Payer: Medicare Other | Attending: Oncology

## 2023-08-04 ENCOUNTER — Other Ambulatory Visit: Payer: Self-pay

## 2023-08-04 ENCOUNTER — Encounter: Payer: Self-pay | Admitting: *Deleted

## 2023-08-04 ENCOUNTER — Other Ambulatory Visit: Payer: Self-pay | Admitting: *Deleted

## 2023-08-04 ENCOUNTER — Inpatient Hospital Stay (HOSPITAL_BASED_OUTPATIENT_CLINIC_OR_DEPARTMENT_OTHER): Payer: Medicare Other | Admitting: Oncology

## 2023-08-04 VITALS — BP 131/77 | HR 70 | Temp 98.0°F | Resp 18

## 2023-08-04 VITALS — BP 129/76 | HR 77 | Temp 98.2°F | Resp 18 | Ht 71.0 in | Wt 177.7 lb

## 2023-08-04 DIAGNOSIS — C241 Malignant neoplasm of ampulla of Vater: Secondary | ICD-10-CM

## 2023-08-04 DIAGNOSIS — M48061 Spinal stenosis, lumbar region without neurogenic claudication: Secondary | ICD-10-CM | POA: Insufficient documentation

## 2023-08-04 DIAGNOSIS — Z23 Encounter for immunization: Secondary | ICD-10-CM | POA: Insufficient documentation

## 2023-08-04 DIAGNOSIS — B9681 Helicobacter pylori [H. pylori] as the cause of diseases classified elsewhere: Secondary | ICD-10-CM | POA: Diagnosis not present

## 2023-08-04 DIAGNOSIS — Z79899 Other long term (current) drug therapy: Secondary | ICD-10-CM | POA: Diagnosis not present

## 2023-08-04 DIAGNOSIS — Z5111 Encounter for antineoplastic chemotherapy: Secondary | ICD-10-CM | POA: Diagnosis not present

## 2023-08-04 DIAGNOSIS — M21371 Foot drop, right foot: Secondary | ICD-10-CM | POA: Diagnosis not present

## 2023-08-04 LAB — CBC WITH DIFFERENTIAL (CANCER CENTER ONLY)
Abs Immature Granulocytes: 0.02 10*3/uL (ref 0.00–0.07)
Basophils Absolute: 0 10*3/uL (ref 0.0–0.1)
Basophils Relative: 0 %
Eosinophils Absolute: 0.2 10*3/uL (ref 0.0–0.5)
Eosinophils Relative: 3 %
HCT: 37.6 % — ABNORMAL LOW (ref 39.0–52.0)
Hemoglobin: 12.9 g/dL — ABNORMAL LOW (ref 13.0–17.0)
Immature Granulocytes: 0 %
Lymphocytes Relative: 27 %
Lymphs Abs: 2 10*3/uL (ref 0.7–4.0)
MCH: 31.4 pg (ref 26.0–34.0)
MCHC: 34.3 g/dL (ref 30.0–36.0)
MCV: 91.5 fL (ref 80.0–100.0)
Monocytes Absolute: 0.8 10*3/uL (ref 0.1–1.0)
Monocytes Relative: 11 %
Neutro Abs: 4.3 10*3/uL (ref 1.7–7.7)
Neutrophils Relative %: 59 %
Platelet Count: 201 10*3/uL (ref 150–400)
RBC: 4.11 MIL/uL — ABNORMAL LOW (ref 4.22–5.81)
RDW: 22.4 % — ABNORMAL HIGH (ref 11.5–15.5)
WBC Count: 7.4 10*3/uL (ref 4.0–10.5)
nRBC: 0 % (ref 0.0–0.2)

## 2023-08-04 LAB — CMP (CANCER CENTER ONLY)
ALT: 28 U/L (ref 0–44)
AST: 29 U/L (ref 15–41)
Albumin: 3.6 g/dL (ref 3.5–5.0)
Alkaline Phosphatase: 59 U/L (ref 38–126)
Anion gap: 8 (ref 5–15)
BUN: 8 mg/dL (ref 8–23)
CO2: 27 mmol/L (ref 22–32)
Calcium: 9.3 mg/dL (ref 8.9–10.3)
Chloride: 103 mmol/L (ref 98–111)
Creatinine: 0.77 mg/dL (ref 0.61–1.24)
GFR, Estimated: >60 mL/min (ref >60)
Glucose, Bld: 108 mg/dL — ABNORMAL HIGH (ref 70–99)
Potassium: 3.9 mmol/L (ref 3.5–5.1)
Sodium: 138 mmol/L (ref 135–145)
Total Bilirubin: 0.7 mg/dL (ref <1.2)
Total Protein: 6.9 g/dL (ref 6.5–8.1)

## 2023-08-04 MED ORDER — CAPECITABINE 500 MG PO TABS
1500.0000 mg | ORAL_TABLET | Freq: Two times a day (BID) | ORAL | 0 refills | Status: DC
  Filled 2023-08-04: qty 126, 21d supply, fill #0
  Filled 2023-08-11: qty 126, 28d supply, fill #0

## 2023-08-04 MED ORDER — SODIUM CHLORIDE 0.9% FLUSH
10.0000 mL | INTRAVENOUS | Status: DC | PRN
  Administered 2023-08-04: 10 mL

## 2023-08-04 MED ORDER — HEPARIN SOD (PORK) LOCK FLUSH 100 UNIT/ML IV SOLN
500.0000 [IU] | Freq: Once | INTRAVENOUS | Status: AC | PRN
Start: 2023-08-04 — End: 2023-08-04
  Administered 2023-08-04: 500 [IU]

## 2023-08-04 MED ORDER — INFLUENZA VAC A&B SURF ANT ADJ 0.5 ML IM SUSY
0.5000 mL | PREFILLED_SYRINGE | Freq: Once | INTRAMUSCULAR | Status: AC
  Administered 2023-08-04: 0.5 mL via INTRAMUSCULAR
  Filled 2023-08-04: qty 0.5

## 2023-08-04 MED ORDER — PROCHLORPERAZINE MALEATE 10 MG PO TABS
10.0000 mg | ORAL_TABLET | Freq: Once | ORAL | Status: AC
  Administered 2023-08-04: 10 mg via ORAL
  Filled 2023-08-04: qty 1

## 2023-08-04 MED ORDER — GEMCITABINE HCL CHEMO INJECTION 1 GM/26.3ML
2000.0000 mg | Freq: Once | INTRAVENOUS | Status: AC
  Administered 2023-08-04: 2000 mg via INTRAVENOUS
  Filled 2023-08-04: qty 52.6

## 2023-08-04 MED ORDER — SODIUM CHLORIDE 0.9 % IV SOLN: Freq: Once | INTRAVENOUS | Status: AC

## 2023-08-04 NOTE — Patient Instructions (Signed)
[facility] - A DEPT OF [facility]   Discharge Instructions: Thank you for choosing [facility] to provide your oncology and hematology care.   If you have a lab appointment with the Cancer Center, please go directly to the Cancer Center and check in at the registration area.   Wear comfortable clothing and clothing appropriate for easy access to any Portacath or PICC line.   We strive to give you quality time with your provider. You may need to reschedule your appointment if you arrive late (15 or more minutes).  Arriving late affects you and other patients whose appointments are after yours.  Also, if you miss three or more appointments without notifying the office, you may be dismissed from the clinic at the provider's discretion.      For prescription refill requests, have your pharmacy contact our office and allow 72 hours for refills to be completed.    Today you received the following chemotherapy and/or immunotherapy agents Gemzar      To help prevent nausea and vomiting after your treatment, we encourage you to take your nausea medication as directed.  BELOW ARE SYMPTOMS THAT SHOULD BE REPORTED IMMEDIATELY: *FEVER GREATER THAN 100.4 F (38 C) OR HIGHER *CHILLS OR SWEATING *NAUSEA AND VOMITING THAT IS NOT CONTROLLED WITH YOUR NAUSEA MEDICATION *UNUSUAL SHORTNESS OF BREATH *UNUSUAL BRUISING OR BLEEDING *URINARY PROBLEMS (pain or burning when urinating, or frequent urination) *BOWEL PROBLEMS (unusual diarrhea, constipation, pain near the anus) TENDERNESS IN MOUTH AND THROAT WITH OR WITHOUT PRESENCE OF ULCERS (sore throat, sores in mouth, or a toothache) UNUSUAL RASH, SWELLING OR PAIN  UNUSUAL VAGINAL DISCHARGE OR ITCHING   Items with * indicate a potential emergency and should be followed up as soon as possible or go to the Emergency Department if any problems should occur.  Please show the CHEMOTHERAPY ALERT CARD or IMMUNOTHERAPY  ALERT CARD at check-in to the Emergency Department and triage nurse.  Should you have questions after your visit or need to cancel or reschedule your appointment, please contact [facility] - A DEPT OF [facility]  Dept: [phone]  and follow the prompts.  Office hours are 8:00 a.m. to 4:30 p.m. Monday - Friday. Please note that voicemails left after 4:00 p.m. may not be returned until the following business day.  We are closed weekends and major holidays. You have access to a nurse at all times for urgent questions. Please call the main number to the clinic Dept: [phone] and follow the prompts.   For any non-urgent questions, you may also contact your provider using MyChart. We now offer e-Visits for anyone 67 and older to request care online for non-urgent symptoms. For details visit [email or web address].   Also download the MyChart app! Go to the app store, search "MyChart", open the app, select [place], and log in with your MyChart username and password.  Gemcitabine Injection What is this medication? GEMCITABINE (jem SYE ta been) treats some types of cancer. It works by slowing down the growth of cancer cells. This medicine may be used for other purposes; ask your health care provider or pharmacist if you have questions. COMMON BRAND NAME(S): Gemzar, Infugem What should I tell my care team before I take this medication? They need to know if you have any of these conditions: Blood disorders Infection Kidney disease Liver disease Lung or breathing disease, such as asthma or COPD Recent or ongoing radiation therapy An  unusual or allergic reaction to gemcitabine, other medications, foods, dyes, or preservatives If you or your partner are pregnant or trying to get pregnant Breast-feeding How should I use this medication? This medication is injected into a vein. It is given by your care team in a hospital or clinic setting. Talk to your care  team about the use of this medication in children. Special care may be needed. Overdosage: If you think you have taken too much of this medicine contact a poison control center or emergency room at once. NOTE: This medicine is only for you. Do not share this medicine with others. What if I miss a dose? Keep appointments for follow-up doses. It is important not to miss your dose. Call your care team if you are unable to keep an appointment. What may interact with this medication? Interactions have not been studied. This list may not describe all possible interactions. Give your health care provider a list of all the medicines, herbs, non-prescription drugs, or dietary supplements you use. Also tell them if you smoke, drink alcohol, or use illegal drugs. Some items may interact with your medicine. What should I watch for while using this medication? Your condition will be monitored carefully while you are receiving this medication. This medication may make you feel generally unwell. This is not uncommon, as chemotherapy can affect healthy cells as well as cancer cells. Report any side effects. Continue your course of treatment even though you feel ill unless your care team tells you to stop. In some cases, you may be given additional medications to help with side effects. Follow all directions for their use. This medication may increase your risk of getting an infection. Call your care team for advice if you get a fever, chills, sore throat, or other symptoms of a cold or flu. Do not treat yourself. Try to avoid being around people who are sick. This medication may increase your risk to bruise or bleed. Call your care team if you notice any unusual bleeding. Be careful brushing or flossing your teeth or using a toothpick because you may get an infection or bleed more easily. If you have any dental work done, tell your dentist you are receiving this medication. Avoid taking medications that contain  aspirin, acetaminophen, ibuprofen, naproxen, or ketoprofen unless instructed by your care team. These medications may hide a fever. Talk to your care team if you or your partner wish to become pregnant or think you might be pregnant. This medication can cause serious birth defects if taken during pregnancy and for 6 months after the last dose. A negative pregnancy test is required before starting this medication. A reliable form of contraception is recommended while taking this medication and for 6 months after the last dose. Talk to your care team about effective forms of contraception. Do not father a child while taking this medication and for 3 months after the last dose. Use a condom while having sex during this time period. Do not breastfeed while taking this medication and for at least 1 week after the last dose. This medication may cause infertility. Talk to your care team if you are concerned about your fertility. What side effects may I notice from receiving this medication? Side effects that you should report to your care team as soon as possible: Allergic reactions--skin rash, itching, hives, swelling of the face, lips, tongue, or throat Capillary leak syndrome--stomach or muscle pain, unusual weakness or fatigue, feeling faint or lightheaded, decrease in the amount of  urine, swelling of the ankles, hands, or feet, trouble breathing Infection--fever, chills, cough, sore throat, wounds that don't heal, pain or trouble when passing urine, general feeling of discomfort or being unwell Liver injury--right upper belly pain, loss of appetite, nausea, light-colored stool, dark yellow or brown urine, yellowing skin or eyes, unusual weakness or fatigue Low red blood cell level--unusual weakness or fatigue, dizziness, headache, trouble breathing Lung injury--shortness of breath or trouble breathing, cough, spitting up blood, chest pain, fever Stomach pain, bloody diarrhea, pale skin, unusual weakness or  fatigue, decrease in the amount of urine, which may be signs of hemolytic uremic syndrome Sudden and severe headache, confusion, change in vision, seizures, which may be signs of posterior reversible encephalopathy syndrome (PRES) Unusual bruising or bleeding Side effects that usually do not require medical attention (report to your care team if they continue or are bothersome): Diarrhea Drowsiness Hair loss Nausea Pain, redness, or swelling with sores inside the mouth or throat Vomiting This list may not describe all possible side effects. Call your doctor for medical advice about side effects. You may report side effects to FDA at 1-800-FDA-1088. Where should I keep my medication? This medication is given in a hospital or clinic. It will not be stored at home. NOTE: This sheet is a summary. It may not cover all possible information. If you have questions about this medicine, talk to your doctor, pharmacist, or health care provider.  2024 Elsevier/Gold Standard (2022-01-13 00:00:00)

## 2023-08-04 NOTE — Progress Notes (Signed)
Patient seen by Dr. Thornton Papas today  Vitals are within treatment parameters:Yes   Labs are within treatment parameters: Yes   Treatment plan has been signed: Yes   Per physician team, Patient is ready for treatment and there are NO modifications to the treatment plan. He would like a flu vaccine today as well.

## 2023-08-04 NOTE — Patient Instructions (Signed)

## 2023-08-04 NOTE — Progress Notes (Signed)
[facility] OFFICE PROGRESS NOTE   Diagnosis: Ampullary carcinoma  INTERVAL HISTORY:   Charles Lawson returns as scheduled.  He began another cycle of Xeloda 07/10/2023.  He reports a stable rash over the trunk and arms.  No hand or foot pain.  The right foot drop persist.  He is seeing physical therapy.  He reports a foot brace has been ordered.  No mouth sores or diarrhea.  No nausea.  Objective:  Vital signs in last 24 hours:  Blood pressure 129/76, pulse 77, temperature 98.2 F (36.8 C), temperature source Temporal, resp. rate 18, height 5\' 11"  (1.803 m), weight 177 lb 11.2 oz (80.6 kg), SpO2 99%.    HEENT: No thrush or ulcers Resp: Regular rate and rhythm Cardio: Regular rate and rhythm GI: Nontender, no hepatosplenomegaly Vascular: Trace pitting edemaa at the right foot and ankle Skin: Erythematous maculopapular rash over the chest, back, and arms.  Palms without erythema.  Mild hyperpigmentation of the soles.  Dryness of the lower legs and feet Neurologic: Decree strength with dorsi flexion of the right foot  Portacath/PICC-without erythema  Lab Results:  Lab Results  Component Value Date   WBC 7.4 08/04/2023   HGB 12.9 (L) 08/04/2023   HCT 37.6 (L) 08/04/2023   MCV 91.5 08/04/2023   PLT 201 08/04/2023   NEUTROABS 4.3 08/04/2023    CMP  Lab Results  Component Value Date   NA 136 07/21/2023   K 3.8 07/21/2023   CL 102 07/21/2023   CO2 27 07/21/2023   GLUCOSE 120 (H) 07/21/2023   BUN 9 07/21/2023   CREATININE 0.85 07/21/2023   CALCIUM 9.4 07/21/2023   PROT 6.5 07/21/2023   ALBUMIN 3.9 07/21/2023   AST 26 07/21/2023   ALT 28 07/21/2023   ALKPHOS 71 07/21/2023   BILITOT 0.6 07/21/2023   GFRNONAA >60 07/21/2023   GFRAA  11/19/2008    >60        The eGFR has been calculated using the MDRD equation. This calculation has not been validated in all clinical situations. eGFR's persistently <60 mL/min signify possible Chronic Kidney Disease.     Lab Results  Component Value Date   NFA213 12 06/23/2023     Medications: I have reviewed the patient's current medications.   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 pylori gastritis 05/10/2023-we will confirm treatment plan with Dr. 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  L2-L3 mild spinal canal stenosis.  L3-L4 mild to moderate left neural foraminal narrowing.  L5-S1 mild bilateral neural foraminal narrowing.  Narrowing of the left lateral recess at L1-L2.    Disposition: Charles Lawson completing cycle 2 gemcitabine/capecitabine.  He is tolerating the treatment well aside from a capecitabine skin rash.  The rash appears stable.  The plan is to continue capecitabine at the current dose.  We will dose reduce the capecitabine with cycle 3 if the rash worsens.  He will continue to follow-up with physical therapy for management of the right foot drop.  The foot drop is likely related to peripheral nerve compression.  Right foot swelling  is likely related to lack of movement.  He will elevate the right foot.  Charles Lawson will return for an office visit in 2 weeks.  Charles Papas, MD  08/04/2023  11:17 AM

## 2023-08-05 ENCOUNTER — Ambulatory Visit: Payer: Medicare Other | Attending: Nurse Practitioner

## 2023-08-05 DIAGNOSIS — C241 Malignant neoplasm of ampulla of Vater: Secondary | ICD-10-CM | POA: Insufficient documentation

## 2023-08-05 DIAGNOSIS — R262 Difficulty in walking, not elsewhere classified: Secondary | ICD-10-CM | POA: Insufficient documentation

## 2023-08-05 DIAGNOSIS — M21371 Foot drop, right foot: Secondary | ICD-10-CM | POA: Diagnosis not present

## 2023-08-05 DIAGNOSIS — M6281 Muscle weakness (generalized): Secondary | ICD-10-CM | POA: Insufficient documentation

## 2023-08-05 NOTE — Therapy (Signed)
OUTPATIENT PHYSICAL THERAPY  LOWER EXTREMITY ONCOLOGY TREATMENT  Patient Name: Charles Lawson MRN: [id number] DOB:1941/03/29, 82 y.o., male Today's Date: 08/05/2023  END OF SESSION:  PT End of Session - 08/05/23 1446     Visit Number 2    Number of Visits 12    Date for PT Re-Evaluation 08/31/23    PT Start Time 1455    PT Stop Time 1558    PT Time Calculation (min) 63 min    Activity Tolerance Patient tolerated treatment well    Behavior During Therapy [facility] for tasks assessed/performed             Past Medical History:  Diagnosis Date   Allergic rhinitis, cause unspecified    Arthritis    Blood pressure elevated without history of HTN    BPH (benign prostatic hyperplasia)    Diverticulosis of colon    ED (erectile dysfunction)    Helicobacter pylori gastritis 04/2023   Tx quad tx and eradication documented neg stool ag 07/2023   History of basal cell carcinoma (BCC) excision    Hyperlipidemia    Impaired glucose tolerance 04/03/2013   Personal history of colonic polyps 06/07/2012   Past Surgical History:  Procedure Laterality Date   Quintella Reichert OSTEOTOMY Left 07/19/2020   Procedure: Quintella Reichert OSTEOTOMY;  Surgeon: Edwin Cap, DPM;  Location: [facility] [place];  Service: Podiatry;  Laterality: Left;   BIOPSY  05/10/2023   Procedure: BIOPSY;  Surgeon: Iva Boop, MD;  Location: [facility];  Service: Gastroenterology;;   BIOPSY  05/12/2023   Procedure: BIOPSY;  Surgeon: Iva Boop, MD;  Location: [facility];  Service: Gastroenterology;;   Arbutus Leas Left 07/19/2020   Procedure: Delos Haring;  Surgeon: Edwin Cap, DPM;  Location: [facility];  Service: Podiatry;  Laterality: Left;   COLONOSCOPY  last one 10-06-2017  dr Leone Payor   ESOPHAGOGASTRODUODENOSCOPY N/A 05/12/2023   Procedure: ESOPHAGOGASTRODUODENOSCOPY (EGD);  Surgeon: Iva Boop, MD;  Location: [facility];  Service: Gastroenterology;  Laterality: N/A;    ESOPHAGOGASTRODUODENOSCOPY (EGD) WITH PROPOFOL N/A 05/10/2023   Procedure: ESOPHAGOGASTRODUODENOSCOPY (EGD) WITH PROPOFOL;  Surgeon: Iva Boop, MD;  Location: [facility];  Service: Gastroenterology;  Laterality: N/A;   HAMMER TOE SURGERY Left 07/19/2020   Procedure: HAMMER TOE CORRECTION;  Surgeon: Edwin Cap, DPM;  Location: [facility];  Service: Podiatry;  Laterality: Left;   INGUINAL HERNIA REPAIR Right 11-21-2008  @ [facility]   IR IMAGING GUIDED PORT INSERTION  06/18/2023   LUMBAR LAMINECTOMY  1974   METATARSAL OSTEOTOMY Left 07/19/2020   Procedure: METATARSAL OSTEOTOMY;  Surgeon: Edwin Cap, DPM;  Location: [facility] [place];  Service: Podiatry;  Laterality: Left;   ORIF TOE FRACTURE Left 07/19/2020   Procedure: OPEN TREATMENT OF METARSAL PHALANGEAL JOINT DISLOCATION;  Surgeon: Edwin Cap, DPM;  Location: [facility] [place];  Service: Podiatry;  Laterality: Left;  ANKLE BLOCK WITH THIGH TOURNIQUET   Patient Active Problem List   Diagnosis Date Noted   Depression 07/01/2023   Coronary artery calcification seen on CT scan 07/01/2023   Aortic atherosclerosis (HCC) 07/01/2023   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   Malnutrition of moderate degree 05/14/2023   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   Gastric outlet obstruction 05/10/2023   Duodenitis 05/08/2023   Thrombocytopenia (HCC) 05/08/2023   Epigastric pain 04/30/2023   Asthmatic bronchitis 10/09/2021   Impacted ear wax 10/09/2021   Vitamin D deficiency 06/23/2021   Sinus infection 07/29/2020  Hammertoe of left foot    Tailor's bunion of left foot    Deformity of metatarsal bone of left foot    Injury of plantar plate of left foot    Hallux interphalangeus, acquired, left    Blood pressure elevated without history of HTN 04/02/2020   Ingrown nail 06/16/2019   Acute conjunctivitis of right eye 12/13/2018   Acute pharyngitis 12/13/2018   Dizziness 06/14/2018   Left inguinal hernia  06/11/2017   Mass of left side of neck 06/11/2017   Rash and nonspecific skin eruption 05/25/2014   Impaired glucose tolerance 04/03/2013   Hives 03/10/2013   History of colonic polyps 06/07/2012   Fatigue 03/30/2012   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   Allergic rhinitis 12/29/2011   Arthritis    Hyperlipidemia 01/30/2010   BENIGN PROSTATIC HYPERTROPHY, MILD, HX OF 01/30/2010   Erectile dysfunction 01/26/2008    PCP: Oliver Barre, MD  REFERRING PROVIDER: Lonna Cobb, NP  REFERRING DIAG: Ampullary Cancer  THERAPY DIAG:  Ampullary carcinoma (HCC)  Foot drop, right  Muscle weakness (generalized)  Difficulty walking  ONSET DATE: 06/15/2023  Rationale for Evaluation and Treatment: Rehabilitation  SUBJECTIVE:                                                                                                                                                                                           SUBJECTIVE STATEMENT:  08/05/2023 I expect to get the AFO from the orthotist in a few weeks. I ordered one on [employer] too so I will try it when I get it. I did chemo yesterday.  I don't feel too bad. Its usually a few days later that it bothers me.My Right foot stays a little swollen.  I tried driving last week and  I did fine with it, but I use my left foot to brake.   EVAL Pt reports he has an appt scheduled for an AFO for his right foot but wanted to come to therapy to have his balance and strength checked. He developed Foot drop right after he got out of the hospital.  He is presently unable to drive due to foot drop. He has no appetite and has no energy for about 3 days after his infusion, but otherwise he is doing well. I am doing pretty well I think. 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 Duodenal obstruction August 2024 secondary to #1 H. pylori gastritis 8/19/202 Pt developed Right foot drop. Had lumbar MRI 07/16/2023. He has an appt scheduled for AFO    PAIN:  Are you having pain? No, just numbness in the right lower leg into the foot.  PRECAUTIONS: Fall risk;drop foot  RED FLAGS: None   WEIGHT BEARING RESTRICTIONS: No  FALLS:  Has patient fallen in last 6 months? No  LIVING ENVIRONMENT: Lives with: lives with their spouse Lives in: House/apartment Stairs: Yes; Internal: 13 steps; on left going up and External: 3 steps; none Has following equipment at home: Quad cane large base and Walker - 2 wheeled  OCCUPATION: retired  Human resources officer: enjoys exercising, pool  PRIOR LEVEL OF FUNCTION: Independent  PATIENT GOALS: improve ankle ROM, balance, be able to drive   OBJECTIVE: Note: Objective measures were completed at Evaluation unless otherwise noted.  COGNITION: Overall cognitive status: Within functional limits for tasks assessed   PALPATION: NT  OBSERVATIONS / OTHER ASSESSMENTS:ambulates with steppage gait due to Right foot drop  08/05/2023 PROM DF-10 with knee extended after stretching  SENSATION: Light touch: Deficits , numbness in right    POSTURE: forward head, rounded shoulders  LOWER EXTREMITY STRENGTH:  MMT Right eval  Hip flexion 5  Hip extension Able to bridge  Hip abduction 4+  Hip adduction   Hip internal rotation 4+  Hip external rotation 4+  Knee flexion 5  Knee extension 5  Ankle dorsiflexion trace  Ankle plantarflexion 3+  Ankle inversion 2-  Ankle eversion 1+  Great toe extension 0   (Blank rows = not tested)  MMT LEFT eval  Hip flexion 4+   Hip extension Can bridge  Hip abduction 4+  Hip adduction   Hip internal rotation 4  Hip external rotation 5  Knee flexion 5  Knee extension 4+  Ankle dorsiflexion 4+  Ankle plantarflexion 5  Ankle inversion 4  Ankle eversion 4  Great toe extension     (Blank rows = not tested)  LYMPHEDEMA ASSESSMENTS:   SURGERY TYPE/DATE: 05/17/2023 Pancreatic Duodenectomy  NUMBER OF LYMPH NODES REMOVED: 3 he thinks  CHEMOTHERAPY: Yes currently  RADIATION:NO  HORMONE TREATMENT: NO  INFECTIONS: NO  FUNCTIONAL TESTS:  5 times sit to stand 16.75 sec  4 position balance test; able to maintain all but Right SLS for 10 sec  GAIT: Distance walked: steppage gait due to drop foot on right Assistive device utilized: Quad cane small base Level of assistance: modified independence Comments: Pt needed instruction to use cane in his left UE, and to place it squarly on the floor. Adjusted cane height and base so he could use properly on the left and practiced walking in the gym.    Outcome measure: LE functional Scale:18   TODAY'S TREATMENT:  DATE:   08/05/2023  NuStep Lev 4, seat 10, UE 8 x 8 min, 433 steps PROM right ankle 3 x 30, Ankle DF  PROM = -10 Gastrc stretch with leg supported by table 3 x 30 using strap Foot  placement on step no HH x 10 forward and sideways B Partial step ups 6 in x 10 forward and lateral B with light HH Lunge with foot on step to increase DF Standing marching x 10 B No HH, heel raises x 10 B Step ups on ax x10 B no HH Ax beam 4 lengths forward, 4 lengths sideways with intermittent HH seated rest Sit to stand from chair;no hands x 10 Mini squat light hold on bars x 10 Mini lunges x 10 B with light HH seated rest Updated HEP  07/20/2023 Educated pt in right gastroc stretch with strap and standing runners stretch at  wall. Gave illustrated and written instructions. Also educated pt in proper way to ambulate with quad cane in his left UE with proper cane placement and the need to support Right LE. Also practiced some with SPC. He is getting his AFO next week.    PATIENT EDUCATION:  Access Code: [email or web address] URL: [email or web address] Date: 08/05/2023 Prepared by: Alvira Monday  Exercises - Step Up  - 1 x daily - 7 x weekly - 1 sets - 15 reps - Standing Knee Flexion Stretch on Step  - 1 x daily - 7 x weekly - 1 sets - 5 reps - 15 hold - Mini Squat  - 1 x daily - 7 x weekly - 1 sets - 10 reps - Mini Lunge  - 1 x daily - 7 x weekly - 13 sets - 10 reps Education details: Access Code: [email or web address] URL: [email or web address] Date: 07/20/2023 Prepared by: Alvira Monday Exercises above to be done with light hand hold on railing or counter Exercises - Seated Calf Stretch with Strap  - 2 x daily - 7 x weekly - 1 sets - 3 reps - 30 hold - Standing Gastroc Stretch  - 1 x daily - 7 x weekly - 1 sets - 3 reps - 30 hold Person educated: Patient Education method: Explanation, Demonstration, and Handouts Education comprehension: returned demonstration and needs further education  HOME EXERCISE PROGRAM: Seated gastroc stretch with strap, standing runners stretch  ASSESSMENT:  CLINICAL IMPRESSION: Pt did very well with all exercises with  occasional LOB that he was able to self correct. When asked multiple times did he need to rest or want water he always replied no. He was given several rest breaks and by the end he admitted he was very fatigued. He is walking better with less steppage gait and improved use of cane. Pt advised to be honest when he feels he needs a break.  OBJECTIVE IMPAIRMENTS: Abnormal gait, decreased activity tolerance, decreased balance, decreased knowledge of condition, decreased ROM, impaired sensation, and postural dysfunction.   ACTIVITY LIMITATIONS: carrying,  standing, stairs, and locomotion level  PARTICIPATION LIMITATIONS: driving, shopping, community activity, and yard work  PERSONAL FACTORS: Age and 1-2 comorbidities: Cancer, chemo, foot drop  are also affecting patient's functional outcome.   REHAB POTENTIAL: Good  CLINICAL DECISION MAKING: Stable/uncomplicated  EVALUATION COMPLEXITY: Low   GOALS: Goals reviewed with patient? Yes  SHORT TERM GOALS    Target date: 08/10/2023  Pt will  walk with more normal gait pattern utilizing an AFO Baseline: Goal status: INITIAL   2.  Pt will improve LEFS to atleast 35/80 for improved function  Baseline:  Goal status: INITIAL  3.  Pt will be able to maintain tandem stance with right foot behind for 15 seconds to decrease fall risk Baseline:  Goal status: INITIAL  4.  Pt will be independent with HEP for improved strength, balance and ankle ROM Baseline:  Goal status: INITIAL  5. Pt will be able to resume driving with AFO GOAL status;INITIAL   LONG TERM GOALS: Target date: 08/31/2023  Pt will improve 5 time sit to stand test to no greater than 14.8 seconds to decrease fall risk Baseline:  Goal status: INITIAL  2.  Pt will have LEFS increased to 45/80 for improved function Baseline:  Goal status: INITIAL  3.  Pt will have right ankle PROM maintained at 5-8 degrees for improved function Baseline:  Goal status: INITIAL  4.  Pt will ambulate safely  with near normal gait without AD using AFO Baseline:  Goal status: INITIAL  5.  Pt will be able to go up and down stairs with near normal gait using hand rail Baseline:  Goal status: INITIAL 6.  Pt will have right SLS 10 seconds with AFO  Baseline 5 sec   PLAN:  PT FREQUENCY: 2x/week  PT DURATION: 6 weeks  PLANNED INTERVENTIONS: 56213- PT Re-evaluation, 97110-Therapeutic exercises, 97112- Neuromuscular re-education, 97535- Self Care, [id number]- Manual therapy, [id number]- Gait training, Patient/Family education, Balance training,  Joint mobilization, Therapeutic exercises, Neuromuscular re-education, Gait training, and Self Care, aquatic therapy  PLAN FOR NEXT SESSION: gait training with AD prn, NU step, balance, step ups, review gastroc stretches, generalized strength, Fxl activities   Waynette Buttery, PT 08/05/2023, 5:17 PM

## 2023-08-09 ENCOUNTER — Ambulatory Visit (INDEPENDENT_AMBULATORY_CARE_PROVIDER_SITE_OTHER): Payer: Medicare Other

## 2023-08-09 ENCOUNTER — Ambulatory Visit: Payer: Medicare Other

## 2023-08-09 VITALS — Ht 71.0 in | Wt 177.0 lb

## 2023-08-09 DIAGNOSIS — M6281 Muscle weakness (generalized): Secondary | ICD-10-CM

## 2023-08-09 DIAGNOSIS — Z0000 Encounter for general adult medical examination without abnormal findings: Secondary | ICD-10-CM | POA: Diagnosis not present

## 2023-08-09 DIAGNOSIS — R262 Difficulty in walking, not elsewhere classified: Secondary | ICD-10-CM

## 2023-08-09 DIAGNOSIS — M21371 Foot drop, right foot: Secondary | ICD-10-CM | POA: Diagnosis not present

## 2023-08-09 DIAGNOSIS — C241 Malignant neoplasm of ampulla of Vater: Secondary | ICD-10-CM | POA: Diagnosis not present

## 2023-08-09 NOTE — Progress Notes (Signed)
Subjective:   Charles Lawson is a 82 y.o. male who presents for Medicare Annual/Subsequent preventive examination.  Visit Complete: Virtual I connected with  Jeanine Luz on 08/09/23 by a audio enabled telemedicine application and verified that I am speaking with the correct person using two identifiers.  Patient Location: Home  Provider Location: Office/Clinic  I discussed the limitations of evaluation and management by telemedicine. The patient expressed understanding and agreed to proceed.  Vital Signs: Because this visit was a virtual/telehealth visit, some criteria may be missing or patient reported. Any vitals not documented were not able to be obtained and vitals that have been documented are patient reported.   Cardiac Risk Factors include: male gender;advanced age (>67men, >1 women)     Objective:    Today's Vitals   08/09/23 1335  Weight: 177 lb (80.3 kg)  Height: 5\' 11"  (1.803 m)   Body mass index is 24.69 kg/m.     08/09/2023    1:49 PM 08/04/2023   11:03 AM 07/21/2023    1:18 PM 07/20/2023   11:04 AM 07/06/2023   11:21 AM 06/23/2023    1:10 PM 06/18/2023   12:40 PM  Advanced Directives  Does Patient Have a Medical Advance Directive? Yes Yes No No No No Yes  Type of Estate agent of [place];Living will Healthcare Power of [place];Living will Living will;Healthcare Power of Attorney   Living will;Healthcare Power of Attorney   Does patient want to make changes to medical advance directive?  No - Patient declined No - Patient declined No - Patient declined No - Patient declined No - Patient declined No - Patient declined  Copy of Healthcare Power of Attorney in Chart? No - copy requested No - copy requested       Would patient like information on creating a medical advance directive?   No - Patient declined No - Patient declined  No - Patient declined     Current Medications (verified) Outpatient Encounter Medications as of 08/09/2023   Medication Sig   acetaminophen (TYLENOL) 650 MG CR tablet Take 500 mg by mouth every 8 (eight) hours as needed for pain. 05/27/23: reports during [facility] call taking after recent surgery at [facility]   capecitabine (XELODA) 500 MG tablet Take 3 tablets (1,500 mg total) by mouth 2 (two) times daily after a meal. Take for 21 days, then hold for 7 days. Repeat every 28 days. Start cycle on 08/20/23   lidocaine-prilocaine (EMLA) cream Apply 1 Application topically as needed.   lipase/protease/amylase (CREON) 12000-38000 units CPEP capsule Take 1 capsule (12,000 Units total) by mouth 3 (three) times daily before meals.   omeprazole (PRILOSEC OTC) 20 MG tablet Take 20 mg by mouth daily. 05/27/23: reports during [facility] call taking after recent surgery at [facility]   ondansetron (ZOFRAN) 4 MG tablet Take 1 tablet (4 mg total) by mouth every 8 (eight) hours as needed for nausea or vomiting.   prochlorperazine (COMPAZINE) 10 MG tablet Take 1 tablet (10 mg total) by mouth every 6 (six) hours as needed for nausea or vomiting.   tamsulosin (FLOMAX) 0.4 MG CAPS capsule Take 0.4 mg by mouth daily. 05/27/23: reports during [facility] call taking after recent surgery at [facility]   No facility-administered encounter medications on file as of 08/09/2023.    Allergies (verified) Penicillins   History: Past Medical History:  Diagnosis Date   Allergic rhinitis, cause unspecified    Arthritis    Blood pressure elevated without history of HTN  BPH (benign prostatic hyperplasia)    Diverticulosis of colon    ED (erectile dysfunction)    Helicobacter pylori gastritis 04/2023   Tx quad tx and eradication documented neg stool ag 07/2023   History of basal cell carcinoma (BCC) excision    Hyperlipidemia    Impaired glucose tolerance 04/03/2013   Personal history of colonic polyps 06/07/2012   Past Surgical History:  Procedure Laterality Date   Quintella Reichert OSTEOTOMY Left 07/19/2020   Procedure: Quintella Reichert OSTEOTOMY;  Surgeon: Edwin Cap,  DPM;  [facility] [place];  Service: Podiatry;  Laterality: Left;   BIOPSY  05/10/2023   Procedure: BIOPSY;  Surgeon: Iva Boop, MD;  Location: [facility];  Service: Gastroenterology;;   BIOPSY  05/12/2023   Procedure: BIOPSY;  Surgeon: Iva Boop, MD;  Location: [facility];  Service: Gastroenterology;;   Arbutus Leas Left 07/19/2020   Procedure: Delos Haring;  Surgeon: Edwin Cap, DPM;  Location: [facility];  Service: Podiatry;  Laterality: Left;   COLONOSCOPY  last one 10-06-2017  dr Leone Payor   ESOPHAGOGASTRODUODENOSCOPY N/A 05/12/2023   Procedure: ESOPHAGOGASTRODUODENOSCOPY (EGD);  Surgeon: Iva Boop, MD;  Location: [facility];  Service: Gastroenterology;  Laterality: N/A;   ESOPHAGOGASTRODUODENOSCOPY (EGD) WITH PROPOFOL N/A 05/10/2023   Procedure: ESOPHAGOGASTRODUODENOSCOPY (EGD) WITH PROPOFOL;  Surgeon: Iva Boop, MD;  Location: [facility];  Service: Gastroenterology;  Laterality: N/A;   HAMMER TOE SURGERY Left 07/19/2020   Procedure: HAMMER TOE CORRECTION;  Surgeon: Edwin Cap, DPM;  Location: [facility] [place];  Service: Podiatry;  Laterality: Left;   INGUINAL HERNIA REPAIR Right 11-21-2008  @ [facility]   IR IMAGING GUIDED PORT INSERTION  06/18/2023   LUMBAR LAMINECTOMY  1974   METATARSAL OSTEOTOMY Left 07/19/2020   Procedure: METATARSAL OSTEOTOMY;  Surgeon: Edwin Cap, DPM;  Location: [facility] [place];  Service: Podiatry;  Laterality: Left;   ORIF TOE FRACTURE Left 07/19/2020   Procedure: OPEN TREATMENT OF METARSAL PHALANGEAL JOINT DISLOCATION;  Surgeon: Edwin Cap, DPM;  Location: [facility] [place];  Service: Podiatry;  Laterality: Left;  ANKLE BLOCK WITH THIGH TOURNIQUET   Family History  Problem Relation Age of Onset   COPD Mother    Cancer Father    Lung cancer Father    Colon cancer Neg Hx    Colon polyps Neg Hx    Rectal cancer Neg Hx    Stomach cancer Neg Hx     Social History   Socioeconomic History   Marital status: Married    Spouse name: Not on file   Number of children: 1   Years of education: Not on file   Highest education level: Not on file  Occupational History   Occupation: Retired  Tobacco Use   Smoking status: Never   Smokeless tobacco: Never  Vaping Use   Vaping status: Never Used  Substance and Sexual Activity   Alcohol use: Yes    Comment: occaional   Drug use: Never   Sexual activity: Yes  Other Topics Concern   Not on file  Social History Narrative   Not on file   Social Determinants of Health   Financial Resource Strain: Low Risk  (08/09/2023)   Overall Financial Resource Strain (CARDIA)    Difficulty of Paying Living Expenses: Not hard at all  Food Insecurity: No Food Insecurity (08/09/2023)   Hunger Vital Sign    Worried About Running Out of Food in the Last Year: Never true  Ran Out of Food in the Last Year: Never true  Transportation Needs: No Transportation Needs (08/09/2023)   PRAPARE - Administrator, Civil Service (Medical): No    Lack of Transportation (Non-Medical): No  Physical Activity: Inactive (08/11/2022)   Exercise Vital Sign    Days of Exercise per Week: 0 days    Minutes of Exercise per Session: 0 min  Stress: No Stress Concern Present (08/09/2023)   [employer] of Occupational Health - Occupational Stress Questionnaire    Feeling of Stress : Not at all  Social Connections: Moderately Integrated (08/09/2023)   Social Connection and Isolation Panel [NHANES]    Frequency of Communication with Friends and Family: More than three times a week    Frequency of Social Gatherings with Friends and Family: Twice a week    Attends Religious Services: More than 4 times per year    Active Member of [employer] or Organizations: No    Attends Engineer, structural: Never    Marital Status: Married    Tobacco Counseling Counseling given: Not Answered   Clinical  Intake:  Pre-visit preparation completed: Yes  Pain : No/denies pain     BMI - recorded: 24.69 Nutritional Status: BMI of 19-24  Normal Nutritional Risks: None Diabetes: No  How often do you need to have someone help you when you read instructions, pamphlets, or other written materials from your doctor or pharmacy?: 1 - Never  Interpreter Needed?: No  Information entered by :: Cecia Egge, RMA   Activities of Daily Living    08/09/2023    1:47 PM 06/18/2023   12:38 PM  In your present state of health, do you have any difficulty performing the following activities:  Hearing? 0 1  Vision? 0 1  Difficulty concentrating or making decisions? 0 1  Walking or climbing stairs? 0   Dressing or bathing? 0   Doing errands, shopping? 0   Preparing Food and eating ? N   Using the Toilet? N   In the past six months, have you accidently leaked urine? N   Do you have problems with loss of bowel control? N   Managing your Medications? N   Managing your Finances? N   Housekeeping or managing your Housekeeping? N     Patient Care Team: Corwin Levins, MD as PCP - General (Internal Medicine) Carman Ching, OD as Consulting Physician (Optometry) Jerrell Mylar, Jacquelyne Balint, RN as Oncology Nurse Navigator Truett Perna Leighton Roach, MD as Consulting Physician (Oncology)  Indicate any recent Medical Services you may have received from other than [facility] providers in the past year (date may be approximate).     Assessment:   This is a routine wellness examination for [place].  Hearing/Vision screen Hearing Screening - Comments:: Denies hearing difficulties   Vision Screening - Comments:: Denies vision issues   Goals Addressed   None   Depression Screen    08/09/2023    1:53 PM 06/29/2023    9:38 AM 06/02/2023    1:32 PM 04/30/2023    3:18 PM 08/11/2022    2:10 PM 06/24/2022   10:09 AM 11/06/2021   10:20 AM  PHQ 2/9 Scores  PHQ - 2 Score 0 0 4 0 0  0  PHQ- 9 Score 0 0 7 0     Exception  Documentation      Patient refusal     Fall Risk    08/09/2023    1:50 PM 06/29/2023    9:38  AM 04/30/2023    3:18 PM 08/11/2022    2:04 PM 06/24/2022   10:09 AM  Fall Risk   Falls in the past year? 0 0 0 0 0  Number falls in past yr: 0 0 0 0   Injury with Fall? 0 0 0 0   Risk for fall due to : No Fall Risks No Fall Risks  No Fall Risks   Follow up Falls prevention discussed;Falls evaluation completed Falls evaluation completed Falls evaluation completed Falls prevention discussed     MEDICARE RISK AT HOME: Medicare Risk at Home Any stairs in or around the home?: Yes If so, are there any without handrails?: Yes Home free of loose throw rugs in walkways, pet beds, electrical cords, etc?: Yes Adequate lighting in your home to reduce risk of falls?: Yes Life alert?: No Use of a cane, walker or w/c?: Yes (uses a cane) Grab bars in the bathroom?: Yes Shower chair or bench in shower?: Yes  TIMED UP AND GO:  Was the test performed?  No    Cognitive Function:        08/09/2023    1:50 PM 08/11/2022    2:11 PM  6CIT Screen  What Year? 0 points 0 points  What month? 0 points 0 points  What time? 0 points 0 points  Count back from 20 0 points 0 points  Months in reverse 0 points 0 points  Repeat phrase 0 points 0 points  Total Score 0 points 0 points    Immunizations Immunization History  Administered Date(s) Administered   Fluad Quad(high Dose 65+) 06/16/2019, 06/20/2020, 06/23/2021, 06/24/2022   Fluad Trivalent(High Dose 65+) 08/04/2023   Influenza, High Dose Seasonal PF 05/28/2015, 06/11/2017, 06/14/2018   Influenza,inj,Quad PF,6+ Mos 05/25/2014   PFIZER(Purple Top)SARS-COV-2 Vaccination 10/27/2019, 11/21/2019, 08/26/2020   Pneumococcal Conjugate-13 06/08/2014   Pneumococcal Polysaccharide-23 01/30/2010   Td 12/21/2006   Tdap 06/11/2017   Zoster Recombinant(Shingrix) 07/15/2022, 10/01/2022   Zoster, Live 08/27/2009    TDAP status: Up to date  Flu Vaccine  status: Up to date  Pneumococcal vaccine status: Up to date  Covid-19 vaccine status: Completed vaccines  Qualifies for Shingles Vaccine? Yes   Zostavax completed Yes   Shingrix Completed?: Yes  Screening Tests Health Maintenance  Topic Date Due   COVID-19 Vaccine (4 - 2023-24 season) 08/25/2023 (Originally 05/23/2023)   Colonoscopy  06/28/2024 (Originally 10/06/2022)   Medicare Annual Wellness (AWV)  08/08/2024   DTaP/Tdap/Td (3 - Td or Tdap) 06/12/2027   Pneumonia Vaccine 56+ Years old  Completed   INFLUENZA VACCINE  Completed   Zoster Vaccines- Shingrix  Completed   HPV VACCINES  Aged Out   Hepatitis C Screening  Discontinued    Health Maintenance  There are no preventive care reminders to display for this patient.   Colorectal cancer screening: No longer required.   Lung Cancer Screening: (Low Dose CT Chest recommended if Age 62-80 years, 20 pack-year currently smoking OR have quit w/in 15years.) does not qualify.   Lung Cancer Screening Referral: N/A  Additional Screening:  Hepatitis C Screening: does qualify; Completed 04/02/2020  Vision Screening: Recommended annual ophthalmology exams for early detection of glaucoma and other disorders of the eye. Is the patient up to date with their annual eye exam?  Yes  Who is the provider or what is the name of the office in which the patient attends annual eye exams? [facility] If pt is not established with a provider, would they like to be  referred to a provider to establish care? No .   Dental Screening: Recommended annual dental exams for proper oral hygiene   Community Resource Referral / Chronic Care Management: CRR required this visit?  No   CCM required this visit?  No     Plan:     I have personally reviewed and noted the following in the patient's chart:   Medical and social history Use of alcohol, tobacco or illicit drugs  Current medications and supplements including opioid prescriptions. Patient is  not currently taking opioid prescriptions. Functional ability and status Nutritional status Physical activity Advanced directives List of other physicians Hospitalizations, surgeries, and ER visits in previous 12 months Vitals Screenings to include cognitive, depression, and falls Referrals and appointments  In addition, I have reviewed and discussed with patient certain preventive protocols, quality metrics, and best practice recommendations. A written personalized care plan for preventive services as well as general preventive health recommendations were provided to patient.     Josafat Enrico L Javier Mamone, CMA   08/09/2023   After Visit Summary: (MyChart) Due to this being a telephonic visit, the after visit summary with patients personalized plan was offered to patient via MyChart   Nurse Notes: Patient is up to date on all health maintenance.. He has no concerns to address today.

## 2023-08-09 NOTE — Therapy (Signed)
OUTPATIENT PHYSICAL THERAPY  LOWER EXTREMITY ONCOLOGY TREATMENT  Patient Name: Charles Lawson MRN: [id number] DOB:03-08-1941, 82 y.o., male Today's Date: 08/09/2023  END OF SESSION:  PT End of Session - 08/09/23 1003     Visit Number 3    Number of Visits 12    Date for PT Re-Evaluation 08/31/23    PT Start Time 1005    PT Stop Time 1055    PT Time Calculation (min) 50 min    Activity Tolerance Patient tolerated treatment well    Behavior During Therapy [facility] for tasks assessed/performed             Past Medical History:  Diagnosis Date   Allergic rhinitis, cause unspecified    Arthritis    Blood pressure elevated without history of HTN    BPH (benign prostatic hyperplasia)    Diverticulosis of colon    ED (erectile dysfunction)    Helicobacter pylori gastritis 04/2023   Tx quad tx and eradication documented neg stool ag 07/2023   History of basal cell carcinoma (BCC) excision    Hyperlipidemia    Impaired glucose tolerance 04/03/2013   Personal history of colonic polyps 06/07/2012   Past Surgical History:  Procedure Laterality Date   Quintella Reichert OSTEOTOMY Left 07/19/2020   Procedure: Quintella Reichert OSTEOTOMY;  Surgeon: Edwin Cap, DPM;  Location: [facility];  Service: Podiatry;  Laterality: Left;   BIOPSY  05/10/2023   Procedure: BIOPSY;  Surgeon: Iva Boop, MD;  Location: [facility];  Service: Gastroenterology;;   BIOPSY  05/12/2023   Procedure: BIOPSY;  Surgeon: Iva Boop, MD;  Location: [facility];  Service: Gastroenterology;;   Arbutus Leas Left 07/19/2020   Procedure: Delos Haring;  Surgeon: Edwin Cap, DPM;  Location: [facility];  Service: Podiatry;  Laterality: Left;   COLONOSCOPY  last one 10-06-2017  dr Leone Payor   ESOPHAGOGASTRODUODENOSCOPY N/A 05/12/2023   Procedure: ESOPHAGOGASTRODUODENOSCOPY (EGD);  Surgeon: Iva Boop, MD;  Location: [facility];  Service: Gastroenterology;  Laterality: N/A;    ESOPHAGOGASTRODUODENOSCOPY (EGD) WITH PROPOFOL N/A 05/10/2023   Procedure: ESOPHAGOGASTRODUODENOSCOPY (EGD) WITH PROPOFOL;  Surgeon: Iva Boop, MD;  Location: [facility];  Service: Gastroenterology;  Laterality: N/A;   HAMMER TOE SURGERY Left 07/19/2020   Procedure: HAMMER TOE CORRECTION;  Surgeon: Edwin Cap, DPM;  Location: [facility];  Service: Podiatry;  Laterality: Left;   INGUINAL HERNIA REPAIR Right 11-21-2008  @ [facility]   IR IMAGING GUIDED PORT INSERTION  06/18/2023   LUMBAR LAMINECTOMY  1974   METATARSAL OSTEOTOMY Left 07/19/2020   Procedure: METATARSAL OSTEOTOMY;  Surgeon: Edwin Cap, DPM;  Location: [facility];  Service: Podiatry;  Laterality: Left;   ORIF TOE FRACTURE Left 07/19/2020   Procedure: OPEN TREATMENT OF METARSAL PHALANGEAL JOINT DISLOCATION;  Surgeon: Edwin Cap, DPM;  Location: [facility] [place];  Service: Podiatry;  Laterality: Left;  ANKLE BLOCK WITH THIGH TOURNIQUET   Patient Active Problem List   Diagnosis Date Noted   Depression 07/01/2023   Coronary artery calcification seen on CT scan 07/01/2023   Aortic atherosclerosis (HCC) 07/01/2023   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   Malnutrition of moderate degree 05/14/2023   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   Gastric outlet obstruction 05/10/2023   Duodenitis 05/08/2023   Thrombocytopenia (HCC) 05/08/2023   Epigastric pain 04/30/2023   Asthmatic bronchitis 10/09/2021   Impacted ear wax 10/09/2021   Vitamin D deficiency 06/23/2021   Sinus infection 07/29/2020  Hammertoe of left foot    Tailor's bunion of left foot    Deformity of metatarsal bone of left foot    Injury of plantar plate of left foot    Hallux interphalangeus, acquired, left    Blood pressure elevated without history of HTN 04/02/2020   Ingrown nail 06/16/2019   Acute conjunctivitis of right eye 12/13/2018   Acute pharyngitis 12/13/2018   Dizziness 06/14/2018   Left inguinal hernia  06/11/2017   Mass of left side of neck 06/11/2017   Rash and nonspecific skin eruption 05/25/2014   Impaired glucose tolerance 04/03/2013   Hives 03/10/2013   History of colonic polyps 06/07/2012   Fatigue 03/30/2012   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   Allergic rhinitis 12/29/2011   Arthritis    Hyperlipidemia 01/30/2010   BENIGN PROSTATIC HYPERTROPHY, MILD, HX OF 01/30/2010   Erectile dysfunction 01/26/2008    PCP: Oliver Barre, MD  REFERRING PROVIDER: Lonna Cobb, NP  REFERRING DIAG: Ampullary Cancer  THERAPY DIAG:  Ampullary carcinoma (HCC)  Foot drop, right  Muscle weakness (generalized)  Difficulty walking  ONSET DATE: 06/15/2023  Rationale for Evaluation and Treatment: Rehabilitation  SUBJECTIVE:                                                                                                                                                                                           SUBJECTIVE STATEMENT:  08/09/2023 I got the AFO from [place] and it helps. I was able to walk without my Medical laboratory scientific officer for church. I was very tired after last visit and I went home and sat down for awhile.I lost my appetite on Saturday from the chemo, but I am better today  EVAL Pt reports he has an appt scheduled for an AFO for his right foot but wanted to come to therapy to have his balance and strength checked. He developed Foot drop right after he got out of the hospital.  He is presently unable to drive due to foot drop. He has no appetite and has no energy for about 3 days after his infusion, but otherwise he is doing well. I am doing pretty well I think. 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 Duodenal obstruction August 2024 secondary to #1 H. pylori gastritis 8/19/202 Pt developed  Right foot drop. Had lumbar MRI 07/16/2023. He has an appt scheduled for AFO    PAIN:  Are you having pain? No, just numbness in the right lower leg into the foot.  PRECAUTIONS: Fall risk;drop foot  RED FLAGS: None   WEIGHT BEARING RESTRICTIONS: No  FALLS:  Has patient fallen in last 6 months? No  LIVING ENVIRONMENT: Lives with: lives with their spouse Lives in: House/apartment Stairs: Yes; Internal: 13 steps; on left going up and External: 3 steps; none Has following equipment at home: Quad cane large base and Walker - 2 wheeled  OCCUPATION: retired  Human resources officer: enjoys exercising, pool  PRIOR LEVEL OF FUNCTION: Independent  PATIENT GOALS: improve ankle ROM, balance, be able to drive   OBJECTIVE: Note: Objective measures were completed at Evaluation unless otherwise noted.  COGNITION: Overall cognitive status: Within functional limits for tasks assessed   PALPATION: NT  OBSERVATIONS / OTHER ASSESSMENTS:ambulates with steppage gait due to Right foot drop  08/05/2023 PROM DF-10 with knee extended after stretching  SENSATION: Light touch: Deficits , numbness in right    POSTURE: forward head, rounded shoulders  LOWER EXTREMITY STRENGTH:  MMT Right eval  Hip flexion 5  Hip extension Able to bridge  Hip abduction 4+  Hip adduction   Hip internal rotation 4+  Hip external rotation 4+  Knee flexion 5  Knee extension 5  Ankle dorsiflexion trace  Ankle plantarflexion 3+  Ankle inversion 2-  Ankle eversion 1+  Great toe extension 0   (Blank rows = not tested)  MMT LEFT eval  Hip flexion 4+  Hip extension Can bridge  Hip abduction 4+  Hip adduction   Hip internal rotation 4  Hip external rotation 5   Knee flexion 5  Knee extension 4+  Ankle dorsiflexion 4+  Ankle plantarflexion 5  Ankle inversion 4  Ankle eversion 4  Great toe extension     (Blank rows = not tested)  LYMPHEDEMA ASSESSMENTS:   SURGERY TYPE/DATE: 05/17/2023 Pancreatic Duodenectomy  NUMBER OF LYMPH NODES REMOVED: 3 he thinks  CHEMOTHERAPY: Yes currently  RADIATION:NO  HORMONE TREATMENT: NO  INFECTIONS: NO  FUNCTIONAL TESTS:  5 times sit to stand 16.75 sec  4 position balance test; able to maintain all but Right SLS for 10 sec  GAIT: Distance walked: steppage gait due to drop foot on right Assistive device utilized: Quad cane small base Level of assistance: modified independence Comments: Pt needed instruction to use cane in his left UE, and to place it squarly on the floor. Adjusted cane height and base so he could use properly on the left and practiced walking in the gym.    Outcome measure: LE functional Scale:18   TODAY'S TREATMENT:  DATE:   08/09/2023 NuSTep Lev 4, seat 10, UE 8, x 11 in  642 steps Mini squats x 15 with HH Mini lunges x 10 ea side with HH Marching in place x 15 no HH Sidestepping 3 lengths in bars, rest and H20, no HH Cable machine; scapular retraction 7# 2 x 10, biceps curls 7 # 2 x 10 Sit to stand  without UE'sx 10 with emphasis on posture and slowly lowering Ambulated without cane cancer gym to ortho gym down and back 2x's without rest with emphasis on posture and head up Showed pt sitting heel taps to try at home to try and get some DF return   08/05/2023 NuStep Lev 4, seat 10, UE 8 x 8 min, 433 steps PROM right ankle 3 x 30, Ankle DF  PROM = -10 Gastrc stretch with leg supported by table 3 x 30 using strap Foot  placement on step no HH x 10 forward and sideways B Partial step ups 6 in x 10 forward and lateral B with light  HH Lunge with foot on step to increase DF Standing marching x 10 B No HH, heel raises x 10 B Step ups on ax x10 B no HH Ax beam 4 lengths forward, 4 lengths sideways with intermittent HH seated rest Sit to stand from chair;no hands x 10 Mini squat light hold on bars x 10 Mini lunges x 10 B with light HH seated rest Updated HEP  07/20/2023 Educated pt in right gastroc stretch with strap and standing runners stretch at wall. Gave illustrated and written instructions. Also educated pt in proper way to ambulate with quad cane in his left UE with proper cane placement and the need to support Right LE. Also practiced some with SPC. He is getting his AFO next week.    PATIENT EDUCATION:  Access Code: [email or web address] URL: [email or web address] Date: 08/05/2023 Prepared by: Alvira Monday  Exercises - Step Up  - 1 x daily - 7 x weekly - 1 sets - 15 reps - Standing Knee Flexion Stretch on Step  - 1 x daily - 7 x weekly - 1 sets - 5 reps - 15 hold - Mini Squat  - 1 x daily - 7 x weekly - 1 sets - 10 reps - Mini Lunge  - 1 x daily - 7 x weekly - 13 sets - 10 reps Education details: Access Code: [email or web address] URL: [email or web address] Date: 07/20/2023 Prepared by: Alvira Monday Exercises above to be done with light hand hold on railing or counter Exercises - Seated Calf Stretch with Strap  - 2 x daily - 7 x weekly - 1 sets - 3 reps - 30 hold - Standing Gastroc Stretch  - 1 x daily - 7 x weekly - 1 sets - 3 reps - 30 hold Person educated: Patient Education method: Explanation, Demonstration, and Handouts Education comprehension: returned demonstration and needs further education  HOME EXERCISE PROGRAM: Seated gastroc stretch with strap, standing runners stretch  ASSESSMENT:  CLINICAL IMPRESSION: Pt did very well with all exercises. He does still ambulate with right foot drop but without steppage gait. He was able to ambulate without his cane today with no LOB. His legs  fatigued, but he was not worn out.  OBJECTIVE IMPAIRMENTS: Abnormal gait, decreased activity tolerance, decreased balance, decreased knowledge of condition, decreased ROM, impaired sensation, and postural dysfunction.   ACTIVITY LIMITATIONS: carrying, standing, stairs, and locomotion level  PARTICIPATION LIMITATIONS: driving, shopping, community activity, and yard work  PERSONAL FACTORS: Age  and 1-2 comorbidities: Cancer, chemo, foot drop  are also affecting patient's functional outcome.   REHAB POTENTIAL: Good  CLINICAL DECISION MAKING: Stable/uncomplicated  EVALUATION COMPLEXITY: Low   GOALS: Goals reviewed with patient? Yes  SHORT TERM GOALS    Target date: 08/10/2023  Pt will  walk with more normal gait pattern utilizing an AFO Baseline: Goal status: INITIAL   2.  Pt will improve LEFS to atleast 35/80 for improved function Baseline:  Goal status: INITIAL  3.  Pt will be able to maintain tandem stance with right foot behind for 15 seconds to decrease fall risk Baseline:  Goal status: INITIAL  4.  Pt will be independent with HEP for improved strength, balance and ankle ROM Baseline:  Goal status: INITIAL  5. Pt will be able to resume driving with AFO GOAL status;INITIAL   LONG TERM GOALS: Target date: 08/31/2023  Pt will improve 5 time sit to stand test to no greater than 14.8 seconds to decrease fall risk Baseline:  Goal status: INITIAL  2.  Pt will have LEFS increased to 45/80 for improved function Baseline:  Goal status: INITIAL  3.  Pt will have right ankle PROM maintained at 5-8 degrees for improved function Baseline:  Goal status: INITIAL  4.  Pt will ambulate safely  with near normal gait without AD using AFO Baseline:  Goal status: INITIAL  5.  Pt will be able to go up and down stairs with near normal gait using hand rail Baseline:  Goal status: INITIAL 6.  Pt will have right SLS 10 seconds with AFO  Baseline 5 sec   PLAN:  PT FREQUENCY:  2x/week  PT DURATION: 6 weeks  PLANNED INTERVENTIONS: 16109- PT Re-evaluation, 97110-Therapeutic exercises, 97112- Neuromuscular re-education, 97535- Self Care, 60454- Manual therapy, [phone]- Gait training, Patient/Family education, Balance training, Joint mobilization, Therapeutic exercises, Neuromuscular re-education, Gait training, and Self Care, aquatic therapy  PLAN FOR NEXT SESSION: gait training with AD prn, NU step, balance, step ups, review gastroc stretches, generalized strength, Fxl activities   Waynette Buttery, PT 08/09/2023, 10:58 AM

## 2023-08-09 NOTE — Patient Instructions (Signed)
Charles Lawson , Thank you for taking time to come for your Medicare Wellness Visit. I appreciate your ongoing commitment to your health goals. Please review the following plan we discussed and let me know if I can assist you in the future.   Referrals/Orders/Follow-Ups/Clinician Recommendations: You are doing well.  Keep up the good work.  This is a list of the screening recommended for you and due dates:  Health Maintenance  Topic Date Due   COVID-19 Vaccine (4 - 2023-24 season) 08/25/2023*   Colon Cancer Screening  06/28/2024*   Medicare Annual Wellness Visit  08/08/2024   DTaP/Tdap/Td vaccine (3 - Td or Tdap) 06/12/2027   Pneumonia Vaccine  Completed   Flu Shot  Completed   Zoster (Shingles) Vaccine  Completed   HPV Vaccine  Aged Out   Hepatitis C Screening  Discontinued  *Topic was postponed. The date shown is not the original due date.    Advanced directives: (Copy Requested) Please bring a copy of your health care power of attorney and living will to the office to be added to your chart at your convenience.  Next Medicare Annual Wellness Visit scheduled for next year: Yes

## 2023-08-11 ENCOUNTER — Encounter: Payer: Self-pay | Admitting: Oncology

## 2023-08-11 ENCOUNTER — Other Ambulatory Visit: Payer: Self-pay

## 2023-08-11 ENCOUNTER — Ambulatory Visit: Payer: Medicare Other

## 2023-08-11 DIAGNOSIS — C241 Malignant neoplasm of ampulla of Vater: Secondary | ICD-10-CM

## 2023-08-11 DIAGNOSIS — M6281 Muscle weakness (generalized): Secondary | ICD-10-CM | POA: Diagnosis not present

## 2023-08-11 DIAGNOSIS — R262 Difficulty in walking, not elsewhere classified: Secondary | ICD-10-CM | POA: Diagnosis not present

## 2023-08-11 DIAGNOSIS — M21371 Foot drop, right foot: Secondary | ICD-10-CM | POA: Diagnosis not present

## 2023-08-11 NOTE — Telephone Encounter (Signed)
Telephone call  

## 2023-08-11 NOTE — Therapy (Signed)
OUTPATIENT PHYSICAL THERAPY  LOWER EXTREMITY ONCOLOGY TREATMENT  Patient Name: Charles Lawson MRN: [id number] DOB:Jun 21, 1941, 82 y.o., male Today's Date: 08/11/2023  END OF SESSION:  PT End of Session - 08/11/23 1002     Visit Number 4    Number of Visits 12    Date for PT Re-Evaluation 08/31/23    PT Start Time 1003    PT Stop Time 1043    PT Time Calculation (min) 40 min    Activity Tolerance Patient tolerated treatment well    Behavior During Therapy [facility] for tasks assessed/performed             Past Medical History:  Diagnosis Date   Allergic rhinitis, cause unspecified    Arthritis    Blood pressure elevated without history of HTN    BPH (benign prostatic hyperplasia)    Diverticulosis of colon    ED (erectile dysfunction)    Helicobacter pylori gastritis 04/2023   Tx quad tx and eradication documented neg stool ag 07/2023   History of basal cell carcinoma (BCC) excision    Hyperlipidemia    Impaired glucose tolerance 04/03/2013   Personal history of colonic polyps 06/07/2012   Past Surgical History:  Procedure Laterality Date   Quintella Reichert OSTEOTOMY Left 07/19/2020   Procedure: Quintella Reichert OSTEOTOMY;  Surgeon: Edwin Cap, DPM;  Location: [facility];  Service: Podiatry;  Laterality: Left;   BIOPSY  05/10/2023   Procedure: BIOPSY;  Surgeon: Iva Boop, MD;  Location: [facility];  Service: Gastroenterology;;   BIOPSY  05/12/2023   Procedure: BIOPSY;  Surgeon: Iva Boop, MD;  Location: [facility];  Service: Gastroenterology;;   Arbutus Leas Left 07/19/2020   Procedure: Delos Haring;  Surgeon: Edwin Cap, DPM;  Location: [facility];  Service: Podiatry;  Laterality: Left;   COLONOSCOPY  last one 10-06-2017  dr Leone Payor   ESOPHAGOGASTRODUODENOSCOPY N/A 05/12/2023   Procedure: ESOPHAGOGASTRODUODENOSCOPY (EGD);  Surgeon: Iva Boop, MD;  Location: [facility];  Service: Gastroenterology;  Laterality: N/A;    ESOPHAGOGASTRODUODENOSCOPY (EGD) WITH PROPOFOL N/A 05/10/2023   Procedure: ESOPHAGOGASTRODUODENOSCOPY (EGD) WITH PROPOFOL;  Surgeon: Iva Boop, MD;  Location: [facility];  Service: Gastroenterology;  Laterality: N/A;   HAMMER TOE SURGERY Left 07/19/2020   Procedure: HAMMER TOE CORRECTION;  Surgeon: Edwin Cap, DPM;  Location: [facility];  Service: Podiatry;  Laterality: Left;   INGUINAL HERNIA REPAIR Right 11-21-2008  @ [facility]   IR IMAGING GUIDED PORT INSERTION  06/18/2023   LUMBAR LAMINECTOMY  1974   METATARSAL OSTEOTOMY Left 07/19/2020   Procedure: METATARSAL OSTEOTOMY;  Surgeon: Edwin Cap, DPM;  Location: [facility];  Service: Podiatry;  Laterality: Left;   ORIF TOE FRACTURE Left 07/19/2020   Procedure: OPEN TREATMENT OF METARSAL PHALANGEAL JOINT DISLOCATION;  Surgeon: Edwin Cap, DPM;  Location: [facility];  Service: Podiatry;  Laterality: Left;  ANKLE BLOCK WITH THIGH TOURNIQUET   Patient Active Problem List   Diagnosis Date Noted   Depression 07/01/2023   Coronary artery calcification seen on CT scan 07/01/2023   Aortic atherosclerosis (HCC) 07/01/2023   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   Malnutrition of moderate degree 05/14/2023   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   Gastric outlet obstruction 05/10/2023   Duodenitis 05/08/2023   Thrombocytopenia (HCC) 05/08/2023   Epigastric pain 04/30/2023   Asthmatic bronchitis 10/09/2021   Impacted ear wax 10/09/2021   Vitamin D deficiency 06/23/2021   Sinus infection 07/29/2020  Hammertoe of left foot    Tailor's bunion of left foot    Deformity of metatarsal bone of left foot    Injury of plantar plate of left foot    Hallux interphalangeus, acquired, left    Blood pressure elevated without history of HTN 04/02/2020   Ingrown nail 06/16/2019   Acute conjunctivitis of right eye 12/13/2018   Acute pharyngitis 12/13/2018   Dizziness 06/14/2018   Left inguinal hernia  06/11/2017   Mass of left side of neck 06/11/2017   Rash and nonspecific skin eruption 05/25/2014   Impaired glucose tolerance 04/03/2013   Hives 03/10/2013   History of colonic polyps 06/07/2012   Fatigue 03/30/2012   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   Allergic rhinitis 12/29/2011   Arthritis    Hyperlipidemia 01/30/2010   BENIGN PROSTATIC HYPERTROPHY, MILD, HX OF 01/30/2010   Erectile dysfunction 01/26/2008    PCP: Oliver Barre, MD  REFERRING PROVIDER: Lonna Cobb, NP  REFERRING DIAG: Ampullary Cancer  THERAPY DIAG:  Ampullary carcinoma (HCC)  Foot drop, right  Muscle weakness (generalized)  Difficulty walking  ONSET DATE: 06/15/2023  Rationale for Evaluation and Treatment: Rehabilitation  SUBJECTIVE:                                                                                                                                                                                           SUBJECTIVE STATEMENT:  08/11/2023 My stomach is bothering me today. I finished my pills yesterday. The pills really affect my skin on my hands and my feet feel really sensitive. They may cut down the pills when I go for my next chemo due to side effects. I wore th AFO yesterday and did well. I notice my leg gets really tired when I walk for a while without it.  EVAL Pt reports he has an appt scheduled for an AFO for his right foot but wanted to come to therapy to have his balance and strength checked. He developed Foot drop right after he got out of the hospital.  He is presently unable to drive due to foot drop. He has no appetite and has no energy for about 3 days after his infusion, but otherwise he is doing well. I am doing pretty well I think. 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 Duodenal obstruction August 2024 secondary to #1 H. pylori gastritis 8/19/202 Pt developed Right foot drop. Had lumbar MRI 07/16/2023. He has an appt scheduled for AFO    PAIN:  Are you having pain? No, just numbness in the right lower leg into the foot.  PRECAUTIONS: Fall risk;drop foot  RED FLAGS: None   WEIGHT BEARING RESTRICTIONS: No  FALLS:  Has patient fallen in last 6 months? No  LIVING ENVIRONMENT: Lives with: lives with their spouse Lives in: House/apartment Stairs: Yes; Internal: 13 steps; on left going up and External: 3 steps; none Has following equipment at home: Quad cane large base and Walker - 2 wheeled  OCCUPATION: retired  Human resources officer: enjoys exercising, pool  PRIOR LEVEL OF FUNCTION: Independent  PATIENT GOALS: improve ankle ROM, balance, be able to drive   OBJECTIVE: Note: Objective measures were completed at Evaluation unless otherwise noted.  COGNITION: Overall cognitive status: Within functional limits for tasks assessed   PALPATION: NT  OBSERVATIONS / OTHER ASSESSMENTS:ambulates with steppage gait due to Right foot drop  08/05/2023 PROM DF-10 with knee extended after stretching  SENSATION: Light touch: Deficits , numbness in right    POSTURE: forward head, rounded shoulders  LOWER EXTREMITY STRENGTH:  MMT Right eval  Hip flexion 5  Hip extension Able to bridge  Hip abduction 4+  Hip adduction   Hip internal rotation 4+  Hip external rotation 4+  Knee flexion 5  Knee extension 5  Ankle dorsiflexion trace  Ankle plantarflexion 3+  Ankle inversion 2-  Ankle eversion 1+  Great toe extension 0   (Blank rows = not tested)  MMT LEFT eval  Hip flexion 4+  Hip  extension Can bridge  Hip abduction 4+  Hip adduction   Hip internal rotation 4  Hip external rotation 5  Knee flexion 5  Knee extension 4+  Ankle dorsiflexion 4+  Ankle plantarflexion 5  Ankle inversion 4  Ankle eversion 4  Great toe extension     (Blank rows = not tested)  LYMPHEDEMA ASSESSMENTS:   SURGERY TYPE/DATE: 05/17/2023 Pancreatic Duodenectomy  NUMBER OF LYMPH NODES REMOVED: 3 he thinks  CHEMOTHERAPY: Yes currently  RADIATION:NO  HORMONE TREATMENT: NO  INFECTIONS: NO  FUNCTIONAL TESTS:  5 times sit to stand 16.75 sec  4 position balance test; able to maintain all but Right SLS for 10 sec  GAIT: Distance walked: steppage gait due to drop foot on right Assistive device utilized: Quad cane small base Level of assistance: modified independence Comments: Pt needed instruction to use cane in his left UE, and to place it squarly on the floor. Adjusted cane height and base so he could use properly on the left and practiced walking in the gym.    Outcome measure: LE functional Scale:18   TODAY'S TREATMENT:  DATE:   08/11/2023 AFO is padded and appears to fit well, but was cautioned to wear for short periods and check his skin to be sure there is no irritation NuSTep Lev 5, seat 10, UE 8, x 11 min  583 steps Mini squats x 15 Step ups on airex x 10 ea forward and lateral, no HH Marching on ax x 15 no HH rest Mini lunges x 10 light HH  Cable machine 7 # 2 x 10 for scapular retraction  seated rest Pt requested to be done; his stomach was bothering him  08/09/2023 NuSTep Lev 4, seat 10, UE 8, x 11 in  642 steps Mini squats x 15 with HH Mini lunges x 10 ea side with HH Marching in place x 15 no HH Sidestepping 3 lengths in bars, rest and H20, no HH Cable machine; scapular retraction 7# 2 x 10, biceps curls 7 # 2 x 10 Sit to  stand  without UE'sx 10 with emphasis on posture and slowly lowering Ambulated without cane cancer gym to ortho gym down and back 2x's without rest with emphasis on posture and head up Showed pt sitting heel taps to try at home to try and get some DF return   08/05/2023 NuStep Lev 4, seat 10, UE 8 x 8 min, 433 steps PROM right ankle 3 x 30, Ankle DF  PROM = -10 Gastrc stretch with leg supported by table 3 x 30 using strap Foot  placement on step no HH x 10 forward and sideways B Partial step ups 6 in x 10 forward and lateral B with light HH Lunge with foot on step to increase DF Standing marching x 10 B No HH, heel raises x 10 B Step ups on ax x10 B no HH Ax beam 4 lengths forward, 4 lengths sideways with intermittent HH seated rest Sit to stand from chair;no hands x 10 Mini squat light hold on bars x 10 Mini lunges x 10 B with light HH seated rest Updated HEP  07/20/2023 Educated pt in right gastroc stretch with strap and standing runners stretch at wall. Gave illustrated and written instructions. Also educated pt in proper way to ambulate with quad cane in his left UE with proper cane placement and the need to support Right LE. Also practiced some with SPC. He is getting his AFO next week.    PATIENT EDUCATION:  Access Code: [email or web address] URL: [email or web address] Date: 08/05/2023 Prepared by: Alvira Monday  Exercises - Step Up  - 1 x daily - 7 x weekly - 1 sets - 15 reps - Standing Knee Flexion Stretch on Step  - 1 x daily - 7 x weekly - 1 sets - 5 reps - 15 hold - Mini Squat  - 1 x daily - 7 x weekly - 1 sets - 10 reps - Mini Lunge  - 1 x daily - 7 x weekly - 13 sets - 10 reps Education details: Access Code: [email or web address] URL: [email or web address] Date: 07/20/2023 Prepared by: Alvira Monday Exercises above to be done with light hand hold on railing or counter Exercises - Seated Calf Stretch with Strap  - 2 x daily - 7 x weekly - 1 sets - 3 reps - 30  hold - Standing Gastroc Stretch  - 1 x daily - 7 x weekly - 1 sets - 3 reps - 30 hold Person educated: Patient Education method: Explanation, Demonstration, and Handouts Education comprehension: returned demonstration and needs further education  HOME EXERCISE PROGRAM: Seated gastroc  stretch with strap, standing runners stretch  ASSESSMENT:  CLINICAL IMPRESSION: Pt did not feel well today but was able to get in a fairly good workout before his stomach really bothered him and he asked to be finished for the day. His form for mini squats and lunges is improving. He wore his AFO today which does help his gait. He was able to increase a level with the Nustep today.   OBJECTIVE IMPAIRMENTS: Abnormal gait, decreased activity tolerance, decreased balance, decreased knowledge of condition, decreased ROM, impaired sensation, and postural dysfunction.   ACTIVITY LIMITATIONS: carrying, standing, stairs, and locomotion level  PARTICIPATION LIMITATIONS: driving, shopping, community activity, and yard work  PERSONAL FACTORS: Age and 1-2 comorbidities: Cancer, chemo, foot drop  are also affecting patient's functional outcome.   REHAB POTENTIAL: Good  CLINICAL DECISION MAKING: Stable/uncomplicated  EVALUATION COMPLEXITY: Low   GOALS: Goals reviewed with patient? Yes  SHORT TERM GOALS    Target date: 08/10/2023  Pt will  walk with more normal gait pattern utilizing an AFO Baseline: Goal status: INITIAL   2.  Pt will improve LEFS to atleast 35/80 for improved function Baseline:  Goal status: INITIAL  3.  Pt will be able to maintain tandem stance with right foot behind for 15 seconds to decrease fall risk Baseline:  Goal status: INITIAL  4.  Pt will be independent with HEP for improved strength, balance and ankle ROM Baseline:  Goal status: INITIAL  5. Pt will be able to resume driving with AFO GOAL status;INITIAL   LONG TERM GOALS: Target date: 08/31/2023  Pt will improve 5  time sit to stand test to no greater than 14.8 seconds to decrease fall risk Baseline:  Goal status: INITIAL  2.  Pt will have LEFS increased to 45/80 for improved function Baseline:  Goal status: INITIAL  3.  Pt will have right ankle PROM maintained at 5-8 degrees for improved function Baseline:  Goal status: INITIAL  4.  Pt will ambulate safely  with near normal gait without AD using AFO Baseline:  Goal status: INITIAL  5.  Pt will be able to go up and down stairs with near normal gait using hand rail Baseline:  Goal status: INITIAL 6.  Pt will have right SLS 10 seconds with AFO  Baseline 5 sec   PLAN:  PT FREQUENCY: 2x/week  PT DURATION: 6 weeks  PLANNED INTERVENTIONS: 16109- PT Re-evaluation, 97110-Therapeutic exercises, 97112- Neuromuscular re-education, 97535- Self Care, 60454- Manual therapy, [phone]- Gait training, Patient/Family education, Balance training, Joint mobilization, Therapeutic exercises, Neuromuscular re-education, Gait training, and Self Care, aquatic therapy  PLAN FOR NEXT SESSION: gait training with AD prn, NU step, balance, step ups, review gastroc stretches, generalized strength, Fxl activities   Waynette Buttery, PT 08/11/2023, 10:46 AM

## 2023-08-11 NOTE — Progress Notes (Signed)
Specialty Pharmacy Refill Coordination Note  Charles Lawson is a 82 y.o. male contacted today regarding refills of specialty medication(s) Capecitabine   Patient requested Delivery   Delivery date: 08/17/23   Verified address: [address] [place] [address]   Medication will be filled on 08/16/23.   Per wife, provider gave patient a couple more days in his off week this cycle so next cycle will start on 11/29.

## 2023-08-12 ENCOUNTER — Other Ambulatory Visit: Payer: Self-pay

## 2023-08-12 ENCOUNTER — Other Ambulatory Visit: Payer: Self-pay | Admitting: Internal Medicine

## 2023-08-12 ENCOUNTER — Telehealth: Payer: Self-pay | Admitting: Internal Medicine

## 2023-08-12 MED ORDER — OMEPRAZOLE MAGNESIUM 20 MG PO TBEC: 20.0000 mg | DELAYED_RELEASE_TABLET | Freq: Every day | ORAL | 0 refills | Status: DC

## 2023-08-12 MED ORDER — TAMSULOSIN HCL 0.4 MG PO CAPS: 0.4000 mg | ORAL_CAPSULE | Freq: Every day | ORAL | 0 refills | Status: DC

## 2023-08-12 NOTE — Telephone Encounter (Signed)
Refill sent.

## 2023-08-12 NOTE — Telephone Encounter (Signed)
Prescription Request  08/12/2023  LOV: 06/29/2023  What is the name of the medication or equipment? Omeprazole 20 mg, tamulosin 0.4 mg.  Have you contacted your pharmacy to request a refill? Yes   Which pharmacy would you like this sent to?  [id number] [place], [place] - [address] [place] [address] Phone: [phone] Fax: [phone]   Patient notified that their request is being sent to the clinical staff for review and that they should receive a response within 2 business days.   Please advise at Mobile [phone] (mobile)

## 2023-08-13 ENCOUNTER — Encounter: Payer: Self-pay | Admitting: Internal Medicine

## 2023-08-16 ENCOUNTER — Ambulatory Visit: Payer: Medicare Other

## 2023-08-16 ENCOUNTER — Other Ambulatory Visit: Payer: Self-pay

## 2023-08-16 DIAGNOSIS — C241 Malignant neoplasm of ampulla of Vater: Secondary | ICD-10-CM

## 2023-08-16 DIAGNOSIS — R262 Difficulty in walking, not elsewhere classified: Secondary | ICD-10-CM

## 2023-08-16 DIAGNOSIS — M6281 Muscle weakness (generalized): Secondary | ICD-10-CM

## 2023-08-16 DIAGNOSIS — M21371 Foot drop, right foot: Secondary | ICD-10-CM | POA: Diagnosis not present

## 2023-08-16 MED ORDER — OMEPRAZOLE 20 MG PO CPDR: 20.0000 mg | DELAYED_RELEASE_CAPSULE | Freq: Every day | ORAL | 3 refills | Status: DC

## 2023-08-16 NOTE — Telephone Encounter (Signed)
Ok done

## 2023-08-16 NOTE — Telephone Encounter (Signed)
Patient's wife called and said the omeprazole was supposed to be 20 mg and Dr. Jonny Ruiz sent in 40 mg. They would like to know if he can send in the 20 mg instead. Best callback for Charles Lawson is [phone]. Best callback for patient is [phone].

## 2023-08-16 NOTE — Therapy (Signed)
OUTPATIENT PHYSICAL THERAPY  LOWER EXTREMITY ONCOLOGY TREATMENT  Patient Name: Charles Lawson MRN: [id number] DOB:[birth year], 82 y.o., male Today's Date: 08/16/2023  END OF SESSION:  PT End of Session - 08/16/23 1102     Visit Number 5    Number of Visits 12    Date for PT Re-Evaluation 08/31/23    PT Start Time 1102    PT Stop Time 1149    PT Time Calculation (min) 47 min    Activity Tolerance Patient tolerated treatment well    Behavior During Therapy [facility] for tasks assessed/performed             Past Medical History:  Diagnosis Date   Allergic rhinitis, cause unspecified    Arthritis    Blood pressure elevated without history of HTN    BPH (benign prostatic hyperplasia)    Diverticulosis of colon    ED (erectile dysfunction)    Helicobacter pylori gastritis 04/2023   Tx quad tx and eradication documented neg stool ag 07/2023   History of basal cell carcinoma (BCC) excision    Hyperlipidemia    Impaired glucose tolerance 04/03/2013   Personal history of colonic polyps 06/07/2012   Past Surgical History:  Procedure Laterality Date   Quintella Reichert OSTEOTOMY Left 07/19/2020   Procedure: Quintella Reichert OSTEOTOMY;  Surgeon: Edwin Cap, DPM;  Location: [facility];  Service: Podiatry;  Laterality: Left;   BIOPSY  05/10/2023   Procedure: BIOPSY;  Surgeon: Iva Boop, MD;  Location: [facility];  Service: Gastroenterology;;   BIOPSY  05/12/2023   Procedure: BIOPSY;  Surgeon: Iva Boop, MD;  Location: [facility];  Service: Gastroenterology;;   Arbutus Leas Left 07/19/2020   Procedure: Delos Haring;  Surgeon: Edwin Cap, DPM;  Location: [facility];  Service: Podiatry;  Laterality: Left;   COLONOSCOPY  last one 10-06-2017  dr Leone Payor   ESOPHAGOGASTRODUODENOSCOPY N/A 05/12/2023   Procedure: ESOPHAGOGASTRODUODENOSCOPY (EGD);  Surgeon: Iva Boop, MD;  Location: [facility];  Service: Gastroenterology;  Laterality: N/A;    ESOPHAGOGASTRODUODENOSCOPY (EGD) WITH PROPOFOL N/A 05/10/2023   Procedure: ESOPHAGOGASTRODUODENOSCOPY (EGD) WITH PROPOFOL;  Surgeon: Iva Boop, MD;  Location: [facility];  Service: Gastroenterology;  Laterality: N/A;   HAMMER TOE SURGERY Left 07/19/2020   Procedure: HAMMER TOE CORRECTION;  Surgeon: Edwin Cap, DPM;  Location: [facility];  Service: Podiatry;  Laterality: Left;   INGUINAL HERNIA REPAIR Right 11-21-2008  @ [facility]   IR IMAGING GUIDED PORT INSERTION  06/18/2023   LUMBAR LAMINECTOMY  1974   METATARSAL OSTEOTOMY Left 07/19/2020   Procedure: METATARSAL OSTEOTOMY;  Surgeon: Edwin Cap, DPM;  Location: [facility];  Service: Podiatry;  Laterality: Left;   ORIF TOE FRACTURE Left 07/19/2020   Procedure: OPEN TREATMENT OF METARSAL PHALANGEAL JOINT DISLOCATION;  Surgeon: Edwin Cap, DPM;  Location: [facility];  Service: Podiatry;  Laterality: Left;  ANKLE BLOCK WITH THIGH TOURNIQUET   Patient Active Problem List   Diagnosis Date Noted   Depression 07/01/2023   Coronary artery calcification seen on CT scan 07/01/2023   Aortic atherosclerosis (HCC) 07/01/2023   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   Malnutrition of moderate degree 05/14/2023   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   Gastric outlet obstruction 05/10/2023   Duodenitis 05/08/2023   Thrombocytopenia (HCC) 05/08/2023   Epigastric pain 04/30/2023   Asthmatic bronchitis 10/09/2021   Impacted ear wax 10/09/2021   Vitamin D deficiency 06/23/2021   Sinus infection 07/29/2020  Hammertoe of left foot    Tailor's bunion of left foot    Deformity of metatarsal bone of left foot    Injury of plantar plate of left foot    Hallux interphalangeus, acquired, left    Blood pressure elevated without history of HTN 04/02/2020   Ingrown nail 06/16/2019   Acute conjunctivitis of right eye 12/13/2018   Acute pharyngitis 12/13/2018   Dizziness 06/14/2018   Left inguinal hernia  06/11/2017   Mass of left side of neck 06/11/2017   Rash and nonspecific skin eruption 05/25/2014   Impaired glucose tolerance 04/03/2013   Hives 03/10/2013   History of colonic polyps 06/07/2012   Fatigue 03/30/2012   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   Allergic rhinitis 12/29/2011   Arthritis    Hyperlipidemia 01/30/2010   BENIGN PROSTATIC HYPERTROPHY, MILD, HX OF 01/30/2010   Erectile dysfunction 01/26/2008    PCP: Oliver Barre, MD  REFERRING PROVIDER: Lonna Cobb, NP  REFERRING DIAG: Ampullary Cancer  THERAPY DIAG:  Ampullary carcinoma (HCC)  Foot drop, right  Muscle weakness (generalized)  Difficulty walking  ONSET DATE: 06/15/2023  Rationale for Evaluation and Treatment: Rehabilitation  SUBJECTIVE:                                                                                                                                                                                           SUBJECTIVE STATEMENT:   I felt pretty bad with my stomach up until yesterday. I was able to eat a little last night and this am. I am walking better, and not using the cane anymore.   EVAL Pt reports he has an appt scheduled for an AFO for his right foot but wanted to come to therapy to have his balance and strength checked. He developed Foot drop right after he got out of the hospital.  He is presently unable to drive due to foot drop. He has no appetite and has no energy for about 3 days after his infusion, but otherwise he is doing well. I am doing pretty well I think. 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 Duodenal obstruction August 2024 secondary to #1 H. pylori gastritis 8/19/202 Pt developed Right foot drop. Had lumbar MRI 07/16/2023. He has an appt scheduled for AFO  PAIN:  Are you having pain? No, just numbness in the right lower leg into the foot.  PRECAUTIONS: Fall risk;drop foot  RED FLAGS: None   WEIGHT BEARING RESTRICTIONS: No  FALLS:  Has patient fallen in last 6 months? No  LIVING ENVIRONMENT: Lives with: lives with their spouse Lives in: House/apartment Stairs: Yes; Internal: 13 steps; on left going up and External: 3 steps; none Has following equipment at home: Quad cane large base and Walker - 2 wheeled  OCCUPATION: retired  Human resources officer: enjoys exercising, pool  PRIOR LEVEL OF FUNCTION: Independent  PATIENT GOALS: improve ankle ROM, balance, be able to drive   OBJECTIVE: Note: Objective measures were completed at Evaluation unless otherwise noted.  COGNITION: Overall cognitive status: Within functional limits for tasks assessed   PALPATION: NT  OBSERVATIONS / OTHER ASSESSMENTS:ambulates with steppage gait due to Right foot drop  08/05/2023 PROM DF-10 with knee extended after stretching  SENSATION: Light touch: Deficits , numbness in right    POSTURE: forward head, rounded shoulders  LOWER EXTREMITY STRENGTH:  MMT Right eval  Hip flexion 5  Hip extension Able to bridge  Hip abduction 4+  Hip adduction   Hip internal rotation 4+  Hip external rotation 4+  Knee flexion 5  Knee extension 5  Ankle dorsiflexion trace  Ankle plantarflexion 3+  Ankle inversion 2-  Ankle eversion 1+  Great toe extension 0   (Blank rows = not tested)  MMT LEFT eval  Hip flexion 4+  Hip extension Can bridge  Hip abduction 4+  Hip adduction   Hip internal rotation 4  Hip external rotation 5  Knee flexion 5  Knee extension 4+  Ankle dorsiflexion  4+  Ankle plantarflexion 5  Ankle inversion 4  Ankle eversion 4  Great toe extension     (Blank rows = not tested)  LYMPHEDEMA ASSESSMENTS:   SURGERY TYPE/DATE: 05/17/2023 Pancreatic Duodenectomy  NUMBER OF LYMPH NODES REMOVED: 3 he thinks  CHEMOTHERAPY: Yes currently  RADIATION:NO  HORMONE TREATMENT: NO  INFECTIONS: NO  FUNCTIONAL TESTS:  5 times sit to stand 16.75 sec  4 position balance test; able to maintain all but Right SLS for 10 sec  GAIT: Distance walked: steppage gait due to drop foot on right Assistive device utilized: Quad cane small base Level of assistance: modified independence Comments: Pt needed instruction to use cane in his left UE, and to place it squarly on the floor. Adjusted cane height and base so he could use properly on the left and practiced walking in the gym.    Outcome measure: LE functional Scale:18   TODAY'S TREATMENT:                                                                                                                                          DATE:   08/16/2023 Marching on floor x 10 No HH Marching on ax x  15 no HH rest Mini lunges x 10  B no HH 3 D hip B. With yellow band x 10 ea  Lateral band walks in bar with yellow band 6 lengths in bars Nu Step lev 6, seat 10, UE 9 x , 513 steps Sit to stand x 10 Partial step ups 8 in x 10 B SLS to failure x 4 Bilaterally  08/11/2023 AFO is padded and appears to fit well, but was cautioned to wear for short periods and check his skin to be sure there is no irritation NuSTep Lev 5, seat 10, UE 8, x 11 min  583 steps Mini squats x 15 Step ups on airex x 10 ea forward and lateral, no HH Marching on ax x 15 no HH rest Mini lunges x 10 light HH  Cable machine 7 # 2 x 10 for scapular retraction  seated rest Pt requested to be done; his stomach was bothering him  08/09/2023 NuSTep Lev 4, seat 10, UE 8, x 11 in  642 steps Mini squats x 15 with HH Mini lunges x 10 ea side  with HH Marching in place x 15 no HH Sidestepping 3 lengths in bars, rest and H20, no HH Cable machine; scapular retraction 7# 2 x 10, biceps curls 7 # 2 x 10 Sit to stand  without UE'sx 10 with emphasis on posture and slowly lowering Ambulated without cane cancer gym to ortho gym down and back 2x's without rest with emphasis on posture and head up Showed pt sitting heel taps to try at home to try and get some DF return   08/05/2023 NuStep Lev 4, seat 10, UE 8 x 8 min, 433 steps PROM right ankle 3 x 30, Ankle DF  PROM = -10 Gastrc stretch with leg supported by table 3 x 30 using strap Foot  placement on step no HH x 10 forward and sideways B Partial step ups 6 in x 10 forward and lateral B with light HH Lunge with foot on step to increase DF Standing marching x 10 B No HH, heel raises x 10 B Step ups on ax x10 B no HH Ax beam 4 lengths forward, 4 lengths sideways with intermittent HH seated rest Sit to stand from chair;no hands x 10 Mini squat light hold on bars x 10 Mini lunges x 10 B with light HH seated rest Updated HEP  07/20/2023 Educated pt in right gastroc stretch with strap and standing runners stretch at wall. Gave illustrated and written instructions. Also educated pt in proper way to ambulate with quad cane in his left UE with proper cane placement and the need to support Right LE. Also practiced some with SPC. He is getting his AFO next week.    PATIENT EDUCATION:  Access Code: [email or web address] URL: [email or web address] Date: 08/05/2023 Prepared by: Alvira Monday  Exercises - Step Up  - 1 x daily - 7 x weekly - 1 sets - 15 reps - Standing Knee Flexion Stretch on Step  - 1 x daily - 7 x weekly - 1 sets - 5 reps - 15 hold - Mini Squat  - 1 x daily - 7 x weekly - 1 sets - 10 reps - Mini Lunge  - 1 x daily - 7 x weekly - 13 sets - 10 reps Education details: Access Code: [email or web address] URL: [email or web address] Date: 07/20/2023 Prepared by: Alvira Monday Exercises above to be done with light hand hold on railing or counter Exercises - Seated Calf Stretch with  Strap  - 2 x daily - 7 x weekly - 1 sets - 3 reps - 30 hold - Standing Gastroc Stretch  - 1 x daily - 7 x weekly - 1 sets - 3 reps - 30 hold Person educated: Patient Education method: Explanation, Demonstration, and Handouts Education comprehension: returned demonstration and needs further education  HOME EXERCISE PROGRAM: Seated gastroc stretch with strap, standing runners stretch  ASSESSMENT:  CLINICAL IMPRESSION: Pt has been able to eat a little the last few days and felt better in clinic today. Able to increase NuStep resistance, and step height with good form. Requires VC for proper erect posture with exs.   OBJECTIVE IMPAIRMENTS: Abnormal gait, decreased activity tolerance, decreased balance, decreased knowledge of condition, decreased ROM, impaired sensation, and postural dysfunction.   ACTIVITY LIMITATIONS: carrying, standing, stairs, and locomotion level  PARTICIPATION LIMITATIONS: driving, shopping, community activity, and yard work  PERSONAL FACTORS: Age and 1-2 comorbidities: Cancer, chemo, foot drop  are also affecting patient's functional outcome.   REHAB POTENTIAL: Good  CLINICAL DECISION MAKING: Stable/uncomplicated  EVALUATION COMPLEXITY: Low   GOALS: Goals reviewed with patient? Yes  SHORT TERM GOALS    Target date: 08/10/2023  Pt will  walk with more normal gait pattern utilizing an AFO Baseline: Goal status: INITIAL   2.  Pt will improve LEFS to atleast 35/80 for improved function Baseline:  Goal status: INITIAL  3.  Pt will be able to maintain tandem stance with right foot behind for 15 seconds to decrease fall risk Baseline:  Goal status: INITIAL  4.  Pt will be independent with HEP for improved strength, balance and ankle ROM Baseline:  Goal status: INITIAL  5. Pt will be able to resume driving with AFO GOAL  status;INITIAL   LONG TERM GOALS: Target date: 08/31/2023  Pt will improve 5 time sit to stand test to no greater than 14.8 seconds to decrease fall risk Baseline:  Goal status: INITIAL  2.  Pt will have LEFS increased to 45/80 for improved function Baseline:  Goal status: INITIAL  3.  Pt will have right ankle PROM maintained at 5-8 degrees for improved function Baseline:  Goal status: INITIAL  4.  Pt will ambulate safely  with near normal gait without AD using AFO Baseline:  Goal status: INITIAL  5.  Pt will be able to go up and down stairs with near normal gait using hand rail Baseline:  Goal status: INITIAL 6.  Pt will have right SLS 10 seconds with AFO  Baseline 5 sec   PLAN:  PT FREQUENCY: 2x/week  PT DURATION: 6 weeks  PLANNED INTERVENTIONS: 82956- PT Re-evaluation, 97110-Therapeutic exercises, 97112- Neuromuscular re-education, 97535- Self Care, [id number]- Manual therapy, [phone]- Gait training, Patient/Family education, Balance training, Joint mobilization, Therapeutic exercises, Neuromuscular re-education, Gait training, and Self Care, aquatic therapy  PLAN FOR NEXT SESSION: gait training with AD prn, NU step, balance, step ups, review gastroc stretches, generalized strength, Fxl activities   Waynette Buttery, PT 08/16/2023, 11:59 AM

## 2023-08-16 NOTE — Telephone Encounter (Signed)
Ok this is done

## 2023-08-18 ENCOUNTER — Inpatient Hospital Stay: Payer: Medicare Other

## 2023-08-18 ENCOUNTER — Inpatient Hospital Stay: Payer: Medicare Other | Admitting: Nutrition

## 2023-08-18 ENCOUNTER — Other Ambulatory Visit: Payer: Self-pay | Admitting: Oncology

## 2023-08-18 ENCOUNTER — Inpatient Hospital Stay (HOSPITAL_BASED_OUTPATIENT_CLINIC_OR_DEPARTMENT_OTHER): Payer: Medicare Other | Admitting: Oncology

## 2023-08-18 VITALS — BP 141/74 | HR 93 | Temp 97.9°F | Resp 18 | Ht 71.0 in | Wt 169.5 lb

## 2023-08-18 DIAGNOSIS — C241 Malignant neoplasm of ampulla of Vater: Secondary | ICD-10-CM

## 2023-08-18 DIAGNOSIS — Z23 Encounter for immunization: Secondary | ICD-10-CM | POA: Diagnosis not present

## 2023-08-18 DIAGNOSIS — Z5111 Encounter for antineoplastic chemotherapy: Secondary | ICD-10-CM | POA: Diagnosis not present

## 2023-08-18 DIAGNOSIS — B9681 Helicobacter pylori [H. pylori] as the cause of diseases classified elsewhere: Secondary | ICD-10-CM | POA: Diagnosis not present

## 2023-08-18 DIAGNOSIS — M21371 Foot drop, right foot: Secondary | ICD-10-CM | POA: Diagnosis not present

## 2023-08-18 DIAGNOSIS — M48061 Spinal stenosis, lumbar region without neurogenic claudication: Secondary | ICD-10-CM | POA: Diagnosis not present

## 2023-08-18 LAB — CBC WITH DIFFERENTIAL (CANCER CENTER ONLY)
Abs Immature Granulocytes: 0.07 10*3/uL (ref 0.00–0.07)
Basophils Absolute: 0.1 10*3/uL (ref 0.0–0.1)
Basophils Relative: 1 %
Eosinophils Absolute: 0.3 10*3/uL (ref 0.0–0.5)
Eosinophils Relative: 3 %
HCT: 35.4 % — ABNORMAL LOW (ref 39.0–52.0)
Hemoglobin: 12.8 g/dL — ABNORMAL LOW (ref 13.0–17.0)
Immature Granulocytes: 1 %
Lymphocytes Relative: 29 %
Lymphs Abs: 2.4 10*3/uL (ref 0.7–4.0)
MCH: 32.2 pg (ref 26.0–34.0)
MCHC: 36.2 g/dL — ABNORMAL HIGH (ref 30.0–36.0)
MCV: 88.9 fL (ref 80.0–100.0)
Monocytes Absolute: 1.3 10*3/uL — ABNORMAL HIGH (ref 0.1–1.0)
Monocytes Relative: 15 %
Neutro Abs: 4.3 10*3/uL (ref 1.7–7.7)
Neutrophils Relative %: 51 %
Platelet Count: 284 10*3/uL (ref 150–400)
RBC: 3.98 MIL/uL — ABNORMAL LOW (ref 4.22–5.81)
RDW: 22 % — ABNORMAL HIGH (ref 11.5–15.5)
WBC Count: 8.3 10*3/uL (ref 4.0–10.5)
nRBC: 0 % (ref 0.0–0.2)

## 2023-08-18 LAB — CMP (CANCER CENTER ONLY)
ALT: 20 U/L (ref 0–44)
AST: 21 U/L (ref 15–41)
Albumin: 3.5 g/dL (ref 3.5–5.0)
Alkaline Phosphatase: 58 U/L (ref 38–126)
Anion gap: 10 (ref 5–15)
BUN: 11 mg/dL (ref 8–23)
CO2: 26 mmol/L (ref 22–32)
Calcium: 9 mg/dL (ref 8.9–10.3)
Chloride: 102 mmol/L (ref 98–111)
Creatinine: 0.69 mg/dL (ref 0.61–1.24)
GFR, Estimated: >60 mL/min (ref >60)
Glucose, Bld: 127 mg/dL — ABNORMAL HIGH (ref 70–99)
Potassium: 3.1 mmol/L — ABNORMAL LOW (ref 3.5–5.1)
Sodium: 138 mmol/L (ref 135–145)
Total Bilirubin: 0.6 mg/dL (ref <1.2)
Total Protein: 6.2 g/dL — ABNORMAL LOW (ref 6.5–8.1)

## 2023-08-18 MED ORDER — SODIUM CHLORIDE 0.9 % IV SOLN
2000.0000 mg | Freq: Once | INTRAVENOUS | Status: AC
  Administered 2023-08-18: 2000 mg via INTRAVENOUS
  Filled 2023-08-18: qty 52.6

## 2023-08-18 MED ORDER — POTASSIUM CHLORIDE CRYS ER 10 MEQ PO TBCR: 10.0000 meq | EXTENDED_RELEASE_TABLET | Freq: Two times a day (BID) | ORAL | 2 refills | Status: DC

## 2023-08-18 MED ORDER — SODIUM CHLORIDE 0.9 % IV SOLN: Freq: Once | INTRAVENOUS | Status: AC

## 2023-08-18 MED ORDER — POTASSIUM CHLORIDE CRYS ER 20 MEQ PO TBCR: 20.0000 meq | EXTENDED_RELEASE_TABLET | Freq: Two times a day (BID) | ORAL | 2 refills | Status: DC

## 2023-08-18 MED ORDER — SODIUM CHLORIDE 0.9% FLUSH
10.0000 mL | INTRAVENOUS | Status: DC | PRN
  Administered 2023-08-18: 10 mL

## 2023-08-18 MED ORDER — PROCHLORPERAZINE MALEATE 10 MG PO TABS
10.0000 mg | ORAL_TABLET | Freq: Once | ORAL | Status: AC
Start: 2023-08-18 — End: 2023-08-18
  Administered 2023-08-18: 10 mg via ORAL
  Filled 2023-08-18: qty 1

## 2023-08-18 MED ORDER — HEPARIN SOD (PORK) LOCK FLUSH 100 UNIT/ML IV SOLN
500.0000 [IU] | Freq: Once | INTRAVENOUS | Status: AC | PRN
  Administered 2023-08-18: 500 [IU]

## 2023-08-18 NOTE — Patient Instructions (Signed)

## 2023-08-18 NOTE — Progress Notes (Signed)
[facility] OFFICE PROGRESS NOTE   Diagnosis: Pancreas cancer  INTERVAL HISTORY:   Mr. Fly returns as scheduled.  He completed another treatment with gemcitabine on 08/04/2023.  He completed the last cycle of Xeloda on 08/10/2023.  He reports developing ulceration at the lower lip, generalized mouth soreness, anorexia, and an increased skin rash at the end of the cycle of Xeloda.  The rash has improved over the past week.  No hand or foot pain.  No significant diarrhea.  He reports nausea and GI upset at the completion of the Xeloda cycle. The right foot drop has improved. Objective:  Vital signs in last 24 hours:  Blood pressure (!) 141/74, pulse 93, temperature 97.9 F (36.6 C), temperature source Temporal, resp. rate 18, height 5\' 11"  (1.803 m), weight 169 lb 8 oz (76.9 kg), SpO2 99%.    HEENT: No thrush or oral ulcers, mild ulceration at the lower inner lip Resp: Lungs clear bilaterally Cardio: Rate and rhythm GI: No hepatosplenomegaly, nontender Vascular: No leg edema  Skin: Dry maculopapular mildly erythematous rash over the trunk and extremities.  Palms without erythema.  Hyperpigmentation with dryness and callus formation at the soles Neurologic: 4/5 strength with dorsiflexion of the right foot  Portacath/PICC-without erythema  Lab Results:  Lab Results  Component Value Date   WBC 8.3 08/18/2023   HGB 12.8 (L) 08/18/2023   HCT 35.4 (L) 08/18/2023   MCV 88.9 08/18/2023   PLT 284 08/18/2023   NEUTROABS 4.3 08/18/2023    CMP  Lab Results  Component Value Date   NA 138 08/18/2023   K 3.1 (L) 08/18/2023   CL 102 08/18/2023   CO2 26 08/18/2023   GLUCOSE 127 (H) 08/18/2023   BUN 11 08/18/2023   CREATININE 0.69 08/18/2023   CALCIUM 9.0 08/18/2023   PROT 6.2 (L) 08/18/2023   ALBUMIN 3.5 08/18/2023   AST 21 08/18/2023   ALT 20 08/18/2023   ALKPHOS 58 08/18/2023   BILITOT 0.6 08/18/2023   GFRNONAA >60 08/18/2023   GFRAA  11/19/2008    >60         The eGFR has been calculated using the MDRD equation. This calculation has not been validated in all clinical situations. eGFR's persistently <60 mL/min signify possible Chronic Kidney Disease.    Lab Results  Component Value Date   IHK742 12 06/23/2023    No results found for: "INR", "LABPROT"  Imaging:  No results found.  Medications: I have reviewed the patient's current medications.   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 pylori gastritis 05/10/2023-we will confirm treatment plan with Dr. 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  L2-L3 mild spinal canal stenosis.  L3-L4 mild to moderate left neural foraminal narrowing.  L5-S1 mild bilateral neural foraminal narrowing.  Narrowing of the left lateral recess at L1-L2.     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  He has completed 2 cycles of gemcitabine/capecitabine.  He will begin cycle  3 gemcitabine today and capecitabine on 08/20/2023.  He has experienced significant toxicity from capecitabine with a skin rash, mucositis, and GI upset.  The capecitabine will be dose reduced to 500 mg in the a.m. and 1000 mg in the p.m. beginning with cycle 3.  He will return for an office  visit and day 15 gemcitabine on 09/01/2023.  He will discontinue capecitabine if he develops hand/foot pain, diarrhea, or increased rash during this cycle of capecitabine.  He will begin a potassium supplement.  Thornton Papas, MD  08/18/2023  11:09 AM

## 2023-08-18 NOTE — Patient Instructions (Addendum)
Hypokalemia Hypokalemia means that the amount of potassium in the blood is lower than normal. Potassium is a mineral (electrolyte) that helps regulate the amount of fluid in the body. It also stimulates muscle tightening (contraction) and helps nerves work properly. Normally, most of the body's potassium is inside cells, and only a very small amount is in the blood. Because the amount in the blood is so small, minor changes to potassium levels in the blood can be life-threatening. What are the causes? This condition may be caused by: Antibiotic medicine. Diarrhea or vomiting. Taking too much of a medicine that helps you have a bowel movement (laxative) can cause diarrhea and lead to hypokalemia. Chronic kidney disease (CKD). Medicines that help the body get rid of excess fluid (diuretics). Eating disorders, such as anorexia or bulimia. Low magnesium levels in the body. Sweating a lot. What are the signs or symptoms? Symptoms of this condition include: Weakness. Constipation. Fatigue. Muscle cramps. Mental confusion. Skipped heartbeats or irregular heartbeat (palpitations). Tingling or numbness. How is this diagnosed? This condition is diagnosed with a blood test. How is this treated? This condition may be treated by: Taking potassium supplements. Adjusting the medicines that you take. Eating more foods that contain a lot of potassium. If your potassium level is very low, you may need to get potassium through an IV and be monitored in the hospital. Follow these instructions at home: Eating and drinking  Eat a healthy diet. A healthy diet includes fresh fruits and vegetables, whole grains, healthy fats, and lean proteins. If told, eat more foods that contain a lot of potassium. These include: Nuts, such as peanuts and pistachios. Seeds, such as sunflower seeds and pumpkin seeds. Peas, lentils, and lima beans. Whole grain and bran cereals and breads. Fresh fruits and vegetables,  such as apricots, avocado, bananas, cantaloupe, kiwi, oranges, tomatoes, asparagus, and potatoes. Juices, such as orange, tomato, and prune. Lean meats, including fish. Milk and milk products, such as yogurt. General instructions Take over-the-counter and prescription medicines only as told by your health care provider. This includes vitamins, natural food products, and supplements. Keep all follow-up visits. This is important. Contact a health care provider if: You have weakness that gets worse. You feel your heart pounding or racing. You vomit. You have diarrhea. You have diabetes and you have trouble keeping your blood sugar in your target range. Get help right away if: You have chest pain. You have shortness of breath. You have vomiting or diarrhea that lasts for more than 2 days. You faint. These symptoms may be an emergency. Get help right away. Call 911. Do not wait to see if the symptoms will go away. Do not drive yourself to the hospital. Summary Hypokalemia means that the amount of potassium in the blood is lower than normal. This condition is diagnosed with a blood test. Hypokalemia may be treated by taking potassium supplements, adjusting the medicines that you take, or eating more foods that are high in potassium. If your potassium level is very low, you may need to get potassium through an IV and be monitored in the hospital. This information is not intended to replace advice given to you by your health care provider. Make sure you discuss any questions you have with your health care provider. Document Revised: 05/22/2021 Document Reviewed: 05/22/2021 Elsevier Patient Education  2024 Else[employer]. Potassium Chloride Extended-Release Capsules or Tablets  What is this medication? POTASSIUM CHLORIDE (poe TASS i um KLOOR ide) prevents and treats low levels of  potassium in your body. Potassium plays an important role in maintaining the health of your kidneys, heart, muscles,  and nervous system. This medicine may be used for other purposes; ask your health care provider or pharmacist if you have questions. COMMON BRAND NAME(S): K-10, K-8, Klor-Con, Micro-K, Micro-K Extencaps What should I tell my care team before I take this medication? They need to know if you have any of these conditions: Addison disease Dehydration Diabetes, high blood sugar Difficulty swallowing Heart disease High levels of potassium in the blood Irregular heartbeat or rhythm Kidney disease Large areas of burned skin Stomach ulcers, other stomach or intestine problems An unusual or allergic reaction to potassium, other medications, foods, dyes, or preservatives Pregnant or trying to get pregnant Breast-feeding How should I use this medication? Take this medication by mouth with a glass of water. Take it as directed on the prescription label at the same time every day. Take it with food. Do not cut, crush, chew, or suck this medication. Swallow the capsules whole. You may open the capsule and put the contents in a teaspoon of soft food, such as applesauce or pudding. Do not add to hot foods. Swallow the mixture right away. Do not chew the mixture. Drink a glass of water or juice after taking the mixture. Keep taking this medication unless your care team tells you to stop. Talk to your care team about the use of this medication in children. Special care may be needed. Overdosage: If you think you have taken too much of this medicine contact a poison control center or emergency room at once. NOTE: This medicine is only for you. Do not share this medicine with others. What if I miss a dose? If you miss a dose, take it as soon as you can. If it is almost time for your next dose, take only that dose. Do not take double or extra doses. What may interact with this medication? Do not take this medication with any of the following: Certain diuretics, such as spironolactone, triamterene Certain  medications for stomach problems, such as atropine; difenoxin or glycopyrrolate Eplerenone Sodium polystyrene sulfonate This medication may also interact with the following: Certain medications for blood pressure or heart disease, such as lisinopril, losartan, quinapril, valsartan Medications that lower your chance of fighting infection, such as cyclosporine, tacrolimus NSAIDs, medications for pain and inflammation, such as ibuprofen or naproxen Other potassium supplements Salt substitutes This list may not describe all possible interactions. Give your health care provider a list of all the medicines, herbs, non-prescription drugs, or dietary supplements you use. Also tell them if you smoke, drink alcohol, or use illegal drugs. Some items may interact with your medicine. What should I watch for while using this medication? Visit your care team for regular check-ups. You will need lab work done regularly. You may need to be on a special diet while taking this medication. Ask your care team. What side effects may I notice from receiving this medication? Side effects that you should report to your care team as soon as possible: Allergic reactions--skin rash, itching, hives, swelling of the face, lips, tongue, or throat Bowel blockage--stomach cramping, unable to have a bowel movement or pass gas, loss of appetite, vomiting Esophageal ulcer--loss of appetite, throat pain, pain or trouble swallowing, heartburn, nausea, vomiting, dry cough High potassium level--muscle weakness, fast or irregular heartbeat Stomach bleeding--bloody or black, tar-like stools, vomiting blood or brown material that looks like coffee grounds Side effects that usually do not require  medical attention (report to your care team if they continue or are bothersome): Diarrhea Gas Nausea Stomach pain Vomiting This list may not describe all possible side effects. Call your doctor for medical advice about side effects. You may  report side effects to FDA at 1-800-FDA-1088. Where should I keep my medication? Keep out of the reach of children. Store at room temperature between 15 and 30 degrees C (59 and 86 degrees F ). Keep bottle closed tightly to protect this medication from light and moisture. Throw away any unused medication after the expiration date. NOTE: This sheet is a summary. It may not cover all possible information. If you have questions about this medicine, talk to your doctor, pharmacist, or health care provider.  2024 Elsevier/Gold Standard (2022-03-20 00:00:00) [facility] - A DEPT OF [facility]   Discharge Instructions: Thank you for choosing [facility] to provide your oncology and hematology care.   If you have a lab appointment with the Cancer Center, please go directly to the Cancer Center and check in at the registration area.   Wear comfortable clothing and clothing appropriate for easy access to any Portacath or PICC line.   We strive to give you quality time with your provider. You may need to reschedule your appointment if you arrive late (15 or more minutes).  Arriving late affects you and other patients whose appointments are after yours.  Also, if you miss three or more appointments without notifying the office, you may be dismissed from the clinic at the provider's discretion.      For prescription refill requests, have your pharmacy contact our office and allow 72 hours for refills to be completed.    Today you received the following chemotherapy and/or immunotherapy agents Gemzar      To help prevent nausea and vomiting after your treatment, we encourage you to take your nausea medication as directed.  BELOW ARE SYMPTOMS THAT SHOULD BE REPORTED IMMEDIATELY: *FEVER GREATER THAN 100.4 F (38 C) OR HIGHER *CHILLS OR SWEATING *NAUSEA AND VOMITING THAT IS NOT CONTROLLED WITH YOUR NAUSEA MEDICATION *UNUSUAL SHORTNESS OF BREATH *UNUSUAL  BRUISING OR BLEEDING *URINARY PROBLEMS (pain or burning when urinating, or frequent urination) *BOWEL PROBLEMS (unusual diarrhea, constipation, pain near the anus) TENDERNESS IN MOUTH AND THROAT WITH OR WITHOUT PRESENCE OF ULCERS (sore throat, sores in mouth, or a toothache) UNUSUAL RASH, SWELLING OR PAIN  UNUSUAL VAGINAL DISCHARGE OR ITCHING   Items with * indicate a potential emergency and should be followed up as soon as possible or go to the Emergency Department if any problems should occur.  Please show the CHEMOTHERAPY ALERT CARD or IMMUNOTHERAPY ALERT CARD at check-in to the Emergency Department and triage nurse.  Should you have questions after your visit or need to cancel or reschedule your appointment, please contact [facility] - A DEPT OF [facility]  Dept: [phone]  and follow the prompts.  Office hours are 8:00 a.m. to 4:30 p.m. Monday - Friday. Please note that voicemails left after 4:00 p.m. may not be returned until the following business day.  We are closed weekends and major holidays. You have access to a nurse at all times for urgent questions. Please call the main number to the clinic Dept: [phone] and follow the prompts.   For any non-urgent questions, you may also contact your provider using MyChart. We now offer e-Visits for anyone 60 and older to request care online  for non-urgent symptoms. For details visit [email or web address].   Also download the MyChart app! Go to the app store, search "MyChart", open the app, select [facility], and log in with your MyChart username and password.  Gemcitabine Injection What is this medication? GEMCITABINE (jem SYE ta been) treats some types of cancer. It works by slowing down the growth of cancer cells. This medicine may be used for other purposes; ask your health care provider or pharmacist if you have questions. COMMON BRAND NAME(S): Gemzar, Infugem What should I tell my care team  before I take this medication? They need to know if you have any of these conditions: Blood disorders Infection Kidney disease Liver disease Lung or breathing disease, such as asthma or COPD Recent or ongoing radiation therapy An unusual or allergic reaction to gemcitabine, other medications, foods, dyes, or preservatives If you or your partner are pregnant or trying to get pregnant Breast-feeding How should I use this medication? This medication is injected into a vein. It is given by your care team in a hospital or clinic setting. Talk to your care team about the use of this medication in children. Special care may be needed. Overdosage: If you think you have taken too much of this medicine contact a poison control center or emergency room at once. NOTE: This medicine is only for you. Do not share this medicine with others. What if I miss a dose? Keep appointments for follow-up doses. It is important not to miss your dose. Call your care team if you are unable to keep an appointment. What may interact with this medication? Interactions have not been studied. This list may not describe all possible interactions. Give your health care provider a list of all the medicines, herbs, non-prescription drugs, or dietary supplements you use. Also tell them if you smoke, drink alcohol, or use illegal drugs. Some items may interact with your medicine. What should I watch for while using this medication? Your condition will be monitored carefully while you are receiving this medication. This medication may make you feel generally unwell. This is not uncommon, as chemotherapy can affect healthy cells as well as cancer cells. Report any side effects. Continue your course of treatment even though you feel ill unless your care team tells you to stop. In some cases, you may be given additional medications to help with side effects. Follow all directions for their use. This medication may increase your risk  of getting an infection. Call your care team for advice if you get a fever, chills, sore throat, or other symptoms of a cold or flu. Do not treat yourself. Try to avoid being around people who are sick. This medication may increase your risk to bruise or bleed. Call your care team if you notice any unusual bleeding. Be careful brushing or flossing your teeth or using a toothpick because you may get an infection or bleed more easily. If you have any dental work done, tell your dentist you are receiving this medication. Avoid taking medications that contain aspirin, acetaminophen, ibuprofen, naproxen, or ketoprofen unless instructed by your care team. These medications may hide a fever. Talk to your care team if you or your partner wish to become pregnant or think you might be pregnant. This medication can cause serious birth defects if taken during pregnancy and for 6 months after the last dose. A negative pregnancy test is required before starting this medication. A reliable form of contraception is recommended while taking this medication and for 6  months after the last dose. Talk to your care team about effective forms of contraception. Do not father a child while taking this medication and for 3 months after the last dose. Use a condom while having sex during this time period. Do not breastfeed while taking this medication and for at least 1 week after the last dose. This medication may cause infertility. Talk to your care team if you are concerned about your fertility. What side effects may I notice from receiving this medication? Side effects that you should report to your care team as soon as possible: Allergic reactions--skin rash, itching, hives, swelling of the face, lips, tongue, or throat Capillary leak syndrome--stomach or muscle pain, unusual weakness or fatigue, feeling faint or lightheaded, decrease in the amount of urine, swelling of the ankles, hands, or feet, trouble  breathing Infection--fever, chills, cough, sore throat, wounds that don't heal, pain or trouble when passing urine, general feeling of discomfort or being unwell Liver injury--right upper belly pain, loss of appetite, nausea, light-colored stool, dark yellow or brown urine, yellowing skin or eyes, unusual weakness or fatigue Low red blood cell level--unusual weakness or fatigue, dizziness, headache, trouble breathing Lung injury--shortness of breath or trouble breathing, cough, spitting up blood, chest pain, fever Stomach pain, bloody diarrhea, pale skin, unusual weakness or fatigue, decrease in the amount of urine, which may be signs of hemolytic uremic syndrome Sudden and severe headache, confusion, change in vision, seizures, which may be signs of posterior reversible encephalopathy syndrome (PRES) Unusual bruising or bleeding Side effects that usually do not require medical attention (report to your care team if they continue or are bothersome): Diarrhea Drowsiness Hair loss Nausea Pain, redness, or swelling with sores inside the mouth or throat Vomiting This list may not describe all possible side effects. Call your doctor for medical advice about side effects. You may report side effects to FDA at 1-800-FDA-1088. Where should I keep my medication? This medication is given in a hospital or clinic. It will not be stored at home. NOTE: This sheet is a summary. It may not cover all possible information. If you have questions about this medicine, talk to your doctor, pharmacist, or health care provider.  2024 Elsevier/Gold Standard (2022-01-13 00:00:00)

## 2023-08-18 NOTE — Progress Notes (Signed)
Patient seen by Dr. Thornton Papas today  Vitals are within treatment parameters:Yes   Labs are within treatment parameters: No (Please specify and give further instructions.)  K+ 3.1--proceed Treatment plan has been signed: Yes   Per physician team, Patient is ready for treatment and there are NO modifications to the treatment plan. Will start oral KCl at home

## 2023-08-18 NOTE — Progress Notes (Signed)
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 Truett Perna.  Weight decreased and documented as 169 pounds 8 oz from 177 pounds 12.8 oz on Oct 30.  Labs include K 3.1 and Glucose 127.  Subject reports abdominal pain and "just feeling bad." He noticed decreased appetite. He doesn't think he was nauseated. He found it hard to eat. He slept a lot. He started feeling better this past Sunday and has increased his oral intake. He did drink some nutrition supplements during that time.  Nutrition Diagnosis: Unintended wt loss continues.  Intervention: Educated to try small amounts of food throughout the day and continue ONS to supplement oral intake.  Strive for weight maintenance. Educated on foods high in potassium and provided a fact sheet. Support provided.  Monitoring, Evaluation, Goals: Patient will increase calories and protein to minimize wt loss.  Will schedule follow up on Wednesday, Dec 11 with infusion.

## 2023-08-23 ENCOUNTER — Ambulatory Visit: Payer: Medicare Other | Attending: Nurse Practitioner

## 2023-08-23 ENCOUNTER — Encounter: Payer: Self-pay | Admitting: Oncology

## 2023-08-23 ENCOUNTER — Other Ambulatory Visit: Payer: Self-pay | Admitting: *Deleted

## 2023-08-23 DIAGNOSIS — M6281 Muscle weakness (generalized): Secondary | ICD-10-CM | POA: Insufficient documentation

## 2023-08-23 DIAGNOSIS — R262 Difficulty in walking, not elsewhere classified: Secondary | ICD-10-CM | POA: Insufficient documentation

## 2023-08-23 DIAGNOSIS — C241 Malignant neoplasm of ampulla of Vater: Secondary | ICD-10-CM | POA: Diagnosis not present

## 2023-08-23 DIAGNOSIS — M21371 Foot drop, right foot: Secondary | ICD-10-CM | POA: Diagnosis not present

## 2023-08-23 MED ORDER — POTASSIUM CHLORIDE CRYS ER 10 MEQ PO TBCR: 10.0000 meq | EXTENDED_RELEASE_TABLET | Freq: Two times a day (BID) | ORAL | 0 refills | Status: DC

## 2023-08-23 NOTE — Therapy (Signed)
OUTPATIENT PHYSICAL THERAPY  LOWER EXTREMITY ONCOLOGY TREATMENT  Patient Name: Charles Lawson MRN: [id number] DOB:Jul 04, 1941, 82 y.o., male Today's Date: 08/23/2023  END OF SESSION:  PT End of Session - 08/23/23 1059     Visit Number 6    Number of Visits 12    Date for PT Re-Evaluation 08/31/23    PT Start Time 1100    PT Stop Time 1150    PT Time Calculation (min) 50 min    Activity Tolerance Patient tolerated treatment well    Behavior During Therapy [facility] for tasks assessed/performed             Past Medical History:  Diagnosis Date   Allergic rhinitis, cause unspecified    Arthritis    Blood pressure elevated without history of HTN    BPH (benign prostatic hyperplasia)    Diverticulosis of colon    ED (erectile dysfunction)    Helicobacter pylori gastritis 04/2023   Tx quad tx and eradication documented neg stool ag 07/2023   History of basal cell carcinoma (BCC) excision    Hyperlipidemia    Impaired glucose tolerance 04/03/2013   Personal history of colonic polyps 06/07/2012   Past Surgical History:  Procedure Laterality Date   Quintella Reichert OSTEOTOMY Left 07/19/2020   Procedure: Quintella Reichert OSTEOTOMY;  Surgeon: Edwin Cap, DPM;  Location: [facility];  Service: Podiatry;  Laterality: Left;   BIOPSY  05/10/2023   Procedure: BIOPSY;  Surgeon: Iva Boop, MD;  Location: [facility];  Service: Gastroenterology;;   BIOPSY  05/12/2023   Procedure: BIOPSY;  Surgeon: Iva Boop, MD;  Location: [facility];  Service: Gastroenterology;;   Arbutus Leas Left 07/19/2020   Procedure: Delos Haring;  Surgeon: Edwin Cap, DPM;  Location: [facility];  Service: Podiatry;  Laterality: Left;   COLONOSCOPY  last one 10-06-2017  dr Leone Payor   ESOPHAGOGASTRODUODENOSCOPY N/A 05/12/2023   Procedure: ESOPHAGOGASTRODUODENOSCOPY (EGD);  Surgeon: Iva Boop, MD;  Location: [facility];  Service: Gastroenterology;  Laterality: N/A;    ESOPHAGOGASTRODUODENOSCOPY (EGD) WITH PROPOFOL N/A 05/10/2023   Procedure: ESOPHAGOGASTRODUODENOSCOPY (EGD) WITH PROPOFOL;  Surgeon: Iva Boop, MD;  Location: [facility];  Service: Gastroenterology;  Laterality: N/A;   HAMMER TOE SURGERY Left 07/19/2020   Procedure: HAMMER TOE CORRECTION;  Surgeon: Edwin Cap, DPM;  Location: [facility];  Service: Podiatry;  Laterality: Left;   INGUINAL HERNIA REPAIR Right 11-21-2008  @ [facility]   IR IMAGING GUIDED PORT INSERTION  06/18/2023   LUMBAR LAMINECTOMY  1974   METATARSAL OSTEOTOMY Left 07/19/2020   Procedure: METATARSAL OSTEOTOMY;  Surgeon: Edwin Cap, DPM;  Location: [facility];  Service: Podiatry;  Laterality: Left;   ORIF TOE FRACTURE Left 07/19/2020   Procedure: OPEN TREATMENT OF METARSAL PHALANGEAL JOINT DISLOCATION;  Surgeon: Edwin Cap, DPM;  Location: [facility];  Service: Podiatry;  Laterality: Left;  ANKLE BLOCK WITH THIGH TOURNIQUET   Patient Active Problem List   Diagnosis Date Noted   Depression 07/01/2023   Coronary artery calcification seen on CT scan 07/01/2023   Aortic atherosclerosis (HCC) 07/01/2023   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   Malnutrition of moderate degree 05/14/2023   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   Gastric outlet obstruction 05/10/2023   Duodenitis 05/08/2023   Thrombocytopenia (HCC) 05/08/2023   Epigastric pain 04/30/2023   Asthmatic bronchitis 10/09/2021   Impacted ear wax 10/09/2021   Vitamin D deficiency 06/23/2021   Sinus infection 07/29/2020  Hammertoe of left foot    Tailor's bunion of left foot    Deformity of metatarsal bone of left foot    Injury of plantar plate of left foot    Hallux interphalangeus, acquired, left    Blood pressure elevated without history of HTN 04/02/2020   Ingrown nail 06/16/2019   Acute conjunctivitis of right eye 12/13/2018   Acute pharyngitis 12/13/2018   Dizziness 06/14/2018   Left inguinal hernia  06/11/2017   Mass of left side of neck 06/11/2017   Rash and nonspecific skin eruption 05/25/2014   Impaired glucose tolerance 04/03/2013   Hives 03/10/2013   History of colonic polyps 06/07/2012   Fatigue 03/30/2012   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   Allergic rhinitis 12/29/2011   Arthritis    Hyperlipidemia 01/30/2010   BENIGN PROSTATIC HYPERTROPHY, MILD, HX OF 01/30/2010   Erectile dysfunction 01/26/2008    PCP: Oliver Barre, MD  REFERRING PROVIDER: Lonna Cobb, NP  REFERRING DIAG: Ampullary Cancer  THERAPY DIAG:  Ampullary carcinoma (HCC)  Foot drop, right  Muscle weakness (generalized)  Difficulty walking  ONSET DATE: 06/15/2023  Rationale for Evaluation and Treatment: Rehabilitation  SUBJECTIVE:                                                                                                                                                                                           SUBJECTIVE STATEMENT:   I had my chemo last Wednesday. I feel better today than I was last week. I had some chills last week. I'm trying to eat some. I think I am doing well with my walking. I haven't used the cane in over a week.   EVAL Pt reports he has an appt scheduled for an AFO for his right foot but wanted to come to therapy to have his balance and strength checked. He developed Foot drop right after he got out of the hospital.  He is presently unable to drive due to foot drop. He has no appetite and has no energy for about 3 days after his infusion, but otherwise he is doing well. I am doing pretty well I think. 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 Duodenal obstruction August 2024 secondary to #1 H. pylori gastritis 8/19/202 Pt developed Right foot drop.  Had lumbar MRI 07/16/2023. He has an appt scheduled for AFO    PAIN:  Are you having pain? No, just numbness in the right lower leg into the foot.  PRECAUTIONS: Fall risk;drop foot  RED FLAGS: None   WEIGHT BEARING RESTRICTIONS: No  FALLS:  Has patient fallen in last 6 months? No  LIVING ENVIRONMENT: Lives with: lives with their spouse Lives in: House/apartment Stairs: Yes; Internal: 13 steps; on left going up and External: 3 steps; none Has following equipment at home: Quad cane large base and Walker - 2 wheeled  OCCUPATION: retired  Human resources officer: enjoys exercising, pool  PRIOR LEVEL OF FUNCTION: Independent  PATIENT GOALS: improve ankle ROM, balance, be able to drive   OBJECTIVE: Note: Objective measures were completed at Evaluation unless otherwise noted.  COGNITION: Overall cognitive status: Within functional limits for tasks assessed   PALPATION: NT  OBSERVATIONS / OTHER ASSESSMENTS:ambulates with steppage gait due to Right foot drop  08/05/2023 PROM DF-10 with knee extended after stretching  SENSATION: Light touch: Deficits , numbness in right    POSTURE: forward head, rounded shoulders  LOWER EXTREMITY STRENGTH:  MMT Right eval  Hip flexion 5  Hip extension Able to bridge  Hip abduction 4+  Hip adduction   Hip internal rotation 4+  Hip external rotation 4+  Knee flexion 5  Knee extension 5  Ankle dorsiflexion trace  Ankle plantarflexion 3+  Ankle inversion 2-  Ankle eversion 1+  Great toe extension 0   (Blank rows = not tested)  MMT LEFT eval  Hip flexion 4+  Hip extension Can bridge  Hip abduction 4+  Hip adduction   Hip internal rotation 4  Hip external rotation 5  Knee flexion 5  Knee  extension 4+  Ankle dorsiflexion 4+  Ankle plantarflexion 5  Ankle inversion 4  Ankle eversion 4  Great toe extension     (Blank rows = not tested)  LYMPHEDEMA ASSESSMENTS:   SURGERY TYPE/DATE: 05/17/2023 Pancreatic Duodenectomy  NUMBER OF LYMPH NODES REMOVED: 3 he thinks  CHEMOTHERAPY: Yes currently  RADIATION:NO  HORMONE TREATMENT: NO  INFECTIONS: NO  FUNCTIONAL TESTS:  5 times sit to stand 16.75 sec  4 position balance test; able to maintain all but Right SLS for 10 sec  GAIT: Distance walked: steppage gait due to drop foot on right Assistive device utilized: Quad cane small base Level of assistance: modified independence Comments: Pt needed instruction to use cane in his left UE, and to place it squarly on the floor. Adjusted cane height and base so he could use properly on the left and practiced walking in the gym.    Outcome measure: LE functional Scale:18   TODAY'S TREATMENT:                                                                                                                                          DATE:  08/23/2023 Walked in clinic 5 min with emphasis on erect posture and arm swing Nu Step seat 10, UE 9, Lev 6 x 7 min 345 steps Marching with HH x 10 B, marching on ax x 10 no HH Ax feet together, eyes closed 2 x 25 sec Leg Press 70 #, back reclined 2 x 10 SLS done B x 4 reps ea Cable Rows 7 # 2 x 10  08/16/2023 Marching on floor x 10 No HH Marching on ax x 15 no HH rest Mini lunges x 10  B no HH 3 D hip B. With yellow band x 10 ea  Lateral band walks in bar with yellow band 6 lengths in bars Nu Step lev 6, seat 10, UE 9 x , 513 steps Sit to stand x 10 Partial step ups 8 in x 10 B SLS to failure x 4 Bilaterally  08/11/2023 AFO is padded and appears to fit well, but was cautioned to wear for short periods and check his skin to be sure there is no irritation NuSTep Lev 5, seat 10, UE 8, x 11 min  583 steps Mini squats x 15 Step  ups on airex x 10 ea forward and lateral, no HH Marching on ax x 15 no HH rest Mini lunges x 10 light HH  Cable machine 7 # 2 x 10 for scapular retraction  seated rest Pt requested to be done; his stomach was bothering him  08/09/2023 NuSTep Lev 4, seat 10, UE 8, x 11 in  642 steps Mini squats x 15 with HH Mini lunges x 10 ea side with HH Marching in place x 15 no HH Sidestepping 3 lengths in bars, rest and H20, no HH Cable machine; scapular retraction 7# 2 x 10, biceps curls 7 # 2 x 10 Sit to stand  without UE'sx 10 with emphasis on posture and slowly lowering Ambulated without cane cancer gym to ortho gym down and back 2x's without rest with emphasis on posture and head up Showed pt sitting heel taps to try at home to try and get some DF return   08/05/2023 NuStep Lev 4, seat 10, UE 8 x 8 min, 433 steps PROM right ankle 3 x 30, Ankle DF  PROM = -10 Gastrc stretch with leg supported by table 3 x 30 using strap Foot  placement on step no HH x 10 forward and sideways B Partial step ups 6 in x 10 forward and lateral B with light HH Lunge with foot on step to increase DF Standing marching x 10 B No HH, heel raises x 10 B Step ups on ax x10 B no HH Ax beam 4 lengths forward, 4 lengths sideways with intermittent HH seated rest Sit to stand from chair;no hands x 10 Mini squat light hold on bars x 10 Mini lunges x 10 B with light HH seated rest Updated HEP  07/20/2023 Educated pt in right gastroc stretch with strap and standing runners stretch at wall. Gave illustrated and written instructions. Also educated pt in proper way to ambulate with quad cane in his left UE with proper cane placement and the need to support Right LE. Also practiced some with SPC. He is getting his AFO next week.    PATIENT EDUCATION:  Access Code: [email or web address] URL: [email or web address] Date: 08/05/2023 Prepared by: Alvira Monday  Exercises - Step Up  - 1 x daily - 7 x weekly - 1 sets - 15  reps - Standing Knee Flexion Stretch on Step  -  1 x daily - 7 x weekly - 1 sets - 5 reps - 15 hold - Mini Squat  - 1 x daily - 7 x weekly - 1 sets - 10 reps - Mini Lunge  - 1 x daily - 7 x weekly - 13 sets - 10 reps Education details: Access Code: [email or web address] URL: [email or web address] Date: 07/20/2023 Prepared by: Alvira Monday Exercises above to be done with light hand hold on railing or counter Exercises - Seated Calf Stretch with Strap  - 2 x daily - 7 x weekly - 1 sets - 3 reps - 30 hold - Standing Gastroc Stretch  - 1 x daily - 7 x weekly - 1 sets - 3 reps - 30 hold Person educated: Patient Education method: Explanation, Demonstration, and Handouts Education comprehension: returned demonstration and needs further education  HOME EXERCISE PROGRAM: Seated gastroc stretch with strap, standing runners stretch  ASSESSMENT:  CLINICAL IMPRESSION: Pt has met STG 1,3 and 5. He is walking with improved gait without AD, and is able to maintain tandem stance with right foot behind for 30 seconds today. He continues to experience fatigue due to his cancer treatments and he is eating very little. He has lost over 30 lbs. He is getting improved activation of  right DF. He is not as compliant with his HEP due to not feeling well, but has been advised to do 1 set of 10 for 4 exs.   OBJECTIVE IMPAIRMENTS: Abnormal gait, decreased activity tolerance, decreased balance, decreased knowledge of condition, decreased ROM, impaired sensation, and postural dysfunction.   ACTIVITY LIMITATIONS: carrying, standing, stairs, and locomotion level  PARTICIPATION LIMITATIONS: driving, shopping, community activity, and yard work  PERSONAL FACTORS: Age and 1-2 comorbidities: Cancer, chemo, foot drop  are also affecting patient's functional outcome.   REHAB POTENTIAL: Good  CLINICAL DECISION MAKING: Stable/uncomplicated  EVALUATION COMPLEXITY: Low   GOALS: Goals reviewed with patient?  Yes  SHORT TERM GOALS    Target date: 08/10/2023  Pt will  walk with more normal gait pattern utilizing an AFO Baseline: Goal status: MET, 08/23/2023  2.  Pt will improve LEFS to atleast 35/80 for improved function Baseline:  Goal status: INITIAL  3.  Pt will be able to maintain tandem stance with right foot behind for 15 seconds to decrease fall risk Baseline:  Goal status: MET 08/23/2023 4.  Pt will be independent with HEP for improved strength, balance and ankle ROM Baseline:  Goal status: in Progress; hasn't been feeling well  5. Pt will be able to resume driving with AFO GOAL status;MET 08/23/2023  LONG TERM GOALS: Target date: 08/31/2023  Pt will improve 5 time sit to stand test to no greater than 14.8 seconds to decrease fall risk Baseline:  Goal status: INITIAL  2.  Pt will have LEFS increased to 45/80 for improved function Baseline:  Goal status: INITIAL  3.  Pt will have right ankle PROM maintained at 5-8 degrees for improved function Baseline:  Goal status: INITIAL  4.  Pt will ambulate safely  with near normal gait without AD using AFO Baseline:  Goal status:MET 08/23/2023 5.  Pt will be able to go up and down stairs with near normal gait using hand rail Baseline:  Goal status: INITIAL 6.  Pt will have right SLS 10 seconds with AFO  Baseline 5 sec   PLAN:  PT FREQUENCY: 2x/week  PT DURATION: 6 weeks  PLANNED INTERVENTIONS: 97164- PT Re-evaluation, 97110-Therapeutic exercises, 54098- Neuromuscular re-education, 97535- Self Care, 11914-  Manual therapy, [phone]- Gait training, Patient/Family education, Balance training, Joint mobilization, Therapeutic exercises, Neuromuscular re-education, Gait training, and Self Care, aquatic therapy  PLAN FOR NEXT SESSION: gait training with AD prn, NU step, balance, step ups, review gastroc stretches, generalized strength, Fxl activities   Waynette Buttery, PT 08/23/2023, 11:54 AM

## 2023-08-24 ENCOUNTER — Telehealth: Payer: Self-pay

## 2023-08-24 NOTE — Telephone Encounter (Signed)
The spouse of the patient contacted [place] and indicated that the [facility] has not received the necessary information from our office. She was unsure about the specific requirements, so I took the initiative to fax the most recent office note to the [facility].

## 2023-08-25 ENCOUNTER — Ambulatory Visit: Payer: Medicare Other

## 2023-08-25 DIAGNOSIS — R262 Difficulty in walking, not elsewhere classified: Secondary | ICD-10-CM | POA: Diagnosis not present

## 2023-08-25 DIAGNOSIS — M21371 Foot drop, right foot: Secondary | ICD-10-CM | POA: Diagnosis not present

## 2023-08-25 DIAGNOSIS — C241 Malignant neoplasm of ampulla of Vater: Secondary | ICD-10-CM

## 2023-08-25 DIAGNOSIS — M6281 Muscle weakness (generalized): Secondary | ICD-10-CM

## 2023-08-25 NOTE — Therapy (Signed)
OUTPATIENT PHYSICAL THERAPY  LOWER EXTREMITY ONCOLOGY TREATMENT  Patient Name: Charles Lawson MRN: [id number] DOB:02/15/1941, 82 y.o., male Today's Date: 08/25/2023  END OF SESSION:  PT End of Session - 08/25/23 1100     Visit Number 7    Number of Visits 12    Date for PT Re-Evaluation 08/31/23    PT Start Time 1101    PT Stop Time 1153    PT Time Calculation (min) 52 min    Activity Tolerance Patient tolerated treatment well    Behavior During Therapy [facility] for tasks assessed/performed             Past Medical History:  Diagnosis Date   Allergic rhinitis, cause unspecified    Arthritis    Blood pressure elevated without history of HTN    BPH (benign prostatic hyperplasia)    Diverticulosis of colon    ED (erectile dysfunction)    Helicobacter pylori gastritis 04/2023   Tx quad tx and eradication documented neg stool ag 07/2023   History of basal cell carcinoma (BCC) excision    Hyperlipidemia    Impaired glucose tolerance 04/03/2013   Personal history of colonic polyps 06/07/2012   Past Surgical History:  Procedure Laterality Date   Quintella Reichert OSTEOTOMY Left 07/19/2020   Procedure: Quintella Reichert OSTEOTOMY;  Surgeon: Edwin Cap, DPM;  Location: [facility] [place];  Service: Podiatry;  Laterality: Left;   BIOPSY  05/10/2023   Procedure: BIOPSY;  Surgeon: Iva Boop, MD;  Location: [facility];  Service: Gastroenterology;;   BIOPSY  05/12/2023   Procedure: BIOPSY;  Surgeon: Iva Boop, MD;  Location: [facility];  Service: Gastroenterology;;   Arbutus Leas Left 07/19/2020   Procedure: Delos Haring;  Surgeon: Edwin Cap, DPM;  Location: [facility];  Service: Podiatry;  Laterality: Left;   COLONOSCOPY  last one 10-06-2017  dr Leone Payor   ESOPHAGOGASTRODUODENOSCOPY N/A 05/12/2023   Procedure: ESOPHAGOGASTRODUODENOSCOPY (EGD);  Surgeon: Iva Boop, MD;  Location: [facility];  Service: Gastroenterology;  Laterality: N/A;    ESOPHAGOGASTRODUODENOSCOPY (EGD) WITH PROPOFOL N/A 05/10/2023   Procedure: ESOPHAGOGASTRODUODENOSCOPY (EGD) WITH PROPOFOL;  Surgeon: Iva Boop, MD;  Location: [facility];  Service: Gastroenterology;  Laterality: N/A;   HAMMER TOE SURGERY Left 07/19/2020   Procedure: HAMMER TOE CORRECTION;  Surgeon: Edwin Cap, DPM;  Location: [facility];  Service: Podiatry;  Laterality: Left;   INGUINAL HERNIA REPAIR Right 11-21-2008  @ [facility]   IR IMAGING GUIDED PORT INSERTION  06/18/2023   LUMBAR LAMINECTOMY  1974   METATARSAL OSTEOTOMY Left 07/19/2020   Procedure: METATARSAL OSTEOTOMY;  Surgeon: Edwin Cap, DPM;  Location: [facility] [place];  Service: Podiatry;  Laterality: Left;   ORIF TOE FRACTURE Left 07/19/2020   Procedure: OPEN TREATMENT OF METARSAL PHALANGEAL JOINT DISLOCATION;  Surgeon: Edwin Cap, DPM;  Location: [facility];  Service: Podiatry;  Laterality: Left;  ANKLE BLOCK WITH THIGH TOURNIQUET   Patient Active Problem List   Diagnosis Date Noted   Depression 07/01/2023   Coronary artery calcification seen on CT scan 07/01/2023   Aortic atherosclerosis (HCC) 07/01/2023   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   Malnutrition of moderate degree 05/14/2023   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   Gastric outlet obstruction 05/10/2023   Duodenitis 05/08/2023   Thrombocytopenia (HCC) 05/08/2023   Epigastric pain 04/30/2023   Asthmatic bronchitis 10/09/2021   Impacted ear wax 10/09/2021   Vitamin D deficiency 06/23/2021   Sinus infection 07/29/2020  Hammertoe of left foot    Tailor's bunion of left foot    Deformity of metatarsal bone of left foot    Injury of plantar plate of left foot    Hallux interphalangeus, acquired, left    Blood pressure elevated without history of HTN 04/02/2020   Ingrown nail 06/16/2019   Acute conjunctivitis of right eye 12/13/2018   Acute pharyngitis 12/13/2018   Dizziness 06/14/2018   Left inguinal hernia  06/11/2017   Mass of left side of neck 06/11/2017   Rash and nonspecific skin eruption 05/25/2014   Impaired glucose tolerance 04/03/2013   Hives 03/10/2013   History of colonic polyps 06/07/2012   Fatigue 03/30/2012   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   Allergic rhinitis 12/29/2011   Arthritis    Hyperlipidemia 01/30/2010   BENIGN PROSTATIC HYPERTROPHY, MILD, HX OF 01/30/2010   Erectile dysfunction 01/26/2008    PCP: Oliver Barre, MD  REFERRING PROVIDER: Lonna Cobb, NP  REFERRING DIAG: Ampullary Cancer  THERAPY DIAG:  Ampullary carcinoma (HCC)  Foot drop, right  Muscle weakness (generalized)  Difficulty walking  ONSET DATE: 06/15/2023  Rationale for Evaluation and Treatment: Rehabilitation  SUBJECTIVE:                                                                                                                                                                                           SUBJECTIVE STATEMENT:    Feeling a little better today. Did OK after last visit. I drove myself this am with the AFO. The numbness in my right leg is better and I can move my foot better  EVAL Pt reports he has an appt scheduled for an AFO for his right foot but wanted to come to therapy to have his balance and strength checked. He developed Foot drop right after he got out of the hospital.  He is presently unable to drive due to foot drop. He has no appetite and has no energy for about 3 days after his infusion, but otherwise he is doing well. I am doing pretty well I think. 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 Duodenal obstruction August 2024 secondary to #1 H. pylori gastritis 8/19/202 Pt developed Right foot drop. Had lumbar MRI 07/16/2023. He has an appt scheduled for AFO  PAIN:  Are you having pain? No, just numbness in the right lower leg into the foot.  PRECAUTIONS: Fall risk;drop foot  RED FLAGS: None   WEIGHT BEARING RESTRICTIONS: No  FALLS:  Has patient fallen in last 6 months? No  LIVING ENVIRONMENT: Lives with: lives with their spouse Lives in: House/apartment Stairs: Yes; Internal: 13 steps; on left going up and External: 3 steps; none Has following equipment at home: Quad cane large base and Walker - 2 wheeled  OCCUPATION: retired  Human resources officer: enjoys exercising, pool  PRIOR LEVEL OF FUNCTION: Independent  PATIENT GOALS: improve ankle ROM, balance, be able to drive   OBJECTIVE: Note: Objective measures were completed at Evaluation unless otherwise noted.  COGNITION: Overall cognitive status: Within functional limits for tasks assessed   PALPATION: NT  OBSERVATIONS / OTHER ASSESSMENTS:ambulates with steppage gait due to Right foot drop  08/05/2023 PROM DF-10 with knee extended after stretching  SENSATION: Light touch: Deficits , numbness in right    POSTURE: forward head, rounded shoulders  LOWER EXTREMITY STRENGTH:  MMT Right eval  Hip flexion 5  Hip extension Able to bridge  Hip abduction 4+  Hip adduction   Hip internal rotation 4+  Hip external rotation 4+  Knee flexion 5  Knee extension 5  Ankle dorsiflexion trace  Ankle plantarflexion 3+  Ankle inversion 2-  Ankle eversion 1+  Great toe extension 0   (Blank rows = not tested)  MMT LEFT eval  Hip flexion 4+  Hip extension Can bridge  Hip abduction 4+  Hip adduction   Hip internal rotation 4  Hip external rotation 5  Knee flexion 5  Knee extension 4+  Ankle dorsiflexion  4+  Ankle plantarflexion 5  Ankle inversion 4  Ankle eversion 4  Great toe extension     (Blank rows = not tested)  LYMPHEDEMA ASSESSMENTS:   SURGERY TYPE/DATE: 05/17/2023 Pancreatic Duodenectomy  NUMBER OF LYMPH NODES REMOVED: 3 he thinks  CHEMOTHERAPY: Yes currently  RADIATION:NO  HORMONE TREATMENT: NO  INFECTIONS: NO  FUNCTIONAL TESTS:  5 times sit to stand 16.75 sec  4 position balance test; able to maintain all but Right SLS for 10 sec  GAIT: Distance walked: steppage gait due to drop foot on right Assistive device utilized: Quad cane small base Level of assistance: modified independence Comments: Pt needed instruction to use cane in his left UE, and to place it squarly on the floor. Adjusted cane height and base so he could use properly on the left and practiced walking in the gym.    Outcome measure: LE functional Scale:18   TODAY'S TREATMENT:                                                                                                                                          DATE:   08/25/2023 Nu Step seat 10, UE 9, Lev 6 x 7 min  556 steps Mini squats on ax x 15, light HHD Mini lunges x 10 ea side with light HH Marching on ax x10 ea no HH Vector reaches x 6 ea no HH 8 in step, partial step ups x 12 ea Amb. 3 min in Cancer gym x 3 min emphasis on lengthening stride Gastroc stretch with strap with right leg extended on table 3 x 30 sec Incline stretch at paralel l bars 3 x 30 08/23/2023 Walked in clinic 5 min with emphasis on erect posture and arm swing Nu Step seat 10, UE 9, Lev 6 x 7 min 345 steps Marching with HH x 10 B, marching on ax x 10 no HH Ax feet together, eyes closed 2 x 25 sec Leg Press 70 #, back reclined 2 x 10 SLS done B x 4 reps ea Cable Rows 7 # 2 x 10  08/16/2023 Marching on floor x 10 No HH Marching on ax x 15 no HH rest Mini lunges x 10  B no HH 3 D hip B. With yellow band x 10 ea  Lateral band walks in bar with yellow band 6  lengths in bars Nu Step lev 6, seat 10, UE 9 x , 513 steps Sit to stand x 10 Partial step ups 8 in x 10 B SLS to failure x 4 Bilaterally  08/11/2023 AFO is padded and appears to fit well, but was cautioned to wear for short periods and check his skin to be sure there is no irritation NuSTep Lev 5, seat 10, UE 8, x 11 min  583 steps Mini squats x 15 Step ups on airex x 10 ea forward and lateral, no HH Marching on ax x 15 no HH rest Mini lunges x 10 light HH  Cable machine 7 # 2 x 10 for scapular retraction  seated rest Pt requested to be done; his stomach was bothering him  08/09/2023 NuSTep Lev 4, seat 10, UE 8, x 11 in  642 steps Mini squats x 15 with HH Mini lunges x 10 ea side with HH Marching in place x 15 no HH Sidestepping 3 lengths in bars, rest and H20, no HH Cable machine; scapular retraction 7# 2 x 10, biceps curls 7 # 2 x 10 Sit to stand  without UE'sx 10 with emphasis on posture and slowly lowering Ambulated without cane cancer gym to ortho gym down and back 2x's without rest with emphasis on posture and head up Showed pt sitting heel taps to try at home to try and get some DF return   08/05/2023 NuStep Lev 4, seat 10, UE 8 x 8 min, 433 steps PROM right ankle 3 x 30, Ankle DF  PROM = -10 Gastrc stretch with leg supported by table 3 x 30 using strap Foot  placement on step no HH x 10 forward and sideways B Partial step ups 6 in x 10 forward and lateral B with light HH Lunge with foot on step to increase DF Standing marching x 10 B No HH, heel raises x 10 B Step ups on ax x10 B no HH Ax beam 4 lengths forward, 4 lengths sideways with intermittent HH seated rest Sit to stand from chair;no hands x 10 Mini squat light hold on bars x 10 Mini lunges x 10 B with light HH seated rest Updated HEP  07/20/2023 Educated pt in right gastroc stretch with strap and standing runners stretch at wall. Gave illustrated and written instructions. Also educated pt in proper way  to ambulate with quad cane in his left UE with proper cane placement and the need to support Right LE. Also practiced some with SPC. He is getting his AFO next week.    PATIENT EDUCATION:  Access Code: [email or web address] URL: [email or web address] Date: 08/05/2023 Prepared by: Alvira Monday  Exercises - Step Up  - 1 x daily - 7 x weekly - 1 sets - 15 reps - Standing Knee Flexion Stretch on Step  - 1 x daily - 7 x weekly - 1 sets - 5 reps - 15 hold - Mini Squat  - 1 x daily - 7 x weekly - 1 sets - 10 reps - Mini Lunge  - 1 x daily - 7 x weekly - 13 sets - 10 reps Education details: Access Code: [email or web address] URL: [email or web address] Date: 07/20/2023 Prepared by: Alvira Monday Exercises above to be done with light hand hold on railing or counter Exercises - Seated Calf Stretch with Strap  - 2 x daily - 7 x weekly - 1 sets - 3 reps - 30 hold - Standing Gastroc Stretch  - 1 x daily - 7 x weekly - 1 sets - 3 reps - 30 hold Person educated: Patient Education method: Explanation, Demonstration, and Handouts Education comprehension: returned demonstration and needs further education  HOME EXERCISE PROGRAM: Seated gastroc stretch with strap, standing runners stretch  ASSESSMENT:  CLINICAL IMPRESSION:  Pt is feeling better today and required only 1 rest break. He is getting some return in the Right ankle DF, and his right leg is no longer numb. He is due for recert/DC next week. He met his sit to stand goal today.   OBJECTIVE IMPAIRMENTS: Abnormal gait, decreased activity tolerance, decreased balance, decreased knowledge of condition, decreased ROM, impaired sensation, and postural dysfunction.   ACTIVITY LIMITATIONS: carrying, standing, stairs, and locomotion level  PARTICIPATION LIMITATIONS: driving, shopping, community activity, and yard work  PERSONAL FACTORS: Age and 1-2 comorbidities: Cancer, chemo, foot drop  are also affecting patient's functional outcome.    REHAB POTENTIAL: Good  CLINICAL DECISION MAKING: Stable/uncomplicated  EVALUATION COMPLEXITY: Low   GOALS: Goals reviewed with patient? Yes  SHORT TERM GOALS    Target date: 08/10/2023  Pt will  walk with more normal gait pattern utilizing an AFO Baseline: Goal status: MET, 08/23/2023  2.  Pt will improve LEFS to atleast 35/80 for improved function Baseline:  Goal status: INITIAL  3.  Pt will be able to maintain tandem stance with right foot behind for 15 seconds to decrease fall risk Baseline:  Goal status: MET 08/23/2023 4.  Pt will be independent with HEP for improved strength, balance and ankle ROM Baseline:  Goal status: in Progress; hasn't been feeling well  5. Pt will be able to resume driving with AFO GOAL status;MET 08/23/2023  LONG TERM GOALS: Target date: 08/31/2023  Pt will improve 5 time sit to stand test to no greater than 14.8 seconds to decrease fall risk Baseline:  Goal status: MET 08/25/2023 14.44 second 2.  Pt will have LEFS increased to 45/80 for improved function Baseline:  Goal status: INITIAL  3.  Pt will have right ankle PROM maintained at 5-8 degrees for improved function Baseline:  Goal status: INITIAL  4.  Pt will ambulate safely  with near normal gait without AD using AFO Baseline:  Goal status:MET 08/23/2023 5.  Pt will be able to go up and down stairs with near normal gait using hand rail Baseline:  Goal status: INITIAL 6.  Pt will  have right SLS 10 seconds with AFO  Baseline 5 sec   PLAN:  PT FREQUENCY: 2x/week  PT DURATION: 6 weeks  PLANNED INTERVENTIONS: 16109- PT Re-evaluation, 97110-Therapeutic exercises, 97112- Neuromuscular re-education, 97535- Self Care, [id number]- Manual therapy, [phone]- Gait training, Patient/Family education, Balance training, Joint mobilization, Therapeutic exercises, Neuromuscular re-education, Gait training, and Self Care, aquatic therapy  PLAN FOR NEXT SESSION:, Recert/DC,NU step, balance, step ups,  review gastroc stretches, generalized strength, Fxl activities   Waynette Buttery, PT 08/25/2023, 11:54 AM

## 2023-08-26 ENCOUNTER — Telehealth: Payer: Self-pay

## 2023-08-26 NOTE — Telephone Encounter (Signed)
I have sent the prescription for the AFO (ankle-foot orthosis) prefabricated along with the office note to [facility] at [phone] via fax.

## 2023-08-29 ENCOUNTER — Other Ambulatory Visit: Payer: Self-pay | Admitting: Oncology

## 2023-08-30 ENCOUNTER — Ambulatory Visit: Payer: Medicare Other

## 2023-08-30 DIAGNOSIS — C241 Malignant neoplasm of ampulla of Vater: Secondary | ICD-10-CM

## 2023-08-30 DIAGNOSIS — M6281 Muscle weakness (generalized): Secondary | ICD-10-CM

## 2023-08-30 DIAGNOSIS — R262 Difficulty in walking, not elsewhere classified: Secondary | ICD-10-CM

## 2023-08-30 DIAGNOSIS — M21371 Foot drop, right foot: Secondary | ICD-10-CM | POA: Diagnosis not present

## 2023-08-30 NOTE — Therapy (Signed)
OUTPATIENT PHYSICAL THERAPY  LOWER EXTREMITY ONCOLOGY TREATMENT  Patient Name: Charles Lawson MRN: [id number] DOB:[birth year], 82 y.o., male Today's Date: 08/30/2023  END OF SESSION:  PT End of Session - 08/30/23 1057     Visit Number 8    Number of Visits 12    Date for PT Re-Evaluation 08/31/23    PT Start Time 1005    PT Stop Time 1057    PT Time Calculation (min) 52 min    Activity Tolerance Patient tolerated treatment well    Behavior During Therapy [facility] for tasks assessed/performed             Past Medical History:  Diagnosis Date   Allergic rhinitis, cause unspecified    Arthritis    Blood pressure elevated without history of HTN    BPH (benign prostatic hyperplasia)    Diverticulosis of colon    ED (erectile dysfunction)    Helicobacter pylori gastritis 04/2023   Tx quad tx and eradication documented neg stool ag 07/2023   History of basal cell carcinoma (BCC) excision    Hyperlipidemia    Impaired glucose tolerance 04/03/2013   Personal history of colonic polyps 06/07/2012   Past Surgical History:  Procedure Laterality Date   Quintella Reichert OSTEOTOMY Left 07/19/2020   Procedure: Quintella Reichert OSTEOTOMY;  Surgeon: Edwin Cap, DPM;  Location: [facility];  Service: Podiatry;  Laterality: Left;   BIOPSY  05/10/2023   Procedure: BIOPSY;  Surgeon: Iva Boop, MD;  Location: [facility];  Service: Gastroenterology;;   BIOPSY  05/12/2023   Procedure: BIOPSY;  Surgeon: Iva Boop, MD;  Location: [facility];  Service: Gastroenterology;;   Arbutus Leas Left 07/19/2020   Procedure: Delos Haring;  Surgeon: Edwin Cap, DPM;  Location: [facility];  Service: Podiatry;  Laterality: Left;   COLONOSCOPY  last one 10-06-2017  dr Leone Payor   ESOPHAGOGASTRODUODENOSCOPY N/A 05/12/2023   Procedure: ESOPHAGOGASTRODUODENOSCOPY (EGD);  Surgeon: Iva Boop, MD;  Location: [facility];  Service: Gastroenterology;  Laterality: N/A;    ESOPHAGOGASTRODUODENOSCOPY (EGD) WITH PROPOFOL N/A 05/10/2023   Procedure: ESOPHAGOGASTRODUODENOSCOPY (EGD) WITH PROPOFOL;  Surgeon: Iva Boop, MD;  Location: [facility];  Service: Gastroenterology;  Laterality: N/A;   HAMMER TOE SURGERY Left 07/19/2020   Procedure: HAMMER TOE CORRECTION;  Surgeon: Edwin Cap, DPM;  Location: [facility];  Service: Podiatry;  Laterality: Left;   INGUINAL HERNIA REPAIR Right 11-21-2008  @ [facility]   IR IMAGING GUIDED PORT INSERTION  06/18/2023   LUMBAR LAMINECTOMY  1974   METATARSAL OSTEOTOMY Left 07/19/2020   Procedure: METATARSAL OSTEOTOMY;  Surgeon: Edwin Cap, DPM;  Location: [facility];  Service: Podiatry;  Laterality: Left;   ORIF TOE FRACTURE Left 07/19/2020   Procedure: OPEN TREATMENT OF METARSAL PHALANGEAL JOINT DISLOCATION;  Surgeon: Edwin Cap, DPM;  Location: [facility];  Service: Podiatry;  Laterality: Left;  ANKLE BLOCK WITH THIGH TOURNIQUET   Patient Active Problem List   Diagnosis Date Noted   Depression 07/01/2023   Coronary artery calcification seen on CT scan 07/01/2023   Aortic atherosclerosis (HCC) 07/01/2023   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   Malnutrition of moderate degree 05/14/2023   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   Gastric outlet obstruction 05/10/2023   Duodenitis 05/08/2023   Thrombocytopenia (HCC) 05/08/2023   Epigastric pain 04/30/2023   Asthmatic bronchitis 10/09/2021   Impacted ear wax 10/09/2021   Vitamin D deficiency 06/23/2021   Sinus infection 07/29/2020  Hammertoe of left foot    Tailor's bunion of left foot    Deformity of metatarsal bone of left foot    Injury of plantar plate of left foot    Hallux interphalangeus, acquired, left    Blood pressure elevated without history of HTN 04/02/2020   Ingrown nail 06/16/2019   Acute conjunctivitis of right eye 12/13/2018   Acute pharyngitis 12/13/2018   Dizziness 06/14/2018   Left inguinal hernia  06/11/2017   Mass of left side of neck 06/11/2017   Rash and nonspecific skin eruption 05/25/2014   Impaired glucose tolerance 04/03/2013   Hives 03/10/2013   History of colonic polyps 06/07/2012   Fatigue 03/30/2012   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   Allergic rhinitis 12/29/2011   Arthritis    Hyperlipidemia 01/30/2010   BENIGN PROSTATIC HYPERTROPHY, MILD, HX OF 01/30/2010   Erectile dysfunction 01/26/2008    PCP: Oliver Barre, MD  REFERRING PROVIDER: Lonna Cobb, NP  REFERRING DIAG: Ampullary Cancer  THERAPY DIAG:  Ampullary carcinoma (HCC)  Foot drop, right  Muscle weakness (generalized)  Difficulty walking  ONSET DATE: 06/15/2023  Rationale for Evaluation and Treatment: Rehabilitation  SUBJECTIVE:                                                                                                                                                                                           SUBJECTIVE STATEMENT:   Feeling a little better today. I didn't wear my AFO. My wife still has vertigo so I am driving myself. Did well after last visit. Numbness in right leg has resolved.  EVAL Pt reports he has an appt scheduled for an AFO for his right foot but wanted to come to therapy to have his balance and strength checked. He developed Foot drop right after he got out of the hospital.  He is presently unable to drive due to foot drop. He has no appetite and has no energy for about 3 days after his infusion, but otherwise he is doing well. I am doing pretty well I think. 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 Duodenal obstruction August 2024 secondary to #1 H. pylori gastritis 8/19/202 Pt developed Right foot drop. Had lumbar MRI 07/16/2023. He has an appt scheduled for AFO  PAIN:  Are you having pain? No,   PRECAUTIONS: Fall risk;drop foot  RED FLAGS: None   WEIGHT BEARING RESTRICTIONS: No  FALLS:  Has patient fallen in last 6 months? No  LIVING ENVIRONMENT: Lives with: lives with their spouse Lives in: House/apartment Stairs: Yes; Internal: 13 steps; on left going up and External: 3 steps; none Has following equipment at home: Quad cane large base and Walker - 2 wheeled  OCCUPATION: retired  Human resources officer: enjoys exercising, pool  PRIOR LEVEL OF FUNCTION: Independent  PATIENT GOALS: improve ankle ROM, balance, be able to drive   OBJECTIVE: Note: Objective measures were completed at Evaluation unless otherwise noted.  COGNITION: Overall cognitive status: Within functional limits for tasks assessed   PALPATION: NT  OBSERVATIONS / OTHER ASSESSMENTS:ambulates with steppage gait due to Right foot drop  08/05/2023 PROM DF-10 with knee extended after stretching  SENSATION: Light touch: Deficits , numbness in right    POSTURE: forward head, rounded shoulders  LOWER EXTREMITY STRENGTH:  MMT Right eval  Hip flexion 5  Hip extension Able to bridge  Hip abduction 4+  Hip adduction   Hip internal rotation 4+  Hip external rotation 4+  Knee flexion 5  Knee extension 5  Ankle dorsiflexion trace  Ankle plantarflexion 3+  Ankle inversion 2-  Ankle eversion 1+  Great toe extension 0   (Blank rows = not tested)  MMT LEFT eval  Hip flexion 4+  Hip extension Can bridge  Hip abduction 4+  Hip adduction   Hip internal rotation 4  Hip external rotation 5  Knee flexion 5  Knee extension 4+  Ankle dorsiflexion 4+  Ankle plantarflexion 5  Ankle inversion 4   Ankle eversion 4  Great toe extension     (Blank rows = not tested)  LYMPHEDEMA ASSESSMENTS:   SURGERY TYPE/DATE: 05/17/2023 Pancreatic Duodenectomy  NUMBER OF LYMPH NODES REMOVED: 3 he thinks  CHEMOTHERAPY: Yes currently  RADIATION:NO  HORMONE TREATMENT: NO  INFECTIONS: NO  FUNCTIONAL TESTS:  5 times sit to stand 16.75 sec  4 position balance test; able to maintain all but Right SLS for 10 sec  GAIT: Distance walked: steppage gait due to drop foot on right Assistive device utilized: Quad cane small base Level of assistance: modified independence Comments: Pt needed instruction to use cane in his left UE, and to place it squarly on the floor. Adjusted cane height and base so he could use properly on the left and practiced walking in the gym.    Outcome measure: LE functional Scale:18   TODAY'S TREATMENT:                                                                                                                                          DATE  08/30/2023 Nu Step seat 11, UE 9, Lev 6 x 10 min 545 Incline stretch 3 x 30 sec to stretch gastroc  Mini squats on ax x 15, light HHD Mini lunges x 10 ea side with light HH Marching on ax x10 ea no HH Cable row 10# 2 x 10 Scapular retraction, shoulder ext, Bilateral ER x 10 with Green LEG Press 80 #, 3 x 10 Updated HEP with Tband exs Constant VC's required for proper posture  08/25/2023 Nu Step seat 10, UE 9, Lev 6 x 7 min 556 steps Mini squats on ax x 15, light HHD Mini lunges x 10 ea side with light HH Marching on ax x10 ea no HH Vector reaches x 6 ea no HH 8 in step, partial step ups x 12 ea Amb. 3 min in Cancer gym x 3 min emphasis on lengthening stride Gastroc stretch with strap with right leg extended on table 3 x 30 sec Incline stretch at paralel l bars 3 x 30 08/23/2023 Walked in clinic 5 min with emphasis on erect posture and arm swing Nu Step seat 10, UE 9, Lev 6 x 7 min 345 steps Marching with HH x 10 B,  marching on ax x 10 no HH Ax feet together, eyes closed 2 x 25 sec Leg Press 70 #, back reclined 2 x 10 SLS done B x 4 reps ea Cable Rows 7 # 2 x 10  08/16/2023 Marching on floor x 10 No HH Marching on ax x 15 no HH rest Mini lunges x 10  B no HH 3 D hip B. With yellow band x 10 ea  Lateral band walks in bar with yellow band 6 lengths in bars Nu Step lev 6, seat 10, UE 9 x , 513 steps Sit to stand x 10 Partial step ups 8 in x 10 B SLS to failure x 4 Bilaterally  08/11/2023 AFO is padded and appears to fit well, but was cautioned to wear for short periods and check his skin to be sure there is no irritation NuSTep Lev 5, seat 10, UE 8, x 11 min  583 steps Mini squats x 15 Step ups on airex x 10 ea forward and lateral, no HH Marching on ax x 15 no HH rest Mini lunges x 10 light HH  Cable machine 7 # 2 x 10 for scapular retraction  seated rest Pt requested to be done; his stomach was bothering him  08/09/2023 NuSTep Lev 4, seat 10, UE 8, x 11 in  642 steps Mini squats x 15 with HH Mini lunges x 10 ea side with HH Marching in place x 15 no HH Sidestepping 3 lengths in bars, rest and H20, no HH Cable machine; scapular retraction 7# 2 x 10, biceps curls 7 # 2 x 10 Sit to stand  without UE'sx 10 with emphasis on posture and slowly lowering Ambulated without cane cancer gym to ortho gym down and back 2x's without rest with emphasis on posture and head up Showed pt sitting heel taps to try at home to try and get some DF return   08/05/2023 NuStep Lev 4, seat 10, UE 8 x 8 min, 433 steps PROM right ankle 3 x 30, Ankle DF  PROM = -10 Gastrc stretch with leg supported by table 3 x 30 using strap Foot  placement on step no HH x 10 forward and sideways B Partial step ups 6 in x 10 forward and lateral B with light HH Lunge with foot on step to increase DF Standing marching x 10 B No HH, heel raises x 10 B Step ups on ax x10  B no HH Ax beam 4 lengths forward, 4 lengths sideways  with intermittent HH seated rest Sit to stand from chair;no hands x 10 Mini squat light hold on bars x 10 Mini lunges x 10 B with light HH seated rest Updated HEP  07/20/2023 Educated pt in right gastroc stretch with strap and standing runners stretch at wall. Gave illustrated and written instructions. Also educated pt in proper way to ambulate with quad cane in his left UE with proper cane placement and the need to support Right LE. Also practiced some with SPC. He is getting his AFO next week.    PATIENT EDUCATION:  Postural theraband exs with green band;Scapular retraction, extension, B ER Access Code: [email or web address] URL: [email or web address] Date: 08/05/2023 Prepared by: Alvira Monday  Exercises - Step Up  - 1 x daily - 7 x weekly - 1 sets - 15 reps - Standing Knee Flexion Stretch on Step  - 1 x daily - 7 x weekly - 1 sets - 5 reps - 15 hold - Mini Squat  - 1 x daily - 7 x weekly - 1 sets - 10 reps - Mini Lunge  - 1 x daily - 7 x weekly - 13 sets - 10 reps Education details: Access Code: [email or web address] URL: [email or web address] Date: 07/20/2023 Prepared by: Alvira Monday Exercises above to be done with light hand hold on railing or counter Exercises - Seated Calf Stretch with Strap  - 2 x daily - 7 x weekly - 1 sets - 3 reps - 30 hold - Standing Gastroc Stretch  - 1 x daily - 7 x weekly - 1 sets - 3 reps - 30 hold Person educated: Patient Education method: Explanation, Demonstration, and Handouts Education comprehension: returned demonstration and needs further education  HOME EXERCISE PROGRAM: Seated gastroc stretch with strap, standing runners stretch  ASSESSMENT:  CLINICAL IMPRESSION:  Pt is feeling better today and required only 1 rest break. Able to increase resistance on Leg press. He still requires constant VC's for proper posture as he is constantly in [facility] and looking down. Recert next visit   OBJECTIVE IMPAIRMENTS: Abnormal gait, decreased  activity tolerance, decreased balance, decreased knowledge of condition, decreased ROM, impaired sensation, and postural dysfunction.   ACTIVITY LIMITATIONS: carrying, standing, stairs, and locomotion level  PARTICIPATION LIMITATIONS: driving, shopping, community activity, and yard work  PERSONAL FACTORS: Age and 1-2 comorbidities: Cancer, chemo, foot drop  are also affecting patient's functional outcome.   REHAB POTENTIAL: Good  CLINICAL DECISION MAKING: Stable/uncomplicated  EVALUATION COMPLEXITY: Low   GOALS: Goals reviewed with patient? Yes  SHORT TERM GOALS    Target date: 08/10/2023  Pt will  walk with more normal gait pattern utilizing an AFO Baseline: Goal status: MET, 08/23/2023  2.  Pt will improve LEFS to atleast 35/80 for improved function Baseline:  Goal status: INITIAL  3.  Pt will be able to maintain tandem stance with right foot behind for 15 seconds to decrease fall risk Baseline:  Goal status: MET 08/23/2023 4.  Pt will be independent with HEP for improved strength, balance and ankle ROM Baseline:  Goal status: in Progress; hasn't been feeling well  5. Pt will be able to resume driving with AFO GOAL status;MET 08/23/2023  LONG TERM GOALS: Target date: 08/31/2023  Pt will improve 5 time sit to stand test to no greater than 14.8 seconds to decrease fall risk Baseline:  Goal status: MET 08/25/2023 14.44 second 2.  Pt will have LEFS increased to 45/80 for improved  function Baseline:  Goal status: INITIAL  3.  Pt will have right ankle PROM maintained at 5-8 degrees for improved function Baseline:  Goal status: INITIAL  4.  Pt will ambulate safely  with near normal gait without AD using AFO Baseline:  Goal status:MET 08/23/2023 5.  Pt will be able to go up and down stairs with near normal gait using hand rail Baseline:  Goal status: INITIAL 6.  Pt will have right SLS 10 seconds with AFO  Baseline 5 sec   PLAN:  PT FREQUENCY: 2x/week  PT  DURATION: 6 weeks  PLANNED INTERVENTIONS: 16109- PT Re-evaluation, 97110-Therapeutic exercises, 97112- Neuromuscular re-education, 97535- Self Care, 60454- Manual therapy, [id number]- Gait training, Patient/Family education, Balance training, Joint mobilization, Therapeutic exercises, Neuromuscular re-education, Gait training, and Self Care, aquatic therapy  PLAN FOR NEXT SESSION:, Recert/DC,NU step, balance, step ups, review gastroc stretches, generalized strength, Fxl activities   Waynette Buttery, PT 08/30/2023, 10:58 AM

## 2023-09-01 ENCOUNTER — Inpatient Hospital Stay: Payer: Medicare Other

## 2023-09-01 ENCOUNTER — Other Ambulatory Visit: Payer: Self-pay

## 2023-09-01 ENCOUNTER — Inpatient Hospital Stay: Payer: Medicare Other | Admitting: Oncology

## 2023-09-01 ENCOUNTER — Inpatient Hospital Stay: Payer: Medicare Other | Admitting: Nutrition

## 2023-09-01 ENCOUNTER — Inpatient Hospital Stay: Payer: Medicare Other | Attending: Nurse Practitioner

## 2023-09-01 VITALS — BP 119/71 | HR 95 | Temp 98.2°F | Resp 18 | Ht 71.0 in | Wt 170.1 lb

## 2023-09-01 VITALS — BP 117/63 | HR 74

## 2023-09-01 DIAGNOSIS — C241 Malignant neoplasm of ampulla of Vater: Secondary | ICD-10-CM

## 2023-09-01 DIAGNOSIS — Z5111 Encounter for antineoplastic chemotherapy: Secondary | ICD-10-CM | POA: Diagnosis not present

## 2023-09-01 LAB — CBC WITH DIFFERENTIAL (CANCER CENTER ONLY)
Abs Immature Granulocytes: 0.03 10*3/uL (ref 0.00–0.07)
Basophils Absolute: 0.1 10*3/uL (ref 0.0–0.1)
Basophils Relative: 1 %
Eosinophils Absolute: 0.4 10*3/uL (ref 0.0–0.5)
Eosinophils Relative: 5 %
HCT: 37.9 % — ABNORMAL LOW (ref 39.0–52.0)
Hemoglobin: 12.9 g/dL — ABNORMAL LOW (ref 13.0–17.0)
Immature Granulocytes: 0 %
Lymphocytes Relative: 30 %
Lymphs Abs: 2.4 10*3/uL (ref 0.7–4.0)
MCH: 32.4 pg (ref 26.0–34.0)
MCHC: 34 g/dL (ref 30.0–36.0)
MCV: 95.2 fL (ref 80.0–100.0)
Monocytes Absolute: 0.9 10*3/uL (ref 0.1–1.0)
Monocytes Relative: 12 %
Neutro Abs: 4.1 10*3/uL (ref 1.7–7.7)
Neutrophils Relative %: 52 %
Platelet Count: 325 10*3/uL (ref 150–400)
RBC: 3.98 MIL/uL — ABNORMAL LOW (ref 4.22–5.81)
RDW: 22.2 % — ABNORMAL HIGH (ref 11.5–15.5)
WBC Count: 7.9 10*3/uL (ref 4.0–10.5)
nRBC: 0 % (ref 0.0–0.2)

## 2023-09-01 LAB — CMP (CANCER CENTER ONLY)
ALT: 30 U/L (ref 0–44)
AST: 24 U/L (ref 15–41)
Albumin: 3.5 g/dL (ref 3.5–5.0)
Alkaline Phosphatase: 130 U/L — ABNORMAL HIGH (ref 38–126)
Anion gap: 6 (ref 5–15)
BUN: 12 mg/dL (ref 8–23)
CO2: 27 mmol/L (ref 22–32)
Calcium: 9 mg/dL (ref 8.9–10.3)
Chloride: 104 mmol/L (ref 98–111)
Creatinine: 0.81 mg/dL (ref 0.61–1.24)
GFR, Estimated: >60 mL/min (ref >60)
Glucose, Bld: 106 mg/dL — ABNORMAL HIGH (ref 70–99)
Potassium: 4.4 mmol/L (ref 3.5–5.1)
Sodium: 137 mmol/L (ref 135–145)
Total Bilirubin: 1 mg/dL (ref <1.2)
Total Protein: 6.6 g/dL (ref 6.5–8.1)

## 2023-09-01 MED ORDER — SODIUM CHLORIDE 0.9 % IV SOLN
Freq: Once | INTRAVENOUS | Status: AC
Start: 2023-09-01 — End: 2023-09-01

## 2023-09-01 MED ORDER — SODIUM CHLORIDE 0.9 % IV SOLN
2000.0000 mg | Freq: Once | INTRAVENOUS | Status: AC
  Administered 2023-09-01: 2000 mg via INTRAVENOUS
  Filled 2023-09-01: qty 52.6

## 2023-09-01 MED ORDER — HEPARIN SOD (PORK) LOCK FLUSH 100 UNIT/ML IV SOLN
500.0000 [IU] | Freq: Once | INTRAVENOUS | Status: AC | PRN
  Administered 2023-09-01: 500 [IU]

## 2023-09-01 MED ORDER — SODIUM CHLORIDE 0.9% FLUSH
10.0000 mL | INTRAVENOUS | Status: DC | PRN
  Administered 2023-09-01: 10 mL

## 2023-09-01 MED ORDER — PROCHLORPERAZINE MALEATE 10 MG PO TABS
10.0000 mg | ORAL_TABLET | Freq: Once | ORAL | Status: AC
  Administered 2023-09-01: 10 mg via ORAL
  Filled 2023-09-01: qty 1

## 2023-09-01 NOTE — Progress Notes (Signed)
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 Truett Perna.   Weight documented as 170 pounds 1.6 oz. from 169 pounds 8 oz on Nov 27.   Labs include and Glucose 106.   Patient denies mouth sores and diarrhea. He has felt better and noticed an increased appetite. Pleased with small weight gain. Continues to drink oral nutrition supplements. Wife preparing favorite foods.   Nutrition Diagnosis: Unintended wt loss stable.  Intervention: Educated to continue strategies for adequate calories and protein. Continue ONS to supplement oral intake.  Manage side effects per MD.   Monitoring, Evaluation, Goals: Patient will tolerate adequate calories and protein to minimize wt loss.  No follow up scheduled. Will monitor and have encouraged patient to reach out to RD with questions or concerns.

## 2023-09-01 NOTE — Progress Notes (Signed)
  [facility] OFFICE PROGRESS NOTE   Diagnosis: Ampullary carcinoma  INTERVAL HISTORY:   [name] returns as scheduled.  He began another cycle of capecitabine 08/20/2023.  He has noted improvement in the skin rash.  No mouth sores or diarrhea.  No hand or foot pain.  No fever or dyspnea.  Objective:  Vital signs in last 24 hours:  Blood pressure 119/71, pulse 95, temperature 98.2 F (36.8 C), temperature source Temporal, resp. rate 18, height 5\' 11"  (1.803 m), weight 170 lb 1.6 oz (77.2 kg), SpO2 99%.    HEENT: No thrush or ulcer Cardio: Regular rate and rhythm GI: No hepatosplenomegaly, nontender Vascular: No leg edema  Skin: Erythematous hyperpigmented rash over the trunk and forearms-improved, Palms without erythema, hyperpigmentation and superficial desquamation at the soles.  Linear ulcerations at the base of the great toe bilaterally  Portacath/PICC-without erythema  Lab Results:  Lab Results  Component Value Date   WBC 7.9 09/01/2023   HGB 12.9 (L) 09/01/2023   HCT 37.9 (L) 09/01/2023   MCV 95.2 09/01/2023   PLT 325 09/01/2023   NEUTROABS 4.1 09/01/2023    CMP  Lab Results  Component Value Date   NA 138 08/18/2023   K 3.1 (L) 08/18/2023   CL 102 08/18/2023   CO2 26 08/18/2023   GLUCOSE 127 (H) 08/18/2023   BUN 11 08/18/2023   CREATININE 0.69 08/18/2023   CALCIUM 9.0 08/18/2023   PROT 6.2 (L) 08/18/2023   ALBUMIN 3.5 08/18/2023   AST 21 08/18/2023   ALT 20 08/18/2023   ALKPHOS 58 08/18/2023   BILITOT 0.6 08/18/2023   GFRNONAA >60 08/18/2023   GFRAA  11/19/2008    >60        The eGFR has been calculated using the MDRD equation. This calculation has not been validated in all clinical situations. eGFR's persistently <60 mL/min signify possible Chronic Kidney Disease.    Lab Results  Component Value Date   ZOX096 12 06/23/2023    Medications: I have reviewed the patient's current medications.   Assessment/Plan: Ampullary  carcinoma, moderately differentiated adenocarcinoma, status post a pancreaticoduodenectomy 05/17/2023 Stage IIIa (pT3b,pN1), lymphovascular and perineural invasion present, negative resection margins-closest margins or anterior and posterior peripancreatic soft tissue surfaces at 0.3 cm Elevated CA 19-9 Cycle 1 day 1 gemcitabine/capecitabine 06/23/2023, day 15 gemcitabine 07/06/2023 Cycle 2-day 1 gemcitabine/capecitabine 07/21/2023, day 15 gemcitabine 08/04/2023 Cycle 3-day 1 gemcitabine 08/18/2023 (capecitabine 08/20/2023), a 15 gemcitabine 09/01/2023 Duodenal obstruction August 2024 secondary to #1 H. pylori gastritis 05/10/2023-we will confirm treatment plan with Dr. 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  L2-L3 mild spinal canal stenosis.  L3-L4 mild to moderate left neural foraminal narrowing.  L5-S1 mild bilateral neural foraminal narrowing.  Narrowing of the left lateral recess at L1-L2.     Disposition: [name] is tolerating the capecitabine well at the current dose.  He will complete day 15 gemcitabine chemotherapy today.  He will return for an office visit with the plan to begin cycle 4 gemcitabine/capecitabine on 09/20/2023.  He will contact us for progression of the skin rash or hand/foot symptoms  Thornton Papas, MD  09/01/2023  10:43 AM

## 2023-09-01 NOTE — Progress Notes (Signed)
Patient seen by Dr. Thornton Papas today  Vitals are within treatment parameters:Yes   Labs are within treatment parameters: Yes   Treatment plan has been signed: Yes   Per physician team, Patient is ready for treatment and there are NO modifications to the treatment plan.

## 2023-09-01 NOTE — Patient Instructions (Signed)
[facility] - A DEPT OF [facility]   Discharge Instructions: Thank you for choosing [facility] to provide your oncology and hematology care.   If you have a lab appointment with the Cancer Center, please go directly to the Cancer Center and check in at the registration area.   Wear comfortable clothing and clothing appropriate for easy access to any Portacath or PICC line.   We strive to give you quality time with your provider. You may need to reschedule your appointment if you arrive late (15 or more minutes).  Arriving late affects you and other patients whose appointments are after yours.  Also, if you miss three or more appointments without notifying the office, you may be dismissed from the clinic at the provider's discretion.      For prescription refill requests, have your pharmacy contact our office and allow 72 hours for refills to be completed.    Today you received the following chemotherapy and/or immunotherapy agents GEMZAR      To help prevent nausea and vomiting after your treatment, we encourage you to take your nausea medication as directed.  BELOW ARE SYMPTOMS THAT SHOULD BE REPORTED IMMEDIATELY: *FEVER GREATER THAN 100.4 F (38 C) OR HIGHER *CHILLS OR SWEATING *NAUSEA AND VOMITING THAT IS NOT CONTROLLED WITH YOUR NAUSEA MEDICATION *UNUSUAL SHORTNESS OF BREATH *UNUSUAL BRUISING OR BLEEDING *URINARY PROBLEMS (pain or burning when urinating, or frequent urination) *BOWEL PROBLEMS (unusual diarrhea, constipation, pain near the anus) TENDERNESS IN MOUTH AND THROAT WITH OR WITHOUT PRESENCE OF ULCERS (sore throat, sores in mouth, or a toothache) UNUSUAL RASH, SWELLING OR PAIN  UNUSUAL VAGINAL DISCHARGE OR ITCHING   Items with * indicate a potential emergency and should be followed up as soon as possible or go to the Emergency Department if any problems should occur.  Please show the CHEMOTHERAPY ALERT CARD or IMMUNOTHERAPY  ALERT CARD at check-in to the Emergency Department and triage nurse.  Should you have questions after your visit or need to cancel or reschedule your appointment, please contact [facility]  Dept: [phone]  and follow the prompts.  Office hours are 8:00 a.m. to 4:30 p.m. Monday - Friday. Please note that voicemails left after 4:00 p.m. may not be returned until the following business day.  We are closed weekends and major holidays. You have access to a nurse at all times for urgent questions. Please call the main number to the clinic Dept: [phone] and follow the prompts.   For any non-urgent questions, you may also contact your provider using MyChart. We now offer e-Visits for anyone 72 and older to request care online for non-urgent symptoms. For details visit [email or web address].   Also download the MyChart app! Go to the app store, search "MyChart", open the app, select [place], and log in with your MyChart username and password.  Gemcitabine Injection What is this medication? GEMCITABINE (jem SYE ta been) treats some types of cancer. It works by slowing down the growth of cancer cells. This medicine may be used for other purposes; ask your health care provider or pharmacist if you have questions. COMMON BRAND NAME(S): Gemzar, Infugem What should I tell my care team before I take this medication? They need to know if you have any of these conditions: Blood disorders Infection Kidney disease Liver disease Lung or breathing disease, such as asthma or COPD Recent or ongoing radiation therapy An  unusual or allergic reaction to gemcitabine, other medications, foods, dyes, or preservatives If you or your partner are pregnant or trying to get pregnant Breast-feeding How should I use this medication? This medication is injected into a vein. It is given by your care team in a hospital or clinic setting. Talk to your care  team about the use of this medication in children. Special care may be needed. Overdosage: If you think you have taken too much of this medicine contact a poison control center or emergency room at once. NOTE: This medicine is only for you. Do not share this medicine with others. What if I miss a dose? Keep appointments for follow-up doses. It is important not to miss your dose. Call your care team if you are unable to keep an appointment. What may interact with this medication? Interactions have not been studied. This list may not describe all possible interactions. Give your health care provider a list of all the medicines, herbs, non-prescription drugs, or dietary supplements you use. Also tell them if you smoke, drink alcohol, or use illegal drugs. Some items may interact with your medicine. What should I watch for while using this medication? Your condition will be monitored carefully while you are receiving this medication. This medication may make you feel generally unwell. This is not uncommon, as chemotherapy can affect healthy cells as well as cancer cells. Report any side effects. Continue your course of treatment even though you feel ill unless your care team tells you to stop. In some cases, you may be given additional medications to help with side effects. Follow all directions for their use. This medication may increase your risk of getting an infection. Call your care team for advice if you get a fever, chills, sore throat, or other symptoms of a cold or flu. Do not treat yourself. Try to avoid being around people who are sick. This medication may increase your risk to bruise or bleed. Call your care team if you notice any unusual bleeding. Be careful brushing or flossing your teeth or using a toothpick because you may get an infection or bleed more easily. If you have any dental work done, tell your dentist you are receiving this medication. Avoid taking medications that contain  aspirin, acetaminophen, ibuprofen, naproxen, or ketoprofen unless instructed by your care team. These medications may hide a fever. Talk to your care team if you or your partner wish to become pregnant or think you might be pregnant. This medication can cause serious birth defects if taken during pregnancy and for 6 months after the last dose. A negative pregnancy test is required before starting this medication. A reliable form of contraception is recommended while taking this medication and for 6 months after the last dose. Talk to your care team about effective forms of contraception. Do not father a child while taking this medication and for 3 months after the last dose. Use a condom while having sex during this time period. Do not breastfeed while taking this medication and for at least 1 week after the last dose. This medication may cause infertility. Talk to your care team if you are concerned about your fertility. What side effects may I notice from receiving this medication? Side effects that you should report to your care team as soon as possible: Allergic reactions--skin rash, itching, hives, swelling of the face, lips, tongue, or throat Capillary leak syndrome--stomach or muscle pain, unusual weakness or fatigue, feeling faint or lightheaded, decrease in the amount of  urine, swelling of the ankles, hands, or feet, trouble breathing Infection--fever, chills, cough, sore throat, wounds that don't heal, pain or trouble when passing urine, general feeling of discomfort or being unwell Liver injury--right upper belly pain, loss of appetite, nausea, light-colored stool, dark yellow or brown urine, yellowing skin or eyes, unusual weakness or fatigue Low red blood cell level--unusual weakness or fatigue, dizziness, headache, trouble breathing Lung injury--shortness of breath or trouble breathing, cough, spitting up blood, chest pain, fever Stomach pain, bloody diarrhea, pale skin, unusual weakness or  fatigue, decrease in the amount of urine, which may be signs of hemolytic uremic syndrome Sudden and severe headache, confusion, change in vision, seizures, which may be signs of posterior reversible encephalopathy syndrome (PRES) Unusual bruising or bleeding Side effects that usually do not require medical attention (report to your care team if they continue or are bothersome): Diarrhea Drowsiness Hair loss Nausea Pain, redness, or swelling with sores inside the mouth or throat Vomiting This list may not describe all possible side effects. Call your doctor for medical advice about side effects. You may report side effects to FDA at 1-800-FDA-1088. Where should I keep my medication? This medication is given in a hospital or clinic. It will not be stored at home. NOTE: This sheet is a summary. It may not cover all possible information. If you have questions about this medicine, talk to your doctor, pharmacist, or health care provider.  2024 [employer] Standard (2022-01-13 00:00:00)

## 2023-09-02 ENCOUNTER — Ambulatory Visit: Payer: Medicare Other

## 2023-09-02 ENCOUNTER — Telehealth: Payer: Self-pay

## 2023-09-02 NOTE — Telephone Encounter (Signed)
Called pt due to No Show. He said his wife called to cancel because he had his infusion yesterday. He was scheduled for an appt with me on Monday at 11:00.

## 2023-09-06 ENCOUNTER — Ambulatory Visit: Payer: Medicare Other

## 2023-09-06 DIAGNOSIS — M21371 Foot drop, right foot: Secondary | ICD-10-CM | POA: Diagnosis not present

## 2023-09-06 DIAGNOSIS — C241 Malignant neoplasm of ampulla of Vater: Secondary | ICD-10-CM

## 2023-09-06 DIAGNOSIS — R262 Difficulty in walking, not elsewhere classified: Secondary | ICD-10-CM

## 2023-09-06 DIAGNOSIS — M6281 Muscle weakness (generalized): Secondary | ICD-10-CM

## 2023-09-06 NOTE — Therapy (Signed)
OUTPATIENT PHYSICAL THERAPY  LOWER EXTREMITY ONCOLOGY TREATMENT  Patient Name: Charles Lawson MRN: [id number] DOB:1940-10-17, 82 y.o., male Today's Date: 09/06/2023  END OF SESSION:  PT End of Session - 09/06/23 1104     Visit Number 9    Number of Visits 12    Date for PT Re-Evaluation 09/06/23    PT Start Time 1105    PT Stop Time 1158    PT Time Calculation (min) 53 min    Activity Tolerance Patient tolerated treatment well    Behavior During Therapy [facility] for tasks assessed/performed             Past Medical History:  Diagnosis Date   Allergic rhinitis, cause unspecified    Arthritis    Blood pressure elevated without history of HTN    BPH (benign prostatic hyperplasia)    Diverticulosis of colon    ED (erectile dysfunction)    Helicobacter pylori gastritis 04/2023   Tx quad tx and eradication documented neg stool ag 07/2023   History of basal cell carcinoma (BCC) excision    Hyperlipidemia    Impaired glucose tolerance 04/03/2013   Personal history of colonic polyps 06/07/2012   Past Surgical History:  Procedure Laterality Date   Quintella Reichert OSTEOTOMY Left 07/19/2020   Procedure: Quintella Reichert OSTEOTOMY;  Surgeon: Edwin Cap, DPM;  Location: [facility] [place];  Service: Podiatry;  Laterality: Left;   BIOPSY  05/10/2023   Procedure: BIOPSY;  Surgeon: Iva Boop, MD;  Location: [facility];  Service: Gastroenterology;;   BIOPSY  05/12/2023   Procedure: BIOPSY;  Surgeon: Iva Boop, MD;  Location: [facility];  Service: Gastroenterology;;   Arbutus Leas Left 07/19/2020   Procedure: Delos Haring;  Surgeon: Edwin Cap, DPM;  Location: [facility];  Service: Podiatry;  Laterality: Left;   COLONOSCOPY  last one 10-06-2017  dr Leone Payor   ESOPHAGOGASTRODUODENOSCOPY N/A 05/12/2023   Procedure: ESOPHAGOGASTRODUODENOSCOPY (EGD);  Surgeon: Iva Boop, MD;  Location: [facility];  Service: Gastroenterology;  Laterality: N/A;    ESOPHAGOGASTRODUODENOSCOPY (EGD) WITH PROPOFOL N/A 05/10/2023   Procedure: ESOPHAGOGASTRODUODENOSCOPY (EGD) WITH PROPOFOL;  Surgeon: Iva Boop, MD;  Location: [facility];  Service: Gastroenterology;  Laterality: N/A;   HAMMER TOE SURGERY Left 07/19/2020   Procedure: HAMMER TOE CORRECTION;  Surgeon: Edwin Cap, DPM;  Location: [facility];  Service: Podiatry;  Laterality: Left;   INGUINAL HERNIA REPAIR Right 11-21-2008  @ [facility]   IR IMAGING GUIDED PORT INSERTION  06/18/2023   LUMBAR LAMINECTOMY  1974   METATARSAL OSTEOTOMY Left 07/19/2020   Procedure: METATARSAL OSTEOTOMY;  Surgeon: Edwin Cap, DPM;  Location: [facility];  Service: Podiatry;  Laterality: Left;   ORIF TOE FRACTURE Left 07/19/2020   Procedure: OPEN TREATMENT OF METARSAL PHALANGEAL JOINT DISLOCATION;  Surgeon: Edwin Cap, DPM;  Location: [facility];  Service: Podiatry;  Laterality: Left;  ANKLE BLOCK WITH THIGH TOURNIQUET   Patient Active Problem List   Diagnosis Date Noted   Depression 07/01/2023   Coronary artery calcification seen on CT scan 07/01/2023   Aortic atherosclerosis (HCC) 07/01/2023   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   Malnutrition of moderate degree 05/14/2023   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   Gastric outlet obstruction 05/10/2023   Duodenitis 05/08/2023   Thrombocytopenia (HCC) 05/08/2023   Epigastric pain 04/30/2023   Asthmatic bronchitis 10/09/2021   Impacted ear wax 10/09/2021   Vitamin D deficiency 06/23/2021   Sinus infection 07/29/2020  Hammertoe of left foot    Tailor's bunion of left foot    Deformity of metatarsal bone of left foot    Injury of plantar plate of left foot    Hallux interphalangeus, acquired, left    Blood pressure elevated without history of HTN 04/02/2020   Ingrown nail 06/16/2019   Acute conjunctivitis of right eye 12/13/2018   Acute pharyngitis 12/13/2018   Dizziness 06/14/2018   Left inguinal hernia  06/11/2017   Mass of left side of neck 06/11/2017   Rash and nonspecific skin eruption 05/25/2014   Impaired glucose tolerance 04/03/2013   Hives 03/10/2013   History of colonic polyps 06/07/2012   Fatigue 03/30/2012   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   Allergic rhinitis 12/29/2011   Arthritis    Hyperlipidemia 01/30/2010   BENIGN PROSTATIC HYPERTROPHY, MILD, HX OF 01/30/2010   Erectile dysfunction 01/26/2008    PCP: Oliver Barre, MD  REFERRING PROVIDER: Lonna Cobb, NP  REFERRING DIAG: Ampullary Cancer  THERAPY DIAG:  Ampullary carcinoma (HCC)  Foot drop, right  Muscle weakness (generalized)  Difficulty walking  ONSET DATE: 06/15/2023  Rationale for Evaluation and Treatment: Rehabilitation  SUBJECTIVE:                                                                                                                                                                                           SUBJECTIVE STATEMENT:  I had my infusion last Wednesday. It takes 4-5 days to get half way back to feeling decent.I am walking much better, I am driving now. I got my brace from the prosthetist. Its real nice, but I do just as well without it My foot ROM is coming back and the numbness in my leg is gone. I can flex my ankle better.. I can go up my stairs at home now without trouble and I am driving.  EVAL Pt reports he has an appt scheduled for an AFO for his right foot but wanted to come to therapy to have his balance and strength checked. He developed Foot drop right after he got out of the hospital.  He is presently unable to drive due to foot drop. He has no appetite and has no energy for about 3 days after his infusion, but otherwise he is doing well. I am doing pretty well I think. 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 Duodenal obstruction August 2024 secondary to #1 H. pylori gastritis 8/19/202 Pt developed Right foot drop. Had lumbar MRI 07/16/2023. He has an appt scheduled for AFO    PAIN:  Are you having pain? No,   PRECAUTIONS: Fall risk;drop foot  RED FLAGS: None   WEIGHT BEARING RESTRICTIONS: No  FALLS:  Has patient fallen in last 6 months? No  LIVING ENVIRONMENT: Lives with: lives with their spouse Lives in: House/apartment Stairs: Yes; Internal: 13 steps; on left going up and External: 3 steps; none Has following equipment at home: Quad cane large base and Walker - 2 wheeled  OCCUPATION: retired  Human resources officer: enjoys exercising, pool  PRIOR LEVEL OF FUNCTION: Independent  PATIENT GOALS: improve ankle ROM, balance, be able to drive   OBJECTIVE: Note: Objective measures were completed at Evaluation unless otherwise noted.  COGNITION: Overall cognitive status: Within functional limits for tasks assessed   PALPATION: NT  OBSERVATIONS / OTHER ASSESSMENTS:ambulates with steppage gait due to Right foot drop  08/05/2023 PROM DF-10 with knee extended after stretching 09/06/2023 PROM 0 degrees after stretching, pt has 30 degrees AROM/no numbness  SENSATION: Light touch: Deficits , numbness in right    POSTURE: forward head, rounded shoulders  LOWER EXTREMITY STRENGTH:  MMT Right eval Right 09/06/2023  Hip flexion 5 5  Hip extension Able to bridge Able to bridge  Hip abduction 4+ 5  Hip adduction    Hip internal rotation 4+ 5  Hip external rotation 4+ 5  Knee flexion 5 5  Knee extension 5 5  Ankle dorsiflexion trace 3-  Ankle plantarflexion 3+ 4+  Ankle inversion  2- 4+  Ankle eversion 1+ 4+  Great toe extension 0 trace   (Blank rows = not tested)  MMT LEFT eval LEFT  Hip flexion 4+ 5  Hip extension Can bridge Can bridge  Hip abduction 4+ 5  Hip adduction    Hip internal rotation 4 5  Hip external rotation 5 5  Knee flexion 5 5  Knee extension 4+ 5  Ankle dorsiflexion 4+ 4+  Ankle plantarflexion 5 5  Ankle inversion 4 5  Ankle eversion 4 5  Great toe extension      (Blank rows = not tested)  LYMPHEDEMA ASSESSMENTS:   SURGERY TYPE/DATE: 05/17/2023 Pancreatic Duodenectomy  NUMBER OF LYMPH NODES REMOVED: 3 he thinks  CHEMOTHERAPY: Yes currently  RADIATION:NO  HORMONE TREATMENT: NO  INFECTIONS: NO  FUNCTIONAL TESTS:  5 times sit to stand 16.75 sec  4 position balance test; able to maintain all but Right SLS for 10 sec  GAIT: Distance walked: steppage gait due to drop foot on right Assistive device utilized: Quad cane small base Level of assistance: modified independence Comments: Pt needed instruction to use cane in his left UE, and to place it squarly on the floor. Adjusted cane height and base so he could use properly on the left and practiced walking in the gym.    Outcome measure: LE functional Scale:18  LEFS: 09/05/2024   53   TODAY'S TREATMENT:  DATE   09/06/2023  Nu Step seat 11, UE 9, Lev 6 x 10 min  546 Incline stretch 3 x 30 sec to stretch gastroc Right SLS 10 sec Checked bilateral LE strength and reviewed all goals with pt Measured AROM   08/30/2023 Nu Step seat 11, UE 9, Lev 6 x 10 min 545 Incline stretch 3 x 30 sec to stretch gastroc Mini squats on ax x 15, light HHD Mini lunges x 10 ea side with light HH Marching on ax x10 ea no HH Cable row 10# 2 x 10 Scapular retraction, shoulder ext, Bilateral ER x 10 with Green LEG Press 80 #, 3 x 10 Updated HEP with  Tband exs Constant VC's required for proper posture  08/25/2023 Nu Step seat 10, UE 9, Lev 6 x 7 min 556 steps Mini squats on ax x 15, light HHD Mini lunges x 10 ea side with light HH Marching on ax x10 ea no HH Vector reaches x 6 ea no HH 8 in step, partial step ups x 12 ea Amb. 3 min in Cancer gym x 3 min emphasis on lengthening stride Gastroc stretch with strap with right leg extended on table 3 x 30 sec Incline stretch at paralel l bars 3 x 30 08/23/2023 Walked in clinic 5 min with emphasis on erect posture and arm swing Nu Step seat 10, UE 9, Lev 6 x 7 min 345 steps Marching with HH x 10 B, marching on ax x 10 no HH Ax feet together, eyes closed 2 x 25 sec Leg Press 70 #, back reclined 2 x 10 SLS done B x 4 reps ea Cable Rows 7 # 2 x 10  08/16/2023 Marching on floor x 10 No HH Marching on ax x 15 no HH rest Mini lunges x 10  B no HH 3 D hip B. With yellow band x 10 ea  Lateral band walks in bar with yellow band 6 lengths in bars Nu Step lev 6, seat 10, UE 9 x , 513 steps Sit to stand x 10 Partial step ups 8 in x 10 B SLS to failure x 4 Bilaterally  08/11/2023 AFO is padded and appears to fit well, but was cautioned to wear for short periods and check his skin to be sure there is no irritation NuSTep Lev 5, seat 10, UE 8, x 11 min  583 steps Mini squats x 15 Step ups on airex x 10 ea forward and lateral, no HH Marching on ax x 15 no HH rest Mini lunges x 10 light HH  Cable machine 7 # 2 x 10 for scapular retraction  seated rest Pt requested to be done; his stomach was bothering him  08/09/2023 NuSTep Lev 4, seat 10, UE 8, x 11 in  642 steps Mini squats x 15 with HH Mini lunges x 10 ea side with HH Marching in place x 15 no HH Sidestepping 3 lengths in bars, rest and H20, no HH Cable machine; scapular retraction 7# 2 x 10, biceps curls 7 # 2 x 10 Sit to stand  without UE'sx 10 with emphasis on posture and slowly lowering Ambulated without cane cancer gym to  ortho gym down and back 2x's without rest with emphasis on posture and head up Showed pt sitting heel taps to try at home to try and get some DF return   08/05/2023 NuStep Lev 4, seat 10, UE 8 x 8 min, 433 steps PROM right ankle 3 x 30, Ankle DF  PROM = -10 Gastrc stretch with leg supported by table 3 x 30 using strap Foot  placement on step no HH x 10 forward and sideways B Partial step ups 6 in x 10 forward and lateral B with light HH Lunge with foot on step to increase DF Standing marching x 10 B No HH, heel raises x 10 B Step ups on ax x10 B no HH Ax beam 4 lengths forward, 4 lengths sideways with intermittent HH seated rest Sit to stand from chair;no hands x 10 Mini squat light hold on bars x 10 Mini lunges x 10 B with light HH seated rest Updated HEP  07/20/2023 Educated pt in right gastroc stretch with strap and standing runners stretch at wall. Gave illustrated and written instructions. Also educated pt in proper way to ambulate with quad cane in his left UE with proper cane placement and the need to support Right LE. Also practiced some with SPC. He is getting his AFO next week.    PATIENT EDUCATION:  Postural theraband exs with green band;Scapular retraction, extension, B ER Access Code: [email or web address] URL: [email or web address] Date: 08/05/2023 Prepared by: Alvira Monday  Exercises - Step Up  - 1 x daily - 7 x weekly - 1 sets - 15 reps - Standing Knee Flexion Stretch on Step  - 1 x daily - 7 x weekly - 1 sets - 5 reps - 15 hold - Mini Squat  - 1 x daily - 7 x weekly - 1 sets - 10 reps - Mini Lunge  - 1 x daily - 7 x weekly - 13 sets - 10 reps Education details: Access Code: [email or web address] URL: [email or web address] Date: 07/20/2023 Prepared by: Alvira Monday Exercises above to be done with light hand hold on railing or counter Exercises - Seated Calf Stretch with Strap  - 2 x daily - 7 x weekly - 1 sets - 3 reps - 30 hold - Standing Gastroc  Stretch  - 1 x daily - 7 x weekly - 1 sets - 3 reps - 30 hold Person educated: Patient Education method: Explanation, Demonstration, and Handouts Education comprehension: returned demonstration and needs further education  HOME EXERCISE PROGRAM: Seated gastroc stretch with strap, standing runners stretch  ASSESSMENT:  CLINICAL IMPRESSION:  Pt has achieved all STG's and LTG's established at initial evaluation. He is getting good return of ankle strength and notes improved ability to walk without his AFO. He is able to go up and down stairs normally now.  He is also back to driving normally. He has exceeded his goal for the LEFS with a score of 53 today.Marland KitchenHe also demonstrates improvements with balance and strength of bilateral LE's. He feels ready to be discharged. He does always have 4-5 days after infusions when he doesn't feel well or have much energy He was advised we are here for him if he has any other questions or concerns or feels he needs to return.   OBJECTIVE IMPAIRMENTS: Abnormal gait, decreased activity tolerance, decreased balance, decreased knowledge of condition, decreased ROM, impaired sensation, and postural dysfunction.   ACTIVITY LIMITATIONS: carrying, standing, stairs, and locomotion level  PARTICIPATION LIMITATIONS: driving, shopping, community activity, and yard work  PERSONAL FACTORS: Age and 1-2 comorbidities: Cancer, chemo, foot drop  are also affecting patient's functional outcome.   REHAB POTENTIAL: Good  CLINICAL DECISION MAKING: Stable/uncomplicated  EVALUATION COMPLEXITY: Low   GOALS: Goals reviewed with patient? Yes  SHORT TERM GOALS    Target date: 08/10/2023  Pt will  walk  with more normal gait pattern utilizing an AFO Baseline: Goal status: MET, 08/23/2023  2.  Pt will improve LEFS to atleast 35/80 for improved function Baseline:  Goal status: MET 09/05/2024 3.  Pt will be able to maintain tandem stance with right foot behind for 15 seconds to  decrease fall risk Baseline:  Goal status: MET 08/23/2023 4.  Pt will be independent with HEP for improved strength, balance and ankle ROM Baseline:  Goal status: MET 09/15/2024 sporadically 5. Pt will be able to resume driving with AFO GOAL status;MET 08/23/2023  LONG TERM GOALS: Target date: 08/31/2023  Pt will improve 5 time sit to stand test to no greater than 14.8 seconds to decrease fall risk Baseline:  Goal status: MET 08/25/2023 14.44 second 2.  Pt will have LEFS increased to 45/80 for improved function Baseline:  Goal status:MET 09/05/2024 3.  Pt will have right ankle PROM maintained at 5-8 degrees for improved function Baseline:  Goal status:MET 09/06/2023 (0 degrees)  4.  Pt will ambulate safely  with near normal gait without AD using AFO Baseline:  Goal status:MET 08/23/2023 5.  Pt will be able to go up and down stairs with near normal gait using hand rail Baseline:  Goal status: MET 09/05/2024 6.  Pt will have right SLS 10 seconds with AFO  Baseline 5 sec GOAL; MET without AFO  PLAN:  PT FREQUENCY: 2x/week  PT DURATION: 6 weeks  PLANNED INTERVENTIONS: 30865- PT Re-evaluation, 97110-Therapeutic exercises, 97112- Neuromuscular re-education, 97535- Self Care, 78469- Manual therapy, [phone]- Gait training, Patient/Family education, Balance training, Joint mobilization, Therapeutic exercises, Neuromuscular re-education, Gait training, and Self Care, aquatic therapy  PLAN FOR NEXT SESSION:, Pt is discharged to continue with his HEP  PHYSICAL THERAPY DISCHARGE SUMMARY  Visits from Start of Care: 9  Current functional level related to goals / functional outcomes: Achieved all goals   Remaining deficits: Pt is getting return back in his right ankle and now has 30 degrees of AROM with strength 3-/5.  He no longer has numbness. Strength is not yet normal, but doing very well.   Education / Equipment: AFO's   Patient agrees to discharge. Patient goals were met.  Patient is being discharged due to meeting the stated rehab goals.   Waynette Buttery, PT 09/06/2023, 1:04 PM

## 2023-09-20 ENCOUNTER — Inpatient Hospital Stay (HOSPITAL_BASED_OUTPATIENT_CLINIC_OR_DEPARTMENT_OTHER): Payer: Medicare Other | Admitting: Nurse Practitioner

## 2023-09-20 ENCOUNTER — Inpatient Hospital Stay: Payer: Medicare Other

## 2023-09-20 ENCOUNTER — Encounter: Payer: Self-pay | Admitting: Nurse Practitioner

## 2023-09-20 VITALS — HR 72

## 2023-09-20 VITALS — BP 125/80 | HR 78 | Temp 98.2°F | Resp 18 | Ht 71.0 in | Wt 174.0 lb

## 2023-09-20 DIAGNOSIS — C241 Malignant neoplasm of ampulla of Vater: Secondary | ICD-10-CM | POA: Diagnosis not present

## 2023-09-20 DIAGNOSIS — Z5111 Encounter for antineoplastic chemotherapy: Secondary | ICD-10-CM | POA: Diagnosis not present

## 2023-09-20 LAB — CMP (CANCER CENTER ONLY)
ALT: 21 U/L (ref 0–44)
AST: 22 U/L (ref 15–41)
Albumin: 3.8 g/dL (ref 3.5–5.0)
Alkaline Phosphatase: 70 U/L (ref 38–126)
Anion gap: 6 (ref 5–15)
BUN: 12 mg/dL (ref 8–23)
CO2: 27 mmol/L (ref 22–32)
Calcium: 8.9 mg/dL (ref 8.9–10.3)
Chloride: 104 mmol/L (ref 98–111)
Creatinine: 0.84 mg/dL (ref 0.61–1.24)
GFR, Estimated: >60 mL/min (ref >60)
Glucose, Bld: 100 mg/dL — ABNORMAL HIGH (ref 70–99)
Potassium: 4.4 mmol/L (ref 3.5–5.1)
Sodium: 137 mmol/L (ref 135–145)
Total Bilirubin: 0.9 mg/dL (ref 0.0–1.2)
Total Protein: 6.8 g/dL (ref 6.5–8.1)

## 2023-09-20 LAB — CBC WITH DIFFERENTIAL (CANCER CENTER ONLY)
Abs Immature Granulocytes: 0.02 10*3/uL (ref 0.00–0.07)
Basophils Absolute: 0.1 10*3/uL (ref 0.0–0.1)
Basophils Relative: 1 %
Eosinophils Absolute: 0.3 10*3/uL (ref 0.0–0.5)
Eosinophils Relative: 4 %
HCT: 40.1 % (ref 39.0–52.0)
Hemoglobin: 13.7 g/dL (ref 13.0–17.0)
Immature Granulocytes: 0 %
Lymphocytes Relative: 37 %
Lymphs Abs: 3 10*3/uL (ref 0.7–4.0)
MCH: 32.6 pg (ref 26.0–34.0)
MCHC: 34.2 g/dL (ref 30.0–36.0)
MCV: 95.5 fL (ref 80.0–100.0)
Monocytes Absolute: 1 10*3/uL (ref 0.1–1.0)
Monocytes Relative: 13 %
Neutro Abs: 3.6 10*3/uL (ref 1.7–7.7)
Neutrophils Relative %: 45 %
Platelet Count: 235 10*3/uL (ref 150–400)
RBC: 4.2 MIL/uL — ABNORMAL LOW (ref 4.22–5.81)
RDW: 18.6 % — ABNORMAL HIGH (ref 11.5–15.5)
WBC Count: 8 10*3/uL (ref 4.0–10.5)
nRBC: 0 % (ref 0.0–0.2)

## 2023-09-20 MED ORDER — SODIUM CHLORIDE 0.9% FLUSH
10.0000 mL | INTRAVENOUS | Status: DC | PRN
  Administered 2023-09-20: 10 mL

## 2023-09-20 MED ORDER — LIDOCAINE-PRILOCAINE 2.5-2.5 % EX CREA: 1.0000 | TOPICAL_CREAM | CUTANEOUS | 2 refills | Status: DC | PRN

## 2023-09-20 MED ORDER — SODIUM CHLORIDE 0.9 % IV SOLN: Freq: Once | INTRAVENOUS | Status: AC

## 2023-09-20 MED ORDER — SODIUM CHLORIDE 0.9 % IV SOLN
2000.0000 mg | Freq: Once | INTRAVENOUS | Status: AC
  Administered 2023-09-20: 2000 mg via INTRAVENOUS
  Filled 2023-09-20: qty 52.6

## 2023-09-20 MED ORDER — HEPARIN SOD (PORK) LOCK FLUSH 100 UNIT/ML IV SOLN
500.0000 [IU] | Freq: Once | INTRAVENOUS | Status: AC | PRN
  Administered 2023-09-20: 500 [IU]

## 2023-09-20 MED ORDER — PROCHLORPERAZINE MALEATE 10 MG PO TABS
10.0000 mg | ORAL_TABLET | Freq: Once | ORAL | Status: AC
  Administered 2023-09-20: 10 mg via ORAL
  Filled 2023-09-20: qty 1

## 2023-09-20 NOTE — Progress Notes (Signed)
 Patient seen by Lonna Cobb NP today  Vitals are within treatment parameters:No (Please specify and give further instructions.)  Labs are within treatment parameters: No (Please specify and give further instructions.)   Treatment plan has been signed: No   Per physician team, Patient is ready for treatment and there are NO modifications to the treatment plan.

## 2023-09-20 NOTE — Progress Notes (Signed)
  Charles Lawson OFFICE PROGRESS NOTE   Diagnosis: Ampullary carcinoma  INTERVAL HISTORY:   Charles Lawson returns as scheduled.  He has completed 3 cycles of gemcitabine/capecitabine.  He denies nausea/vomiting.  No mouth sores.  No diarrhea.  No hand or foot pain or redness.  Skin rash over the forearms continues to improve.  No fever or rash after Gemcitabine.  Objective:  Vital signs in last 24 hours:  Blood pressure 125/80, pulse 78, temperature 98.2 F (36.8 C), temperature source Temporal, resp. rate 18, height 5\' 11"  (1.803 m), weight 174 lb (78.9 kg), SpO2 100%.    HEENT: No thrush or ulcers. Resp: Lungs clear bilaterally. Cardio: Regular rate and rhythm. GI: No hepatosplenomegaly. Vascular: No leg edema. Skin: Palms with mild erythema, no skin breakdown.  Soles with mild erythema, dryness most notable at the heels there is dry desquamation.  Very mildly erythematous rash at the forearms. Port-A-Cath without erythema.  Lab Results:  Lab Results  Component Value Date   WBC 8.0 09/20/2023   HGB 13.7 09/20/2023   HCT 40.1 09/20/2023   MCV 95.5 09/20/2023   PLT 235 09/20/2023   NEUTROABS 3.6 09/20/2023    Imaging:  No results found.  Medications: I have reviewed the patient's current medications.  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 pylori gastritis 05/10/2023-we will confirm treatment plan with Dr. 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  L2-L3 mild spinal canal stenosis.  L3-L4 mild to moderate left neural foraminal narrowing.  L5-S1 mild bilateral neural foraminal narrowing.  Narrowing of the left lateral recess at L1-L2.  Disposition: Charles Lawson appears stable.  He has completed 3 cycles of gemcitabine/capecitabine.  Overall he tolerated cycle 3 well with the reduced dose of capecitabine.  Skin rash over the forearms is better.  He has mild changes of hand-foot syndrome.  Plan to proceed with cycle 4 today as scheduled.  CBC and chemistry panel reviewed.  Labs adequate to proceed as above.  He will return for follow-up and treatment in 2 weeks.  We are available to see him sooner if needed.  He understands to contact the office if the skin rash or hand/foot changes worsen.    Lonna Cobb ANP/GNP-BC   09/20/2023  11:09 AM

## 2023-09-20 NOTE — Patient Instructions (Signed)
[facility]   Discharge Instructions: Thank you for choosing [facility] to provide your oncology and hematology care.   If you have a lab appointment with the Cancer Center, please go directly to the Cancer Center and check in at the registration area.   Wear comfortable clothing and clothing appropriate for easy access to any Portacath or PICC line.   We strive to give you quality time with your provider. You may need to reschedule your appointment if you arrive late (15 or more minutes).  Arriving late affects you and other patients whose appointments are after yours.  Also, if you miss three or more appointments without notifying the office, you may be dismissed from the clinic at the provider's discretion.      For prescription refill requests, have your pharmacy contact our office and allow 72 hours for refills to be completed.    Today you received the following chemotherapy and/or immunotherapy agents Gemzar      To help prevent nausea and vomiting after your treatment, we encourage you to take your nausea medication as directed.  BELOW ARE SYMPTOMS THAT SHOULD BE REPORTED IMMEDIATELY: *FEVER GREATER THAN 100.4 F (38 C) OR HIGHER *CHILLS OR SWEATING *NAUSEA AND VOMITING THAT IS NOT CONTROLLED WITH YOUR NAUSEA MEDICATION *UNUSUAL SHORTNESS OF BREATH *UNUSUAL BRUISING OR BLEEDING *URINARY PROBLEMS (pain or burning when urinating, or frequent urination) *BOWEL PROBLEMS (unusual diarrhea, constipation, pain near the anus) TENDERNESS IN MOUTH AND THROAT WITH OR WITHOUT PRESENCE OF ULCERS (sore throat, sores in mouth, or a toothache) UNUSUAL RASH, SWELLING OR PAIN  UNUSUAL VAGINAL DISCHARGE OR ITCHING   Items with * indicate a potential emergency and should be followed up as soon as possible or go to the Emergency Department if any problems should occur.  Please show the CHEMOTHERAPY ALERT CARD or IMMUNOTHERAPY  ALERT CARD at check-in to the Emergency Department and triage nurse.  Should you have questions after your visit or need to cancel or reschedule your appointment, please contact [facility]  Dept: [phone]  and follow the prompts.  Office hours are 8:00 a.m. to 4:30 p.m. Monday - Friday. Please note that voicemails left after 4:00 p.m. may not be returned until the following business day.  We are closed weekends and major holidays. You have access to a nurse at all times for urgent questions. Please call the main number to the clinic Dept: [phone] and follow the prompts.   For any non-urgent questions, you may also contact your provider using MyChart. We now offer e-Visits for anyone 79 and older to request care online for non-urgent symptoms. For details visit [email or web address].   Also download the MyChart app! Go to the app store, search "MyChart", open the app, select , and log in with your MyChart username and password.  Gemcitabine Injection What is this medication? GEMCITABINE (jem SYE ta been) treats some types of cancer. It works by slowing down the growth of cancer cells. This medicine may be used for other purposes; ask your health care provider or pharmacist if you have questions. COMMON BRAND NAME(S): Gemzar, Infugem What should I tell my care team before I take this medication? They need to know if you have any of these conditions: Blood disorders Infection Kidney disease Liver disease Lung or breathing disease, such as asthma or COPD Recent or ongoing radiation therapy An  unusual or allergic reaction to gemcitabine, other medications, foods, dyes, or preservatives If you or your partner are pregnant or trying to get pregnant Breast-feeding How should I use this medication? This medication is injected into a vein. It is given by your care team in a hospital or clinic setting. Talk to your care  team about the use of this medication in children. Special care may be needed. Overdosage: If you think you have taken too much of this medicine contact a poison control center or emergency room at once. NOTE: This medicine is only for you. Do not share this medicine with others. What if I miss a dose? Keep appointments for follow-up doses. It is important not to miss your dose. Call your care team if you are unable to keep an appointment. What may interact with this medication? Interactions have not been studied. This list may not describe all possible interactions. Give your health care provider a list of all the medicines, herbs, non-prescription drugs, or dietary supplements you use. Also tell them if you smoke, drink alcohol, or use illegal drugs. Some items may interact with your medicine. What should I watch for while using this medication? Your condition will be monitored carefully while you are receiving this medication. This medication may make you feel generally unwell. This is not uncommon, as chemotherapy can affect healthy cells as well as cancer cells. Report any side effects. Continue your course of treatment even though you feel ill unless your care team tells you to stop. In some cases, you may be given additional medications to help with side effects. Follow all directions for their use. This medication may increase your risk of getting an infection. Call your care team for advice if you get a fever, chills, sore throat, or other symptoms of a cold or flu. Do not treat yourself. Try to avoid being around people who are sick. This medication may increase your risk to bruise or bleed. Call your care team if you notice any unusual bleeding. Be careful brushing or flossing your teeth or using a toothpick because you may get an infection or bleed more easily. If you have any dental work done, tell your dentist you are receiving this medication. Avoid taking medications that contain  aspirin, acetaminophen, ibuprofen, naproxen, or ketoprofen unless instructed by your care team. These medications may hide a fever. Talk to your care team if you or your partner wish to become pregnant or think you might be pregnant. This medication can cause serious birth defects if taken during pregnancy and for 6 months after the last dose. A negative pregnancy test is required before starting this medication. A reliable form of contraception is recommended while taking this medication and for 6 months after the last dose. Talk to your care team about effective forms of contraception. Do not father a child while taking this medication and for 3 months after the last dose. Use a condom while having sex during this time period. Do not breastfeed while taking this medication and for at least 1 week after the last dose. This medication may cause infertility. Talk to your care team if you are concerned about your fertility. What side effects may I notice from receiving this medication? Side effects that you should report to your care team as soon as possible: Allergic reactions--skin rash, itching, hives, swelling of the face, lips, tongue, or throat Capillary leak syndrome--stomach or muscle pain, unusual weakness or fatigue, feeling faint or lightheaded, decrease in the amount of  urine, swelling of the ankles, hands, or feet, trouble breathing Infection--fever, chills, cough, sore throat, wounds that don't heal, pain or trouble when passing urine, general feeling of discomfort or being unwell Liver injury--right upper belly pain, loss of appetite, nausea, light-colored stool, dark yellow or brown urine, yellowing skin or eyes, unusual weakness or fatigue Low red blood cell level--unusual weakness or fatigue, dizziness, headache, trouble breathing Lung injury--shortness of breath or trouble breathing, cough, spitting up blood, chest pain, fever Stomach pain, bloody diarrhea, pale skin, unusual weakness or  fatigue, decrease in the amount of urine, which may be signs of hemolytic uremic syndrome Sudden and severe headache, confusion, change in vision, seizures, which may be signs of posterior reversible encephalopathy syndrome (PRES) Unusual bruising or bleeding Side effects that usually do not require medical attention (report to your care team if they continue or are bothersome): Diarrhea Drowsiness Hair loss Nausea Pain, redness, or swelling with sores inside the mouth or throat Vomiting This list may not describe all possible side effects. Call your doctor for medical advice about side effects. You may report side effects to FDA at 1-800-FDA-1088. Where should I keep my medication? This medication is given in a hospital or clinic. It will not be stored at home. NOTE: This sheet is a summary. It may not cover all possible information. If you have questions about this medicine, talk to your doctor, pharmacist, or health care provider.  2024 Elsevier/Gold Standard (2022-01-13 00:00:00)

## 2023-09-23 ENCOUNTER — Other Ambulatory Visit (HOSPITAL_COMMUNITY): Payer: Self-pay

## 2023-09-23 NOTE — Progress Notes (Signed)
 Specialty Pharmacy Ongoing Clinical Assessment Note  Charles Lawson is a 83 y.o. male who is being followed by the specialty pharmacy service for RxSp Oncology   Patient's specialty medication(s) reviewed today: Capecitabine  (XELODA )   Missed doses in the last 4 weeks: 0 (No missed doses; however, his does was reduced by his provider.)   Patient/Caregiver did not have any additional questions or concerns.   Therapeutic benefit summary: Patient is achieving benefit   Adverse events/side effects summary: Experienced adverse events/side effects (Patient has had palmar/plantar syndrome and mouth soreness, his dose was lowered as a result and he is doing much better on the low dose.  He does have Udderly Smooth that he is using on his hands and feet.)   Patient's therapy is appropriate to: Continue    Goals Addressed             This Visit's Progress    Achieve or maintain remission   On track    Patient is on track. Patient will maintain adherence.  Charles Lawson is stable on adjuvant therapy.          Follow up:  6 months  Charles Lawson Specialty Pharmacist

## 2023-10-01 ENCOUNTER — Encounter: Payer: Self-pay | Admitting: Oncology

## 2023-10-02 ENCOUNTER — Other Ambulatory Visit: Payer: Self-pay | Admitting: Oncology

## 2023-10-04 ENCOUNTER — Inpatient Hospital Stay: Payer: Medicare Other | Attending: Nurse Practitioner

## 2023-10-04 ENCOUNTER — Inpatient Hospital Stay: Payer: Medicare Other

## 2023-10-04 ENCOUNTER — Inpatient Hospital Stay (HOSPITAL_BASED_OUTPATIENT_CLINIC_OR_DEPARTMENT_OTHER): Payer: Medicare Other | Admitting: Oncology

## 2023-10-04 VITALS — BP 132/72 | HR 72 | Temp 98.2°F | Resp 18 | Ht 71.0 in | Wt 171.8 lb

## 2023-10-04 VITALS — BP 134/82 | HR 68

## 2023-10-04 DIAGNOSIS — Z79631 Long term (current) use of antimetabolite agent: Secondary | ICD-10-CM | POA: Diagnosis not present

## 2023-10-04 DIAGNOSIS — Z9049 Acquired absence of other specified parts of digestive tract: Secondary | ICD-10-CM | POA: Diagnosis not present

## 2023-10-04 DIAGNOSIS — R110 Nausea: Secondary | ICD-10-CM | POA: Diagnosis not present

## 2023-10-04 DIAGNOSIS — C241 Malignant neoplasm of ampulla of Vater: Secondary | ICD-10-CM

## 2023-10-04 DIAGNOSIS — Z79899 Other long term (current) drug therapy: Secondary | ICD-10-CM | POA: Insufficient documentation

## 2023-10-04 DIAGNOSIS — Z8719 Personal history of other diseases of the digestive system: Secondary | ICD-10-CM | POA: Diagnosis not present

## 2023-10-04 DIAGNOSIS — M48061 Spinal stenosis, lumbar region without neurogenic claudication: Secondary | ICD-10-CM | POA: Diagnosis not present

## 2023-10-04 DIAGNOSIS — R21 Rash and other nonspecific skin eruption: Secondary | ICD-10-CM | POA: Insufficient documentation

## 2023-10-04 DIAGNOSIS — B9681 Helicobacter pylori [H. pylori] as the cause of diseases classified elsewhere: Secondary | ICD-10-CM | POA: Diagnosis not present

## 2023-10-04 LAB — CBC WITH DIFFERENTIAL (CANCER CENTER ONLY)
Abs Immature Granulocytes: 0.02 10*3/uL (ref 0.00–0.07)
Basophils Absolute: 0 10*3/uL (ref 0.0–0.1)
Basophils Relative: 0 %
Eosinophils Absolute: 0.2 10*3/uL (ref 0.0–0.5)
Eosinophils Relative: 3 %
HCT: 38.3 % — ABNORMAL LOW (ref 39.0–52.0)
Hemoglobin: 13.2 g/dL (ref 13.0–17.0)
Immature Granulocytes: 0 %
Lymphocytes Relative: 35 %
Lymphs Abs: 2.2 10*3/uL (ref 0.7–4.0)
MCH: 33.8 pg (ref 26.0–34.0)
MCHC: 34.5 g/dL (ref 30.0–36.0)
MCV: 98 fL (ref 80.0–100.0)
Monocytes Absolute: 0.7 10*3/uL (ref 0.1–1.0)
Monocytes Relative: 11 %
Neutro Abs: 3.1 10*3/uL (ref 1.7–7.7)
Neutrophils Relative %: 51 %
Platelet Count: 195 10*3/uL (ref 150–400)
RBC: 3.91 MIL/uL — ABNORMAL LOW (ref 4.22–5.81)
RDW: 15.9 % — ABNORMAL HIGH (ref 11.5–15.5)
WBC Count: 6.2 10*3/uL (ref 4.0–10.5)
nRBC: 0 % (ref 0.0–0.2)

## 2023-10-04 LAB — CMP (CANCER CENTER ONLY)
ALT: 74 U/L — ABNORMAL HIGH (ref 0–44)
AST: 54 U/L — ABNORMAL HIGH (ref 15–41)
Albumin: 3.7 g/dL (ref 3.5–5.0)
Alkaline Phosphatase: 186 U/L — ABNORMAL HIGH (ref 38–126)
Anion gap: 9 (ref 5–15)
BUN: 9 mg/dL (ref 8–23)
CO2: 24 mmol/L (ref 22–32)
Calcium: 9 mg/dL (ref 8.9–10.3)
Chloride: 106 mmol/L (ref 98–111)
Creatinine: 0.86 mg/dL (ref 0.61–1.24)
GFR, Estimated: >60 mL/min (ref >60)
Glucose, Bld: 131 mg/dL — ABNORMAL HIGH (ref 70–99)
Potassium: 3.9 mmol/L (ref 3.5–5.1)
Sodium: 139 mmol/L (ref 135–145)
Total Bilirubin: 2.2 mg/dL — ABNORMAL HIGH (ref 0.0–1.2)
Total Protein: 6.5 g/dL (ref 6.5–8.1)

## 2023-10-04 MED ORDER — HEPARIN SOD (PORK) LOCK FLUSH 100 UNIT/ML IV SOLN
500.0000 [IU] | Freq: Once | INTRAVENOUS | Status: AC | PRN
  Administered 2023-10-04: 500 [IU]

## 2023-10-04 MED ORDER — PROCHLORPERAZINE MALEATE 10 MG PO TABS
10.0000 mg | ORAL_TABLET | Freq: Once | ORAL | Status: AC
  Administered 2023-10-04: 10 mg via ORAL
  Filled 2023-10-04: qty 1

## 2023-10-04 MED ORDER — SODIUM CHLORIDE 0.9% FLUSH
10.0000 mL | INTRAVENOUS | Status: DC | PRN
  Administered 2023-10-04: 10 mL

## 2023-10-04 MED ORDER — SODIUM CHLORIDE 0.9 % IV SOLN
2000.0000 mg | Freq: Once | INTRAVENOUS | Status: AC
  Administered 2023-10-04: 2000 mg via INTRAVENOUS
  Filled 2023-10-04: qty 52.6

## 2023-10-04 MED ORDER — SODIUM CHLORIDE 0.9 % IV SOLN: Freq: Once | INTRAVENOUS | Status: AC

## 2023-10-04 NOTE — Progress Notes (Signed)
 [facility] OFFICE PROGRESS NOTE   Diagnosis: Ampullary carcinoma  INTERVAL HISTORY:   Charles Lawson had another cycle of gemcitabine /capecitabine  09/20/2023.  No fever.  The forearm rash remains improved.  No mouth sores, hand/foot pain, or diarrhea.  He reports mild nausea beginning a few days after he received gemcitabine .  The nausea is relieved with an antiemetic.  Objective:  Vital signs in last 24 hours:  Blood pressure 132/72, pulse 72, temperature 98.2 F (36.8 C), temperature source Temporal, resp. rate 18, height 5' 11 (1.803 m), weight 171 lb 12.8 oz (77.9 kg), SpO2 99%.    HEENT: No thrush or ulcers Resp: Lungs clear bilaterally Cardio: Regular rate and rhythm GI: No hepatosplenomegaly Vascular: No leg edema  Skin: Palms without erythema, dryness and hyperpigmentation of the soles, mild hyperpigmented rash over the forearms  Portacath/PICC-without erythema  Lab Results:  Lab Results  Component Value Date   WBC 6.2 10/04/2023   HGB 13.2 10/04/2023   HCT 38.3 (L) 10/04/2023   MCV 98.0 10/04/2023   PLT 195 10/04/2023   NEUTROABS 3.1 10/04/2023    CMP  Lab Results  Component Value Date   NA 137 09/20/2023   K 4.4 09/20/2023   CL 104 09/20/2023   CO2 27 09/20/2023   GLUCOSE 100 (H) 09/20/2023   BUN 12 09/20/2023   CREATININE 0.84 09/20/2023   CALCIUM  8.9 09/20/2023   PROT 6.8 09/20/2023   ALBUMIN 3.8 09/20/2023   AST 22 09/20/2023   ALT 21 09/20/2023   ALKPHOS 70 09/20/2023   BILITOT 0.9 09/20/2023   GFRNONAA >60 09/20/2023   GFRAA  11/19/2008    >60        The eGFR has been calculated using the MDRD equation. This calculation has not been validated in all clinical situations. eGFR's persistently <60 mL/min signify possible Chronic Kidney Disease.    Lab Results  Component Value Date   RJW800 12 06/23/2023    Medications: I have reviewed the patient's current medications.   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 pylori gastritis 05/10/2023-we will confirm treatment plan with Dr. 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  L2-L3 mild spinal canal stenosis.  L3-L4 mild to moderate left neural foraminal narrowing.  L5-S1 mild bilateral neural foraminal narrowing.  Narrowing of the left lateral recess at L1-L2.    Disposition: [name] has completed 4 cycles of gemcitabine /capecitabine .  He tolerated the capecitabine  better with the dose reduction.  He will complete another treatment with gemcitabine  today.  He will return for an office visit prior to cycle 5 gemcitabine /capecitabine  10/18/2023.    Arley Hof, MD  10/04/2023  10:04 AM  Addendum: The chemistry panel returned with mild elevation of liver enzymes, alk phosphatase, and bilirubin.  The etiology is unclear.  He will receive gemcitabine  today.  Xeloda  will be placed on hold.  He will return for a repeat chemistry panel on 10/07/2023.  We  refer him for imaging if the liver enzymes are more elevated.

## 2023-10-04 NOTE — Patient Instructions (Signed)
 [facility]   Discharge Instructions: Thank you for choosing [facility] to provide your oncology and hematology care.   If you have a lab appointment with the Cancer Center, please go directly to the Cancer Center and check in at the registration area.   Wear comfortable clothing and clothing appropriate for easy access to any Portacath or PICC line.   We strive to give you quality time with your provider. You may need to reschedule your appointment if you arrive late (15 or more minutes).  Arriving late affects you and other patients whose appointments are after yours.  Also, if you miss three or more appointments without notifying the office, you may be dismissed from the clinic at the provider's discretion.      For prescription refill requests, have your pharmacy contact our office and allow 72 hours for refills to be completed.    Today you received the following chemotherapy and/or immunotherapy agents GEMZAR      To help prevent nausea and vomiting after your treatment, we encourage you to take your nausea medication as directed.  BELOW ARE SYMPTOMS THAT SHOULD BE REPORTED IMMEDIATELY: *FEVER GREATER THAN 100.4 F (38 C) OR HIGHER *CHILLS OR SWEATING *NAUSEA AND VOMITING THAT IS NOT CONTROLLED WITH YOUR NAUSEA MEDICATION *UNUSUAL SHORTNESS OF BREATH *UNUSUAL BRUISING OR BLEEDING *URINARY PROBLEMS (pain or burning when urinating, or frequent urination) *BOWEL PROBLEMS (unusual diarrhea, constipation, pain near the anus) TENDERNESS IN MOUTH AND THROAT WITH OR WITHOUT PRESENCE OF ULCERS (sore throat, sores in mouth, or a toothache) UNUSUAL RASH, SWELLING OR PAIN  UNUSUAL VAGINAL DISCHARGE OR ITCHING   Items with * indicate a potential emergency and should be followed up as soon as possible or go to the Emergency Department if any problems should occur.  Please show the CHEMOTHERAPY ALERT CARD or IMMUNOTHERAPY  ALERT CARD at check-in to the Emergency Department and triage nurse.  Should you have questions after your visit or need to cancel or reschedule your appointment, please contact [facility]  Dept: [phone]  and follow the prompts.  Office hours are 8:00 a.m. to 4:30 p.m. Monday - Friday. Please note that voicemails left after 4:00 p.m. may not be returned until the following business day.  We are closed weekends and major holidays. You have access to a nurse at all times for urgent questions. Please call the main number to the clinic Dept: [phone] and follow the prompts.   For any non-urgent questions, you may also contact your provider using MyChart. We now offer e-Visits for anyone 72 and older to request care online for non-urgent symptoms. For details visit [email or web address].   Also download the MyChart app! Go to the app store, search "MyChart", open the app, select [place], and log in with your MyChart username and password.  Gemcitabine Injection What is this medication? GEMCITABINE (jem SYE ta been) treats some types of cancer. It works by slowing down the growth of cancer cells. This medicine may be used for other purposes; ask your health care provider or pharmacist if you have questions. COMMON BRAND NAME(S): Gemzar, Infugem What should I tell my care team before I take this medication? They need to know if you have any of these conditions: Blood disorders Infection Kidney disease Liver disease Lung or breathing disease, such as asthma or COPD Recent or ongoing radiation therapy An  unusual or allergic reaction to gemcitabine, other medications, foods, dyes, or preservatives If you or your partner are pregnant or trying to get pregnant Breast-feeding How should I use this medication? This medication is injected into a vein. It is given by your care team in a hospital or clinic setting. Talk to your care  team about the use of this medication in children. Special care may be needed. Overdosage: If you think you have taken too much of this medicine contact a poison control center or emergency room at once. NOTE: This medicine is only for you. Do not share this medicine with others. What if I miss a dose? Keep appointments for follow-up doses. It is important not to miss your dose. Call your care team if you are unable to keep an appointment. What may interact with this medication? Interactions have not been studied. This list may not describe all possible interactions. Give your health care provider a list of all the medicines, herbs, non-prescription drugs, or dietary supplements you use. Also tell them if you smoke, drink alcohol, or use illegal drugs. Some items may interact with your medicine. What should I watch for while using this medication? Your condition will be monitored carefully while you are receiving this medication. This medication may make you feel generally unwell. This is not uncommon, as chemotherapy can affect healthy cells as well as cancer cells. Report any side effects. Continue your course of treatment even though you feel ill unless your care team tells you to stop. In some cases, you may be given additional medications to help with side effects. Follow all directions for their use. This medication may increase your risk of getting an infection. Call your care team for advice if you get a fever, chills, sore throat, or other symptoms of a cold or flu. Do not treat yourself. Try to avoid being around people who are sick. This medication may increase your risk to bruise or bleed. Call your care team if you notice any unusual bleeding. Be careful brushing or flossing your teeth or using a toothpick because you may get an infection or bleed more easily. If you have any dental work done, tell your dentist you are receiving this medication. Avoid taking medications that contain  aspirin, acetaminophen, ibuprofen, naproxen, or ketoprofen unless instructed by your care team. These medications may hide a fever. Talk to your care team if you or your partner wish to become pregnant or think you might be pregnant. This medication can cause serious birth defects if taken during pregnancy and for 6 months after the last dose. A negative pregnancy test is required before starting this medication. A reliable form of contraception is recommended while taking this medication and for 6 months after the last dose. Talk to your care team about effective forms of contraception. Do not father a child while taking this medication and for 3 months after the last dose. Use a condom while having sex during this time period. Do not breastfeed while taking this medication and for at least 1 week after the last dose. This medication may cause infertility. Talk to your care team if you are concerned about your fertility. What side effects may I notice from receiving this medication? Side effects that you should report to your care team as soon as possible: Allergic reactions--skin rash, itching, hives, swelling of the face, lips, tongue, or throat Capillary leak syndrome--stomach or muscle pain, unusual weakness or fatigue, feeling faint or lightheaded, decrease in the amount of  urine, swelling of the ankles, hands, or feet, trouble breathing Infection--fever, chills, cough, sore throat, wounds that don't heal, pain or trouble when passing urine, general feeling of discomfort or being unwell Liver injury--right upper belly pain, loss of appetite, nausea, light-colored stool, dark yellow or brown urine, yellowing skin or eyes, unusual weakness or fatigue Low red blood cell level--unusual weakness or fatigue, dizziness, headache, trouble breathing Lung injury--shortness of breath or trouble breathing, cough, spitting up blood, chest pain, fever Stomach pain, bloody diarrhea, pale skin, unusual weakness or  fatigue, decrease in the amount of urine, which may be signs of hemolytic uremic syndrome Sudden and severe headache, confusion, change in vision, seizures, which may be signs of posterior reversible encephalopathy syndrome (PRES) Unusual bruising or bleeding Side effects that usually do not require medical attention (report to your care team if they continue or are bothersome): Diarrhea Drowsiness Hair loss Nausea Pain, redness, or swelling with sores inside the mouth or throat Vomiting This list may not describe all possible side effects. Call your doctor for medical advice about side effects. You may report side effects to FDA at 1-800-FDA-1088. Where should I keep my medication? This medication is given in a hospital or clinic. It will not be stored at home. NOTE: This sheet is a summary. It may not cover all possible information. If you have questions about this medicine, talk to your doctor, pharmacist, or health care provider.  2024 Elsevier/Gold Standard (2022-01-13 00:00:00)

## 2023-10-04 NOTE — Progress Notes (Signed)
 Patient seen by Dr. Arley Hof today  Vitals are within treatment parameters:Yes   Labs are within treatment parameters: No (Please specify and give further instructions.) Total bili 2.2--OK to proceed w/Gemzar   Treatment plan has been signed: Yes   Per physician team, Patient is ready for treatment. Please note the following modifications: Proceed w/Gemzar . Stop current cycle of Xeloda  and repeat CMP on Thursday.

## 2023-10-07 ENCOUNTER — Inpatient Hospital Stay: Payer: Medicare Other

## 2023-10-07 DIAGNOSIS — R110 Nausea: Secondary | ICD-10-CM | POA: Diagnosis not present

## 2023-10-07 DIAGNOSIS — Z79899 Other long term (current) drug therapy: Secondary | ICD-10-CM | POA: Diagnosis not present

## 2023-10-07 DIAGNOSIS — R21 Rash and other nonspecific skin eruption: Secondary | ICD-10-CM | POA: Diagnosis not present

## 2023-10-07 DIAGNOSIS — C241 Malignant neoplasm of ampulla of Vater: Secondary | ICD-10-CM

## 2023-10-07 DIAGNOSIS — Z79631 Long term (current) use of antimetabolite agent: Secondary | ICD-10-CM | POA: Diagnosis not present

## 2023-10-07 DIAGNOSIS — B9681 Helicobacter pylori [H. pylori] as the cause of diseases classified elsewhere: Secondary | ICD-10-CM | POA: Diagnosis not present

## 2023-10-07 LAB — CMP (CANCER CENTER ONLY)
ALT: 140 U/L — ABNORMAL HIGH (ref 0–44)
AST: 150 U/L — ABNORMAL HIGH (ref 15–41)
Albumin: 3.8 g/dL (ref 3.5–5.0)
Alkaline Phosphatase: 160 U/L — ABNORMAL HIGH (ref 38–126)
Anion gap: 5 (ref 5–15)
BUN: 14 mg/dL (ref 8–23)
CO2: 28 mmol/L (ref 22–32)
Calcium: 9.5 mg/dL (ref 8.9–10.3)
Chloride: 102 mmol/L (ref 98–111)
Creatinine: 0.71 mg/dL (ref 0.61–1.24)
GFR, Estimated: >60 mL/min (ref >60)
Glucose, Bld: 119 mg/dL — ABNORMAL HIGH (ref 70–99)
Potassium: 4.5 mmol/L (ref 3.5–5.1)
Sodium: 135 mmol/L (ref 135–145)
Total Bilirubin: 2.2 mg/dL — ABNORMAL HIGH (ref 0.0–1.2)
Total Protein: 6.8 g/dL (ref 6.5–8.1)

## 2023-10-08 ENCOUNTER — Telehealth: Payer: Self-pay | Admitting: *Deleted

## 2023-10-08 DIAGNOSIS — C241 Malignant neoplasm of ampulla of Vater: Secondary | ICD-10-CM

## 2023-10-08 NOTE — Telephone Encounter (Signed)
Informed Charles Lawson that his liver functions are higher. MD wants him to have CT scan before 1/27 visit and for him to continue to hold his Xeloda.  CT scheduled for tomorrow at 1 pm/arrive in ER to check in at 12:30. Patient agrees.

## 2023-10-09 ENCOUNTER — Ambulatory Visit (HOSPITAL_BASED_OUTPATIENT_CLINIC_OR_DEPARTMENT_OTHER)
Admission: RE | Admit: 2023-10-09 | Discharge: 2023-10-09 | Discharge status: Home / Self Care | Payer: Medicare Other | Source: Ambulatory Visit | Attending: Oncology | Admitting: Oncology

## 2023-10-09 DIAGNOSIS — C241 Malignant neoplasm of ampulla of Vater: Secondary | ICD-10-CM | POA: Diagnosis not present

## 2023-10-09 MED ORDER — IOHEXOL 300 MG/ML  SOLN
100.0000 mL | Freq: Once | INTRAMUSCULAR | Status: AC | PRN
  Administered 2023-10-09: 100 mL via INTRAVENOUS

## 2023-10-12 ENCOUNTER — Telehealth: Payer: Self-pay | Admitting: *Deleted

## 2023-10-12 NOTE — Telephone Encounter (Signed)
Charles Lawson called to request results of CT scan. Has not been read yet. Called reading room and requested a read when possible. Wife also reports he is not feeling well--nothing specific, just wants to sleep a lot. He is eating/drinking OK and bowels/bladder OK. She will call back tomorrow if not better.

## 2023-10-13 ENCOUNTER — Telehealth: Payer: Self-pay | Admitting: *Deleted

## 2023-10-13 NOTE — Telephone Encounter (Signed)
Notified Charles Lawson and his wife that CT shows no evidence of cancer. May have inflammation of bile duct. Call for fever, chills, abdominal pain. He reports he had chills yesterday, but did not check his temp. Was nauseated and slept all day as well. Feeling better today.

## 2023-10-13 NOTE — Telephone Encounter (Signed)
-----   Message from Thornton Papas sent at 10/12/2023  7:21 PM EST ----- Please call patient, CT shows no evidence of cancer, possible inflammation of bile duct, call for fever or chills,  f/u as scheduled

## 2023-10-16 ENCOUNTER — Other Ambulatory Visit: Payer: Self-pay | Admitting: Oncology

## 2023-10-18 ENCOUNTER — Inpatient Hospital Stay: Payer: Medicare Other

## 2023-10-18 ENCOUNTER — Encounter: Payer: Self-pay | Admitting: Nurse Practitioner

## 2023-10-18 ENCOUNTER — Inpatient Hospital Stay (HOSPITAL_BASED_OUTPATIENT_CLINIC_OR_DEPARTMENT_OTHER): Payer: Medicare Other | Admitting: Nurse Practitioner

## 2023-10-18 VITALS — BP 132/70 | HR 82 | Temp 98.1°F | Resp 18 | Ht 71.0 in | Wt 170.2 lb

## 2023-10-18 DIAGNOSIS — C241 Malignant neoplasm of ampulla of Vater: Secondary | ICD-10-CM

## 2023-10-18 DIAGNOSIS — Z79899 Other long term (current) drug therapy: Secondary | ICD-10-CM | POA: Diagnosis not present

## 2023-10-18 DIAGNOSIS — Z79631 Long term (current) use of antimetabolite agent: Secondary | ICD-10-CM | POA: Diagnosis not present

## 2023-10-18 DIAGNOSIS — R110 Nausea: Secondary | ICD-10-CM | POA: Diagnosis not present

## 2023-10-18 DIAGNOSIS — R21 Rash and other nonspecific skin eruption: Secondary | ICD-10-CM | POA: Diagnosis not present

## 2023-10-18 DIAGNOSIS — B9681 Helicobacter pylori [H. pylori] as the cause of diseases classified elsewhere: Secondary | ICD-10-CM | POA: Diagnosis not present

## 2023-10-18 DIAGNOSIS — Z95828 Presence of other vascular implants and grafts: Secondary | ICD-10-CM

## 2023-10-18 LAB — CBC WITH DIFFERENTIAL (CANCER CENTER ONLY)
Abs Immature Granulocytes: 0.03 10*3/uL (ref 0.00–0.07)
Basophils Absolute: 0 10*3/uL (ref 0.0–0.1)
Basophils Relative: 1 %
Eosinophils Absolute: 0.1 10*3/uL (ref 0.0–0.5)
Eosinophils Relative: 2 %
HCT: 37.3 % — ABNORMAL LOW (ref 39.0–52.0)
Hemoglobin: 12.6 g/dL — ABNORMAL LOW (ref 13.0–17.0)
Immature Granulocytes: 0 %
Lymphocytes Relative: 32 %
Lymphs Abs: 2.3 10*3/uL (ref 0.7–4.0)
MCH: 33.2 pg (ref 26.0–34.0)
MCHC: 33.8 g/dL (ref 30.0–36.0)
MCV: 98.4 fL (ref 80.0–100.0)
Monocytes Absolute: 1.1 10*3/uL — ABNORMAL HIGH (ref 0.1–1.0)
Monocytes Relative: 16 %
Neutro Abs: 3.6 10*3/uL (ref 1.7–7.7)
Neutrophils Relative %: 49 %
Platelet Count: 264 10*3/uL (ref 150–400)
RBC: 3.79 MIL/uL — ABNORMAL LOW (ref 4.22–5.81)
RDW: 15.8 % — ABNORMAL HIGH (ref 11.5–15.5)
WBC Count: 7.1 10*3/uL (ref 4.0–10.5)
nRBC: 0 % (ref 0.0–0.2)

## 2023-10-18 LAB — CMP (CANCER CENTER ONLY)
ALT: 78 U/L — ABNORMAL HIGH (ref 0–44)
AST: 62 U/L — ABNORMAL HIGH (ref 15–41)
Albumin: 3.7 g/dL (ref 3.5–5.0)
Alkaline Phosphatase: 280 U/L — ABNORMAL HIGH (ref 38–126)
Anion gap: 6 (ref 5–15)
BUN: 12 mg/dL (ref 8–23)
CO2: 27 mmol/L (ref 22–32)
Calcium: 8.8 mg/dL — ABNORMAL LOW (ref 8.9–10.3)
Chloride: 103 mmol/L (ref 98–111)
Creatinine: 0.67 mg/dL (ref 0.61–1.24)
GFR, Estimated: >60 mL/min (ref >60)
Glucose, Bld: 102 mg/dL — ABNORMAL HIGH (ref 70–99)
Potassium: 4.5 mmol/L (ref 3.5–5.1)
Sodium: 136 mmol/L (ref 135–145)
Total Bilirubin: 2.7 mg/dL — ABNORMAL HIGH (ref 0.0–1.2)
Total Protein: 6.5 g/dL (ref 6.5–8.1)

## 2023-10-18 MED ORDER — SODIUM CHLORIDE 0.9% FLUSH
10.0000 mL | INTRAVENOUS | Status: AC | PRN
  Administered 2023-10-18: 10 mL via INTRAVENOUS

## 2023-10-18 MED ORDER — HEPARIN SOD (PORK) LOCK FLUSH 100 UNIT/ML IV SOLN
500.0000 [IU] | Freq: Once | INTRAVENOUS | Status: AC
  Administered 2023-10-18: 500 [IU] via INTRAVENOUS

## 2023-10-18 NOTE — Progress Notes (Unsigned)
  [facility] OFFICE PROGRESS NOTE   Diagnosis: Ampullary carcinoma  INTERVAL HISTORY:   Charles Lawson returns as scheduled.  He completed cycle 4-day 15 Gemcitabine 10/04/2023.  Xeloda was placed on hold 10/04/2023 due to LFT abnormalities.  CTs abdomen/pelvis 10/09/2023, possible cholangitis.  He had an episode of chills about 2 weeks ago.  No known fever.  He is intermittently fatigued.  He had mild nausea for 1 day.  No diarrhea.  No abdominal pain.  No hand or foot pain or redness.  Forearm rash continues to be improved.  No rash following Gemcitabine.    Objective:  Vital signs in last 24 hours:  Blood pressure 132/70, pulse 82, temperature 98.1 F (36.7 C), temperature source Temporal, resp. rate 18, height 5\' 11"  (1.803 m), weight 170 lb 3.2 oz (77.2 kg), SpO2 100%.    HEENT: No thrush or ulcers. Resp: Lungs clear bilaterally. Cardio: Regular rate and rhythm. GI: Abdomen soft and nontender.  No hepatosplenomegaly. Vascular: No leg edema. Skin: Palms with mild erythema.  Very faint dry rash over the forearms. Port-A-Cath without erythema.  Lab Results:  Lab Results  Component Value Date   WBC 7.1 10/18/2023   HGB 12.6 (L) 10/18/2023   HCT 37.3 (L) 10/18/2023   MCV 98.4 10/18/2023   PLT 264 10/18/2023   NEUTROABS 3.6 10/18/2023    Imaging:  No results found.  Medications: I have reviewed the patient's current medications.  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 pylori gastritis 05/10/2023-we will confirm treatment plan with Dr. 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  L2-L3 mild spinal canal stenosis.  L3-L4 mild to moderate left neural foraminal narrowing.  L5-S1 mild bilateral neural foraminal narrowing.  Narrowing of the left lateral recess at L1-L2.    Disposition: Charles Lawson appears stable.  He has completed 4 cycles of gemcitabine/capecitabine.  Capecitabine was placed on hold 10/04/2023 due to liver function abnormalities.  CT abdomen was negative for evidence of locally recurrent or metastatic pancreas cancer; mild cholangitis could not be excluded; mild biliary duct dilatation left hepatic lobe was favored to be benign, postprocedural.  We reviewed the labs from today.  The bilirubin is higher, AST/ALT and alkaline phosphatase remain elevated.  We are placing treatment on hold.  We referred him for abdominal MRI/MRCP.    He is scheduled to return for routine follow-up in 2 weeks.  We will adjust accordingly pending completion of the MRI/MRCP.  Patient seen with Dr. Truett Perna.    Lonna Cobb ANP/GNP-BC   10/18/2023  11:54 AM

## 2023-10-18 NOTE — Addendum Note (Signed)
Addended by: Dimitri Ped on: 10/18/2023 01:14 PM   Modules accepted: Orders

## 2023-10-19 ENCOUNTER — Encounter: Payer: Self-pay | Admitting: Oncology

## 2023-10-19 ENCOUNTER — Other Ambulatory Visit (HOSPITAL_COMMUNITY): Payer: Self-pay

## 2023-10-21 ENCOUNTER — Ambulatory Visit: Payer: Medicare Other

## 2023-10-21 ENCOUNTER — Ambulatory Visit: Payer: Medicare Other | Admitting: Oncology

## 2023-10-21 ENCOUNTER — Other Ambulatory Visit: Payer: Medicare Other

## 2023-10-22 ENCOUNTER — Ambulatory Visit
Admission: RE | Admit: 2023-10-22 | Discharge: 2023-10-22 | Disposition: A | Discharge status: Home / Self Care | Payer: Medicare Other | Source: Ambulatory Visit | Attending: Nurse Practitioner | Admitting: Nurse Practitioner

## 2023-10-22 ENCOUNTER — Telehealth: Payer: Self-pay | Admitting: Nurse Practitioner

## 2023-10-22 DIAGNOSIS — C241 Malignant neoplasm of ampulla of Vater: Secondary | ICD-10-CM

## 2023-10-22 DIAGNOSIS — Z85068 Personal history of other malignant neoplasm of small intestine: Secondary | ICD-10-CM | POA: Diagnosis not present

## 2023-10-22 DIAGNOSIS — K838 Other specified diseases of biliary tract: Secondary | ICD-10-CM | POA: Diagnosis not present

## 2023-10-22 MED ORDER — GADOPICLENOL 0.5 MMOL/ML IV SOLN
8.0000 mL | Freq: Once | INTRAVENOUS | Status: AC | PRN
  Administered 2023-10-22: 8 mL via INTRAVENOUS

## 2023-10-22 NOTE — Telephone Encounter (Signed)
I contacted Mr. Campton and his wife with the MRI/MRCP result.  There are no findings of metastatic disease in the abdomen.  There is mild intrahepatic biliary dilatation; common bile duct appears thickened and demonstrates contrast-enhancement possibly suggesting inflammation/cholangitis.  He denies fever, chills.  He understands we are forwarding the result to Dr. Leone Payor for review and we will contact him with any recommendations.

## 2023-10-25 ENCOUNTER — Other Ambulatory Visit: Payer: Self-pay | Admitting: *Deleted

## 2023-10-25 ENCOUNTER — Encounter: Payer: Self-pay | Admitting: *Deleted

## 2023-10-25 DIAGNOSIS — C241 Malignant neoplasm of ampulla of Vater: Secondary | ICD-10-CM

## 2023-10-25 NOTE — Progress Notes (Signed)
MRI shows mild intrahepatic biliary dilatation and his total bili has increased to 2.7. Dr. Truett Perna wants him to have CMP this week. Scheduling message sent and patient notified via MyChart.

## 2023-10-26 ENCOUNTER — Other Ambulatory Visit: Payer: Self-pay

## 2023-10-27 ENCOUNTER — Inpatient Hospital Stay: Payer: Medicare Other | Attending: Nurse Practitioner

## 2023-10-27 ENCOUNTER — Inpatient Hospital Stay: Payer: Medicare Other

## 2023-10-27 DIAGNOSIS — Z5111 Encounter for antineoplastic chemotherapy: Secondary | ICD-10-CM | POA: Diagnosis not present

## 2023-10-27 DIAGNOSIS — M48061 Spinal stenosis, lumbar region without neurogenic claudication: Secondary | ICD-10-CM | POA: Diagnosis not present

## 2023-10-27 DIAGNOSIS — R112 Nausea with vomiting, unspecified: Secondary | ICD-10-CM | POA: Diagnosis not present

## 2023-10-27 DIAGNOSIS — Z8719 Personal history of other diseases of the digestive system: Secondary | ICD-10-CM | POA: Diagnosis not present

## 2023-10-27 DIAGNOSIS — R21 Rash and other nonspecific skin eruption: Secondary | ICD-10-CM | POA: Diagnosis not present

## 2023-10-27 DIAGNOSIS — R748 Abnormal levels of other serum enzymes: Secondary | ICD-10-CM | POA: Insufficient documentation

## 2023-10-27 DIAGNOSIS — R6883 Chills (without fever): Secondary | ICD-10-CM | POA: Diagnosis not present

## 2023-10-27 DIAGNOSIS — Z79631 Long term (current) use of antimetabolite agent: Secondary | ICD-10-CM | POA: Diagnosis not present

## 2023-10-27 DIAGNOSIS — C241 Malignant neoplasm of ampulla of Vater: Secondary | ICD-10-CM | POA: Diagnosis not present

## 2023-10-27 DIAGNOSIS — Z9049 Acquired absence of other specified parts of digestive tract: Secondary | ICD-10-CM | POA: Insufficient documentation

## 2023-10-27 DIAGNOSIS — B9681 Helicobacter pylori [H. pylori] as the cause of diseases classified elsewhere: Secondary | ICD-10-CM | POA: Diagnosis not present

## 2023-10-27 DIAGNOSIS — Z79899 Other long term (current) drug therapy: Secondary | ICD-10-CM | POA: Insufficient documentation

## 2023-10-27 LAB — CMP (CANCER CENTER ONLY)
ALT: 37 U/L (ref 0–44)
AST: 36 U/L (ref 15–41)
Albumin: 3.5 g/dL (ref 3.5–5.0)
Alkaline Phosphatase: 154 U/L — ABNORMAL HIGH (ref 38–126)
Anion gap: 5 (ref 5–15)
BUN: 14 mg/dL (ref 8–23)
CO2: 27 mmol/L (ref 22–32)
Calcium: 9 mg/dL (ref 8.9–10.3)
Chloride: 103 mmol/L (ref 98–111)
Creatinine: 0.73 mg/dL (ref 0.61–1.24)
GFR, Estimated: >60 mL/min (ref >60)
Glucose, Bld: 114 mg/dL — ABNORMAL HIGH (ref 70–99)
Potassium: 4.2 mmol/L (ref 3.5–5.1)
Sodium: 135 mmol/L (ref 135–145)
Total Bilirubin: 1.2 mg/dL (ref 0.0–1.2)
Total Protein: 6.7 g/dL (ref 6.5–8.1)

## 2023-10-28 ENCOUNTER — Other Ambulatory Visit: Payer: Medicare Other

## 2023-10-29 ENCOUNTER — Other Ambulatory Visit: Payer: Self-pay

## 2023-10-29 ENCOUNTER — Telehealth: Payer: Self-pay

## 2023-10-29 NOTE — Telephone Encounter (Addendum)
 The patient expressed verbal understanding regarding their condition and inquired about the inflammation. According to the guidelines, the inflammation was indicative of an infection; however, the infection has resolved, as evidenced by the normalization of liver enzyme levels.

## 2023-10-29 NOTE — Telephone Encounter (Signed)
-----   Message from Coni Deep sent at 10/28/2023  7:19 PM EST ----- Please call patient, liver enzymes are now normal, f/u as scheduled, plan to resume chemotherapy next week, MRI with no evidence of recurrent cancer

## 2023-10-30 ENCOUNTER — Other Ambulatory Visit: Payer: Self-pay | Admitting: Oncology

## 2023-11-01 ENCOUNTER — Inpatient Hospital Stay (HOSPITAL_BASED_OUTPATIENT_CLINIC_OR_DEPARTMENT_OTHER): Payer: Medicare Other | Admitting: Oncology

## 2023-11-01 ENCOUNTER — Inpatient Hospital Stay: Payer: Medicare Other

## 2023-11-01 ENCOUNTER — Other Ambulatory Visit: Payer: Self-pay

## 2023-11-01 ENCOUNTER — Other Ambulatory Visit: Payer: Self-pay | Admitting: *Deleted

## 2023-11-01 ENCOUNTER — Other Ambulatory Visit (HOSPITAL_COMMUNITY): Payer: Self-pay

## 2023-11-01 ENCOUNTER — Inpatient Hospital Stay: Payer: Medicare Other | Admitting: *Deleted

## 2023-11-01 VITALS — BP 121/76 | HR 60 | Resp 18

## 2023-11-01 VITALS — BP 132/75 | HR 77 | Temp 98.2°F | Resp 18 | Ht 71.0 in | Wt 173.7 lb

## 2023-11-01 DIAGNOSIS — B9681 Helicobacter pylori [H. pylori] as the cause of diseases classified elsewhere: Secondary | ICD-10-CM | POA: Diagnosis not present

## 2023-11-01 DIAGNOSIS — C241 Malignant neoplasm of ampulla of Vater: Secondary | ICD-10-CM

## 2023-11-01 DIAGNOSIS — R112 Nausea with vomiting, unspecified: Secondary | ICD-10-CM | POA: Diagnosis not present

## 2023-11-01 DIAGNOSIS — R6883 Chills (without fever): Secondary | ICD-10-CM | POA: Diagnosis not present

## 2023-11-01 DIAGNOSIS — R21 Rash and other nonspecific skin eruption: Secondary | ICD-10-CM | POA: Diagnosis not present

## 2023-11-01 DIAGNOSIS — Z5111 Encounter for antineoplastic chemotherapy: Secondary | ICD-10-CM | POA: Diagnosis not present

## 2023-11-01 LAB — CBC WITH DIFFERENTIAL (CANCER CENTER ONLY)
Abs Immature Granulocytes: 0.01 10*3/uL (ref 0.00–0.07)
Basophils Absolute: 0.1 10*3/uL (ref 0.0–0.1)
Basophils Relative: 1 %
Eosinophils Absolute: 0.2 10*3/uL (ref 0.0–0.5)
Eosinophils Relative: 2 %
HCT: 43.3 % (ref 39.0–52.0)
Hemoglobin: 14.5 g/dL (ref 13.0–17.0)
Immature Granulocytes: 0 %
Lymphocytes Relative: 31 %
Lymphs Abs: 2.4 10*3/uL (ref 0.7–4.0)
MCH: 32.3 pg (ref 26.0–34.0)
MCHC: 33.5 g/dL (ref 30.0–36.0)
MCV: 96.4 fL (ref 80.0–100.0)
Monocytes Absolute: 0.7 10*3/uL (ref 0.1–1.0)
Monocytes Relative: 9 %
Neutro Abs: 4.3 10*3/uL (ref 1.7–7.7)
Neutrophils Relative %: 57 %
Platelet Count: 243 10*3/uL (ref 150–400)
RBC: 4.49 MIL/uL (ref 4.22–5.81)
RDW: 13.9 % (ref 11.5–15.5)
WBC Count: 7.7 10*3/uL (ref 4.0–10.5)
nRBC: 0 % (ref 0.0–0.2)

## 2023-11-01 LAB — CMP (CANCER CENTER ONLY)
ALT: 32 U/L (ref 0–44)
AST: 36 U/L (ref 15–41)
Albumin: 3.5 g/dL (ref 3.5–5.0)
Alkaline Phosphatase: 146 U/L — ABNORMAL HIGH (ref 38–126)
Anion gap: 6 (ref 5–15)
BUN: 14 mg/dL (ref 8–23)
CO2: 26 mmol/L (ref 22–32)
Calcium: 8.9 mg/dL (ref 8.9–10.3)
Chloride: 106 mmol/L (ref 98–111)
Creatinine: 0.78 mg/dL (ref 0.61–1.24)
GFR, Estimated: >60 mL/min (ref >60)
Glucose, Bld: 116 mg/dL — ABNORMAL HIGH (ref 70–99)
Potassium: 4.3 mmol/L (ref 3.5–5.1)
Sodium: 138 mmol/L (ref 135–145)
Total Bilirubin: 1 mg/dL (ref 0.0–1.2)
Total Protein: 6.7 g/dL (ref 6.5–8.1)

## 2023-11-01 MED ORDER — SODIUM CHLORIDE 0.9% FLUSH
10.0000 mL | INTRAVENOUS | Status: DC | PRN
  Administered 2023-11-01: 10 mL

## 2023-11-01 MED ORDER — CAPECITABINE 500 MG PO TABS
ORAL_TABLET | ORAL | 0 refills | Status: DC
  Filled 2023-11-01: qty 42, fill #0
  Filled 2023-11-02: qty 42, 14d supply, fill #0

## 2023-11-01 MED ORDER — SODIUM CHLORIDE 0.9 % IV SOLN
Freq: Once | INTRAVENOUS | Status: AC
Start: 2023-11-01 — End: 2023-11-01

## 2023-11-01 MED ORDER — HEPARIN SOD (PORK) LOCK FLUSH 100 UNIT/ML IV SOLN
500.0000 [IU] | Freq: Once | INTRAVENOUS | Status: AC | PRN
  Administered 2023-11-01: 500 [IU]

## 2023-11-01 MED ORDER — SODIUM CHLORIDE 0.9 % IV SOLN
2000.0000 mg | Freq: Once | INTRAVENOUS | Status: AC
  Administered 2023-11-01: 2000 mg via INTRAVENOUS
  Filled 2023-11-01: qty 52.6

## 2023-11-01 MED ORDER — PROCHLORPERAZINE MALEATE 10 MG PO TABS
10.0000 mg | ORAL_TABLET | Freq: Once | ORAL | Status: AC
  Administered 2023-11-01: 10 mg via ORAL
  Filled 2023-11-01: qty 1

## 2023-11-01 NOTE — Progress Notes (Signed)
 Patient seen by Dr. Thornton Papas today  Vitals are within treatment parameters:Yes   Labs are within treatment parameters: Yes   Treatment plan has been signed: Yes   Per physician team, Patient is ready for treatment and there are NO modifications to the treatment plan.

## 2023-11-01 NOTE — Patient Instructions (Signed)
 [facility] A DE[facility] OF [facility]   Discharge Instructions: Thank you for choosing [facility] to provide your oncology and hematology care.   If you have a lab appointment with the Cancer Center, please go directly to the Cancer Center and check in at the registration area.   Wear comfortable clothing and clothing appropriate for easy access to any Portacath or PICC line.   We strive to give you quality time with your provider. You may need to reschedule your appointment if you arrive late (15 or more minutes).  Arriving late affects you and other patients whose appointments are after yours.  Also, if you miss three or more appointments without notifying the office, you may be dismissed from the clinic at the provider's discretion.      For prescription refill requests, have your pharmacy contact our office and allow 72 hours for refills to be completed.    Today you received the following chemotherapy and/or immunotherapy agents Gemcitabine (GEMZAR).      To help prevent nausea and vomiting after your treatment, we encourage you to take your nausea medication as directed.  BELOW ARE SYMPTOMS THAT SHOULD BE REPORTED IMMEDIATELY: *FEVER GREATER THAN 100.4 F (38 C) OR HIGHER *CHILLS OR SWEATING *NAUSEA AND VOMITING THAT IS NOT CONTROLLED WITH YOUR NAUSEA MEDICATION *UNUSUAL SHORTNESS OF BREATH *UNUSUAL BRUISING OR BLEEDING *URINARY PROBLEMS (pain or burning when urinating, or frequent urination) *BOWEL PROBLEMS (unusual diarrhea, constipation, pain near the anus) TENDERNESS IN MOUTH AND THROAT WITH OR WITHOUT PRESENCE OF ULCERS (sore throat, sores in mouth, or a toothache) UNUSUAL RASH, SWELLING OR PAIN  UNUSUAL VAGINAL DISCHARGE OR ITCHING   Items with * indicate a potential emergency and should be followed up as soon as possible or go to the Emergency Department if any problems should occur.  Please show the CHEMOTHERAPY ALERT CARD or  IMMUNOTHERAPY ALERT CARD at check-in to the Emergency Department and triage nurse.  Should you have questions after your visit or need to cancel or reschedule your appointment, please contact [facility] - A DEPT OF [facility]  Dept: [phone]  and follow the prompts.  Office hours are 8:00 a.m. to 4:30 p.m. Monday - Friday. Please note that voicemails left after 4:00 p.m. may not be returned until the following business day.  We are closed weekends and major holidays. You have access to a nurse at all times for urgent questions. Please call the main number to the clinic Dept: [phone] and follow the prompts.   For any non-urgent questions, you may also contact your provider using MyChart. We now offer e-Visits for anyone 57 and older to request care online for non-urgent symptoms. For details visit [email or web address].   Also download the MyChart app! Go to the app store, search "MyChart", open the app, select [facility], and log in with your MyChart username and password.  Gemcitabine Injection What is this medication? GEMCITABINE (jem SYE ta been) treats some types of cancer. It works by slowing down the growth of cancer cells. This medicine may be used for other purposes; ask your health care provider or pharmacist if you have questions. COMMON BRAND NAME(S): Gemzar, Infugem What should I tell my care team before I take this medication? They need to know if you have any of these conditions: Blood disorders Infection Kidney disease Liver disease Lung or breathing disease, such as asthma or COPD Recent or ongoing radiation therapy  An unusual or allergic reaction to gemcitabine, other medications, foods, dyes, or preservatives If you or your partner are pregnant or trying to get pregnant Breast-feeding How should I use this medication? This medication is injected into a vein. It is given by your care team in a hospital or clinic setting. Talk  to your care team about the use of this medication in children. Special care may be needed. Overdosage: If you think you have taken too much of this medicine contact a poison control center or emergency room at once. NOTE: This medicine is only for you. Do not share this medicine with others. What if I miss a dose? Keep appointments for follow-up doses. It is important not to miss your dose. Call your care team if you are unable to keep an appointment. What may interact with this medication? Interactions have not been studied. This list may not describe all possible interactions. Give your health care provider a list of all the medicines, herbs, non-prescription drugs, or dietary supplements you use. Also tell them if you smoke, drink alcohol, or use illegal drugs. Some items may interact with your medicine. What should I watch for while using this medication? Your condition will be monitored carefully while you are receiving this medication. This medication may make you feel generally unwell. This is not uncommon, as chemotherapy can affect healthy cells as well as cancer cells. Report any side effects. Continue your course of treatment even though you feel ill unless your care team tells you to stop. In some cases, you may be given additional medications to help with side effects. Follow all directions for their use. This medication may increase your risk of getting an infection. Call your care team for advice if you get a fever, chills, sore throat, or other symptoms of a cold or flu. Do not treat yourself. Try to avoid being around people who are sick. This medication may increase your risk to bruise or bleed. Call your care team if you notice any unusual bleeding. Be careful brushing or flossing your teeth or using a toothpick because you may get an infection or bleed more easily. If you have any dental work done, tell your dentist you are receiving this medication. Avoid taking medications that  contain aspirin, acetaminophen, ibuprofen, naproxen, or ketoprofen unless instructed by your care team. These medications may hide a fever. Talk to your care team if you or your partner wish to become pregnant or think you might be pregnant. This medication can cause serious birth defects if taken during pregnancy and for 6 months after the last dose. A negative pregnancy test is required before starting this medication. A reliable form of contraception is recommended while taking this medication and for 6 months after the last dose. Talk to your care team about effective forms of contraception. Do not father a child while taking this medication and for 3 months after the last dose. Use a condom while having sex during this time period. Do not breastfeed while taking this medication and for at least 1 week after the last dose. This medication may cause infertility. Talk to your care team if you are concerned about your fertility. What side effects may I notice from receiving this medication? Side effects that you should report to your care team as soon as possible: Allergic reactions--skin rash, itching, hives, swelling of the face, lips, tongue, or throat Capillary leak syndrome--stomach or muscle pain, unusual weakness or fatigue, feeling faint or lightheaded, decrease in the amount  of urine, swelling of the ankles, hands, or feet, trouble breathing Infection--fever, chills, cough, sore throat, wounds that don't heal, pain or trouble when passing urine, general feeling of discomfort or being unwell Liver injury--right upper belly pain, loss of appetite, nausea, light-colored stool, dark yellow or brown urine, yellowing skin or eyes, unusual weakness or fatigue Low red blood cell level--unusual weakness or fatigue, dizziness, headache, trouble breathing Lung injury--shortness of breath or trouble breathing, cough, spitting up blood, chest pain, fever Stomach pain, bloody diarrhea, pale skin, unusual  weakness or fatigue, decrease in the amount of urine, which may be signs of hemolytic uremic syndrome Sudden and severe headache, confusion, change in vision, seizures, which may be signs of posterior reversible encephalopathy syndrome (PRES) Unusual bruising or bleeding Side effects that usually do not require medical attention (report to your care team if they continue or are bothersome): Diarrhea Drowsiness Hair loss Nausea Pain, redness, or swelling with sores inside the mouth or throat Vomiting This list may not describe all possible side effects. Call your doctor for medical advice about side effects. You may report side effects to FDA at 1-800-FDA-1088. Where should I keep my medication? This medication is given in a hospital or clinic. It will not be stored at home. NOTE: This sheet is a summary. It may not cover all possible information. If you have questions about this medicine, talk to your doctor, pharmacist, or health care provider.  2024 Elsevier/Gold Standard (2022-01-13 00:00:00)

## 2023-11-01 NOTE — Progress Notes (Signed)
 [facility] OFFICE PROGRESS NOTE   Diagnosis: Ampullary carcinoma  INTERVAL HISTORY:   Charles Lawson returns as scheduled.  No fever or chills.  No abdominal pain.  He remains off of capecitabine .  Objective:  Vital signs in last 24 hours:  Blood pressure 132/75, pulse 77, temperature 98.2 F (36.8 C), temperature source Temporal, resp. rate 18, height 5\' 11"  (1.803 m), weight 173 lb 11.2 oz (78.8 kg), SpO2 99%.    HEENT: No thrush or ulcers Resp: Lungs clear bilaterally Cardio: Regular rate and rhythm GI: No hepatosplenomegaly, nontender Vascular: No leg edema  Skin: Forearms without rash, Palms without erythema  Portacath/PICC-without erythema  Lab Results:  Lab Results  Component Value Date   WBC 7.7 11/01/2023   HGB 14.5 11/01/2023   HCT 43.3 11/01/2023   MCV 96.4 11/01/2023   PLT 243 11/01/2023   NEUTROABS 4.3 11/01/2023    CMP  Lab Results  Component Value Date   NA 138 11/01/2023   K 4.3 11/01/2023   CL 106 11/01/2023   CO2 26 11/01/2023   GLUCOSE 116 (H) 11/01/2023   BUN 14 11/01/2023   CREATININE 0.78 11/01/2023   CALCIUM  8.9 11/01/2023   PROT 6.7 11/01/2023   ALBUMIN 3.5 11/01/2023   AST 36 11/01/2023   ALT 32 11/01/2023   ALKPHOS 146 (H) 11/01/2023   BILITOT 1.0 11/01/2023   GFRNONAA >60 11/01/2023   GFRAA  11/19/2008    >60        The eGFR has been calculated using the MDRD equation. This calculation has not been validated in all clinical situations. eGFR's persistently <60 mL/min signify possible Chronic Kidney Disease.    Lab Results  Component Value Date   ZOX096 12 06/23/2023     Medications: I have reviewed the patient's current medications.   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,pN1), lymphovascular and perineural invasion present, negative resection margins-closest margins or anterior and posterior  peripancreatic soft tissue surfaces at 0.3 cm Elevated CA 19-9 Cycle 1 day 1 gemcitabine /capecitabine  06/23/2023, day 15 gemcitabine  07/06/2023 Cycle 2-day 1 gemcitabine /capecitabine  07/21/2023, day 15 gemcitabine  08/04/2023 Cycle 3-day 1 gemcitabine  08/18/2023 (capecitabine  08/20/2023 dose reduced), day 15 gemcitabine  09/01/2023 Cycle 4-day 1 gemcitabine /capecitabine  09/20/2023, day 15 gemcitabine  10/04/2023 CT abdomen/pelvis 10/09/2023:-No evidence of recurrent pancreatic cancer, mild enhancement of the common hepatic duct leading up to the enteric anastomosis, mild biliary duct dilation in the left hepatic lobe Cycle 5-day 1 gemcitabine /capecitabine  11/01/2023 Duodenal obstruction August 2024 secondary to #1 H. pylori gastritis 05/10/2023-we will confirm treatment plan with Dr. Willy Harvest Skin rash at the forearms and posterior neck related to Xeloda  Right foot drop-MRI lumbar spine 07/16/2023 with no evidence of metastatic disease.  L2-L3 mild spinal canal stenosis.  L3-L4 mild to moderate left neural foraminal narrowing.  L5-S1 mild bilateral neural foraminal narrowing.  Narrowing of the left lateral recess at L1-L2. Elevated liver enzymes January 2025 MRI/MRCP 10/22/2023: No evidence of metastatic disease, mild Intermatic biliary dilation, uniform thickening of the common bile duct with enhancement Liver enzymes normal 10/27/2023     Disposition: Charles Lawson appears well.  Elevation of liver enzymes has resolved.  Dr. Willy Harvest has reviewed the MRI and laboratory findings.  The plan is to proceed with gemcitabine /capecitabine .  We will consult GI for current elevation of liver enzymes.  Charles Lawson call for a fever, chills, or a progressive rash.  He will resume adjuvant gemcitabine /capecitabine  today.  He will return for an office and  lab visit in 2 weeks.  Coni Deep, MD  11/01/2023  9:39 AM

## 2023-11-02 ENCOUNTER — Other Ambulatory Visit (HOSPITAL_COMMUNITY): Payer: Self-pay

## 2023-11-02 ENCOUNTER — Other Ambulatory Visit: Payer: Self-pay

## 2023-11-02 NOTE — Progress Notes (Signed)
Specialty Pharmacy Refill Coordination Note  Charles Lawson is a 83 y.o. male contacted today regarding refills of specialty medication(s) Capecitabine (XELODA)   Patient requested Delivery   Delivery date: 11/04/23   Verified address: Patient address [address]  [place] [address]   Medication will be filled on 11/03/23.

## 2023-11-04 ENCOUNTER — Telehealth: Payer: Self-pay | Admitting: *Deleted

## 2023-11-04 MED ORDER — ONDANSETRON HCL 8 MG PO TABS: 8.0000 mg | ORAL_TABLET | Freq: Three times a day (TID) | ORAL | 1 refills | Status: DC | PRN

## 2023-11-04 MED ORDER — PROCHLORPERAZINE MALEATE 10 MG PO TABS: 10.0000 mg | ORAL_TABLET | Freq: Four times a day (QID) | ORAL | 1 refills | Status: DC | PRN

## 2023-11-04 NOTE — Telephone Encounter (Addendum)
Charles Lawson called to report Charles Lawson had had nausea and vomited x 1 about 2 hours ago (feels better now). Has been having chills, but no fever. No body/muscle aches or abdominal pain. Has taken compazine x 1, but not the zofran. Instructed her to have him take the prochlorperazine every 6 hours for the next 24 hours and supplement with the ondansetron every 8 hours if needed (OK to take 1 hour prior to each Xeloda dose). Stay with liquid diet and slowly advanced tomorrow if feeling better. Continue to monitor temp and call if he develops fever or any s/s infection. She requested refills on the antiemetics. MD notified. Per Dr. Truett Perna: Will check CMP tomorrow if still having chills. ? Could be from Gemzar or biliary issue. Charles Lawson agrees to call first thing in am with update.

## 2023-11-10 ENCOUNTER — Other Ambulatory Visit: Payer: Self-pay

## 2023-11-10 ENCOUNTER — Other Ambulatory Visit: Payer: Self-pay | Admitting: Internal Medicine

## 2023-11-11 ENCOUNTER — Other Ambulatory Visit: Payer: Self-pay | Admitting: Oncology

## 2023-11-15 ENCOUNTER — Inpatient Hospital Stay (HOSPITAL_BASED_OUTPATIENT_CLINIC_OR_DEPARTMENT_OTHER): Payer: Medicare Other | Admitting: Nurse Practitioner

## 2023-11-15 ENCOUNTER — Other Ambulatory Visit: Payer: Self-pay

## 2023-11-15 ENCOUNTER — Inpatient Hospital Stay: Payer: Medicare Other

## 2023-11-15 ENCOUNTER — Encounter: Payer: Self-pay | Admitting: Nurse Practitioner

## 2023-11-15 VITALS — BP 118/78 | HR 86 | Temp 98.1°F | Resp 18 | Ht 71.0 in | Wt 169.8 lb

## 2023-11-15 DIAGNOSIS — C241 Malignant neoplasm of ampulla of Vater: Secondary | ICD-10-CM

## 2023-11-15 DIAGNOSIS — Z95828 Presence of other vascular implants and grafts: Secondary | ICD-10-CM

## 2023-11-15 LAB — CBC WITH DIFFERENTIAL (CANCER CENTER ONLY)
Abs Immature Granulocytes: 0.02 10*3/uL (ref 0.00–0.07)
Basophils Absolute: 0 10*3/uL (ref 0.0–0.1)
Basophils Relative: 0 %
Eosinophils Absolute: 0.1 10*3/uL (ref 0.0–0.5)
Eosinophils Relative: 2 %
HCT: 42.1 % (ref 39.0–52.0)
Hemoglobin: 14.2 g/dL (ref 13.0–17.0)
Immature Granulocytes: 0 %
Lymphocytes Relative: 32 %
Lymphs Abs: 2.2 10*3/uL (ref 0.7–4.0)
MCH: 32.2 pg (ref 26.0–34.0)
MCHC: 33.7 g/dL (ref 30.0–36.0)
MCV: 95.5 fL (ref 80.0–100.0)
Monocytes Absolute: 0.6 10*3/uL (ref 0.1–1.0)
Monocytes Relative: 8 %
Neutro Abs: 3.8 10*3/uL (ref 1.7–7.7)
Neutrophils Relative %: 58 %
Platelet Count: 233 10*3/uL (ref 150–400)
RBC: 4.41 MIL/uL (ref 4.22–5.81)
RDW: 15.1 % (ref 11.5–15.5)
WBC Count: 6.8 10*3/uL (ref 4.0–10.5)
nRBC: 0 % (ref 0.0–0.2)

## 2023-11-15 LAB — CMP (CANCER CENTER ONLY)
ALT: 56 U/L — ABNORMAL HIGH (ref 0–44)
AST: 47 U/L — ABNORMAL HIGH (ref 15–41)
Albumin: 3.9 g/dL (ref 3.5–5.0)
Alkaline Phosphatase: 235 U/L — ABNORMAL HIGH (ref 38–126)
Anion gap: 8 (ref 5–15)
BUN: 12 mg/dL (ref 8–23)
CO2: 26 mmol/L (ref 22–32)
Calcium: 9.1 mg/dL (ref 8.9–10.3)
Chloride: 101 mmol/L (ref 98–111)
Creatinine: 0.8 mg/dL (ref 0.61–1.24)
GFR, Estimated: >60 mL/min (ref >60)
Glucose, Bld: 127 mg/dL — ABNORMAL HIGH (ref 70–99)
Potassium: 4.4 mmol/L (ref 3.5–5.1)
Sodium: 135 mmol/L (ref 135–145)
Total Bilirubin: 1.9 mg/dL — ABNORMAL HIGH (ref 0.0–1.2)
Total Protein: 7.1 g/dL (ref 6.5–8.1)

## 2023-11-15 MED ORDER — HEPARIN SOD (PORK) LOCK FLUSH 100 UNIT/ML IV SOLN
500.0000 [IU] | Freq: Once | INTRAVENOUS | Status: AC
  Administered 2023-11-15: 500 [IU] via INTRAVENOUS

## 2023-11-15 MED ORDER — SODIUM CHLORIDE 0.9% FLUSH
10.0000 mL | INTRAVENOUS | Status: DC | PRN
  Administered 2023-11-15: 10 mL via INTRAVENOUS

## 2023-11-15 MED ORDER — SODIUM CHLORIDE 0.9% FLUSH
10.0000 mL | INTRAVENOUS | Status: DC | PRN
Start: 2023-11-15 — End: 2023-11-15
  Administered 2023-11-15: 10 mL via INTRAVENOUS

## 2023-11-15 MED ORDER — LIDOCAINE-PRILOCAINE 2.5-2.5 % EX CREA: 1.0000 | TOPICAL_CREAM | CUTANEOUS | 2 refills | Status: DC | PRN

## 2023-11-15 MED ORDER — CAPECITABINE 500 MG PO TABS
ORAL_TABLET | ORAL | 0 refills | Status: DC
  Filled 2023-11-15 – 2023-12-01 (×2): qty 63, fill #0
  Filled 2023-12-01: qty 49, 16d supply, fill #0

## 2023-11-15 NOTE — Patient Instructions (Signed)

## 2023-11-15 NOTE — Addendum Note (Signed)
 Addended by: Dimitri Ped on: 11/15/2023 01:39 PM   Modules accepted: Orders

## 2023-11-15 NOTE — Progress Notes (Signed)
 [facility] OFFICE PROGRESS NOTE   Diagnosis: Ampullary carcinoma  INTERVAL HISTORY:   Charles Lawson returns as scheduled.  He began cycle 5-day 1 gemcitabine/capecitabine on 11/01/2023.  On day 2 he had an episode of nausea/vomiting and chills.  No fever.  No mouth sores.  No diarrhea.  No hand or foot pain or redness.  No rash.  Objective:  Vital signs in last 24 hours:  Blood pressure 118/78, pulse 86, temperature 98.1 F (36.7 C), resp. rate 18, height 5\' 11"  (1.803 m), weight 169 lb 12.8 oz (77 kg), SpO2 100%.    HEENT: No thrush or ulcers. Resp: Lungs clear bilaterally. Cardio: Regular rate and rhythm. GI: Abdomen soft and nontender.  No hepatosplenomegaly. Vascular: No leg edema. Skin: Palms without erythema. Port-A-Cath without erythema.  Lab Results:  Lab Results  Component Value Date   WBC 6.8 11/15/2023   HGB 14.2 11/15/2023   HCT 42.1 11/15/2023   MCV 95.5 11/15/2023   PLT 233 11/15/2023   NEUTROABS 3.8 11/15/2023    Imaging:  No results found.  Medications: I have reviewed the patient's current medications.  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,pN1), lymphovascular and perineural invasion present, negative resection margins-closest margins or anterior and posterior peripancreatic soft tissue surfaces at 0.3 cm Elevated CA 19-9 Cycle 1 day 1 gemcitabine/capecitabine 06/23/2023, day 15 gemcitabine 07/06/2023 Cycle 2-day 1 gemcitabine/capecitabine 07/21/2023, day 15 gemcitabine 08/04/2023 Cycle 3-day 1 gemcitabine 08/18/2023 (capecitabine 08/20/2023 dose reduced), day 15 gemcitabine 09/01/2023 Cycle 4-day 1 gemcitabine/capecitabine 09/20/2023, day 15 gemcitabine 10/04/2023 CT abdomen/pelvis 10/09/2023:-No evidence of recurrent pancreatic cancer, mild enhancement of the common hepatic duct leading up to the enteric anastomosis, mild biliary  duct dilation in the left hepatic lobe Cycle 5-day 1 gemcitabine/capecitabine 11/01/2023; day 15 Gemcitabine held 11/15/2023 due to elevated liver enzymes Duodenal obstruction August 2024 secondary to #1 H. pylori gastritis 05/10/2023-we will confirm treatment plan with Dr. 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  L2-L3 mild spinal canal stenosis.  L3-L4 mild to moderate left neural foraminal narrowing.  L5-S1 mild bilateral neural foraminal narrowing.  Narrowing of the left lateral recess at L1-L2. Elevated liver enzymes January 2025 MRI/MRCP 10/22/2023: No evidence of metastatic disease, mild intrahepatic biliary dilation, uniform thickening of the common bile duct with enhancement Liver enzymes normal 10/27/2023    Disposition: Charles Lawson appears stable.  He is completing cycle 5 gemcitabine/capecitabine.  Today he is scheduled for day 15 Gemcitabine.  Review of labs show recurrent elevation of liver enzymes.  We are holding Gemcitabine today.  He will continue capecitabine.  He will return for a follow-up chemistry panel in 1 week.  Referral placed to Dr. Meridee Score for evaluation of elevated liver enzymes.  He will return for an office visit and possible treatment in 2 weeks.  We are available to see him sooner if needed.  He understands to contact the office with fever, chills, other signs of infection.  Patient seen with Dr. Truett Perna.  Lonna Cobb ANP/GNP-BC   11/15/2023  12:11 PM  This was a shared visit with Lonna Cobb.  Charles Lawson has again developed elevation of the liver enzymes.  Is unclear whether the liver enzyme elevation is related to a biliary process or toxicity from chemotherapy.  We decided to continue capecitabine and hold gemcitabine for now.  He will be referred to gastroenterology to  consider the indication for an ERCP.  I was present for greater than 50% of  today's visit.  I performed medical decision making.  Mancel Bale, MD

## 2023-11-16 ENCOUNTER — Other Ambulatory Visit: Payer: Self-pay | Admitting: Internal Medicine

## 2023-11-16 NOTE — Telephone Encounter (Signed)
 Last Fill: 11/10/23 90 caps/0 refills  Last OV: Unknown Next OV: 12/28/23  Routing to provider for review/authorization.

## 2023-11-16 NOTE — Telephone Encounter (Signed)
 Copied from CRM [id number]. Topic: Clinical - Medication Refill >> Nov 16, 2023  9:49 AM Orinda Kenner C wrote: Most Recent Primary Care Visit:  Provider: Wyvonne Lenz  Department: [facility]  Visit Type: MEDICARE AWV, SEQUENTIAL  Date: 08/09/2023  Medication: tamsulosin (FLOMAX) 0.4 MG CAPS capsule   Has the patient contacted their pharmacy? Yes (Agent: If no, request that the patient contact the pharmacy for the refill. If patient does not wish to contact the pharmacy document the reason why and proceed with request.) (Agent: If yes, when and what did the pharmacy advise?)  Is this the correct pharmacy for this prescription? Yes If no, delete pharmacy and type the correct one.  This is the patient's preferred pharmacy:  [id number] [place], [place] - [address] [place] [address] Phone: [phone] Fax: [phone]   Has the prescription been filled recently? No  Is the patient out of the medication? Yes  Has the patient been seen for an appointment in the last year OR does the patient have an upcoming appointment? Yes  Can we respond through MyChart? No, Please contact patient's spouse Corrie Dandy [phone]  Agent: Please be advised that Rx refills may take up to 3 business days. We ask that you follow-up with your pharmacy.

## 2023-11-17 ENCOUNTER — Encounter (HOSPITAL_BASED_OUTPATIENT_CLINIC_OR_DEPARTMENT_OTHER): Payer: Self-pay

## 2023-11-17 ENCOUNTER — Emergency Department (HOSPITAL_BASED_OUTPATIENT_CLINIC_OR_DEPARTMENT_OTHER): Payer: Medicare Other

## 2023-11-17 ENCOUNTER — Inpatient Hospital Stay (HOSPITAL_BASED_OUTPATIENT_CLINIC_OR_DEPARTMENT_OTHER)
Admission: EM | Admit: 2023-11-17 | Discharge: 2023-11-21 | DRG: 871 | Disposition: A | Discharge status: Home / Self Care | Payer: Medicare Other | Attending: Internal Medicine | Admitting: Internal Medicine

## 2023-11-17 ENCOUNTER — Other Ambulatory Visit: Payer: Self-pay

## 2023-11-17 DIAGNOSIS — D649 Anemia, unspecified: Secondary | ICD-10-CM | POA: Diagnosis present

## 2023-11-17 DIAGNOSIS — E785 Hyperlipidemia, unspecified: Secondary | ICD-10-CM | POA: Diagnosis present

## 2023-11-17 DIAGNOSIS — E876 Hypokalemia: Secondary | ICD-10-CM | POA: Diagnosis present

## 2023-11-17 DIAGNOSIS — Z9049 Acquired absence of other specified parts of digestive tract: Secondary | ICD-10-CM

## 2023-11-17 DIAGNOSIS — R932 Abnormal findings on diagnostic imaging of liver and biliary tract: Secondary | ICD-10-CM | POA: Diagnosis not present

## 2023-11-17 DIAGNOSIS — M4800 Spinal stenosis, site unspecified: Secondary | ICD-10-CM | POA: Diagnosis present

## 2023-11-17 DIAGNOSIS — R6520 Severe sepsis without septic shock: Secondary | ICD-10-CM | POA: Diagnosis present

## 2023-11-17 DIAGNOSIS — K8309 Other cholangitis: Secondary | ICD-10-CM | POA: Diagnosis not present

## 2023-11-17 DIAGNOSIS — R1013 Epigastric pain: Secondary | ICD-10-CM | POA: Diagnosis present

## 2023-11-17 DIAGNOSIS — C259 Malignant neoplasm of pancreas, unspecified: Principal | ICD-10-CM

## 2023-11-17 DIAGNOSIS — K831 Obstruction of bile duct: Secondary | ICD-10-CM | POA: Diagnosis present

## 2023-11-17 DIAGNOSIS — Z1152 Encounter for screening for COVID-19: Secondary | ICD-10-CM

## 2023-11-17 DIAGNOSIS — C779 Secondary and unspecified malignant neoplasm of lymph node, unspecified: Secondary | ICD-10-CM | POA: Diagnosis present

## 2023-11-17 DIAGNOSIS — Z90411 Acquired partial absence of pancreas: Secondary | ICD-10-CM

## 2023-11-17 DIAGNOSIS — R935 Abnormal findings on diagnostic imaging of other abdominal regions, including retroperitoneum: Secondary | ICD-10-CM

## 2023-11-17 DIAGNOSIS — C241 Malignant neoplasm of ampulla of Vater: Secondary | ICD-10-CM | POA: Diagnosis present

## 2023-11-17 DIAGNOSIS — Z860100 Personal history of colon polyps, unspecified: Secondary | ICD-10-CM

## 2023-11-17 DIAGNOSIS — Z79899 Other long term (current) drug therapy: Secondary | ICD-10-CM | POA: Diagnosis not present

## 2023-11-17 DIAGNOSIS — Z85828 Personal history of other malignant neoplasm of skin: Secondary | ICD-10-CM | POA: Diagnosis not present

## 2023-11-17 DIAGNOSIS — Z801 Family history of malignant neoplasm of trachea, bronchus and lung: Secondary | ICD-10-CM | POA: Diagnosis not present

## 2023-11-17 DIAGNOSIS — I700 Atherosclerosis of aorta: Secondary | ICD-10-CM | POA: Diagnosis not present

## 2023-11-17 DIAGNOSIS — Z880 Allergy status to penicillin: Secondary | ICD-10-CM | POA: Diagnosis not present

## 2023-11-17 DIAGNOSIS — K5730 Diverticulosis of large intestine without perforation or abscess without bleeding: Secondary | ICD-10-CM | POA: Diagnosis not present

## 2023-11-17 DIAGNOSIS — K219 Gastro-esophageal reflux disease without esophagitis: Secondary | ICD-10-CM | POA: Diagnosis present

## 2023-11-17 DIAGNOSIS — Z8507 Personal history of malignant neoplasm of pancreas: Secondary | ICD-10-CM | POA: Diagnosis not present

## 2023-11-17 DIAGNOSIS — D72829 Elevated white blood cell count, unspecified: Secondary | ICD-10-CM | POA: Diagnosis not present

## 2023-11-17 DIAGNOSIS — K869 Disease of pancreas, unspecified: Secondary | ICD-10-CM | POA: Diagnosis not present

## 2023-11-17 DIAGNOSIS — K8689 Other specified diseases of pancreas: Secondary | ICD-10-CM | POA: Diagnosis not present

## 2023-11-17 DIAGNOSIS — A419 Sepsis, unspecified organism: Secondary | ICD-10-CM | POA: Diagnosis present

## 2023-11-17 DIAGNOSIS — Z8619 Personal history of other infectious and parasitic diseases: Secondary | ICD-10-CM

## 2023-11-17 DIAGNOSIS — N400 Enlarged prostate without lower urinary tract symptoms: Secondary | ICD-10-CM | POA: Diagnosis present

## 2023-11-17 DIAGNOSIS — R109 Unspecified abdominal pain: Secondary | ICD-10-CM | POA: Diagnosis not present

## 2023-11-17 DIAGNOSIS — Z825 Family history of asthma and other chronic lower respiratory diseases: Secondary | ICD-10-CM | POA: Diagnosis not present

## 2023-11-17 DIAGNOSIS — R188 Other ascites: Secondary | ICD-10-CM | POA: Diagnosis not present

## 2023-11-17 DIAGNOSIS — R0602 Shortness of breath: Secondary | ICD-10-CM | POA: Diagnosis not present

## 2023-11-17 DIAGNOSIS — R918 Other nonspecific abnormal finding of lung field: Secondary | ICD-10-CM | POA: Diagnosis not present

## 2023-11-17 DIAGNOSIS — Z90410 Acquired total absence of pancreas: Secondary | ICD-10-CM

## 2023-11-17 LAB — URINALYSIS, ROUTINE W REFLEX MICROSCOPIC
Bacteria, UA: NONE SEEN
Glucose, UA: NEGATIVE mg/dL
Hgb urine dipstick: NEGATIVE
Ketones, ur: NEGATIVE mg/dL
Leukocytes,Ua: NEGATIVE
Nitrite: NEGATIVE
Protein, ur: NEGATIVE mg/dL
Specific Gravity, Urine: 1.018 (ref 1.005–1.030)
pH: 5.5 (ref 5.0–8.0)

## 2023-11-17 LAB — CBC
HCT: 41.2 % (ref 39.0–52.0)
Hemoglobin: 13.8 g/dL (ref 13.0–17.0)
MCH: 32.5 pg (ref 26.0–34.0)
MCHC: 33.5 g/dL (ref 30.0–36.0)
MCV: 96.9 fL (ref 80.0–100.0)
Platelets: 303 10*3/uL (ref 150–400)
RBC: 4.25 MIL/uL (ref 4.22–5.81)
RDW: 15.3 % (ref 11.5–15.5)
WBC: 20.4 10*3/uL — ABNORMAL HIGH (ref 4.0–10.5)
nRBC: 0 % (ref 0.0–0.2)

## 2023-11-17 LAB — COMPREHENSIVE METABOLIC PANEL
ALT: 94 U/L — ABNORMAL HIGH (ref 0–44)
AST: 141 U/L — ABNORMAL HIGH (ref 15–41)
Albumin: 3.8 g/dL (ref 3.5–5.0)
Alkaline Phosphatase: 279 U/L — ABNORMAL HIGH (ref 38–126)
Anion gap: 9 (ref 5–15)
BUN: 9 mg/dL (ref 8–23)
CO2: 26 mmol/L (ref 22–32)
Calcium: 9.1 mg/dL (ref 8.9–10.3)
Chloride: 101 mmol/L (ref 98–111)
Creatinine, Ser: 0.92 mg/dL (ref 0.61–1.24)
GFR, Estimated: >60 mL/min (ref >60)
Glucose, Bld: 160 mg/dL — ABNORMAL HIGH (ref 70–99)
Potassium: 3.8 mmol/L (ref 3.5–5.1)
Sodium: 136 mmol/L (ref 135–145)
Total Bilirubin: 3.3 mg/dL — ABNORMAL HIGH (ref 0.0–1.2)
Total Protein: 6.6 g/dL (ref 6.5–8.1)

## 2023-11-17 LAB — LIPASE, BLOOD: Lipase: <10 U/L — ABNORMAL LOW (ref 11–51)

## 2023-11-17 LAB — RESP PANEL BY RT-PCR (RSV, FLU A&B, COVID)  RVPGX2
Influenza A by PCR: NEGATIVE
Influenza B by PCR: NEGATIVE
Resp Syncytial Virus by PCR: NEGATIVE
SARS Coronavirus 2 by RT PCR: NEGATIVE

## 2023-11-17 LAB — LACTIC ACID, PLASMA: Lactic Acid, Venous: 2.9 mmol/L (ref 0.5–1.9)

## 2023-11-17 MED ORDER — MORPHINE SULFATE (PF) 4 MG/ML IV SOLN
4.0000 mg | Freq: Once | INTRAVENOUS | Status: AC
  Administered 2023-11-17: 4 mg via INTRAVENOUS
  Filled 2023-11-17: qty 1

## 2023-11-17 MED ORDER — CIPROFLOXACIN IN D5W 400 MG/200ML IV SOLN
400.0000 mg | Freq: Two times a day (BID) | INTRAVENOUS | Status: DC
  Administered 2023-11-17 – 2023-11-21 (×8): 400 mg via INTRAVENOUS
  Filled 2023-11-17 (×8): qty 200

## 2023-11-17 MED ORDER — METRONIDAZOLE 500 MG/100ML IV SOLN
500.0000 mg | Freq: Two times a day (BID) | INTRAVENOUS | Status: DC
  Administered 2023-11-17 – 2023-11-21 (×8): 500 mg via INTRAVENOUS
  Filled 2023-11-17 (×8): qty 100

## 2023-11-17 MED ORDER — IOHEXOL 300 MG/ML  SOLN
100.0000 mL | Freq: Once | INTRAMUSCULAR | Status: AC | PRN
  Administered 2023-11-17: 85 mL via INTRAVENOUS

## 2023-11-17 MED ORDER — SODIUM CHLORIDE 0.9 % IV BOLUS
1000.0000 mL | Freq: Once | INTRAVENOUS | Status: AC
  Administered 2023-11-17: 1000 mL via INTRAVENOUS

## 2023-11-17 MED ORDER — ACETAMINOPHEN 325 MG PO TABS
650.0000 mg | ORAL_TABLET | Freq: Once | ORAL | Status: AC
  Administered 2023-11-17: 650 mg via ORAL
  Filled 2023-11-17: qty 2

## 2023-11-17 MED ORDER — SODIUM CHLORIDE 0.9 % IV SOLN: Freq: Once | INTRAVENOUS | Status: AC

## 2023-11-17 NOTE — ED Triage Notes (Signed)
 In for eval of abd pain onset 2-3 hours ago with nausea. Wife administered zofran. 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 Was unable to perform chemo Monday due to elevated bilirubin.

## 2023-11-17 NOTE — ED Provider Notes (Incomplete)
 [place] EMERGENCY DEPARTMENT AT [facility] Provider Note   CSN: [id number] Arrival date & time: 11/17/23  1651     History {Add pertinent medical, surgical, social history, OB history to HPI:1} Chief Complaint  Patient presents with   Abdominal Pain    CORNELUIS ALLSTON is a 83 y.o. male.  Patient is an 83 year old male with past medical history of pancreatic cancer, Whipple, and chemotherapy-began cycle 5-day 1 gemcitabine/capecitabine on 11/01/2023 presenting for complaints of epigastric abdominal pain, nausea without vomiting, and chills.  Patient being treated by oncologist Dr. Thornton Papas.  Patient denies any melena or hematochezia.  Denies diarrhea.  Denies chest pain, shortness of breath, coughing.  Was told he was unable to perform chemotherapy this past Monday due to elevated bilirubin levels.  The history is provided by the patient.  Abdominal Pain Associated symptoms: nausea   Associated symptoms: no chest pain, no chills, no cough, no diarrhea, no dysuria, no fever, no hematuria, no shortness of breath, no sore throat and no vomiting        Home Medications Prior to Admission medications   Medication Sig Start Date End Date Taking? Authorizing Provider  acetaminophen (TYLENOL) 650 MG CR tablet Take 500 mg by mouth every 8 (eight) hours as needed for pain. 05/27/23: reports during [facility] call taking after recent surgery at [facility] Patient not taking: Reported on 08/18/2023    Corwin Levins, MD  capecitabine (XELODA) 500 MG tablet Take #1 tablet (500 mg) in am and #2 tablets (1000 mg) in pm. Take for 21 days, then hold for 7 days. Repeat every 28 days. Start cycle on 11/29/2023 11/15/23   Rana Snare, NP  lidocaine-prilocaine (EMLA) cream Apply 1 Application topically as needed. 11/15/23   Rana Snare, NP  lipase/protease/amylase (CREON) 12000-38000 units CPEP capsule Take 1 capsule (12,000 Units total) by mouth 3 (three) times daily before meals. 07/22/23   Rana Snare, NP  omeprazole (PRILOSEC) 20 MG capsule Take 1 capsule (20 mg total) by mouth daily. 08/16/23   Corwin Levins, MD  ondansetron (ZOFRAN) 8 MG tablet Take 1 tablet (8 mg total) by mouth every 8 (eight) hours as needed for nausea. 11/04/23   Ladene Artist, MD  potassium chloride (KLOR-CON M) 10 MEQ tablet Take 1 tablet (10 mEq total) by mouth 2 (two) times daily. 08/23/23   Ladene Artist, MD  prochlorperazine (COMPAZINE) 10 MG tablet Take 1 tablet (10 mg total) by mouth every 6 (six) hours as needed for nausea or vomiting. 11/04/23   Ladene Artist, MD  tamsulosin (FLOMAX) 0.4 MG CAPS capsule TAKE 1 CAPSULE (0.4 MG TOTAL) BY MOUTH DAILY. 05/27/23: REPORTS DURING TOC CALL TAKING AFTER RECENT SURGERY AT [facility] 11/10/23   Corwin Levins, MD      Allergies    Penicillins    Review of Systems   Review of Systems  Constitutional:  Negative for chills and fever.  HENT:  Negative for ear pain and sore throat.   Eyes:  Negative for pain and visual disturbance.  Respiratory:  Negative for cough and shortness of breath.   Cardiovascular:  Negative for chest pain and palpitations.  Gastrointestinal:  Positive for abdominal pain and nausea. Negative for diarrhea and vomiting.  Genitourinary:  Negative for dysuria and hematuria.  Musculoskeletal:  Negative for arthralgias and back pain.  Skin:  Negative for color change and rash.  Neurological:  Negative for seizures and syncope.  All other systems reviewed  and are negative.   Physical Exam Updated Vital Signs BP 130/71 (BP Location: Right Arm)   Pulse 91   Temp 98.2 F (36.8 C) (Oral)   Resp 17   Ht 5\' 11"  (1.803 m)   Wt 77 kg   SpO2 98%   BMI 23.68 kg/m  Physical Exam Vitals and nursing note reviewed.  Constitutional:      General: He is not in acute distress.    Appearance: He is well-developed.  HENT:     Head: Normocephalic and atraumatic.  Eyes:     Conjunctiva/sclera: Conjunctivae normal.  Cardiovascular:     Rate and  Rhythm: Normal rate and regular rhythm.     Heart sounds: No murmur heard. Pulmonary:     Effort: Pulmonary effort is normal. No respiratory distress.     Breath sounds: Normal breath sounds.  Abdominal:     Palpations: Abdomen is soft.     Tenderness: There is abdominal tenderness in the epigastric area. There is guarding. There is no rebound.  Musculoskeletal:        General: No swelling.     Cervical back: Neck supple.  Skin:    General: Skin is warm and dry.     Capillary Refill: Capillary refill takes less than 2 seconds.  Neurological:     Mental Status: He is alert.  Psychiatric:        Mood and Affect: Mood normal.     ED Results / Procedures / Treatments   Labs (all labs ordered are listed, but only abnormal results are displayed) Labs Reviewed  LIPASE, BLOOD - Abnormal; Notable for the following components:      Result Value   Lipase <10 (*)    All other components within normal limits  COMPREHENSIVE METABOLIC PANEL - Abnormal; Notable for the following components:   Glucose, Bld 160 (*)    AST 141 (*)    ALT 94 (*)    Alkaline Phosphatase 279 (*)    Total Bilirubin 3.3 (*)    All other components within normal limits  CBC - Abnormal; Notable for the following components:   WBC 20.4 (*)    All other components within normal limits  RESP PANEL BY RT-PCR (RSV, FLU A&B, COVID)  RVPGX2  URINALYSIS, ROUTINE W REFLEX MICROSCOPIC    EKG None  Radiology No results found.  Procedures Procedures  {Document cardiac monitor, telemetry assessment procedure when appropriate:1}  Medications Ordered in ED Medications  morphine (PF) 4 MG/ML injection 4 mg (has no administration in time range)    ED Course/ Medical Decision Making/ A&P   {   Click here for ABCD2, HEART and other calculatorsREFRESH Note before signing :1}                              Medical Decision Making Amount and/or Complexity of Data Reviewed Labs: ordered.  Risk Prescription drug  management. Decision regarding hospitalization.   83 year old male with past medical history of pancreatic cancer, resection, and rightly on chemotherapy-began cycle 5-day 1 gemcitabine/capecitabine on 11/01/2023 senting for complaints of epigastric abdominal pain, nausea without vomiting, and chills.  Patient is alert oriented x 3, afebrile, with stable vital signs.  Physical exam demonstrates epigastric tenderness with guarding.  No distention or rigidity.  Gwyndolyn Kaufman studies concerning for leukocytosis of 20.4 with white blood cell count of 6.8 two days ago.  Patient's bili Ruben 3.3 today up from 1.92 days ago.  Chart review demonstrates patient had MRCP on 10/22/2023 that demonstrates status post Whipple procedure with expected postoperative changes.  No complicating features.  No findings to suggest metastatic disease involving the abdomen.  Mild intrahepatic biliary dilation.  Common bile duct is normal caliber but wall is uniformly thickened and contrast-enhancement which would suggest inflammation/cholangitis.  I spoke with oncologist on-call Dr. Shirline Frees who recommends starting prophylactic antibiotics for intra-abdominal coverage due to uptrending white count and admission to [facility] long under hospitalist service.  He recommends Dr. Cristal Ford and patient's established oncologist Dr. Greggory Brandy to the treatment team.  Patient has penicillin allergy.  Cipro and Flagyl started.   {Document critical care time when appropriate:1} {Document review of labs and clinical decision tools ie heart score, Chads2Vasc2 etc:1}  {Document your independent review of radiology images, and any outside records:1} {Document your discussion with family members, caretakers, and with consultants:1} {Document social determinants of health affecting pt's care:1} {Document your decision making why or why not admission, treatments were needed:1} Final Clinical Impression(s) / ED Diagnoses Final diagnoses:  None    Rx  / DC Orders ED Discharge Orders     None

## 2023-11-17 NOTE — Progress Notes (Addendum)
 Plan of Care Note for accepted transfer   Patient: Charles Lawson MRN: [id number]   DOA: 11/17/2023  Facility requesting transfer: [facility] Requesting Provider: Dr. 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  Patient last received infusion of capecitabine on 11/15/2023.  Gemcitabine was held due to elevated liver enzymes.  Labs today notable for new leukocytosis WBC 20.4, increasing LFTs with AST 141, ALT 94, alk phos 279, total bilirubin 3.3.  Lipase <10.  EDP discussed with on-call oncology (Dr. Arbutus Ped) who recommended starting empiric antibiotics, medical admission to [facility], and have patient's primary oncologist Dr. Truett Perna see the patient tomorrow.  Patient has been started on IV Cipro/Flagyl.  [facility] hospitalists contacted for admission.  I have requested obtaining imaging with CT abdomen/pelvis with contrast as well as obtaining blood cultures.  Plan of care: The patient is accepted for admission to Med-surg  unit, at [facility].  Author: Darreld Mclean, MD 11/17/2023  Check [email or web address] for on-call coverage.  Nursing staff, Please call [facility] Admits & Consults System-Wide number on Amion as soon as patient's arrival, so appropriate admitting provider can evaluate the pt.

## 2023-11-17 NOTE — ED Notes (Signed)
 Patient is in the restroom obtaining a urine sample.

## 2023-11-18 ENCOUNTER — Other Ambulatory Visit: Payer: Self-pay

## 2023-11-18 ENCOUNTER — Telehealth: Payer: Self-pay | Admitting: *Deleted

## 2023-11-18 ENCOUNTER — Inpatient Hospital Stay (HOSPITAL_COMMUNITY): Payer: Medicare Other

## 2023-11-18 DIAGNOSIS — D649 Anemia, unspecified: Secondary | ICD-10-CM | POA: Diagnosis present

## 2023-11-18 DIAGNOSIS — R932 Abnormal findings on diagnostic imaging of liver and biliary tract: Secondary | ICD-10-CM | POA: Diagnosis not present

## 2023-11-18 DIAGNOSIS — K831 Obstruction of bile duct: Secondary | ICD-10-CM | POA: Diagnosis present

## 2023-11-18 DIAGNOSIS — A419 Sepsis, unspecified organism: Secondary | ICD-10-CM | POA: Diagnosis present

## 2023-11-18 DIAGNOSIS — Z801 Family history of malignant neoplasm of trachea, bronchus and lung: Secondary | ICD-10-CM | POA: Diagnosis not present

## 2023-11-18 DIAGNOSIS — D72829 Elevated white blood cell count, unspecified: Secondary | ICD-10-CM | POA: Diagnosis not present

## 2023-11-18 DIAGNOSIS — R1013 Epigastric pain: Secondary | ICD-10-CM | POA: Diagnosis not present

## 2023-11-18 DIAGNOSIS — Z8619 Personal history of other infectious and parasitic diseases: Secondary | ICD-10-CM | POA: Diagnosis not present

## 2023-11-18 DIAGNOSIS — M4800 Spinal stenosis, site unspecified: Secondary | ICD-10-CM | POA: Diagnosis present

## 2023-11-18 DIAGNOSIS — Z90411 Acquired partial absence of pancreas: Secondary | ICD-10-CM | POA: Diagnosis not present

## 2023-11-18 DIAGNOSIS — Z880 Allergy status to penicillin: Secondary | ICD-10-CM | POA: Diagnosis not present

## 2023-11-18 DIAGNOSIS — K219 Gastro-esophageal reflux disease without esophagitis: Secondary | ICD-10-CM | POA: Diagnosis present

## 2023-11-18 DIAGNOSIS — E785 Hyperlipidemia, unspecified: Secondary | ICD-10-CM | POA: Diagnosis present

## 2023-11-18 DIAGNOSIS — N400 Enlarged prostate without lower urinary tract symptoms: Secondary | ICD-10-CM | POA: Diagnosis present

## 2023-11-18 DIAGNOSIS — R6520 Severe sepsis without septic shock: Secondary | ICD-10-CM | POA: Diagnosis present

## 2023-11-18 DIAGNOSIS — C779 Secondary and unspecified malignant neoplasm of lymph node, unspecified: Secondary | ICD-10-CM | POA: Diagnosis present

## 2023-11-18 DIAGNOSIS — R935 Abnormal findings on diagnostic imaging of other abdominal regions, including retroperitoneum: Secondary | ICD-10-CM

## 2023-11-18 DIAGNOSIS — C241 Malignant neoplasm of ampulla of Vater: Secondary | ICD-10-CM | POA: Diagnosis present

## 2023-11-18 DIAGNOSIS — I700 Atherosclerosis of aorta: Secondary | ICD-10-CM | POA: Diagnosis not present

## 2023-11-18 DIAGNOSIS — Z85828 Personal history of other malignant neoplasm of skin: Secondary | ICD-10-CM | POA: Diagnosis not present

## 2023-11-18 DIAGNOSIS — C259 Malignant neoplasm of pancreas, unspecified: Secondary | ICD-10-CM | POA: Diagnosis not present

## 2023-11-18 DIAGNOSIS — Z825 Family history of asthma and other chronic lower respiratory diseases: Secondary | ICD-10-CM | POA: Diagnosis not present

## 2023-11-18 DIAGNOSIS — R188 Other ascites: Secondary | ICD-10-CM | POA: Diagnosis not present

## 2023-11-18 DIAGNOSIS — K8689 Other specified diseases of pancreas: Secondary | ICD-10-CM | POA: Diagnosis not present

## 2023-11-18 DIAGNOSIS — Z1152 Encounter for screening for COVID-19: Secondary | ICD-10-CM | POA: Diagnosis not present

## 2023-11-18 DIAGNOSIS — K8309 Other cholangitis: Secondary | ICD-10-CM | POA: Diagnosis present

## 2023-11-18 DIAGNOSIS — K869 Disease of pancreas, unspecified: Secondary | ICD-10-CM | POA: Diagnosis not present

## 2023-11-18 DIAGNOSIS — Z79899 Other long term (current) drug therapy: Secondary | ICD-10-CM | POA: Diagnosis not present

## 2023-11-18 DIAGNOSIS — E876 Hypokalemia: Secondary | ICD-10-CM | POA: Diagnosis present

## 2023-11-18 DIAGNOSIS — Z860100 Personal history of colon polyps, unspecified: Secondary | ICD-10-CM | POA: Diagnosis not present

## 2023-11-18 LAB — CBC WITH DIFFERENTIAL/PLATELET
Abs Immature Granulocytes: 0.24 10*3/uL — ABNORMAL HIGH (ref 0.00–0.07)
Basophils Absolute: 0 10*3/uL (ref 0.0–0.1)
Basophils Relative: 0 %
Eosinophils Absolute: 0 10*3/uL (ref 0.0–0.5)
Eosinophils Relative: 0 %
HCT: 37.4 % — ABNORMAL LOW (ref 39.0–52.0)
Hemoglobin: 12.8 g/dL — ABNORMAL LOW (ref 13.0–17.0)
Immature Granulocytes: 1 %
Lymphocytes Relative: 3 %
Lymphs Abs: 0.9 10*3/uL (ref 0.7–4.0)
MCH: 32.7 pg (ref 26.0–34.0)
MCHC: 34.2 g/dL (ref 30.0–36.0)
MCV: 95.7 fL (ref 80.0–100.0)
Monocytes Absolute: 2 10*3/uL — ABNORMAL HIGH (ref 0.1–1.0)
Monocytes Relative: 7 %
Neutro Abs: 27.2 10*3/uL — ABNORMAL HIGH (ref 1.7–7.7)
Neutrophils Relative %: 89 %
Platelets: 221 10*3/uL (ref 150–400)
RBC: 3.91 MIL/uL — ABNORMAL LOW (ref 4.22–5.81)
RDW: 15.9 % — ABNORMAL HIGH (ref 11.5–15.5)
WBC: 30.4 10*3/uL — ABNORMAL HIGH (ref 4.0–10.5)
nRBC: 0 % (ref 0.0–0.2)

## 2023-11-18 LAB — COMPREHENSIVE METABOLIC PANEL
ALT: 112 U/L — ABNORMAL HIGH (ref 0–44)
AST: 127 U/L — ABNORMAL HIGH (ref 15–41)
Albumin: 3.2 g/dL — ABNORMAL LOW (ref 3.5–5.0)
Alkaline Phosphatase: 226 U/L — ABNORMAL HIGH (ref 38–126)
Anion gap: 7 (ref 5–15)
BUN: 11 mg/dL (ref 8–23)
CO2: 23 mmol/L (ref 22–32)
Calcium: 8.1 mg/dL — ABNORMAL LOW (ref 8.9–10.3)
Chloride: 107 mmol/L (ref 98–111)
Creatinine, Ser: 0.81 mg/dL (ref 0.61–1.24)
GFR, Estimated: >60 mL/min (ref >60)
Glucose, Bld: 130 mg/dL — ABNORMAL HIGH (ref 70–99)
Potassium: 4.4 mmol/L (ref 3.5–5.1)
Sodium: 137 mmol/L (ref 135–145)
Total Bilirubin: 5.5 mg/dL — ABNORMAL HIGH (ref 0.0–1.2)
Total Protein: 5.5 g/dL — ABNORMAL LOW (ref 6.5–8.1)

## 2023-11-18 LAB — LACTIC ACID, PLASMA
Lactic Acid, Venous: 3.1 mmol/L (ref 0.5–1.9)
Lactic Acid, Venous: 2.1 mmol/L (ref 0.5–1.9)
Lactic Acid, Venous: 2.1 mmol/L (ref 0.5–1.9)

## 2023-11-18 MED ORDER — PANTOPRAZOLE SODIUM 40 MG PO TBEC
40.0000 mg | DELAYED_RELEASE_TABLET | Freq: Every day | ORAL | Status: DC
  Administered 2023-11-18 – 2023-11-21 (×4): 40 mg via ORAL
  Filled 2023-11-18 (×4): qty 1

## 2023-11-18 MED ORDER — ENOXAPARIN SODIUM 40 MG/0.4ML IJ SOSY
40.0000 mg | PREFILLED_SYRINGE | INTRAMUSCULAR | Status: DC
  Administered 2023-11-18 – 2023-11-20 (×3): 40 mg via SUBCUTANEOUS
  Filled 2023-11-18 (×3): qty 0.4

## 2023-11-18 MED ORDER — GADOBUTROL 1 MMOL/ML IV SOLN
8.0000 mL | Freq: Once | INTRAVENOUS | Status: AC | PRN
  Administered 2023-11-18: 8 mL via INTRAVENOUS

## 2023-11-18 MED ORDER — POTASSIUM CHLORIDE CRYS ER 10 MEQ PO TBCR
10.0000 meq | EXTENDED_RELEASE_TABLET | Freq: Two times a day (BID) | ORAL | Status: DC
  Administered 2023-11-18 – 2023-11-21 (×6): 10 meq via ORAL
  Filled 2023-11-18 (×6): qty 1

## 2023-11-18 MED ORDER — TAMSULOSIN HCL 0.4 MG PO CAPS
0.4000 mg | ORAL_CAPSULE | Freq: Every day | ORAL | Status: DC
  Administered 2023-11-18 – 2023-11-21 (×4): 0.4 mg via ORAL
  Filled 2023-11-18 (×4): qty 1

## 2023-11-18 MED ORDER — TRAZODONE HCL 50 MG PO TABS
50.0000 mg | ORAL_TABLET | Freq: Every evening | ORAL | Status: DC | PRN
  Administered 2023-11-19 – 2023-11-20 (×2): 50 mg via ORAL
  Filled 2023-11-18 (×2): qty 1

## 2023-11-18 MED ORDER — HYDROMORPHONE HCL 1 MG/ML IJ SOLN: 0.5000 mg | INTRAMUSCULAR | Status: DC | PRN

## 2023-11-18 MED ORDER — ONDANSETRON HCL 4 MG/2ML IJ SOLN: 4.0000 mg | Freq: Four times a day (QID) | INTRAMUSCULAR | Status: DC | PRN

## 2023-11-18 MED ORDER — SODIUM CHLORIDE 0.9% FLUSH
10.0000 mL | Freq: Two times a day (BID) | INTRAVENOUS | Status: DC
  Administered 2023-11-19 – 2023-11-21 (×6): 10 mL

## 2023-11-18 MED ORDER — IBUPROFEN 200 MG PO TABS
400.0000 mg | ORAL_TABLET | Freq: Four times a day (QID) | ORAL | Status: DC | PRN
  Administered 2023-11-19: 400 mg via ORAL
  Filled 2023-11-18: qty 2

## 2023-11-18 MED ORDER — PANCRELIPASE (LIP-PROT-AMYL) 12000-38000 UNITS PO CPEP
12000.0000 [IU] | ORAL_CAPSULE | Freq: Three times a day (TID) | ORAL | Status: DC
  Administered 2023-11-19 – 2023-11-21 (×6): 12000 [IU] via ORAL
  Filled 2023-11-18 (×7): qty 1

## 2023-11-18 MED ORDER — ONDANSETRON HCL 4 MG PO TABS: 4.0000 mg | ORAL_TABLET | Freq: Four times a day (QID) | ORAL | Status: DC | PRN

## 2023-11-18 MED ORDER — ALBUTEROL SULFATE (2.5 MG/3ML) 0.083% IN NEBU: 2.5000 mg | INHALATION_SOLUTION | RESPIRATORY_TRACT | Status: DC | PRN

## 2023-11-18 MED ORDER — SODIUM CHLORIDE 0.9% FLUSH: 10.0000 mL | INTRAVENOUS | Status: DC | PRN

## 2023-11-18 MED ORDER — CHLORHEXIDINE GLUCONATE CLOTH 2 % EX PADS
6.0000 | MEDICATED_PAD | Freq: Every day | CUTANEOUS | Status: DC
  Administered 2023-11-19 – 2023-11-21 (×3): 6 via TOPICAL

## 2023-11-18 MED ORDER — OXYCODONE HCL 5 MG PO TABS: 5.0000 mg | ORAL_TABLET | ORAL | Status: DC | PRN

## 2023-11-18 NOTE — Progress Notes (Signed)
 [facility]   DOB:March 21, 1941   NW#:[id number]      ASSESSMENT & PLAN:  Ampullary CA, moderately differentiated adenocarcinoma, stage IIIa - Initially diagnosed August 2024 - pathology from the pancreaticoduodenectomy (Whipple) on 05/17/2023 revealed an intra ampullary moderately differentiated adenocarcinoma. Tumor > 0.5 cm into the pancreas and extended into peripancreatic soft tissue and duodenal tissue. Lymphovascular and perineural invasion present. Resection margins negative. 3 of 13 lymph nodes contained metastatic carcinoma.  - Initiated cycle 1 chemotherapy with gemcitabine/capecitabine on 06/23/2023, with day 15 gem only. - Patient received cycle 5-day 1 on 11/01/2023 and cycle 5-day 15 scheduled on 11/15/2023 was held due to elevated bilirubin - MRI/MRCP done 10/22/2023 showed no evidence of metastatic disease. -Medical oncology/Dr. Truett Perna following and will make further treatment and management recommendations.  2. Abdominal Pain with Nausea - Patient complains of abdominal pain that started 11/17/2023 necessitating ED evaluation. - Likely due to malignancy  3.  Cholangitis Hyperbilirubinemia Transaminitis - Likely due to malignancy - Total bili elevated 5.5 today - Steadily rising liver enzymes in the last 3 days - Continue IV antibiotics - Consider GI eval  4.  Leukocytosis - Elevated WBCs 30.4 today - May be due to infectious process/cholangitis - Continue IV antibiotics  5.  Anemia, normocytic - Mild - Hemoglobin 12.8 today - Monitor CBC with differential  6.  H. pylori positive - Diagnosed August 2024 - Will confirm treatment plan with Dr. Leone Payor  Code Status Full  Subjective:  Patient seen awake and alert laying supine in bed.  He is ill-appearing and jaundiced.  Patient reports that he feels fine "just tired" and has not had any pain or nausea since last night.  Wife at bedside.  No acute distress is noted and no acute complaints offered.  Objective:   Vitals:   11/18/23 1200 11/18/23 1339  BP: 101/61 117/66  Pulse: 78 82  Resp: 19   Temp:  98.3 F (36.8 C)  SpO2: 95% 98%     Intake/Output Summary (Last 24 hours) at 11/18/2023 1526 Last data filed at 11/18/2023 0236 Gross per 24 hour  Intake 2151.8 ml  Output --  Net 2151.8 ml     REVIEW OF SYSTEMS:   Constitutional: + Fatigue, denies fevers, chills or abnormal night sweats Eyes: Denies blurriness of vision, double vision or watery eyes Ears, nose, mouth, throat, and face: Denies mucositis or sore throat Respiratory: Denies cough, dyspnea or wheezes Cardiovascular: Denies palpitation, chest discomfort or lower extremity swelling Gastrointestinal:  Denies nausea, heartburn or change in bowel habits Skin: Denies abnormal skin rashes Lymphatics: Denies new lymphadenopathy or easy bruising Neurological: Denies numbness, tingling or new weaknesses Behavioral/Psych: Mood is stable, no new changes  All other systems were reviewed with the patient and are negative.  PHYSICAL EXAMINATION: ECOG PERFORMANCE STATUS: 2 - Symptomatic, <50% confined to bed  Vitals:   11/18/23 1200 11/18/23 1339  BP: 101/61 117/66  Pulse: 78 82  Resp: 19   Temp:  98.3 F (36.8 C)  SpO2: 95% 98%   Filed Weights   11/17/23 1708  Weight: 169 lb 12.8 oz (77 kg)    GENERAL: alert, +ill-appearing SKIN: + Jaundiced skin color, texture, turgor are normal, no rashes or significant lesions EYES: normal, conjunctiva are pink and non-injected, sclera clear OROPHARYNX: no exudate, no erythema and lips, buccal mucosa, and tongue normal  NECK: supple, thyroid normal size, non-tender, without nodularity LYMPH: no palpable lymphadenopathy in the cervical, axillary or inguinal LUNGS: clear to auscultation and percussion  with normal breathing effort HEART: regular rate & rhythm and no murmurs and no lower extremity edema ABDOMEN: abdomen soft, non-tender and normal bowel sounds MUSCULOSKELETAL: no cyanosis  of digits and no clubbing  PSYCH: alert & oriented x 3 with fluent speech NEURO: no focal motor/sensory deficits   All questions were answered. The patient knows to call the clinic with any problems, questions or concerns.   The total time spent in the appointment was 40 minutes encounter with patient including review of chart and various tests results, discussions about plan of care and coordination of care plan  Dawson Bills, NP 11/18/2023 3:26 PM    Labs Reviewed:  Lab Results  Component Value Date   WBC 30.4 (H) 11/18/2023   HGB 12.8 (L) 11/18/2023   HCT 37.4 (L) 11/18/2023   MCV 95.7 11/18/2023   PLT 221 11/18/2023   Recent Labs    11/15/23 1132 11/17/23 1710 11/18/23 1007  NA 135 136 137  K 4.4 3.8 4.4  CL 101 101 107  CO2 26 26 23   GLUCOSE 127* 160* 130*  BUN 12 9 11   CREATININE 0.80 0.92 0.81  CALCIUM 9.1 9.1 8.1*  GFRNONAA >60 >60 >60  PROT 7.1 6.6 5.5*  ALBUMIN 3.9 3.8 3.2*  AST 47* 141* 127*  ALT 56* 94* 112*  ALKPHOS 235* 279* 226*  BILITOT 1.9* 3.3* 5.5*    Studies Reviewed:  DG Chest Portable 1 View Result Date: 11/17/2023 CLINICAL DATA:  Shortness of breath. Abdominal pain and nausea for 3 hours. Currently on chemotherapy for pancreatic cancer. EXAM: PORTABLE CHEST 1 VIEW COMPARISON:  10/09/2021 FINDINGS: Power port type central venous catheter with tip over the cavoatrial junction region. No pneumothorax. Heart size and pulmonary vascularity are normal. Shallow inspiration. There is infiltration or atelectasis in the left lung base. Right lung is clear. No pleural effusions. Mediastinal contours appear intact. IMPRESSION: Shallow inspiration with infiltration or atelectasis in the left lung base. Electronically Signed   By: Burman Nieves M.D.   On: 11/17/2023 23:58   CT ABDOMEN PELVIS W CONTRAST Result Date: 11/17/2023 CLINICAL DATA:  Epigastric pain. History of pancreatic cancer with Whipple. EXAM: CT ABDOMEN AND PELVIS WITH CONTRAST TECHNIQUE:  Multidetector CT imaging of the abdomen and pelvis was performed using the standard protocol following bolus administration of intravenous contrast. RADIATION DOSE REDUCTION: This exam was performed according to the departmental dose-optimization program which includes automated exposure control, adjustment of the mA and/or kV according to patient size and/or use of iterative reconstruction technique. CONTRAST:  85mL OMNIPAQUE IOHEXOL 300 MG/ML  SOLN COMPARISON:  10/09/2023 FINDINGS: Lower chest: Dependent ground-glass opacities in the lower lobes, likely dependent atelectasis. No effusions. Hepatobiliary: Choledocho jejunostomy anatomy noted post Whipple procedure. Mild intrahepatic biliary ductal dilatation, increasing since the prior study. Common hepatic duct measures 9 mm in diameter compared to 7 mm previously. Soft tissue in the porta hepatis is stable since prior study. There is slight stranding in the retroperitoneum adjacent to this soft tissue which is also stable and felt to reflect postoperative change. No focal hepatic abnormality. Pancreas: Status post Whipple and partial pancreatectomy. No pancreatic ductal dilatation or focal abnormality. Spleen: No focal abnormality or ductal dilatation. Adrenals/Urinary Tract: No focal abnormality.  Normal size. Stomach/Bowel: Prior partial gastrostomy and gastrojejunostomy. No visible complicating feature. No bowel obstruction or inflammatory process. Sigmoid diverticulosis. No active diverticulitis. Vascular/Lymphatic: No evidence of aneurysm or adenopathy. Aortic atherosclerosis. Reproductive: Mildly prominent prostate Other: No free fluid or free air. Musculoskeletal:  No acute bony abnormality. IMPRESSION: Increasing intrahepatic biliary ductal dilatation and prominence of the common hepatic duct which terminates within prominent soft tissue in the porta hepatis. This is similar/stable when compared to prior CT and MRI. Given the increasing biliary ductal  dilatation, repeat MRI may be helpful to completely exclude residual recurrent mass. Aortic atherosclerosis. Sigmoid diverticulosis. Electronically Signed   By: Charlett Nose M.D.   On: 11/17/2023 21:08   MR ABDOMEN MRCP W WO CONTAST Result Date: 10/22/2023 CLINICAL DATA:  History ampullary cancer status post Whipple procedure. Undergoing chemotherapy. Recent increase in LFTs. EXAM: MRI ABDOMEN WITHOUT AND WITH CONTRAST (INCLUDING MRCP) TECHNIQUE: Multiplanar multisequence MR imaging of the abdomen was performed both before and after the administration of intravenous contrast. Heavily T2-weighted images of the biliary and pancreatic ducts were obtained, and three-dimensional MRCP images were rendered by post processing. CONTRAST:  8 cc Vueway COMPARISON:  CT scan 10/09/2023 FINDINGS: Lower chest: The lung bases are clear of an acute process. No infiltrates or effusions. No pulmonary lesions. No pericardial effusion. Hepatobiliary: No hepatic lesions are identified to suggest metastatic disease. There is mild intrahepatic biliary dilatation. The common bile duct is normal in caliber but the wall is uniformly thickened and demonstrates contrast enhancement which could suggest inflammation/cholangitis. Do not see any definite enhancement involving the walls of the intrahepatic ducts. Pancreas: Status post Whipple procedure with expected postoperative changes. No pancreatic mass or acute inflammation. The main pancreatic duct is normal in caliber. Moderate atrophy of the pancreas. Spleen:  Normal size.  No splenic lesions. Adrenals/Urinary Tract: The adrenal glands are normal. No renal lesions or hydronephrosis. Stomach/Bowel: The stomach stable surgical changes from the Whipple procedure. I do not see any complicating features involving the. No inflammatory changes or obstructive findings. Vascular/Lymphatic: The aorta and branch vessels are patent. No mesenteric or retroperitoneal lymphadenopathy. Other:  No ascites  or peritoneal implants. Musculoskeletal: No significant bony findings. IMPRESSION: 1. Status post Whipple procedure with expected postoperative changes. No complicating features. 2. No findings for metastatic disease involving the abdomen. 3. Mild intrahepatic biliary dilatation. The common bile duct is normal in caliber but the wall is uniformly thickened and demonstrates contrast enhancement which could suggest inflammation/cholangitis. Electronically Signed   By: Rudie Meyer M.D.   On: 10/22/2023 10:23

## 2023-11-18 NOTE — Telephone Encounter (Signed)
 Retrieved VM that was left at 0620 on 2/26--patient has had "stomach pain for 2 hours". If they do not hear from office, she will take him to emergency room. This am noted that he is in ER with cholangitis and on IV antibiotics w/admission planned. Dr. Truett Perna notified.

## 2023-11-18 NOTE — H&P (Signed)
 History and Physical  [name]:[id number] DOB: 17-Oct-1940 DOA: 11/17/2023  PCP: Corwin Levins, MD   Chief Complaint: Abdominal pain, nausea  HPI: Charles Lawson is a 83 y.o. 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  He is currently under the care of Dr. Truett Perna, receiving gemcitabine/capecitabine and Xeloda.  She had previous LFT elevation, followed by Dr. Leone Payor as an outpatient and his LFT elevation had resolved.  On 11/01/2023 he started cycle 5 of his chemotherapy, he was noted to have recurrent elevation of liver enzymes and of liver enzymes, but the patient was clinically stable.  Due to liver enzyme elevation, gemcitabine was held on 2/24 but he tolerated well.  Starting yesterday, the patient had sudden onset of epigastric abdominal pain, nausea.  Denies any fevers, or vomiting.  He came to the ER for evaluation yesterday afternoon, was found to have significant elevation in his LFTs as well as leukocytosis.  Started on empiric antibiotics for cholangitis, admitted to the hospitalist service.  Review of Systems: Please see HPI for pertinent positives and negatives. A complete 10 system review of systems are otherwise negative.  Past Medical History:  Diagnosis Date   Allergic rhinitis, cause unspecified    Arthritis    Blood pressure elevated without history of HTN    BPH (benign prostatic hyperplasia)    Diverticulosis of colon    ED (erectile dysfunction)    Helicobacter pylori gastritis 04/2023   Tx quad tx and eradication documented neg stool ag 07/2023   History of basal cell carcinoma (BCC) excision    Hyperlipidemia    Impaired glucose tolerance 04/03/2013   Personal history of colonic polyps 06/07/2012   Past Surgical History:  Procedure Laterality Date   Quintella Reichert OSTEOTOMY Left  07/19/2020   Procedure: Quintella Reichert OSTEOTOMY;  Surgeon: Edwin Cap, DPM;  Location: [facility] [place];  Service: Podiatry;  Laterality: Left;   BIOPSY  05/10/2023   Procedure: BIOPSY;  Surgeon: Iva Boop, MD;  Location: [facility];  Service: Gastroenterology;;   BIOPSY  05/12/2023   Procedure: BIOPSY;  Surgeon: Iva Boop, MD;  Location: [facility];  Service: Gastroenterology;;   Arbutus Leas Left 07/19/2020   Procedure: Delos Haring;  Surgeon: Edwin Cap, DPM;  Location: [facility];  Service: Podiatry;  Laterality: Left;   COLONOSCOPY  last one 10-06-2017  dr Leone Payor   ESOPHAGOGASTRODUODENOSCOPY N/A 05/12/2023   Procedure: ESOPHAGOGASTRODUODENOSCOPY (EGD);  Surgeon: Iva Boop, MD;  Location: [facility];  Service: Gastroenterology;  Laterality: N/A;   ESOPHAGOGASTRODUODENOSCOPY (EGD) WITH PROPOFOL N/A 05/10/2023   Procedure: ESOPHAGOGASTRODUODENOSCOPY (EGD) WITH PROPOFOL;  Surgeon: Iva Boop, MD;  Location: [facility];  Service: Gastroenterology;  Laterality: N/A;   HAMMER TOE SURGERY Left 07/19/2020   Procedure: HAMMER TOE CORRECTION;  Surgeon: Edwin Cap, DPM;  Location: [facility];  Service: Podiatry;  Laterality: Left;   INGUINAL HERNIA REPAIR Right 11-21-2008  @ [facility]   IR IMAGING GUIDED PORT INSERTION  06/18/2023   LUMBAR LAMINECTOMY  1974   METATARSAL OSTEOTOMY Left 07/19/2020   Procedure: METATARSAL OSTEOTOMY;  Surgeon: Edwin Cap, DPM;  Location: [facility];  Service: Podiatry;  Laterality: Left;   ORIF TOE FRACTURE Left 07/19/2020   Procedure: OPEN TREATMENT OF METARSAL PHALANGEAL JOINT DISLOCATION;  Surgeon: Edwin Cap, DPM;  Location:  [facility];  Service: Podiatry;  Laterality: Left;  ANKLE BLOCK WITH THIGH TOURNIQUET   Social History:  reports that he has never smoked. He has never used smokeless tobacco. He reports current alcohol use. He reports  that he does not use drugs.  Allergies  Allergen Reactions   Penicillins Other (See Comments)    Unknown childhood reaction    Family History  Problem Relation Age of Onset   COPD Mother    Cancer Father    Lung cancer Father    Colon cancer Neg Hx    Colon polyps Neg Hx    Rectal cancer Neg Hx    Stomach cancer Neg Hx      Prior to Admission medications   Medication Sig Start Date End Date Taking? Authorizing Provider  acetaminophen (TYLENOL) 650 MG CR tablet Take 500 mg by mouth every 8 (eight) hours as needed for pain. 05/27/23: reports during [facility] call taking after recent surgery at [facility]   Yes Corwin Levins, MD  capecitabine (XELODA) 500 MG tablet Take #1 tablet (500 mg) in am and #2 tablets (1000 mg) in pm. Take for 21 days, then hold for 7 days. Repeat every 28 days. Start cycle on 11/29/2023 11/15/23  Yes Rana Snare, NP  Cyanocobalamin (VITAMIN B-12 PO) Take 1 tablet by mouth at bedtime.   Yes [provider]  lidocaine-prilocaine (EMLA) cream Apply 1 Application topically as needed. 11/15/23  Yes Rana Snare, NP  lipase/protease/amylase (CREON) 12000-38000 units CPEP capsule Take 1 capsule (12,000 Units total) by mouth 3 (three) times daily before meals. 07/22/23  Yes Rana Snare, NP  omeprazole (PRILOSEC) 20 MG capsule Take 1 capsule (20 mg total) by mouth daily. 08/16/23  Yes Corwin Levins, MD  ondansetron (ZOFRAN) 8 MG tablet Take 1 tablet (8 mg total) by mouth every 8 (eight) hours as needed for nausea. 11/04/23  Yes Ladene Artist, MD  potassium chloride (KLOR-CON M) 10 MEQ tablet Take 1 tablet (10 mEq total) by mouth 2 (two) times daily. 08/23/23  Yes Ladene Artist, MD  prochlorperazine (COMPAZINE) 10 MG tablet Take 1 tablet (10 mg total) by mouth every 6 (six) hours as needed for nausea or vomiting. 11/04/23  Yes Ladene Artist, MD  tamsulosin (FLOMAX) 0.4 MG CAPS capsule TAKE 1 CAPSULE (0.4 MG TOTAL) BY MOUTH DAILY. 05/27/23: REPORTS DURING TOC CALL  TAKING AFTER RECENT SURGERY AT [facility] 11/10/23  Yes Corwin Levins, MD    Physical Exam: BP 117/66 (BP Location: Right Arm)   Pulse 82   Temp 98.3 F (36.8 C) (Oral)   Resp 19   Ht 5\' 11"  (1.803 m)   Wt 77 kg   SpO2 98%   BMI 23.68 kg/m  General:  Alert, oriented, calm, in no acute distress  Eyes: EOMI, clear conjuctivae, white sclerea Neck: supple, no masses, trachea mildline  Cardiovascular: RRR, no murmurs or rubs, no peripheral edema  Respiratory: clear to auscultation bilaterally, no wheezes, no crackles  Abdomen: soft, tender epigastrium and right upper quadrant, nondistended, normal bowel tones heard  Skin: dry, no rashes  Musculoskeletal: no joint effusions, normal range of motion  Psychiatric: appropriate affect, normal speech  Neurologic: extraocular muscles intact, clear speech, moving all extremities with intact sensorium         Labs on Admission:  Basic Metabolic Panel: Recent Labs  Lab 11/15/23 1132 11/17/23 1710 11/18/23 1007  NA 135 136 137  K 4.4 3.8 4.4  CL 101 101 107  CO2 26 26 23   GLUCOSE 127* 160* 130*  BUN 12 9 11   CREATININE 0.80 0.92 0.81  CALCIUM 9.1 9.1 8.1*   Liver Function Tests: Recent Labs  Lab 11/15/23 1132 11/17/23 1710 11/18/23 1007  AST 47* 141* 127*  ALT 56* 94* 112*  ALKPHOS 235* 279* 226*  BILITOT 1.9* 3.3* 5.5*  PROT 7.1 6.6 5.5*  ALBUMIN 3.9 3.8 3.2*   Recent Labs  Lab 11/17/23 1710  LIPASE <10*   No results for input(s): "AMMONIA" in the last 168 hours. CBC: Recent Labs  Lab 11/15/23 1132 11/17/23 1710 11/18/23 1007  WBC 6.8 20.4* 30.4*  NEUTROABS 3.8  --  27.2*  HGB 14.2 13.8 12.8*  HCT 42.1 41.2 37.4*  MCV 95.5 96.9 95.7  PLT 233 303 221   Cardiac Enzymes: No results for input(s): "CKTOTAL", "CKMB", "CKMBINDEX", "TROPONINI" in the last 168 hours. BNP (last 3 results) No results for input(s): "BNP" in the last 8760 hours.  ProBNP (last 3 results) No results for input(s): "PROBNP" in the last 8760  hours.  CBG: No results for input(s): "GLUCAP" in the last 168 hours.  Radiological Exams on Admission: DG Chest Portable 1 View Result Date: 11/17/2023 CLINICAL DATA:  Shortness of breath. Abdominal pain and nausea for 3 hours. Currently on chemotherapy for pancreatic cancer. EXAM: PORTABLE CHEST 1 VIEW COMPARISON:  10/09/2021 FINDINGS: Power port type central venous catheter with tip over the cavoatrial junction region. No pneumothorax. Heart size and pulmonary vascularity are normal. Shallow inspiration. There is infiltration or atelectasis in the left lung base. Right lung is clear. No pleural effusions. Mediastinal contours appear intact. IMPRESSION: Shallow inspiration with infiltration or atelectasis in the left lung base. Electronically Signed   By: Burman Nieves M.D.   On: 11/17/2023 23:58   CT ABDOMEN PELVIS W CONTRAST Result Date: 11/17/2023 CLINICAL DATA:  Epigastric pain. History of pancreatic cancer with Whipple. EXAM: CT ABDOMEN AND PELVIS WITH CONTRAST TECHNIQUE: Multidetector CT imaging of the abdomen and pelvis was performed using the standard protocol following bolus administration of intravenous contrast. RADIATION DOSE REDUCTION: This exam was performed according to the departmental dose-optimization program which includes automated exposure control, adjustment of the mA and/or kV according to patient size and/or use of iterative reconstruction technique. CONTRAST:  85mL OMNIPAQUE IOHEXOL 300 MG/ML  SOLN COMPARISON:  10/09/2023 FINDINGS: Lower chest: Dependent ground-glass opacities in the lower lobes, likely dependent atelectasis. No effusions. Hepatobiliary: [place] jejunostomy anatomy noted post Whipple procedure. Mild intrahepatic biliary ductal dilatation, increasing since the prior study. Common hepatic duct measures 9 mm in diameter compared to 7 mm previously. Soft tissue in the porta hepatis is stable since prior study. There is slight stranding in the retroperitoneum  adjacent to this soft tissue which is also stable and felt to reflect postoperative change. No focal hepatic abnormality. Pancreas: Status post Whipple and partial pancreatectomy. No pancreatic ductal dilatation or focal abnormality. Spleen: No focal abnormality or ductal dilatation. Adrenals/Urinary Tract: No focal abnormality.  Normal size. Stomach/Bowel: Prior partial gastrostomy and gastrojejunostomy. No visible complicating feature. No bowel obstruction or inflammatory process. Sigmoid diverticulosis. No active diverticulitis. Vascular/Lymphatic: No evidence of aneurysm or adenopathy. Aortic atherosclerosis. Reproductive: Mildly prominent prostate Other: No free fluid or free air. Musculoskeletal: No acute bony abnormality. IMPRESSION: Increasing intrahepatic biliary ductal dilatation and prominence of the common hepatic duct which terminates within prominent soft tissue in the porta hepatis. This is similar/stable when compared to prior CT and MRI. Given the  increasing biliary ductal dilatation, repeat MRI may be helpful to completely exclude residual recurrent mass. Aortic atherosclerosis. Sigmoid diverticulosis. Electronically Signed   By: Charlett Nose M.D.   On: 11/17/2023 21:08   Assessment/Plan SEIF TEICHERT is a 83 y.o. 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  Severe sepsis-meeting criteria with tachycardia, leukocytosis, initial lactate 2.9.  Suspected source is acute cholangitis, treating as below. -Inpatient admission -Continue empiric treatment as below -Follow-up blood cultures  Acute cholangitis-suspected due to abdominal pain, increased LFTs and obstructive pattern, and significant leukocytosis.  Total bilirubin up to 5.5. -Continue empiric IV ciprofloxacin and IV Flagyl -Discussed with Dr. Marina Goodell of  [facility], they will consult -Obtain MRCP to evaluate for etiology of ductal dilatation, and for hepatic masses  Ampullary carcinoma-currently under the care of Dr. Truett Perna -Discussed with Dr. Truett Perna, who will consult during this hospital stay -Hold Xeloda and chemo therapy for the time being  Leukocytosis-due to suspected cholangitis, treating as above  GERD-Protonix p.o.  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 Inpatient status is judged to be reasonable and necessary in order to provide the required intensity of service to ensure the patient's safety. The patient's presenting symptoms, physical exam findings, and initial radiographic and laboratory data in the context of their chronic comorbidities is felt to place them at high risk for further clinical deterioration. Furthermore, it is not anticipated that the patient will be medically stable for discharge from the hospital within 2 midnights of admission.    I certify that at the point of admission it is my clinical judgment that the patient will require inpatient hospital care spanning beyond 2 midnights from the point of admission due to high intensity of service, high risk for further deterioration and high frequency of surveillance required  Time spent: 48 minutes  Devyne Hauger Sharlette Dense MD [facility] Pager [phone]  If 7PM-7AM, please contact night-coverage [email or web address] Password [facility]  11/18/2023, 2:24 PM

## 2023-11-18 NOTE — ED Notes (Signed)
Report given to oncoming RN Heather.

## 2023-11-18 NOTE — ED Notes (Signed)
Monisha with [facility] called for transport

## 2023-11-18 NOTE — Consult Note (Addendum)
 Consultation Note   Referring Provider:  [facility] PCP: Corwin Levins, MD Primary Gastroenterologist:    Stan Head, MD  Reason for Consultation:  Biliary obstruction / cholangitis DOA: 11/17/2023         Hospital Day: 2   ASSESSMENT    Brief Narrative:  83 y.o. 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 Admitted with new biliary obstruction / cholangitis.   History of ampullary cancer s/p Whipple procedure Aug 2024 at [facility] Undergoing chemotherapy. No evidence for metastatic disease on MRI / MRCP 10/22/23.   New biliary obstruction / cholangitis. CT scan demonstrating intrahepatic biliary ductal dilatation and prominence of the common hepatic duct which terminates within prominent soft tissue in the porta hepatis. WBC 30K    PLAN:   --On Cipro and Flagyl --MRI / MRCP already ordered. Once resulted, Dr. Marina Goodell and Dr. Meridee Score, our advanced biliary endoscopist,  will review images to see if ERCP is feasible given his anatomy post Whipple procedure. --Will go ahead and consult IR as it is quite possible that percutaneous biliary drain will be needed  HPI   Mr Class has been undergoing chemotherapy. Recently developed increase in bilirubin. MRCP 1/31 showed mild intrahepatic biliary dilatation. The CBD was normal in caliber but the wall wall was uniformly thickened and demonstrated contrast enhancement suggesting possible inflammation/cholangitis. Yesterday patient developed non-radiating upper abdominal pain and chills. He presented to [facility] ED where Tbili was up to 1.3,  and WBC was 20K.Marland Kitchen CT scan showed worsening biliary duct dilation.   CT AP with contrast IMPRESSION: Increasing intrahepatic biliary ductal dilatation and prominence of the common hepatic duct which terminates within prominent soft tissue in the porta hepatis. This is  similar/stable when compared to prior CT and MRI. Given the increasing biliary ductal dilatation, repeat MRI may be helpful to completely exclude residual / recurrent mass.   Recent Labs    11/17/23 1710 11/18/23 1007  WBC 20.4* 30.4*  HGB 13.8 12.8*  HCT 41.2 37.4*  PLT 303 221   Recent Labs    11/17/23 1710 11/18/23 1007  NA 136 137  K 3.8 4.4  CL 101 107  CO2 26 23  GLUCOSE 160* 130*  BUN 9 11  CREATININE 0.92 0.81  CALCIUM 9.1 8.1*   Recent Labs    11/18/23 1007  PROT 5.5*  ALBUMIN 3.2*  AST 127*  ALT 112*  ALKPHOS 226*  BILITOT 5.5*   Lactic acid 2.1   Past Medical History:  Diagnosis Date   Allergic rhinitis, cause unspecified    Arthritis    Blood pressure elevated without history of HTN    BPH (benign prostatic hyperplasia)    Diverticulosis of colon    ED (erectile dysfunction)    Helicobacter pylori gastritis 04/2023   Tx quad tx and eradication documented neg stool ag 07/2023   History of basal cell carcinoma (BCC) excision    Hyperlipidemia    Impaired glucose tolerance 04/03/2013   Personal history of colonic polyps 06/07/2012    Past Surgical History:  Procedure Laterality Date   Quintella Reichert OSTEOTOMY Left 07/19/2020   Procedure: Quintella Reichert OSTEOTOMY;  Surgeon: Edwin Cap, DPM;  Location: [facility];  Service: Podiatry;  Laterality: Left;   BIOPSY  05/10/2023   Procedure: BIOPSY;  Surgeon: Iva Boop, MD;  Location: [facility];  Service: Gastroenterology;;   BIOPSY  05/12/2023   Procedure: BIOPSY;  Surgeon: Iva Boop, MD;  Location: [facility];  Service: Gastroenterology;;   Arbutus Leas Left 07/19/2020   Procedure: Delos Haring;  Surgeon: Edwin Cap, DPM;  Location: [facility];  Service: Podiatry;  Laterality: Left;   COLONOSCOPY  last one 10-06-2017  dr Leone Payor   ESOPHAGOGASTRODUODENOSCOPY N/A 05/12/2023   Procedure: ESOPHAGOGASTRODUODENOSCOPY (EGD);  Surgeon: Iva Boop, MD;   Location: [facility];  Service: Gastroenterology;  Laterality: N/A;   ESOPHAGOGASTRODUODENOSCOPY (EGD) WITH PROPOFOL N/A 05/10/2023   Procedure: ESOPHAGOGASTRODUODENOSCOPY (EGD) WITH PROPOFOL;  Surgeon: Iva Boop, MD;  Location: [facility];  Service: Gastroenterology;  Laterality: N/A;   HAMMER TOE SURGERY Left 07/19/2020   Procedure: HAMMER TOE CORRECTION;  Surgeon: Edwin Cap, DPM;  Location: [facility];  Service: Podiatry;  Laterality: Left;   INGUINAL HERNIA REPAIR Right 11-21-2008  @ [facility]   IR IMAGING GUIDED PORT INSERTION  06/18/2023   LUMBAR LAMINECTOMY  1974   METATARSAL OSTEOTOMY Left 07/19/2020   Procedure: METATARSAL OSTEOTOMY;  Surgeon: Edwin Cap, DPM;  Location: [facility];  Service: Podiatry;  Laterality: Left;   ORIF TOE FRACTURE Left 07/19/2020   Procedure: OPEN TREATMENT OF METARSAL PHALANGEAL JOINT DISLOCATION;  Surgeon: Edwin Cap, DPM;  Location: [facility];  Service: Podiatry;  Laterality: Left;  ANKLE BLOCK WITH THIGH TOURNIQUET    Family History  Problem Relation Age of Onset   COPD Mother    Cancer Father    Lung cancer Father    Colon cancer Neg Hx    Colon polyps Neg Hx    Rectal cancer Neg Hx    Stomach cancer Neg Hx     Prior to Admission medications   Medication Sig Start Date End Date Taking? Authorizing Provider  acetaminophen (TYLENOL) 650 MG CR tablet Take 500 mg by mouth every 8 (eight) hours as needed for pain. 05/27/23: reports during [facility] call taking after recent surgery at [facility]   Yes Corwin Levins, MD  capecitabine (XELODA) 500 MG tablet Take #1 tablet (500 mg) in am and #2 tablets (1000 mg) in pm. Take for 21 days, then hold for 7 days. Repeat every 28 days. Start cycle on 11/29/2023 11/15/23  Yes Rana Snare, NP  Cyanocobalamin (VITAMIN B-12 PO) Take 1 tablet by mouth at bedtime.   Yes [provider]  lidocaine-prilocaine (EMLA) cream Apply 1 Application topically  as needed. 11/15/23  Yes Rana Snare, NP  lipase/protease/amylase (CREON) 12000-38000 units CPEP capsule Take 1 capsule (12,000 Units total) by mouth 3 (three) times daily before meals. 07/22/23  Yes Rana Snare, NP  omeprazole (PRILOSEC) 20 MG capsule Take 1 capsule (20 mg total) by mouth daily. 08/16/23  Yes Corwin Levins, MD  ondansetron (ZOFRAN) 8 MG tablet Take 1 tablet (8 mg total) by mouth every 8 (eight) hours as needed for nausea. 11/04/23  Yes Ladene Artist, MD  potassium chloride (KLOR-CON M) 10 MEQ tablet Take 1 tablet (10 mEq total) by mouth 2 (two) times daily. 08/23/23  Yes Ladene Artist, MD  prochlorperazine (COMPAZINE) 10 MG tablet Take 1 tablet (10 mg total) by mouth every 6 (six) hours as needed for nausea or vomiting. 11/04/23  Yes Ladene Artist, MD  tamsulosin (FLOMAX) 0.4 MG CAPS capsule TAKE 1 CAPSULE (0.4 MG TOTAL) BY MOUTH DAILY. 05/27/23: REPORTS DURING TOC CALL TAKING AFTER RECENT SURGERY AT [facility] 11/10/23  Yes Corwin Levins, MD    Current Facility-Administered Medications  Medication Dose Route Frequency Provider Last Rate Last Admin   albuterol (PROVENTIL) (2.5 MG/3ML) 0.083% nebulizer solution 2.5 mg  2.5 mg Nebulization Q2H PRN Kirby Crigler, Mir M, MD       ciprofloxacin (CIPRO) IVPB 400 mg  400 mg Intravenous Q12H Edwin Dada P, DO 200 mL/hr at 11/18/23 1006 400 mg at 11/18/23 1006   enoxaparin (LOVENOX) injection 40 mg  40 mg Subcutaneous Q24H Kirby Crigler, Mir M, MD       HYDROmorphone (DILAUDID) injection 0.5-1 mg  0.5-1 mg Intravenous Q2H PRN Kirby Crigler, Mir M, MD       ibuprofen (ADVIL) tablet 400 mg  400 mg Oral Q6H PRN Kirby Crigler, Mir M, MD       lipase/protease/amylase (CREON) capsule 12,000 Units  12,000 Units Oral TID AC Kirby Crigler, Mir M, MD       metroNIDAZOLE (FLAGYL) IVPB 500 mg  500 mg Intravenous Q12H Edwin Dada P, DO   Stopped at 11/18/23 0702   ondansetron (ZOFRAN) tablet 4 mg  4 mg Oral Q6H PRN Kirby Crigler, Mir M, MD       Or   ondansetron  [facility]) injection 4 mg  4 mg Intravenous Q6H PRN Kirby Crigler, Mir M, MD       oxyCODONE (Oxy IR/ROXICODONE) immediate release tablet 5 mg  5 mg Oral Q4H PRN Kirby Crigler, Mir M, MD       pantoprazole (PROTONIX) EC tablet 40 mg  40 mg Oral Daily Kirby Crigler, Mir M, MD       potassium chloride (KLOR-CON M) CR tablet 10 mEq  10 mEq Oral BID Kirby Crigler, Mir M, MD       tamsulosin (FLOMAX) capsule 0.4 mg  0.4 mg Oral Daily Kirby Crigler, Mir M, MD       traZODone (DESYREL) tablet 50 mg  50 mg Oral QHS PRN Kirby Crigler, Mir M, MD       Facility-Administered Medications Ordered in Other Encounters  Medication Dose Route Frequency Provider Last Rate Last Admin   sodium chloride flush (NS) 0.9 % injection 10 mL  10 mL Intravenous PRN Ladene Artist, MD   10 mL at 10/18/23 1310    Allergies as of 11/17/2023 - Review Complete 11/17/2023  Allergen Reaction Noted   Penicillins Other (See Comments) 05/31/2007    Social History   Socioeconomic History   Marital status: Married    Spouse name: Not on file   Number of children: 1   Years of education: Not on file   Highest education level: Not on file  Occupational History   Occupation: Retired  Tobacco Use   Smoking status: Never   Smokeless tobacco: Never  Vaping Use   Vaping status: Never Used  Substance and Sexual Activity   Alcohol use: Yes    Comment: occaional   Drug use: Never   Sexual activity: Yes  Other Topics Concern   Not on file  Social History Narrative   Not on file   Social Drivers of Health   Financial Resource Strain: Low Risk  (08/09/2023)   Overall Financial Resource Strain (CARDIA)    Difficulty of Paying Living Expenses: Not hard at all  Food Insecurity: No Food Insecurity (11/18/2023)   Hunger Vital Sign    Worried About  Running Out of Food in the Last Year: Never true    Ran Out of Food in the Last Year: Never true  Transportation Needs: No Transportation Needs (11/18/2023)   PRAPARE - Scientist, research (physical sciences) (Medical): No    Lack of Transportation (Non-Medical): No  Physical Activity: Inactive (08/11/2022)   Exercise Vital Sign    Days of Exercise per Week: 0 days    Minutes of Exercise per Session: 0 min  Stress: No Stress Concern Present (08/09/2023)   [employer] of Occupational Health - Occupational Stress Questionnaire    Feeling of Stress : Not at all  Social Connections: Moderately Integrated (11/18/2023)   Social Connection and Isolation Panel [NHANES]    Frequency of Communication with Friends and Family: More than three times a week    Frequency of Social Gatherings with Friends and Family: Twice a week    Attends Religious Services: More than 4 times per year    Active Member of [employer] or Organizations: No    Attends Banker Meetings: Never    Marital Status: Married  Catering manager Violence: Not At Risk (11/18/2023)   Humiliation, Afraid, Rape, and Kick questionnaire    Fear of Current or Ex-Partner: No    Emotionally Abused: No    Physically Abused: No    Sexually Abused: No     Code Status   Code Status: Full Code  Review of Systems: All systems reviewed and negative except where noted in HPI.  Physical Exam: Vital signs in last 24 hours: Temp:  [97.8 F (36.6 C)-100.3 F (37.9 C)] 98.3 F (36.8 C) (02/27 1339) Pulse Rate:  [71-142] 82 (02/27 1339) Resp:  [17-24] 19 (02/27 1200) BP: (93-130)/(55-71) 117/66 (02/27 1339) SpO2:  [91 %-98 %] 98 % (02/27 1339) Weight:  [77 kg] 77 kg (02/26 1708)    General:  Pleasant male in NAD Psych:  Cooperative. Normal mood and affect Eyes: Pupils equal Ears:  Normal auditory acuity Nose: No deformity, discharge or lesions Neck:  Supple, no masses felt Lungs:  Clear to auscultation.  Heart:  Regular rate, regular rhythm.  Abdomen:  Soft, nondistended, nontender, active bowel sounds, no masses felt Rectal :  Deferred Msk: Symmetrical without gross deformities.  Neurologic:  Alert,  oriented, grossly normal neurologically Extremities : No edema Skin:  Intact without significant lesions.    Intake/Output from previous day: 02/26 0701 - 02/27 0700 In: 2151.8 [I.V.:753.4; IV Piggyback:1398.4] Out: -  Intake/Output this shift:  No intake/output data recorded.   Willette Cluster, NP-C   11/18/2023, 3:39 PM  GI ATTENDING  History, laboratories, x-rays, prior surgical operative notes all personally reviewed.  Patient personally seen and examined.  Wife in room.  Agree with comprehensive consultation note as outlined above. 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  Presents to the hospital with severe abdominal pain, jaundice, and fever.  Noted to have leukocytosis.  CT imaging reveals biliary dilation.  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  Feeling better on antibiotics.  MRCP pending.  Recommend continuing antibiotics and following up on MRCP.  Interventional radiology has been consulted as he will need percutaneous biliary drainage.  He is not a candidate for ERCP due to his surgical anatomy.  I explained all of this to the patient and his wife and provided them with an anatomic diagram.  Wilhemina Bonito. Eda Keys., M.D. [facility] Division of  Gastroenterology

## 2023-11-18 NOTE — ED Provider Notes (Signed)
 Patient is awaiting bed placement at [facility].  Patient is wife reports he is improved this morning relative to last night.  Patient does not have any complaints of pain or nausea at this time.  Patient's wife inquired as to whether or not Dr. Truett Perna might be able to see them here at [facility] as he is working in the office. Physical Exam  BP 103/63   Pulse 82   Temp 98.1 F (36.7 C) (Oral)   Resp 17   Ht 5\' 11"  (1.803 m)   Wt 77 kg   SpO2 96%   BMI 23.68 kg/m   Physical Exam Constitutional:      Comments: Alert.  No acute distress.  Mental status clear.  Pulmonary:     Effort: Pulmonary effort is normal.     Procedures  Procedures  ED Course / MDM    Medical Decision Making Amount and/or Complexity of Data Reviewed Labs: ordered. Radiology: ordered.  Risk OTC drugs. Prescription drug management. Decision regarding hospitalization.   I have reviewed the patient's CT results and chest x-ray with the patient's wife.  I have reviewed white blood cell count from yesterday and lactic.  Patient's vital signs are improved.  Heart rate is 82, blood pressure 103/63, oxygen saturation 96%.  Patient is currently stable.  He has orders for scheduled antibiotics.  Advised to let me know if there was any pain or nausea needs.  I messaged Dr. Truett Perna.  At this time he cannot come down from his office to do consult the emergency department.  He advised at this time he is going to hold patient's oral chemotherapy and it appears that most of the problems are GI related and [facility] is already involved for care once he gets to the [facility].  I added a.m. CBC and metabolic panel as well as lactic.       Arby Barrette, MD 11/18/23 [phone]

## 2023-11-19 ENCOUNTER — Inpatient Hospital Stay (HOSPITAL_COMMUNITY): Payer: Medicare Other

## 2023-11-19 DIAGNOSIS — K831 Obstruction of bile duct: Secondary | ICD-10-CM | POA: Diagnosis not present

## 2023-11-19 DIAGNOSIS — K8309 Other cholangitis: Secondary | ICD-10-CM | POA: Diagnosis not present

## 2023-11-19 DIAGNOSIS — Z9049 Acquired absence of other specified parts of digestive tract: Secondary | ICD-10-CM

## 2023-11-19 HISTORY — PX: IR INT EXT BILIARY DRAIN WITH CHOLANGIOGRAM: IMG6044

## 2023-11-19 LAB — COMPREHENSIVE METABOLIC PANEL
ALT: 83 U/L — ABNORMAL HIGH (ref 0–44)
AST: 82 U/L — ABNORMAL HIGH (ref 15–41)
Albumin: 2.4 g/dL — ABNORMAL LOW (ref 3.5–5.0)
Alkaline Phosphatase: 188 U/L — ABNORMAL HIGH (ref 38–126)
Anion gap: 7 (ref 5–15)
BUN: 13 mg/dL (ref 8–23)
CO2: 23 mmol/L (ref 22–32)
Calcium: 8.2 mg/dL — ABNORMAL LOW (ref 8.9–10.3)
Chloride: 103 mmol/L (ref 98–111)
Creatinine, Ser: 0.79 mg/dL (ref 0.61–1.24)
GFR, Estimated: >60 mL/min (ref >60)
Glucose, Bld: 111 mg/dL — ABNORMAL HIGH (ref 70–99)
Potassium: 4 mmol/L (ref 3.5–5.1)
Sodium: 133 mmol/L — ABNORMAL LOW (ref 135–145)
Total Bilirubin: 5.3 mg/dL — ABNORMAL HIGH (ref 0.0–1.2)
Total Protein: 5.3 g/dL — ABNORMAL LOW (ref 6.5–8.1)

## 2023-11-19 LAB — CBC
HCT: 36 % — ABNORMAL LOW (ref 39.0–52.0)
Hemoglobin: 12.2 g/dL — ABNORMAL LOW (ref 13.0–17.0)
MCH: 32.8 pg (ref 26.0–34.0)
MCHC: 33.9 g/dL (ref 30.0–36.0)
MCV: 96.8 fL (ref 80.0–100.0)
Platelets: 179 10*3/uL (ref 150–400)
RBC: 3.72 MIL/uL — ABNORMAL LOW (ref 4.22–5.81)
RDW: 16 % — ABNORMAL HIGH (ref 11.5–15.5)
WBC: 18.3 10*3/uL — ABNORMAL HIGH (ref 4.0–10.5)
nRBC: 0 % (ref 0.0–0.2)

## 2023-11-19 LAB — PROTIME-INR
INR: 1.5 — ABNORMAL HIGH (ref 0.8–1.2)
Prothrombin Time: 18 s — ABNORMAL HIGH (ref 11.4–15.2)

## 2023-11-19 MED ORDER — SODIUM CHLORIDE 0.9 % IV SOLN: INTRAVENOUS | Status: AC

## 2023-11-19 MED ORDER — SODIUM CHLORIDE 0.9 % IV SOLN
2.0000 g | Freq: Once | INTRAVENOUS | Status: AC
  Administered 2023-11-19: 2 g via INTRAVENOUS

## 2023-11-19 MED ORDER — SODIUM CHLORIDE 0.9% FLUSH
10.0000 mL | Freq: Three times a day (TID) | INTRAVENOUS | Status: DC
  Administered 2023-11-19 – 2023-11-21 (×6): 10 mL

## 2023-11-19 MED ORDER — MIDAZOLAM HCL 2 MG/2ML IJ SOLN
INTRAMUSCULAR | Status: AC | PRN
  Administered 2023-11-19: 1 mg via INTRAVENOUS

## 2023-11-19 MED ORDER — FENTANYL CITRATE (PF) 100 MCG/2ML IJ SOLN
INTRAMUSCULAR | Status: AC | PRN
  Administered 2023-11-19: 50 ug via INTRAVENOUS

## 2023-11-19 MED ORDER — FENTANYL CITRATE (PF) 100 MCG/2ML IJ SOLN
INTRAMUSCULAR | Status: AC
  Filled 2023-11-19: qty 4

## 2023-11-19 MED ORDER — IOHEXOL 300 MG/ML  SOLN
50.0000 mL | Freq: Once | INTRAMUSCULAR | Status: AC | PRN
  Administered 2023-11-19: 20 mL

## 2023-11-19 MED ORDER — DIPHENHYDRAMINE HCL 50 MG/ML IJ SOLN
INTRAMUSCULAR | Status: AC
  Filled 2023-11-19: qty 1

## 2023-11-19 MED ORDER — SODIUM CHLORIDE 0.9 % IV SOLN
INTRAVENOUS | Status: AC
  Filled 2023-11-19: qty 2

## 2023-11-19 MED ORDER — MIDAZOLAM HCL 2 MG/2ML IJ SOLN
INTRAMUSCULAR | Status: AC
  Filled 2023-11-19: qty 6

## 2023-11-19 MED ORDER — DIPHENHYDRAMINE HCL 50 MG/ML IJ SOLN
INTRAMUSCULAR | Status: AC | PRN
  Administered 2023-11-19: 25 mg via INTRAVENOUS

## 2023-11-19 MED ORDER — LIDOCAINE HCL 1 % IJ SOLN
INTRAMUSCULAR | Status: AC
  Filled 2023-11-19: qty 20

## 2023-11-19 MED ORDER — LIDOCAINE-EPINEPHRINE 1 %-1:100000 IJ SOLN
20.0000 mL | Freq: Once | INTRAMUSCULAR | Status: AC
Start: 2023-11-19 — End: 2023-11-19
  Administered 2023-11-19: 10 mL via INTRADERMAL
  Filled 2023-11-19: qty 20

## 2023-11-19 NOTE — Progress Notes (Signed)
   11/19/23 1540  TOC Brief Assessment  Insurance and Status Reviewed  Patient has primary care physician Yes Jonny Ruiz, Len Blalock, MD)  Home environment has been reviewed from home with spouse  Prior level of function: Independent  Prior/Current Home Services No current home services  Social Drivers of Health Review SDOH reviewed no interventions necessary  Readmission risk has been reviewed Yes  Transition of care needs no transition of care needs at this time   CM at bedside patient and wife confirmed above info. No TOC needs at this time. TOC following.

## 2023-11-19 NOTE — Progress Notes (Signed)
 PROGRESS NOTE  Charles Lawson:[id number] DOB: 1940-12-07 DOA: 11/17/2023 PCP: Corwin Levins, MD  HPI/Recap of past 24 hours: Charles Lawson is a 83 y.o. 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  He is currently under the care of Dr. Truett Perna, receiving gemcitabine/capecitabine and Xeloda. On 11/01/2023 he started cycle 5 of his chemotherapy, he was noted to have recurrent elevation of liver enzymes but remained asymptomatic, until prior to presentation with c/o sudden onset of epigastric abdominal pain, nausea. In the ED, found to have significant elevation in his LFTs as well as leukocytosis.  Started on empiric antibiotics for cholangitis, admitted to the hospitalist service.    Today, saw pt prior to procedure, denies any new complaints. Wife at bedside.   Assessment/Plan: Principal Problem:   Cholangitis Active Problems:   Epigastric abdominal pain   Malignant neoplasm of pancreas (HCC)   Biliary obstruction   Leukocytosis   Abnormal CT of the abdomen   Severe sepsis likely 2/2 possible acute cholangitis  Presented with tachycardia, leukocytosis, initial lactate 2.9 Currently afebrile with leukocytosis LA 2.9-->2.1, will trend Blood cultures X 2 NGTD CT abdomen/pelvis showed increase in intrahepatic biliary ductal dilatation and prominence of common hepatic duct GI consulted, recommend MRCP and percutaneous biliary drain (unable to perform ERCP given his anatomy post Whipple procedure) MRCP showed progressive moderate intrahepatic and extra hepatic biliary ductal dilatation, with some noted filling defects around the distal common duct.  Due to this, PET/CT may be beneficial to exclude a malignant component IR consulted, s/p image guided internal/external percutaneous biliary drain on 11/19/2023 Continue empiric IV  ciprofloxacin and IV Flagyl Started on IV fluids, pain management Monitor closely, start clear liquid diet  Transaminitis Likely 2/2 above, with underlying chemotherapy Trend CMP  Ampullary carcinoma Currently under the care of Dr. Truett Perna Hold Xeloda and chemo therapy for now Continue Creon   GERD Protonix p.o  Chronic hypokalemia Continue home potassium supplementation Daily CMP    Estimated body mass index is 23.68 kg/m as calculated from the following:   Height as of this encounter: 5\' 11"  (1.803 m).   Weight as of this encounter: 77 kg.     Code Status: Full  Family Communication: Wife at bedside  Disposition Plan: Status is: Inpatient Remains inpatient appropriate because: Level of care     Consultants: GI IR  Procedures: Percutaneous biliary drainage  Antimicrobials: Metronidazole Ciprofloxacin  DVT prophylaxis: Lovenox   Objective: Vitals:   11/19/23 1320 11/19/23 1325 11/19/23 1330 11/19/23 1333  BP: 115/70 116/68 111/72   Pulse: 78 77 80 78  Resp: 20 (!) 23 10 17   Temp:      TempSrc:      SpO2: 97% 97% 94% 95%  Weight:      Height:        Intake/Output Summary (Last 24 hours) at 11/19/2023 1347 Last data filed at 11/19/2023 4742 Gross per 24 hour  Intake 795.47 ml  Output --  Net 795.47 ml   Filed Weights   11/17/23 1708  Weight: 77 kg    Exam: General: NAD, jaundiced Cardiovascular: S1, S2 present Respiratory: CTAB Abdomen: Soft, nontender, nondistended, bowel sounds present Musculoskeletal: No bilateral pedal edema noted Skin: Normal Psychiatry: Normal mood     Data Reviewed: CBC: Recent Labs  Lab 11/15/23 1132 11/17/23 1710 11/18/23 1007 11/19/23 0453  WBC 6.8  20.4* 30.4* 18.3*  NEUTROABS 3.8  --  27.2*  --   HGB 14.2 13.8 12.8* 12.2*  HCT 42.1 41.2 37.4* 36.0*  MCV 95.5 96.9 95.7 96.8  PLT 233 303 221 179   Basic Metabolic Panel: Recent Labs  Lab 11/15/23 1132 11/17/23 1710 11/18/23 1007  11/19/23 0453  NA 135 136 137 133*  K 4.4 3.8 4.4 4.0  CL 101 101 107 103  CO2 26 26 23 23   GLUCOSE 127* 160* 130* 111*  BUN 12 9 11 13   CREATININE 0.80 0.92 0.81 0.79  CALCIUM 9.1 9.1 8.1* 8.2*   GFR: Estimated Creatinine Clearance: 78.4 mL/min (by C-G formula based on SCr of 0.79 mg/dL). Liver Function Tests: Recent Labs  Lab 11/15/23 1132 11/17/23 1710 11/18/23 1007 11/19/23 0453  AST 47* 141* 127* 82*  ALT 56* 94* 112* 83*  ALKPHOS 235* 279* 226* 188*  BILITOT 1.9* 3.3* 5.5* 5.3*  PROT 7.1 6.6 5.5* 5.3*  ALBUMIN 3.9 3.8 3.2* 2.4*   Recent Labs  Lab 11/17/23 1710  LIPASE <10*   No results for input(s): "AMMONIA" in the last 168 hours. Coagulation Profile: Recent Labs  Lab 11/19/23 1032  INR 1.5*   Cardiac Enzymes: No results for input(s): "CKTOTAL", "CKMB", "CKMBINDEX", "TROPONINI" in the last 168 hours. BNP (last 3 results) No results for input(s): "PROBNP" in the last 8760 hours. HbA1C: No results for input(s): "HGBA1C" in the last 72 hours. CBG: No results for input(s): "GLUCAP" in the last 168 hours. Lipid Profile: No results for input(s): "CHOL", "HDL", "LDLCALC", "TRIG", "CHOLHDL", "LDLDIRECT" in the last 72 hours. Thyroid Function Tests: No results for input(s): "TSH", "T4TOTAL", "FREET4", "T3FREE", "THYROIDAB" in the last 72 hours. Anemia Panel: No results for input(s): "VITAMINB12", "FOLATE", "FERRITIN", "TIBC", "IRON", "RETICCTPCT" in the last 72 hours. Urine analysis:    Component Value Date/Time   COLORURINE YELLOW 11/17/2023 1710   APPEARANCEUR CLEAR 11/17/2023 1710   LABSPEC 1.018 11/17/2023 1710   PHURINE 5.5 11/17/2023 1710   GLUCOSEU NEGATIVE 11/17/2023 1710   GLUCOSEU NEGATIVE 06/24/2022 1103   HGBUR NEGATIVE 11/17/2023 1710   HGBUR negative 01/30/2010 0815   BILIRUBINUR SMALL (A) 11/17/2023 1710   KETONESUR NEGATIVE 11/17/2023 1710   PROTEINUR NEGATIVE 11/17/2023 1710   UROBILINOGEN 0.2 06/24/2022 1103   NITRITE NEGATIVE  11/17/2023 1710   LEUKOCYTESUR NEGATIVE 11/17/2023 1710   Sepsis Labs: @LABRCNTIP (procalcitonin:4,lacticidven:4)  ) Recent Results (from the past 240 hours)  Resp panel by RT-PCR (RSV, Flu A&B, Covid) Anterior Nasal Swab     Status: None   Collection Time: 11/17/23  5:10 PM   Specimen: Anterior Nasal Swab  Result Value Ref Range Status   SARS Coronavirus 2 by RT PCR NEGATIVE NEGATIVE Final    Comment: (NOTE) SARS-CoV-2 target nucleic acids are NOT DETECTED.  The SARS-CoV-2 RNA is generally detectable in upper respiratory specimens during the acute phase of infection. The lowest concentration of SARS-CoV-2 viral copies this assay can detect is 138 copies/mL. A negative result does not preclude SARS-Cov-2 infection and should not be used as the sole basis for treatment or other patient management decisions. A negative result may occur with  improper specimen collection/handling, submission of specimen other than nasopharyngeal swab, presence of viral mutation(s) within the areas targeted by this assay, and inadequate number of viral copies(<138 copies/mL). A negative result must be combined with clinical observations, patient history, and epidemiological information. The expected result is Negative.  Fact Sheet for Patients:  BloggerCourse.com  Fact Sheet for Healthcare Providers:  SeriousBroker.it  This test is no t yet approved or cleared by the [place] and  has been authorized for detection and/or diagnosis of SARS-CoV-2 by FDA under an Emergency Use Authorization (EUA). This EUA will remain  in effect (meaning this test can be used) for the duration of the COVID-19 declaration under Section 564(b)(1) of the Act, 21 U.S.C.section 360bbb-3(b)(1), unless the authorization is terminated  or revoked sooner.       Influenza A by PCR NEGATIVE NEGATIVE Final   Influenza B by PCR NEGATIVE NEGATIVE Final    Comment:  (NOTE) The Xpert Xpress SARS-CoV-2/FLU/RSV plus assay is intended as an aid in the diagnosis of influenza from Nasopharyngeal swab specimens and should not be used as a sole basis for treatment. Nasal washings and aspirates are unacceptable for Xpert Xpress SARS-CoV-2/FLU/RSV testing.  Fact Sheet for Patients: BloggerCourse.com  Fact Sheet for Healthcare Providers: SeriousBroker.it  This test is not yet approved or cleared by the [place] FDA and has been authorized for detection and/or diagnosis of SARS-CoV-2 by FDA under an Emergency Use Authorization (EUA). This EUA will remain in effect (meaning this test can be used) for the duration of the COVID-19 declaration under Section 564(b)(1) of the Act, 21 U.S.C. section 360bbb-3(b)(1), unless the authorization is terminated or revoked.     Resp Syncytial Virus by PCR NEGATIVE NEGATIVE Final    Comment: (NOTE) Fact Sheet for Patients: BloggerCourse.com  Fact Sheet for Healthcare Providers: SeriousBroker.it  This test is not yet approved or cleared by the [place] FDA and has been authorized for detection and/or diagnosis of SARS-CoV-2 by FDA under an Emergency Use Authorization (EUA). This EUA will remain in effect (meaning this test can be used) for the duration of the COVID-19 declaration under Section 564(b)(1) of the Act, 21 U.S.C. section 360bbb-3(b)(1), unless the authorization is terminated or revoked.  Performed at [facility], [address] [place], [place] [address]   Blood culture (routine x 2)     Status: None (Preliminary result)   Collection Time: 11/17/23  9:55 PM   Specimen: BLOOD RIGHT ARM  Result Value Ref Range Status   Specimen Description   Final    BLOOD RIGHT ARM Performed at [facility], [address], [place], [place] [address]    Special  Requests   Final    BOTTLES DRAWN AEROBIC AND ANAEROBIC Blood Culture results may not be optimal due to an inadequate volume of blood received in culture bottles Performed at [facility], [address] [place], [place] [address]    Culture   Final    NO GROWTH < 24 HOURS Performed at [facility], [address], [place], [place] [address]    Report Status PENDING  Incomplete  Blood culture (routine x 2)     Status: None (Preliminary result)   Collection Time: 11/17/23 10:06 PM   Specimen: BLOOD RIGHT HAND  Result Value Ref Range Status   Specimen Description   Final    BLOOD RIGHT HAND Performed at [facility], [address], [place], [place] [address]    Special Requests   Final    BOTTLES DRAWN AEROBIC AND ANAEROBIC Blood Culture results may not be optimal due to an inadequate volume of blood received in culture bottles Performed at [facility], [address] [place], [place] [address]    Culture   Final    NO GROWTH < 24 HOURS Performed at [facility], [address] [place], [place] [address]    Report Status PENDING  Incomplete      Studies: MR ABDOMEN MRCP W WO CONTAST Result Date: 11/19/2023 CLINICAL DATA:  History of ampullary carcinoma with prior Whipple procedure in August 2024. Worsening biliary ductal dilatation. EXAM: MRI ABDOMEN WITHOUT AND WITH CONTRAST (INCLUDING MRCP) TECHNIQUE: Multiplanar multisequence MR imaging of the abdomen was performed both before and after the administration of intravenous contrast. Heavily T2-weighted images of the biliary and pancreatic ducts were obtained, and three-dimensional MRCP images were rendered by post processing. CONTRAST:  8mL GADAVIST GADOBUTROL 1 MMOL/ML IV SOLN COMPARISON:  MRI 10/22/2023.  CT 11/17/2023. FINDINGS: Lower chest: Trace bilateral pleural fluid. Adjacent opacities as well. Atelectasis is favored. Hepatobiliary: Liver parenchyma is  grossly preserved. No restricted diffusion, enhancing space-occupying mass lesion. No abnormal T2 signal. Portal vein is patent centrally for left portal vein appears slightly atretic. There is increasing intrahepatic biliary ductal dilatation compared to the previous MRI. This is diffuse. The common duct does have some abrupt tapering and heterogeneous signal this extends to the area of the bowel anastomosis. There is some wall enhancement along the wall of the distal common duct at the level tapering. Please correlate with coronal series 20, image 30. On the MRCP datasets there is some filling defects within the course of the common duct. This could be debris or other process. In addition at the level of the anastomosis the bowel is some ill-defined soft tissue with spiculations, mixed signal on T2 and progressive enhancement on dynamic study. Favor this being fibrosis. This extends further along the course of the superior mesenteric vessels in the central mesentery. No restricted diffusion along this course. Gallbladder is surgically absent. Pancreas: Mildly atrophic. Changes from Whipple procedure. No new pancreatic mass. Spleen: Within normal limits in size and appearance. Small splenule. Adrenals/Urinary Tract: The adrenal glands are preserved. No enhancing renal mass or collecting system dilatation. Stomach/Bowel: Visualized bowel in the upper abdomen is nondilated. Scattered colonic stool. Vascular/Lymphatic: Atherosclerotic changes along the aorta. Normal caliber aorta and IVC. No discrete abnormal lymph node enlargement identified in the visualized abdomen. Other:  Increasing stranding and trace right upper quadrant ascites. Musculoskeletal: Diffuse degenerative changes along the spine. IMPRESSION: Again surgical changes of Whipple procedure for ampullary carcinoma. There is progressive moderate intrahepatic and extrahepatic biliary ductal dilatation with tapering of the common duct towards the  anastomosis. At this location there is some filling defect noted along the course of the distal common duct. This would have a differential including debris. There is also persistent spiculated soft tissue at the anastomotic site. Based on appearance this could be fibrotic tissue or scar. However with the progressive biliary ductal dilatation and the slight enhancement of the wall of the distal common duct recommend further evaluation. PET-CT scan may be of some benefit to further define the nature of this tissue and exclude a malignant component. No separate space-occupying liver lesion, nodal enlargement. Increasing right upper quadrant stranding and trace ascites. Electronically Signed   By: Karen Kays M.D.   On: 11/19/2023 10:20   MR 3D Recon At Scanner Result Date: 11/19/2023 CLINICAL DATA:  History of ampullary carcinoma with prior Whipple procedure in August 2024. Worsening biliary ductal dilatation. EXAM: MRI ABDOMEN WITHOUT AND WITH CONTRAST (INCLUDING MRCP) TECHNIQUE: Multiplanar multisequence MR imaging of the abdomen was performed both before and after the administration of intravenous contrast. Heavily T2-weighted images of the biliary and pancreatic ducts were obtained, and three-dimensional MRCP  images were rendered by post processing. CONTRAST:  8mL GADAVIST GADOBUTROL 1 MMOL/ML IV SOLN COMPARISON:  MRI 10/22/2023.  CT 11/17/2023. FINDINGS: Lower chest: Trace bilateral pleural fluid. Adjacent opacities as well. Atelectasis is favored. Hepatobiliary: Liver parenchyma is grossly preserved. No restricted diffusion, enhancing space-occupying mass lesion. No abnormal T2 signal. Portal vein is patent centrally for left portal vein appears slightly atretic. There is increasing intrahepatic biliary ductal dilatation compared to the previous MRI. This is diffuse. The common duct does have some abrupt tapering and heterogeneous signal this extends to the area of the bowel anastomosis. There is some wall  enhancement along the wall of the distal common duct at the level tapering. Please correlate with coronal series 20, image 30. On the MRCP datasets there is some filling defects within the course of the common duct. This could be debris or other process. In addition at the level of the anastomosis the bowel is some ill-defined soft tissue with spiculations, mixed signal on T2 and progressive enhancement on dynamic study. Favor this being fibrosis. This extends further along the course of the superior mesenteric vessels in the central mesentery. No restricted diffusion along this course. Gallbladder is surgically absent. Pancreas: Mildly atrophic. Changes from Whipple procedure. No new pancreatic mass. Spleen: Within normal limits in size and appearance. Small splenule. Adrenals/Urinary Tract: The adrenal glands are preserved. No enhancing renal mass or collecting system dilatation. Stomach/Bowel: Visualized bowel in the upper abdomen is nondilated. Scattered colonic stool. Vascular/Lymphatic: Atherosclerotic changes along the aorta. Normal caliber aorta and IVC. No discrete abnormal lymph node enlargement identified in the visualized abdomen. Other:  Increasing stranding and trace right upper quadrant ascites. Musculoskeletal: Diffuse degenerative changes along the spine. IMPRESSION: Again surgical changes of Whipple procedure for ampullary carcinoma. There is progressive moderate intrahepatic and extrahepatic biliary ductal dilatation with tapering of the common duct towards the anastomosis. At this location there is some filling defect noted along the course of the distal common duct. This would have a differential including debris. There is also persistent spiculated soft tissue at the anastomotic site. Based on appearance this could be fibrotic tissue or scar. However with the progressive biliary ductal dilatation and the slight enhancement of the wall of the distal common duct recommend further evaluation.  PET-CT scan may be of some benefit to further define the nature of this tissue and exclude a malignant component. No separate space-occupying liver lesion, nodal enlargement. Increasing right upper quadrant stranding and trace ascites. Electronically Signed   By: Karen Kays M.D.   On: 11/19/2023 10:20    Scheduled Meds:  Chlorhexidine Gluconate Cloth  6 each Topical Daily   enoxaparin (LOVENOX) injection  40 mg Subcutaneous Q24H   lipase/protease/amylase  12,000 Units Oral TID AC   pantoprazole  40 mg Oral Daily   potassium chloride  10 mEq Oral BID   sodium chloride flush  10-40 mL Intracatheter Q12H   tamsulosin  0.4 mg Oral Daily    Continuous Infusions:  sodium chloride 75 mL/hr at 11/19/23 0731   ciprofloxacin 200 mL/hr at 11/19/23 1610   metronidazole 500 mg (11/19/23 1008)     LOS: 1 day     Briant Cedar, MD [facility]  If 7PM-7AM, please contact night-coverage [email or web address] 11/19/2023, 1:47 PM

## 2023-11-19 NOTE — Consult Note (Signed)
 Chief Complaint: pancreatic cancer with biliary obstruction; cholangitis - referred for image guided percutaneous transhepatic cholangiogram with biliary drain placement  Referring Provider(s): Perry,J  Supervising Physician: Gilmer Mor  Patient Status: [facility] - In-pt  History of Present Illness: Charles Lawson is an 83 y.o. male with a history of hyperlipidemia, h. Pylori, and ampullary cancer.  Pt is s/p Whipple 04/2023 and currently undergoing chemotherapy with his last treatment 11/15/23.  He presented to the emergency department on 11/17/23 with complaints of epigastric abdominal pain, nausea, and chills.  A CT A/P was obtained 11/17/23 revealing increasing intrahepatic biliary ductal dilation and prominent soft tissue in the porta hepatis. His labs are notable for leukocytosis and increasing LFTs.  He was subsequently admitted for empiric antibiotics; an MRCP was obtained 11/18/23 revealing he is not eligible for ERCP.  Interventional radiology was consulted for image guided percutaneous transhepatic cholangiogram.  Imaging was reviewed and approved by Dr. Loreta Ave 11/19/23 for image guided percutaneous transhepatic cholangiogram with biliary drain placement today.      Patient is Full Code  Past Medical History:  Diagnosis Date   Allergic rhinitis, cause unspecified    Arthritis    Blood pressure elevated without history of HTN    BPH (benign prostatic hyperplasia)    Diverticulosis of colon    ED (erectile dysfunction)    Helicobacter pylori gastritis 04/2023   Tx quad tx and eradication documented neg stool ag 07/2023   History of basal cell carcinoma (BCC) excision    Hyperlipidemia    Impaired glucose tolerance 04/03/2013   Personal history of colonic polyps 06/07/2012    Past Surgical History:  Procedure Laterality Date   Quintella Reichert OSTEOTOMY Left 07/19/2020   Procedure: Quintella Reichert OSTEOTOMY;  Surgeon: Edwin Cap, DPM;  Location: [facility];  Service: Podiatry;   Laterality: Left;   BIOPSY  05/10/2023   Procedure: BIOPSY;  Surgeon: Iva Boop, MD;  Location: [facility];  Service: Gastroenterology;;   BIOPSY  05/12/2023   Procedure: BIOPSY;  Surgeon: Iva Boop, MD;  Location: [facility];  Service: Gastroenterology;;   Arbutus Leas Left 07/19/2020   Procedure: Delos Haring;  Surgeon: Edwin Cap, DPM;  Location: [facility];  Service: Podiatry;  Laterality: Left;   COLONOSCOPY  last one 10-06-2017  dr Leone Payor   ESOPHAGOGASTRODUODENOSCOPY N/A 05/12/2023   Procedure: ESOPHAGOGASTRODUODENOSCOPY (EGD);  Surgeon: Iva Boop, MD;  Location: [facility];  Service: Gastroenterology;  Laterality: N/A;   ESOPHAGOGASTRODUODENOSCOPY (EGD) WITH PROPOFOL N/A 05/10/2023   Procedure: ESOPHAGOGASTRODUODENOSCOPY (EGD) WITH PROPOFOL;  Surgeon: Iva Boop, MD;  Location: [facility];  Service: Gastroenterology;  Laterality: N/A;   HAMMER TOE SURGERY Left 07/19/2020   Procedure: HAMMER TOE CORRECTION;  Surgeon: Edwin Cap, DPM;  Location: [facility];  Service: Podiatry;  Laterality: Left;   INGUINAL HERNIA REPAIR Right 11-21-2008  @ [facility]   IR IMAGING GUIDED PORT INSERTION  06/18/2023   LUMBAR LAMINECTOMY  1974   METATARSAL OSTEOTOMY Left 07/19/2020   Procedure: METATARSAL OSTEOTOMY;  Surgeon: Edwin Cap, DPM;  Location: [facility];  Service: Podiatry;  Laterality: Left;   ORIF TOE FRACTURE Left 07/19/2020   Procedure: OPEN TREATMENT OF METARSAL PHALANGEAL JOINT DISLOCATION;  Surgeon: Edwin Cap, DPM;  Location: [facility];  Service: Podiatry;  Laterality: Left;  ANKLE BLOCK WITH THIGH TOURNIQUET    Allergies: Penicillins  Medications: Prior to Admission medications   Medication Sig Start Date  End Date Taking? Authorizing Provider  acetaminophen (TYLENOL) 650 MG CR tablet Take 500 mg by mouth every 8 (eight) hours as needed for pain. 05/27/23: reports during  [facility] call taking after recent surgery at [facility]   Yes Corwin Levins, MD  capecitabine (XELODA) 500 MG tablet Take #1 tablet (500 mg) in am and #2 tablets (1000 mg) in pm. Take for 21 days, then hold for 7 days. Repeat every 28 days. Start cycle on 11/29/2023 11/15/23  Yes Rana Snare, NP  Cyanocobalamin (VITAMIN B-12 PO) Take 1 tablet by mouth at bedtime.   Yes [provider]  lidocaine-prilocaine (EMLA) cream Apply 1 Application topically as needed. 11/15/23  Yes Rana Snare, NP  lipase/protease/amylase (CREON) 12000-38000 units CPEP capsule Take 1 capsule (12,000 Units total) by mouth 3 (three) times daily before meals. 07/22/23  Yes Rana Snare, NP  omeprazole (PRILOSEC) 20 MG capsule Take 1 capsule (20 mg total) by mouth daily. 08/16/23  Yes Corwin Levins, MD  ondansetron (ZOFRAN) 8 MG tablet Take 1 tablet (8 mg total) by mouth every 8 (eight) hours as needed for nausea. 11/04/23  Yes Ladene Artist, MD  potassium chloride (KLOR-CON M) 10 MEQ tablet Take 1 tablet (10 mEq total) by mouth 2 (two) times daily. 08/23/23  Yes Ladene Artist, MD  prochlorperazine (COMPAZINE) 10 MG tablet Take 1 tablet (10 mg total) by mouth every 6 (six) hours as needed for nausea or vomiting. 11/04/23  Yes Ladene Artist, MD  tamsulosin (FLOMAX) 0.4 MG CAPS capsule TAKE 1 CAPSULE (0.4 MG TOTAL) BY MOUTH DAILY. 05/27/23: REPORTS DURING TOC CALL TAKING AFTER RECENT SURGERY AT [facility] 11/10/23  Yes Corwin Levins, MD     Family History  Problem Relation Age of Onset   COPD Mother    Cancer Father    Lung cancer Father    Colon cancer Neg Hx    Colon polyps Neg Hx    Rectal cancer Neg Hx    Stomach cancer Neg Hx     Social History   Socioeconomic History   Marital status: Married    Spouse name: Not on file   Number of children: 1   Years of education: Not on file   Highest education level: Not on file  Occupational History   Occupation: Retired  Tobacco Use   Smoking status: Never    Smokeless tobacco: Never  Vaping Use   Vaping status: Never Used  Substance and Sexual Activity   Alcohol use: Yes    Comment: occaional   Drug use: Never   Sexual activity: Yes  Other Topics Concern   Not on file  Social History Narrative   Not on file   Social Drivers of Health   Financial Resource Strain: Low Risk  (08/09/2023)   Overall Financial Resource Strain (CARDIA)    Difficulty of Paying Living Expenses: Not hard at all  Food Insecurity: No Food Insecurity (11/18/2023)   Hunger Vital Sign    Worried About Running Out of Food in the Last Year: Never true    Ran Out of Food in the Last Year: Never true  Transportation Needs: No Transportation Needs (11/18/2023)   PRAPARE - Administrator, Civil Service (Medical): No    Lack of Transportation (Non-Medical): No  Physical Activity: Inactive (08/11/2022)   Exercise Vital Sign    Days of Exercise per Week: 0 days    Minutes of Exercise per Session: 0 min  Stress: No Stress Concern Present (08/09/2023)   [employer] of Occupational Health - Occupational Stress Questionnaire    Feeling of Stress : Not at all  Social Connections: Moderately Integrated (11/18/2023)   Social Connection and Isolation Panel [NHANES]    Frequency of Communication with Friends and Family: More than three times a week    Frequency of Social Gatherings with Friends and Family: Twice a week    Attends Religious Services: More than 4 times per year    Active Member of [employer] or Organizations: No    Attends Engineer, structural: Never    Marital Status: Married       Review of Systems; currently denies fever,HA,CP,dyspnea, cough, abd/back pain,N/V or bleeding; he is jaundiced  Vital Signs: BP 104/63 (BP Location: Right Arm)   Pulse 82   Temp 97.8 F (36.6 C) (Oral)   Resp 18   Ht 5\' 11"  (1.803 m)   Wt 169 lb 12.8 oz (77 kg)   SpO2 96%   BMI 23.68 kg/m   Advance Care Plan: no documents on file  Physical Exam:  awake/alert; scleral icterus noted; chest- few fine crackles right base, left clear; clean, intact rt chest port a cath; heart- RRR; abd-soft,+BS,NT; no LE edema  Imaging: DG Chest Portable 1 View Result Date: 11/17/2023 CLINICAL DATA:  Shortness of breath. Abdominal pain and nausea for 3 hours. Currently on chemotherapy for pancreatic cancer. EXAM: PORTABLE CHEST 1 VIEW COMPARISON:  10/09/2021 FINDINGS: Power port type central venous catheter with tip over the cavoatrial junction region. No pneumothorax. Heart size and pulmonary vascularity are normal. Shallow inspiration. There is infiltration or atelectasis in the left lung base. Right lung is clear. No pleural effusions. Mediastinal contours appear intact. IMPRESSION: Shallow inspiration with infiltration or atelectasis in the left lung base. Electronically Signed   By: Burman Nieves M.D.   On: 11/17/2023 23:58   CT ABDOMEN PELVIS W CONTRAST Result Date: 11/17/2023 CLINICAL DATA:  Epigastric pain. History of pancreatic cancer with Whipple. EXAM: CT ABDOMEN AND PELVIS WITH CONTRAST TECHNIQUE: Multidetector CT imaging of the abdomen and pelvis was performed using the standard protocol following bolus administration of intravenous contrast. RADIATION DOSE REDUCTION: This exam was performed according to the departmental dose-optimization program which includes automated exposure control, adjustment of the mA and/or kV according to patient size and/or use of iterative reconstruction technique. CONTRAST:  85mL OMNIPAQUE IOHEXOL 300 MG/ML  SOLN COMPARISON:  10/09/2023 FINDINGS: Lower chest: Dependent ground-glass opacities in the lower lobes, likely dependent atelectasis. No effusions. Hepatobiliary: [place]oledocho jejunostomy anatomy noted post Whipple procedure. Mild intrahepatic biliary ductal dilatation, increasing since the prior study. Common hepatic duct measures 9 mm in diameter compared to 7 mm previously. Soft tissue in the porta hepatis is stable since  prior study. There is slight stranding in the retroperitoneum adjacent to this soft tissue which is also stable and felt to reflect postoperative change. No focal hepatic abnormality. Pancreas: Status post Whipple and partial pancreatectomy. No pancreatic ductal dilatation or focal abnormality. Spleen: No focal abnormality or ductal dilatation. Adrenals/Urinary Tract: No focal abnormality.  Normal size. Stomach/Bowel: Prior partial gastrostomy and gastrojejunostomy. No visible complicating feature. No bowel obstruction or inflammatory process. Sigmoid diverticulosis. No active diverticulitis. Vascular/Lymphatic: No evidence of aneurysm or adenopathy. Aortic atherosclerosis. Reproductive: Mildly prominent prostate Other: No free fluid or free air. Musculoskeletal: No acute bony abnormality. IMPRESSION: Increasing intrahepatic biliary ductal dilatation and prominence of the common hepatic duct which terminates within prominent soft tissue in the  porta hepatis. This is similar/stable when compared to prior CT and MRI. Given the increasing biliary ductal dilatation, repeat MRI may be helpful to completely exclude residual recurrent mass. Aortic atherosclerosis. Sigmoid diverticulosis. Electronically Signed   By: Charlett Nose M.D.   On: 11/17/2023 21:08   MR ABDOMEN MRCP W WO CONTAST Result Date: 10/22/2023 CLINICAL DATA:  History ampullary cancer status post Whipple procedure. Undergoing chemotherapy. Recent increase in LFTs. EXAM: MRI ABDOMEN WITHOUT AND WITH CONTRAST (INCLUDING MRCP) TECHNIQUE: Multiplanar multisequence MR imaging of the abdomen was performed both before and after the administration of intravenous contrast. Heavily T2-weighted images of the biliary and pancreatic ducts were obtained, and three-dimensional MRCP images were rendered by post processing. CONTRAST:  8 cc Vueway COMPARISON:  CT scan 10/09/2023 FINDINGS: Lower chest: The lung bases are clear of an acute process. No infiltrates or  effusions. No pulmonary lesions. No pericardial effusion. Hepatobiliary: No hepatic lesions are identified to suggest metastatic disease. There is mild intrahepatic biliary dilatation. The common bile duct is normal in caliber but the wall is uniformly thickened and demonstrates contrast enhancement which could suggest inflammation/cholangitis. Do not see any definite enhancement involving the walls of the intrahepatic ducts. Pancreas: Status post Whipple procedure with expected postoperative changes. No pancreatic mass or acute inflammation. The main pancreatic duct is normal in caliber. Moderate atrophy of the pancreas. Spleen:  Normal size.  No splenic lesions. Adrenals/Urinary Tract: The adrenal glands are normal. No renal lesions or hydronephrosis. Stomach/Bowel: The stomach stable surgical changes from the Whipple procedure. I do not see any complicating features involving the. No inflammatory changes or obstructive findings. Vascular/Lymphatic: The aorta and branch vessels are patent. No mesenteric or retroperitoneal lymphadenopathy. Other:  No ascites or peritoneal implants. Musculoskeletal: No significant bony findings. IMPRESSION: 1. Status post Whipple procedure with expected postoperative changes. No complicating features. 2. No findings for metastatic disease involving the abdomen. 3. Mild intrahepatic biliary dilatation. The common bile duct is normal in caliber but the wall is uniformly thickened and demonstrates contrast enhancement which could suggest inflammation/cholangitis. Electronically Signed   By: Rudie Meyer M.D.   On: 10/22/2023 10:23    Labs:  CBC: Recent Labs    11/15/23 1132 11/17/23 1710 11/18/23 1007 11/19/23 0453  WBC 6.8 20.4* 30.4* 18.3*  HGB 14.2 13.8 12.8* 12.2*  HCT 42.1 41.2 37.4* 36.0*  PLT 233 303 221 179    COAGS: No results for input(s): "INR", "APTT" in the last 8760 hours.  BMP: Recent Labs    11/15/23 1132 11/17/23 1710 11/18/23 1007  11/19/23 0453  NA 135 136 137 133*  K 4.4 3.8 4.4 4.0  CL 101 101 107 103  CO2 26 26 23 23   GLUCOSE 127* 160* 130* 111*  BUN 12 9 11 13   CALCIUM 9.1 9.1 8.1* 8.2*  CREATININE 0.80 0.92 0.81 0.79  GFRNONAA >60 >60 >60 >60    LIVER FUNCTION TESTS: Recent Labs    11/15/23 1132 11/17/23 1710 11/18/23 1007 11/19/23 0453  BILITOT 1.9* 3.3* 5.5* 5.3*  AST 47* 141* 127* 82*  ALT 56* 94* 112* 83*  ALKPHOS 235* 279* 226* 188*  PROT 7.1 6.6 5.5* 5.3*  ALBUMIN 3.9 3.8 3.2* 2.4*    TUMOR MARKERS: No results for input(s): "AFPTM", "CEA", "CA199", "CHROMGRNA" in the last 8760 hours.  Assessment and Plan:  Pt with hx pancreatic cancer with prior Whipple procedure last year at [facility] ; now with biliary obstruction/ cholangitis scheduled for image guided percutaneous transhepatic cholangiogram  with biliary drain placement today.  Imaging was reviewed and approved by Dr. Loreta Ave   Risks and benefits discussed with the patient/spouse  including, but not limited to bleeding, infection, gallbladder perforation, bile leak, sepsis or even death.  All of the patient's questions were answered, patient is agreeable to proceed. Consent signed and in chart.  Thank you for allowing our service to participate in Charles Lawson 's care.  Electronically Signed: Loman Brooklyn, PA-C Caryn Bee Jajuan Skoog,PA-C  11/19/2023, 8:35 AM    I spent a total of 40 Minutes    in face to face in clinical consultation, greater than 50% of which was counseling/coordinating care for image guided percutaneous transhepatic cholangiogram with biliary drain placement

## 2023-11-19 NOTE — Procedures (Signed)
 Interventional Radiology Procedure Note  Procedure:   Image guided int/ext biliary drain.  19F to gravity.   Complications: None  Recommendations:  -Routine drain care.  TID flushes with 10cc sterile saline.  Do not aspirate - Do not submerge - Routine wound care - ok to advance diet per primary order - ok to restart any AC as needed   Signed,  Yvone Neu. Loreta Ave, DO

## 2023-11-19 NOTE — Progress Notes (Addendum)
 Daily Progress Note  DOA: 11/17/2023 Hospital Day: 3   Chief Complaint: Biliary obstruction / cholangitis   ASSESSMENT    Brief Narrative:  Charles Lawson is a 83 y.o. 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 Admitted with new biliary obstruction / cholangitis.   History of ampullary cancer s/p Whipple procedure Aug 2024 at [facility]). Undergoing chemotherapy. No evidence for metastatic disease on MRI / MRCP 10/22/23.    New biliary obstruction / cholangitis. MRI / MRCP today shows increasing intrahepatic and extrahepatic biliary ductal dilatation with tapering of the common duct towards the anastomosis. Possibly stricture  which could be benign or malignant.  TODAY:  WBC improved to 18.3. Not having any significant abdominal pain. Bili stable at 5.5.    Principal Problem:   Cholangitis Active Problems:   Epigastric abdominal pain   Malignant neoplasm of pancreas (HCC)   Biliary obstruction   Leukocytosis   Abnormal CT of the abdomen   PLAN   --Not candidate for ERCP given post Whipple anatomy. Interventional Radiology planning percutaneous biliary drain today.    Subjective   No abdominal pain. Overall feels okay. Waiting for IR to take him down   Objective   MRI / MRCP There is progressive moderate intrahepatic and extrahepatic biliary ductal dilatation with tapering of the common duct towards the anastomosis. At this location there is some filling defect noted along the course of the distal common duct. This would have a differential including debris. There is also persistent spiculated soft tissue at the anastomotic site. Based on appearance this could be fibrotic tissue or scar. However with the progressive biliary ductal dilatation and the slight enhancement of the wall of the distal common duct recommend further evaluation. PET-CT scan may be of some benefit to further define the nature of  this tissue and exclude a malignant component.No separate space-occupying liver lesion, nodal enlargement. Increasing right upper quadrant stranding and trace ascites.  Recent Labs    11/17/23 1710 11/18/23 1007 11/19/23 0453  WBC 20.4* 30.4* 18.3*  HGB 13.8 12.8* 12.2*  HCT 41.2 37.4* 36.0*  PLT 303 221 179   BMET Recent Labs    11/17/23 1710 11/18/23 1007 11/19/23 0453  NA 136 137 133*  K 3.8 4.4 4.0  CL 101 107 103  CO2 26 23 23   GLUCOSE 160* 130* 111*  BUN 9 11 13   CREATININE 0.92 0.81 0.79  CALCIUM 9.1 8.1* 8.2*   LFT Recent Labs    11/19/23 0453  PROT 5.3*  ALBUMIN 2.4*  AST 82*  ALT 83*  ALKPHOS 188*  BILITOT 5.3*   PT/INR Recent Labs    11/19/23 1032  LABPROT 18.0*  INR 1.5*     Imaging:  MR 3D Recon At Scanner CLINICAL DATA:  History of ampullary carcinoma with prior Whipple procedure in August 2024. Worsening biliary ductal dilatation.  EXAM: MRI ABDOMEN WITHOUT AND WITH CONTRAST (INCLUDING MRCP)  TECHNIQUE: Multiplanar multisequence MR imaging of the abdomen was performed both before and after the administration of intravenous contrast. Heavily T2-weighted images of the biliary and pancreatic ducts were obtained, and three-dimensional MRCP images were rendered by post processing.  CONTRAST:  8mL GADAVIST GADOBUTROL 1 MMOL/ML IV SOLN  COMPARISON:  MRI 10/22/2023.  CT 11/17/2023.  FINDINGS: Lower chest: Trace bilateral pleural fluid. Adjacent opacities as well. Atelectasis is favored.  Hepatobiliary: Liver parenchyma is grossly preserved. No restricted diffusion, enhancing  space-occupying mass lesion. No abnormal T2 signal. Portal vein is patent centrally for left portal vein appears slightly atretic.  There is increasing intrahepatic biliary ductal dilatation compared to the previous MRI. This is diffuse. The common duct does have some abrupt tapering and heterogeneous signal this extends to the area of the bowel anastomosis.  There is some wall enhancement along the wall of the distal common duct at the level tapering. Please correlate with coronal series 20, image 30. On the MRCP datasets there is some filling defects within the course of the common duct. This could be debris or other process. In addition at the level of the anastomosis the bowel is some ill-defined soft tissue with spiculations, mixed signal on T2 and progressive enhancement on dynamic study. Favor this being fibrosis. This extends further along the course of the superior mesenteric vessels in the central mesentery. No restricted diffusion along this course. Gallbladder is surgically absent.  Pancreas: Mildly atrophic. Changes from Whipple procedure. No new pancreatic mass.  Spleen: Within normal limits in size and appearance. Small splenule.  Adrenals/Urinary Tract: The adrenal glands are preserved. No enhancing renal mass or collecting system dilatation.  Stomach/Bowel: Visualized bowel in the upper abdomen is nondilated. Scattered colonic stool.  Vascular/Lymphatic: Atherosclerotic changes along the aorta. Normal caliber aorta and IVC. No discrete abnormal lymph node enlargement identified in the visualized abdomen.  Other:  Increasing stranding and trace right upper quadrant ascites.  Musculoskeletal: Diffuse degenerative changes along the spine.  IMPRESSION: Again surgical changes of Whipple procedure for ampullary carcinoma.  There is progressive moderate intrahepatic and extrahepatic biliary ductal dilatation with tapering of the common duct towards the anastomosis. At this location there is some filling defect noted along the course of the distal common duct. This would have a differential including debris. There is also persistent spiculated soft tissue at the anastomotic site. Based on appearance this could be fibrotic tissue or scar. However with the progressive biliary ductal dilatation and the slight enhancement of  the wall of the distal common duct recommend further evaluation. PET-CT scan may be of some benefit to further define the nature of this tissue and exclude a malignant component.  No separate space-occupying liver lesion, nodal enlargement.  Increasing right upper quadrant stranding and trace ascites.  Electronically Signed   By: Karen Kays M.D.   On: 11/19/2023 10:20 MR ABDOMEN MRCP W WO CONTAST CLINICAL DATA:  History of ampullary carcinoma with prior Whipple procedure in August 2024. Worsening biliary ductal dilatation.  EXAM: MRI ABDOMEN WITHOUT AND WITH CONTRAST (INCLUDING MRCP)  TECHNIQUE: Multiplanar multisequence MR imaging of the abdomen was performed both before and after the administration of intravenous contrast. Heavily T2-weighted images of the biliary and pancreatic ducts were obtained, and three-dimensional MRCP images were rendered by post processing.  CONTRAST:  8mL GADAVIST GADOBUTROL 1 MMOL/ML IV SOLN  COMPARISON:  MRI 10/22/2023.  CT 11/17/2023.  FINDINGS: Lower chest: Trace bilateral pleural fluid. Adjacent opacities as well. Atelectasis is favored.  Hepatobiliary: Liver parenchyma is grossly preserved. No restricted diffusion, enhancing space-occupying mass lesion. No abnormal T2 signal. Portal vein is patent centrally for left portal vein appears slightly atretic.  There is increasing intrahepatic biliary ductal dilatation compared to the previous MRI. This is diffuse. The common duct does have some abrupt tapering and heterogeneous signal this extends to the area of the bowel anastomosis. There is some wall enhancement along the wall of the distal common duct at the level tapering. Please correlate with coronal series  20, image 30. On the MRCP datasets there is some filling defects within the course of the common duct. This could be debris or other process. In addition at the level of the anastomosis the bowel is some ill-defined soft tissue  with spiculations, mixed signal on T2 and progressive enhancement on dynamic study. Favor this being fibrosis. This extends further along the course of the superior mesenteric vessels in the central mesentery. No restricted diffusion along this course. Gallbladder is surgically absent.  Pancreas: Mildly atrophic. Changes from Whipple procedure. No new pancreatic mass.  Spleen: Within normal limits in size and appearance. Small splenule.  Adrenals/Urinary Tract: The adrenal glands are preserved. No enhancing renal mass or collecting system dilatation.  Stomach/Bowel: Visualized bowel in the upper abdomen is nondilated. Scattered colonic stool.  Vascular/Lymphatic: Atherosclerotic changes along the aorta. Normal caliber aorta and IVC. No discrete abnormal lymph node enlargement identified in the visualized abdomen.  Other:  Increasing stranding and trace right upper quadrant ascites.  Musculoskeletal: Diffuse degenerative changes along the spine.  IMPRESSION: Again surgical changes of Whipple procedure for ampullary carcinoma.  There is progressive moderate intrahepatic and extrahepatic biliary ductal dilatation with tapering of the common duct towards the anastomosis. At this location there is some filling defect noted along the course of the distal common duct. This would have a differential including debris. There is also persistent spiculated soft tissue at the anastomotic site. Based on appearance this could be fibrotic tissue or scar. However with the progressive biliary ductal dilatation and the slight enhancement of the wall of the distal common duct recommend further evaluation. PET-CT scan may be of some benefit to further define the nature of this tissue and exclude a malignant component.  No separate space-occupying liver lesion, nodal enlargement.  Increasing right upper quadrant stranding and trace ascites.  Electronically Signed   By: Karen Kays M.D.   On:  11/19/2023 10:20     Scheduled inpatient medications:   Chlorhexidine Gluconate Cloth  6 each Topical Daily   enoxaparin (LOVENOX) injection  40 mg Subcutaneous Q24H   lidocaine-EPINEPHrine  20 mL Intradermal Once   lipase/protease/amylase  12,000 Units Oral TID AC   pantoprazole  40 mg Oral Daily   potassium chloride  10 mEq Oral BID   sodium chloride flush  10-40 mL Intracatheter Q12H   tamsulosin  0.4 mg Oral Daily   Continuous inpatient infusions:   sodium chloride 75 mL/hr at 11/19/23 0731   cefOXitin     ciprofloxacin 200 mL/hr at 11/19/23 9629   metronidazole 500 mg (11/19/23 1008)   PRN inpatient medications: albuterol, HYDROmorphone (DILAUDID) injection, ibuprofen, iohexol, ondansetron **OR** ondansetron (ZOFRAN) IV, oxyCODONE, sodium chloride flush, traZODone  Vital signs in last 24 hours: Temp:  [97.8 F (36.6 C)-99.2 F (37.3 C)] 97.8 F (36.6 C) (02/28 0245) Pulse Rate:  [82-91] 82 (02/28 0245) Resp:  [16-18] 18 (02/28 0245) BP: (104-117)/(63-72) 104/63 (02/28 0245) SpO2:  [96 %-98 %] 96 % (02/28 0245)    Intake/Output Summary (Last 24 hours) at 11/19/2023 1248 Last data filed at 11/19/2023 0937 Gross per 24 hour  Intake 795.47 ml  Output --  Net 795.47 ml    Intake/Output from previous day: No intake/output data recorded. Intake/Output this shift: Total I/O In: 795.5 [I.V.:103.7; IV Piggyback:691.8] Out: -    Physical Exam:  General: Alert male in NAD Heart:  Regular rate .  Pulmonary: Normal respiratory effort Abdomen: Soft, nondistended, nontender. Normal bowel sounds Neurologic: Alert and oriented Psych: Pleasant.  Cooperative.     LOS: 1 day   Willette Cluster ,NP 11/19/2023, 12:48 PM  GI ATTENDING  Interval history data reviewed.  Agree with progress note. Patient has undergone successful percutaneous biliary drainage.  Reviewed.  Continue antibiotics we will give a 7 to 10-day course.  Long-term management of biliary obstruction and  percutaneous drain per interventional radiology.  Oncologic care per oncology team.  Nothing further from a GI medicine perspective.  GI will sign off but are available if needed.  Thanks.  Wilhemina Bonito. Eda Keys., M.D. [facility] Division of Gastroenterology

## 2023-11-19 NOTE — Plan of Care (Signed)

## 2023-11-19 NOTE — Progress Notes (Signed)
 IP PROGRESS NOTE  Subjective:   NW:GNFA developed abdominal pain, nausea, and chills beginning 11/16/2023.  He presented to the emergency room 11/17/2023.  A chemistry panel found the liver enzymes and bilirubin to be more elevated and the white count was elevated.  He was admitted for further evaluation. A CT abdomen/pelvis revealed increased intrahepatic duct dilation.  He reports feeling better. Objective: Vital signs in last 24 hours: Blood pressure 104/63, pulse 82, temperature 97.8 F (36.6 C), temperature source Oral, resp. rate 18, height 5\' 11"  (1.803 m), weight 169 lb 12.8 oz (77 kg), SpO2 96%.  Intake/Output from previous day: No intake/output data recorded.  Physical Exam:  HEENT: Scleral icterus, no thrush Lungs: End inspiratory rales at the lower posterior chest bilaterally, no respiratory distress Cardiac: Regular rate and rhythm Abdomen: Nontender, no mass, no hepatosplenomegaly Extremities: No leg edema Skin: Jaundice  Portacath/PICC-without erythema  Lab Results: Recent Labs    11/18/23 1007 11/19/23 0453  WBC 30.4* 18.3*  HGB 12.8* 12.2*  HCT 37.4* 36.0*  PLT 221 179    BMET Recent Labs    11/18/23 1007 11/19/23 0453  NA 137 133*  K 4.4 4.0  CL 107 103  CO2 23 23  GLUCOSE 130* 111*  BUN 11 13  CREATININE 0.81 0.79  CALCIUM 8.1* 8.2*    Lab Results  Component Value Date   CAN199 12 06/23/2023    Studies/Results: DG Chest Portable 1 View Result Date: 11/17/2023 CLINICAL DATA:  Shortness of breath. Abdominal pain and nausea for 3 hours. Currently on chemotherapy for pancreatic cancer. EXAM: PORTABLE CHEST 1 VIEW COMPARISON:  10/09/2021 FINDINGS: Power port type central venous catheter with tip over the cavoatrial junction region. No pneumothorax. Heart size and pulmonary vascularity are normal. Shallow inspiration. There is infiltration or atelectasis in the left lung base. Right lung is clear. No pleural effusions. Mediastinal contours appear  intact. IMPRESSION: Shallow inspiration with infiltration or atelectasis in the left lung base. Electronically Signed   By: Burman Nieves M.D.   On: 11/17/2023 23:58   CT ABDOMEN PELVIS W CONTRAST Result Date: 11/17/2023 CLINICAL DATA:  Epigastric pain. History of pancreatic cancer with Whipple. EXAM: CT ABDOMEN AND PELVIS WITH CONTRAST TECHNIQUE: Multidetector CT imaging of the abdomen and pelvis was performed using the standard protocol following bolus administration of intravenous contrast. RADIATION DOSE REDUCTION: This exam was performed according to the departmental dose-optimization program which includes automated exposure control, adjustment of the mA and/or kV according to patient size and/or use of iterative reconstruction technique. CONTRAST:  85mL OMNIPAQUE IOHEXOL 300 MG/ML  SOLN COMPARISON:  10/09/2023 FINDINGS: Lower chest: Dependent ground-glass opacities in the lower lobes, likely dependent atelectasis. No effusions. Hepatobiliary: [place] jejunostomy anatomy noted post Whipple procedure. Mild intrahepatic biliary ductal dilatation, increasing since the prior study. Common hepatic duct measures 9 mm in diameter compared to 7 mm previously. Soft tissue in the porta hepatis is stable since prior study. There is slight stranding in the retroperitoneum adjacent to this soft tissue which is also stable and felt to reflect postoperative change. No focal hepatic abnormality. Pancreas: Status post Whipple and partial pancreatectomy. No pancreatic ductal dilatation or focal abnormality. Spleen: No focal abnormality or ductal dilatation. Adrenals/Urinary Tract: No focal abnormality.  Normal size. Stomach/Bowel: Prior partial gastrostomy and gastrojejunostomy. No visible complicating feature. No bowel obstruction or inflammatory process. Sigmoid diverticulosis. No active diverticulitis. Vascular/Lymphatic: No evidence of aneurysm or adenopathy. Aortic atherosclerosis. Reproductive: Mildly prominent  prostate Other: No free fluid or free air.  Musculoskeletal: No acute bony abnormality. IMPRESSION: Increasing intrahepatic biliary ductal dilatation and prominence of the common hepatic duct which terminates within prominent soft tissue in the porta hepatis. This is similar/stable when compared to prior CT and MRI. Given the increasing biliary ductal dilatation, repeat MRI may be helpful to completely exclude residual recurrent mass. Aortic atherosclerosis. Sigmoid diverticulosis. Electronically Signed   By: Charlett Nose M.D.   On: 11/17/2023 21:08    Medications: I have reviewed the patient's current medications.  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,pN1), lymphovascular and perineural invasion present, negative resection margins-closest margins or anterior and posterior peripancreatic soft tissue surfaces at 0.3 cm Elevated CA 19-9 Cycle 1 day 1 gemcitabine/capecitabine 06/23/2023, day 15 gemcitabine 07/06/2023 Cycle 2-day 1 gemcitabine/capecitabine 07/21/2023, day 15 gemcitabine 08/04/2023 Cycle 3-day 1 gemcitabine 08/18/2023 (capecitabine 08/20/2023 dose reduced), day 15 gemcitabine 09/01/2023 Cycle 4-day 1 gemcitabine/capecitabine 09/20/2023, day 15 gemcitabine 10/04/2023 CT abdomen/pelvis 10/09/2023:-No evidence of recurrent pancreatic cancer, mild enhancement of the common hepatic duct leading up to the enteric anastomosis, mild biliary duct dilation in the left hepatic lobe Cycle 5-day 1 gemcitabine/capecitabine 11/01/2023; day 15 Gemcitabine held 11/15/2023 due to elevated liver enzymes, last capecitabine given a.m. 11/17/2023 Duodenal obstruction August 2024 secondary to #1 H. pylori gastritis 05/10/2023-we will confirm treatment plan with Dr. Leone Payor Skin rash at the forearms and posterior neck related to Xeloda Right foot drop-MRI lumbar spine 07/16/2023 with no evidence of metastatic  disease.  L2-L3 mild spinal canal stenosis.  L3-L4 mild to moderate left neural foraminal narrowing.  L5-S1 mild bilateral neural foraminal narrowing.  Narrowing of the left lateral recess at L1-L2. Elevated liver enzymes January 2025 MRI/MRCP 10/22/2023: No evidence of metastatic disease, mild intrahepatic biliary dilation, uniform thickening of the common bile duct with enhancement Liver enzymes normal 10/27/2023 7.  Admission 11/17/2023 with obstructive jaundice/cholangitis CT abdomen/pelvis 11/17/2023: Increased Intermatic biliary duct dilation terminating in prominent soft tissue in the porta hepatis  Mr Fundora has a history of ampullary carcinoma.  He underwent a pancreaticoduodenectomy in August 2024.  He is completing adjuvant gemcitabine/capecitabine chemotherapy.  He last took capecitabine on 11/17/2023.  He is now admitted with obstructive jaundice/cholangitis.  The biliary obstruction appears to be in the common hepatic duct.  Is unclear whether the stricture is benign or malignant.  He is scheduled to undergo placement of a percutaneous biliary drain by IR.  Recommendations: Chemotherapy will remain on hold Management of the biliary obstruction and cholangitis per gastroenterology and the medical service Please call oncology over the weekend as needed, I will check on him 11/22/2023 and outpatient [facility]     LOS: 1 day   Thornton Papas, MD   11/19/2023, 8:28 AM

## 2023-11-20 DIAGNOSIS — K8309 Other cholangitis: Secondary | ICD-10-CM | POA: Diagnosis not present

## 2023-11-20 LAB — CBC WITH DIFFERENTIAL/PLATELET
Abs Immature Granulocytes: 0.05 10*3/uL (ref 0.00–0.07)
Basophils Absolute: 0 10*3/uL (ref 0.0–0.1)
Basophils Relative: 0 %
Eosinophils Absolute: 0.1 10*3/uL (ref 0.0–0.5)
Eosinophils Relative: 1 %
HCT: 37 % — ABNORMAL LOW (ref 39.0–52.0)
Hemoglobin: 12.2 g/dL — ABNORMAL LOW (ref 13.0–17.0)
Immature Granulocytes: 1 %
Lymphocytes Relative: 10 %
Lymphs Abs: 0.9 10*3/uL (ref 0.7–4.0)
MCH: 32.1 pg (ref 26.0–34.0)
MCHC: 33 g/dL (ref 30.0–36.0)
MCV: 97.4 fL (ref 80.0–100.0)
Monocytes Absolute: 0.9 10*3/uL (ref 0.1–1.0)
Monocytes Relative: 10 %
Neutro Abs: 7 10*3/uL (ref 1.7–7.7)
Neutrophils Relative %: 78 %
Platelets: 194 10*3/uL (ref 150–400)
RBC: 3.8 MIL/uL — ABNORMAL LOW (ref 4.22–5.81)
RDW: 16.1 % — ABNORMAL HIGH (ref 11.5–15.5)
WBC: 8.9 10*3/uL (ref 4.0–10.5)
nRBC: 0 % (ref 0.0–0.2)

## 2023-11-20 LAB — COMPREHENSIVE METABOLIC PANEL
ALT: 74 U/L — ABNORMAL HIGH (ref 0–44)
AST: 75 U/L — ABNORMAL HIGH (ref 15–41)
Albumin: 2.3 g/dL — ABNORMAL LOW (ref 3.5–5.0)
Alkaline Phosphatase: 190 U/L — ABNORMAL HIGH (ref 38–126)
Anion gap: 4 — ABNORMAL LOW (ref 5–15)
BUN: 10 mg/dL (ref 8–23)
CO2: 25 mmol/L (ref 22–32)
Calcium: 8.4 mg/dL — ABNORMAL LOW (ref 8.9–10.3)
Chloride: 109 mmol/L (ref 98–111)
Creatinine, Ser: 0.73 mg/dL (ref 0.61–1.24)
GFR, Estimated: >60 mL/min (ref >60)
Glucose, Bld: 101 mg/dL — ABNORMAL HIGH (ref 70–99)
Potassium: 4.1 mmol/L (ref 3.5–5.1)
Sodium: 138 mmol/L (ref 135–145)
Total Bilirubin: 5.5 mg/dL — ABNORMAL HIGH (ref 0.0–1.2)
Total Protein: 5.4 g/dL — ABNORMAL LOW (ref 6.5–8.1)

## 2023-11-20 MED ORDER — BOOST / RESOURCE BREEZE PO LIQD CUSTOM
1.0000 | Freq: Three times a day (TID) | ORAL | Status: DC
Start: 2023-11-20 — End: 2023-11-21
  Administered 2023-11-20 – 2023-11-21 (×3): 1 via ORAL

## 2023-11-20 MED ORDER — BACLOFEN 10 MG PO TABS
5.0000 mg | ORAL_TABLET | Freq: Two times a day (BID) | ORAL | Status: DC | PRN
  Administered 2023-11-20: 5 mg via ORAL
  Filled 2023-11-20: qty 1

## 2023-11-20 NOTE — Plan of Care (Signed)

## 2023-11-20 NOTE — Progress Notes (Signed)
 PROGRESS NOTE  Charles Lawson:[id number] DOB: 1940/12/25 DOA: 11/17/2023 PCP: Corwin Levins, MD  HPI/Recap of past 24 hours: Charles Lawson is a 83 y.o. 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  He is currently under the care of Dr. Truett Perna, receiving gemcitabine/capecitabine and Xeloda. On 11/01/2023 he started cycle 5 of his chemotherapy, he was noted to have recurrent elevation of liver enzymes but remained asymptomatic, until prior to presentation with c/o sudden onset of epigastric abdominal pain, nausea. In the ED, found to have significant elevation in his LFTs as well as leukocytosis.  Started on empiric antibiotics for cholangitis, admitted to the hospitalist service.    Today, patient reports pain with drain flushes.  Eager to advance diet.  Denies any other new complaints.   Assessment/Plan: Principal Problem:   Cholangitis Active Problems:   Epigastric abdominal pain   Malignant neoplasm of pancreas (HCC)   Biliary obstruction   Leukocytosis   Abnormal CT of the abdomen   H/O Whipple procedure   Severe sepsis likely 2/2 possible acute cholangitis  Presented with tachycardia, leukocytosis, initial lactate 2.9 Currently afebrile with resolved leukocytosis LA 2.9-->2.1 Blood cultures X 2 NGTD CT abdomen/pelvis showed increase in intrahepatic biliary ductal dilatation and prominence of common hepatic duct GI consulted, recommend MRCP and percutaneous biliary drain (unable to perform ERCP given his anatomy post Whipple procedure) MRCP showed progressive moderate intrahepatic and extra hepatic biliary ductal dilatation, with some noted filling defects around the distal common duct.  Due to this, PET/CT may be beneficial to exclude a malignant component IR consulted, s/p image guided internal/external  percutaneous biliary drain on 11/19/2023 Continue empiric IV ciprofloxacin and IV Flagyl Pain management Monitor closely, start clear liquid diet  Transaminitis Likely 2/2 above, with underlying chemotherapy Trend CMP  Ampullary carcinoma Currently under the care of Dr. Truett Perna Hold Xeloda and chemo therapy for now Continue Creon   GERD Protonix p.o  Chronic hypokalemia Continue home potassium supplementation Daily CMP    Estimated body mass index is 23.68 kg/m as calculated from the following:   Height as of this encounter: 5\' 11"  (1.803 m).   Weight as of this encounter: 77 kg.     Code Status: Full  Family Communication: Wife at bedside  Disposition Plan: Status is: Inpatient Remains inpatient appropriate because: Level of care     Consultants: GI IR  Procedures: Percutaneous biliary drainage  Antimicrobials: Metronidazole Ciprofloxacin  DVT prophylaxis: Lovenox   Objective: Vitals:   11/19/23 1432 11/19/23 2155 11/20/23 0458 11/20/23 1331  BP: 120/69 111/70 131/79 117/75  Pulse: 69 80 70 84  Resp: 16 18 18 20   Temp: 97.7 F (36.5 C) 98.7 F (37.1 C) 97.8 F (36.6 C) (!) 97.4 F (36.3 C)  TempSrc: Oral Oral Oral Oral  SpO2: 95% 97% 98% 99%  Weight:      Height:        Intake/Output Summary (Last 24 hours) at 11/20/2023 1740 Last data filed at 11/20/2023 1141 Gross per 24 hour  Intake 1930.03 ml  Output 300 ml  Net 1630.03 ml   Filed Weights   11/17/23 1708  Weight: 77 kg    Exam: General: NAD, jaundiced Cardiovascular: S1, S2 present Respiratory: CTAB Abdomen: Soft, tender, nondistended, bowel sounds present, drain noted Musculoskeletal: No bilateral pedal edema noted Skin: Jaundiced Psychiatry: Normal mood  Data Reviewed: CBC: Recent Labs  Lab 11/15/23 1132 11/17/23 1710 11/18/23 1007 11/19/23 0453 11/20/23 0327  WBC 6.8 20.4* 30.4* 18.3* 8.9  NEUTROABS 3.8  --  27.2*  --  7.0  HGB 14.2 13.8 12.8* 12.2* 12.2*   HCT 42.1 41.2 37.4* 36.0* 37.0*  MCV 95.5 96.9 95.7 96.8 97.4  PLT 233 303 221 179 194   Basic Metabolic Panel: Recent Labs  Lab 11/15/23 1132 11/17/23 1710 11/18/23 1007 11/19/23 0453 11/20/23 0327  NA 135 136 137 133* 138  K 4.4 3.8 4.4 4.0 4.1  CL 101 101 107 103 109  CO2 26 26 23 23 25   GLUCOSE 127* 160* 130* 111* 101*  BUN 12 9 11 13 10   CREATININE 0.80 0.92 0.81 0.79 0.73  CALCIUM 9.1 9.1 8.1* 8.2* 8.4*   GFR: Estimated Creatinine Clearance: 78.4 mL/min (by C-G formula based on SCr of 0.73 mg/dL). Liver Function Tests: Recent Labs  Lab 11/15/23 1132 11/17/23 1710 11/18/23 1007 11/19/23 0453 11/20/23 0327  AST 47* 141* 127* 82* 75*  ALT 56* 94* 112* 83* 74*  ALKPHOS 235* 279* 226* 188* 190*  BILITOT 1.9* 3.3* 5.5* 5.3* 5.5*  PROT 7.1 6.6 5.5* 5.3* 5.4*  ALBUMIN 3.9 3.8 3.2* 2.4* 2.3*   Recent Labs  Lab 11/17/23 1710  LIPASE <10*   No results for input(s): "AMMONIA" in the last 168 hours. Coagulation Profile: Recent Labs  Lab 11/19/23 1032  INR 1.5*   Cardiac Enzymes: No results for input(s): "CKTOTAL", "CKMB", "CKMBINDEX", "TROPONINI" in the last 168 hours. BNP (last 3 results) No results for input(s): "PROBNP" in the last 8760 hours. HbA1C: No results for input(s): "HGBA1C" in the last 72 hours. CBG: No results for input(s): "GLUCAP" in the last 168 hours. Lipid Profile: No results for input(s): "CHOL", "HDL", "LDLCALC", "TRIG", "CHOLHDL", "LDLDIRECT" in the last 72 hours. Thyroid Function Tests: No results for input(s): "TSH", "T4TOTAL", "FREET4", "T3FREE", "THYROIDAB" in the last 72 hours. Anemia Panel: No results for input(s): "VITAMINB12", "FOLATE", "FERRITIN", "TIBC", "IRON", "RETICCTPCT" in the last 72 hours. Urine analysis:    Component Value Date/Time   COLORURINE YELLOW 11/17/2023 1710   APPEARANCEUR CLEAR 11/17/2023 1710   LABSPEC 1.018 11/17/2023 1710   PHURINE 5.5 11/17/2023 1710   GLUCOSEU NEGATIVE 11/17/2023 1710   GLUCOSEU  NEGATIVE 06/24/2022 1103   HGBUR NEGATIVE 11/17/2023 1710   HGBUR negative 01/30/2010 0815   BILIRUBINUR SMALL (A) 11/17/2023 1710   KETONESUR NEGATIVE 11/17/2023 1710   PROTEINUR NEGATIVE 11/17/2023 1710   UROBILINOGEN 0.2 06/24/2022 1103   NITRITE NEGATIVE 11/17/2023 1710   LEUKOCYTESUR NEGATIVE 11/17/2023 1710   Sepsis Labs: @LABRCNTIP (procalcitonin:4,lacticidven:4)  ) Recent Results (from the past 240 hours)  Resp panel by RT-PCR (RSV, Flu A&B, Covid) Anterior Nasal Swab     Status: None   Collection Time: 11/17/23  5:10 PM   Specimen: Anterior Nasal Swab  Result Value Ref Range Status   SARS Coronavirus 2 by RT PCR NEGATIVE NEGATIVE Final    Comment: (NOTE) SARS-CoV-2 target nucleic acids are NOT DETECTED.  The SARS-CoV-2 RNA is generally detectable in upper respiratory specimens during the acute phase of infection. The lowest concentration of SARS-CoV-2 viral copies this assay can detect is 138 copies/mL. A negative result does not preclude SARS-Cov-2 infection and should not be used as the sole basis for treatment or other patient management decisions. A negative result may occur with  improper specimen collection/handling, submission of specimen other than nasopharyngeal swab, presence of viral mutation(s) within the  areas targeted by this assay, and inadequate number of viral copies(<138 copies/mL). A negative result must be combined with clinical observations, patient history, and epidemiological information. The expected result is Negative.  Fact Sheet for Patients:  BloggerCourse.com  Fact Sheet for Healthcare Providers:  SeriousBroker.it  This test is no t yet approved or cleared by the [place] FDA and  has been authorized for detection and/or diagnosis of SARS-CoV-2 by FDA under an Emergency Use Authorization (EUA). This EUA will remain  in effect (meaning this test can be used) for the duration of  the COVID-19 declaration under Section 564(b)(1) of the Act, 21 U.S.C.section 360bbb-3(b)(1), unless the authorization is terminated  or revoked sooner.       Influenza A by PCR NEGATIVE NEGATIVE Final   Influenza B by PCR NEGATIVE NEGATIVE Final    Comment: (NOTE) The Xpert Xpress SARS-CoV-2/FLU/RSV plus assay is intended as an aid in the diagnosis of influenza from Nasopharyngeal swab specimens and should not be used as a sole basis for treatment. Nasal washings and aspirates are unacceptable for Xpert Xpress SARS-CoV-2/FLU/RSV testing.  Fact Sheet for Patients: BloggerCourse.com  Fact Sheet for Healthcare Providers: SeriousBroker.it  This test is not yet approved or cleared by the [place] FDA and has been authorized for detection and/or diagnosis of SARS-CoV-2 by FDA under an Emergency Use Authorization (EUA). This EUA will remain in effect (meaning this test can be used) for the duration of the COVID-19 declaration under Section 564(b)(1) of the Act, 21 U.S.C. section 360bbb-3(b)(1), unless the authorization is terminated or revoked.     Resp Syncytial Virus by PCR NEGATIVE NEGATIVE Final    Comment: (NOTE) Fact Sheet for Patients: BloggerCourse.com  Fact Sheet for Healthcare Providers: SeriousBroker.it  This test is not yet approved or cleared by the [place] FDA and has been authorized for detection and/or diagnosis of SARS-CoV-2 by FDA under an Emergency Use Authorization (EUA). This EUA will remain in effect (meaning this test can be used) for the duration of the COVID-19 declaration under Section 564(b)(1) of the Act, 21 U.S.C. section 360bbb-3(b)(1), unless the authorization is terminated or revoked.  Performed at [facility], [address], [place], [place] [address]   Blood culture (routine x 2)     Status: None (Preliminary  result)   Collection Time: 11/17/23  9:55 PM   Specimen: BLOOD RIGHT ARM  Result Value Ref Range Status   Specimen Description   Final    BLOOD RIGHT ARM Performed at [facility], [address] [place], [place] [address]    Special Requests   Final    BOTTLES DRAWN AEROBIC AND ANAEROBIC Blood Culture results may not be optimal due to an inadequate volume of blood received in culture bottles Performed at [facility], [address] [place], [place] [address]    Culture   Final    NO GROWTH 2 DAYS Performed at [facility], [address], [place], [place] [address]    Report Status PENDING  Incomplete  Blood culture (routine x 2)     Status: None (Preliminary result)   Collection Time: 11/17/23 10:06 PM   Specimen: BLOOD RIGHT HAND  Result Value Ref Range Status   Specimen Description   Final    BLOOD RIGHT HAND Performed at [facility], [address] [place], [place] [address]    Special Requests   Final    BOTTLES DRAWN AEROBIC AND ANAEROBIC Blood Culture results may not  be optimal due to an inadequate volume of blood received in culture bottles Performed at [facility], [address], [place], [place] [address]    Culture   Final    NO GROWTH 2 DAYS Performed at [facility], [address] [place], [place] [address]    Report Status PENDING  Incomplete      Studies: No results found.   Scheduled Meds:  Chlorhexidine Gluconate Cloth  6 each Topical Daily   enoxaparin (LOVENOX) injection  40 mg Subcutaneous Q24H   feeding supplement  1 Container Oral TID BM   lipase/protease/amylase  12,000 Units Oral TID AC   pantoprazole  40 mg Oral Daily   potassium chloride  10 mEq Oral BID   sodium chloride flush  10 mL Intracatheter Q8H   sodium chloride flush  10-40 mL Intracatheter Q12H   tamsulosin  0.4 mg Oral Daily    Continuous Infusions:  ciprofloxacin 400 mg  (11/20/23 0865)   metronidazole 500 mg (11/20/23 1017)     LOS: 2 days     Briant Cedar, MD [facility]  If 7PM-7AM, please contact night-coverage [email or web address] 11/20/2023, 5:40 PM

## 2023-11-20 NOTE — Progress Notes (Signed)
 Referring Physician(s): Willette Cluster NP  Supervising Physician: Marliss Coots  Patient Status:  [facility] - In-pt  Chief Complaint:  Intrahepatic biliary ductal dilation  with biliary obstruction, cholangitits s/p internal external biliary drain placement on 2.28.25 with IR Attendin Dr. Mosie Epstein  Subjective:  Pt sitting up in chair. Eating clears diet. No N/v Some soreness at drain site as expected. Family at bedside  Allergies: Penicillins  Medications:  Current Facility-Administered Medications:    albuterol (PROVENTIL) (2.5 MG/3ML) 0.083% nebulizer solution 2.5 mg, 2.5 mg, Nebulization, Q2H PRN, Kirby Crigler, Mir M, MD   Chlorhexidine Gluconate Cloth 2 % PADS 6 each, 6 each, Topical, Daily, Kirby Crigler, Mir M, MD, 6 each at 11/20/23 1610   ciprofloxacin (CIPRO) IVPB 400 mg, 400 mg, Intravenous, Q12H, Franne Forts, DO, Last Rate: 200 mL/hr at 11/20/23 9604, 400 mg at 11/20/23 5409   enoxaparin (LOVENOX) injection 40 mg, 40 mg, Subcutaneous, Q24H, Kirby Crigler, Mir M, MD, 40 mg at 11/19/23 1650   feeding supplement (BOOST / RESOURCE BREEZE) liquid 1 Container, 1 Container, Oral, TID BM, Briant Cedar, MD, 1 Container at 11/20/23 0820   HYDROmorphone (DILAUDID) injection 0.5-1 mg, 0.5-1 mg, Intravenous, Q2H PRN, Kirby Crigler, Mir M, MD   ibuprofen (ADVIL) tablet 400 mg, 400 mg, Oral, Q6H PRN, Kirby Crigler, Mir M, MD, 400 mg at 11/19/23 2126   lipase/protease/amylase (CREON) capsule 12,000 Units, 12,000 Units, Oral, TID Ceasar Mons, Mir M, MD, 12,000 Units at 11/20/23 8119   metroNIDAZOLE (FLAGYL) IVPB 500 mg, 500 mg, Intravenous, Q12H, Franne Forts, DO, Stopped at 11/19/23 2342   ondansetron (ZOFRAN) tablet 4 mg, 4 mg, Oral, Q6H PRN **OR** ondansetron (ZOFRAN) injection 4 mg, 4 mg, Intravenous, Q6H PRN, Kirby Crigler, Mir M, MD   oxyCODONE (Oxy IR/ROXICODONE) immediate release tablet 5 mg, 5 mg, Oral, Q4H PRN, Kirby Crigler, Mir M, MD   pantoprazole (PROTONIX) EC tablet 40 mg, 40  mg, Oral, Daily, Kirby Crigler, Mir M, MD, 40 mg at 11/20/23 0820   potassium chloride (KLOR-CON M) CR tablet 10 mEq, 10 mEq, Oral, BID, Kirby Crigler, Mir M, MD, 10 mEq at 11/20/23 0819   sodium chloride flush (NS) 0.9 % injection 10 mL, 10 mL, Intracatheter, Q8H, Wagner, Jaime, DO, 10 mL at 11/20/23 0515   sodium chloride flush (NS) 0.9 % injection 10-40 mL, 10-40 mL, Intracatheter, Q12H, Kirby Crigler, Mir M, MD, 10 mL at 11/20/23 0820   sodium chloride flush (NS) 0.9 % injection 10-40 mL, 10-40 mL, Intracatheter, PRN, Kirby Crigler, Mir M, MD   tamsulosin [facility]) capsule 0.4 mg, 0.4 mg, Oral, Daily, Kirby Crigler, Mir M, MD, 0.4 mg at 11/20/23 0820   traZODone (DESYREL) tablet 50 mg, 50 mg, Oral, QHS PRN, Kirby Crigler, Mir M, MD, 50 mg at 11/19/23 2125  Facility-Administered Medications Ordered in Other Encounters:    sodium chloride flush (NS) 0.9 % injection 10 mL, 10 mL, Intravenous, PRN, Ladene Artist, MD, 10 mL at 10/18/23 1310    Vital Signs: BP 131/79 (BP Location: Right Arm)   Pulse 70   Temp 97.8 F (36.6 C) (Oral)   Resp 18   Ht 5\' 11"  (1.803 m)   Wt 169 lb 12.8 oz (77 kg)   SpO2 98%   BMI 23.68 kg/m   Physical Exam Constitutional:      Appearance: He is not ill-appearing.  Cardiovascular:     Rate and Rhythm: Normal rate and regular rhythm.  Abdominal:     General: There is no distension.     Palpations: Abdomen is  soft.     Tenderness: There is no abdominal tenderness.     Comments: Drain intact, site clean, bilious output in bag  Skin:    Coloration: Skin is jaundiced.  Neurological:     Mental Status: He is alert.     Imaging: IR INT EXT BILIARY DRAIN WITH CHOLANGIOGRAM Result Date: 11/19/2023 INDICATION: 83 year old male with pancreatic head mass, referred for internal/external biliary drain EXAM: IMAGE GUIDED PERCUTANEOUS TRANSHEPATIC CHOLANGIOGRAM WITH INTERNAL/EXTERNAL BILIARY DRAINAGE MEDICATIONS: 25 mg IV Benadryl ANESTHESIA/SEDATION: Moderate (conscious)  sedation was employed during this procedure. A total of Versed 3.0 mg and Fentanyl 100 mcg was administered intravenously by the radiology nurse. Total intra-service moderate Sedation Time: 20 minutes. The patient's level of consciousness and vital signs were monitored continuously by radiology nursing throughout the procedure under my direct supervision. FLUOROSCOPY: Radiation Exposure Index (as provided by the fluoroscopic device): 157 mGy Kerma COMPLICATIONS: None PROCEDURE: The procedure, risks, benefits, and alternatives were explained to the patient and the patient's family. A complete informed consent was performed, with risk benefit analysis. Specific risks that were discussed for the procedure include bleeding, infection, biliary sepsis, IC use day, organ injury, need for further procedure, need for further surgery, long-term drain placement, cardiopulmonary collapse, death. Questions regarding the procedure were encouraged and answered. The patient understands and consents to the procedure. Patient is position in supine position on the fluoroscopy table, and the upper abdomen was prepped and draped in the usual sterile fashion. Maximum barrier sterile technique with sterile gowns and gloves were used for the procedure. A timeout was performed prior to the initiation of the procedure. Local anesthesia was provided with 1% lidocaine with epinephrine. Ultrasound survey of the right liver lobe was performed. 1% lidocaine was used for local anesthesia, with generous infiltration of the skin and subcutaneous tissues in and inter left costal location. A Chiba needle was advanced under ultrasound guidance into the right liver lobe. Three attempts with ultrasound were performed, each with the needle terminating in venous structure. We then performed a formal PTC attempt. Under fluoroscopic guidance the Chiba needle was advanced into the liver from mid axillary line at the level of the 10-11th rib. Stylet was  removed and the needle was withdrawn slowly. On the first attempt only blood return. On the second attempt bile was identified. Contrast was injected identifying the tip in a reasonable right-sided biliary ductal radical. Once the tip of the needle was confirmed within the biliary system by injecting small aliquots of contrast, images were stored of the biliary system after partially opacifying the biliary tree via the needle. An 018 wire was then advanced centrally. The needle was removed, a small incision was made with an 11 blade scalpel, and then a triaxial Accustick system was advanced into the biliary system. The metal stiffener and dilator were removed, we confirmed placement with contrast infusion. A guidewire were did not navigate well through the outer 4 [place] sheath. A coon's wire was then placed in the biliary system in the 4 French sheath was removed. An angled Kumpe the catheter was then advanced on to the coon's wire which was manipulated then with the Kumpe the wire into the common bile duct. The coon's wire and the Kumpe the catheter were then advanced into the small bowel. Dilation of the subcutaneous tissue tracks was performed with an 10 [place] dilator, and then a 12 [place] dilator. A 12 French internal/external biliary drain was then placed . Small amount of contrast confirmed location. The patient  tolerated the procedure well and remained hemodynamically stable throughout. No complications were encountered and no significant blood loss was encountered. IMPRESSION: Status post image guided percutaneous transhepatic cholangiogram with internal/external biliary drain placement. Signed, Yvone Neu. Miachel Roux, RPVI [facility] Radiology Electronically Signed   By: Gilmer Mor D.O.   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 Worsening biliary ductal dilatation. EXAM: MRI ABDOMEN WITHOUT AND WITH CONTRAST (INCLUDING MRCP) TECHNIQUE: Multiplanar multisequence MR imaging of the abdomen was performed both before and after the administration of intravenous contrast. Heavily T2-weighted images of the biliary and pancreatic ducts were obtained, and three-dimensional MRCP images were rendered by post processing. CONTRAST:  8mL GADAVIST GADOBUTROL 1 MMOL/ML IV SOLN COMPARISON:  MRI 10/22/2023.  CT 11/17/2023. FINDINGS: Lower chest: Trace bilateral pleural fluid. Adjacent opacities as well. Atelectasis is favored. Hepatobiliary: Liver parenchyma is grossly preserved. No restricted diffusion, enhancing space-occupying mass lesion. No abnormal T2 signal. Portal vein is patent centrally for left portal vein appears slightly atretic. There is increasing intrahepatic biliary ductal dilatation compared to the previous MRI. This is diffuse. The common duct does have some abrupt tapering and heterogeneous signal this extends to the area of the bowel anastomosis. There is some wall enhancement along the wall of the distal common duct at the level tapering. Please correlate with coronal series 20, image 30. On the MRCP datasets there is some filling defects within the course of the common duct. This could be debris or other process. In addition at the level of the anastomosis the bowel is some ill-defined soft tissue with spiculations, mixed signal on T2 and progressive enhancement on dynamic study. Favor this being fibrosis. This extends further along the course of the superior mesenteric vessels in the central mesentery. No restricted diffusion along this course. Gallbladder is surgically absent. Pancreas: Mildly atrophic. Changes from Whipple procedure. No new pancreatic mass. Spleen: Within normal limits in size and appearance. Small splenule. Adrenals/Urinary Tract: The adrenal glands are preserved. No enhancing renal mass or collecting system  dilatation. Stomach/Bowel: Visualized bowel in the upper abdomen is nondilated. Scattered colonic stool. Vascular/Lymphatic: Atherosclerotic changes along the aorta. Normal caliber aorta and IVC. No discrete abnormal lymph node enlargement identified in the visualized abdomen. Other:  Increasing stranding and trace right upper quadrant ascites. Musculoskeletal: Diffuse degenerative changes along the spine. IMPRESSION: Again surgical changes of Whipple procedure for ampullary carcinoma. There is progressive moderate intrahepatic and extrahepatic biliary ductal dilatation with tapering of the common duct towards the anastomosis. At this location there is some filling defect noted along the course of the distal common duct. This would have a differential including debris. There is also persistent spiculated soft tissue at the anastomotic site. Based on appearance this could be fibrotic tissue or scar. However with the progressive biliary ductal dilatation and the slight enhancement of the wall of the distal common duct recommend further evaluation. PET-CT scan may be of some benefit to further define the nature of this tissue and exclude a malignant component. No separate space-occupying liver lesion, nodal enlargement. Increasing right upper quadrant stranding and trace ascites. Electronically Signed   By: Karen Kays M.D.   On: 11/19/2023 10:20   MR 3D Recon At Scanner Result Date: 11/19/2023 CLINICAL DATA:  History of ampullary carcinoma with prior Whipple procedure in August 2024. Worsening biliary ductal dilatation. EXAM: MRI  ABDOMEN WITHOUT AND WITH CONTRAST (INCLUDING MRCP) TECHNIQUE: Multiplanar multisequence MR imaging of the abdomen was performed both before and after the administration of intravenous contrast. Heavily T2-weighted images of the biliary and pancreatic ducts were obtained, and three-dimensional MRCP images were rendered by post processing. CONTRAST:  8mL GADAVIST GADOBUTROL 1 MMOL/ML IV  SOLN COMPARISON:  MRI 10/22/2023.  CT 11/17/2023. FINDINGS: Lower chest: Trace bilateral pleural fluid. Adjacent opacities as well. Atelectasis is favored. Hepatobiliary: Liver parenchyma is grossly preserved. No restricted diffusion, enhancing space-occupying mass lesion. No abnormal T2 signal. Portal vein is patent centrally for left portal vein appears slightly atretic. There is increasing intrahepatic biliary ductal dilatation compared to the previous MRI. This is diffuse. The common duct does have some abrupt tapering and heterogeneous signal this extends to the area of the bowel anastomosis. There is some wall enhancement along the wall of the distal common duct at the level tapering. Please correlate with coronal series 20, image 30. On the MRCP datasets there is some filling defects within the course of the common duct. This could be debris or other process. In addition at the level of the anastomosis the bowel is some ill-defined soft tissue with spiculations, mixed signal on T2 and progressive enhancement on dynamic study. Favor this being fibrosis. This extends further along the course of the superior mesenteric vessels in the central mesentery. No restricted diffusion along this course. Gallbladder is surgically absent. Pancreas: Mildly atrophic. Changes from Whipple procedure. No new pancreatic mass. Spleen: Within normal limits in size and appearance. Small splenule. Adrenals/Urinary Tract: The adrenal glands are preserved. No enhancing renal mass or collecting system dilatation. Stomach/Bowel: Visualized bowel in the upper abdomen is nondilated. Scattered colonic stool. Vascular/Lymphatic: Atherosclerotic changes along the aorta. Normal caliber aorta and IVC. No discrete abnormal lymph node enlargement identified in the visualized abdomen. Other:  Increasing stranding and trace right upper quadrant ascites. Musculoskeletal: Diffuse degenerative changes along the spine. IMPRESSION: Again surgical  changes of Whipple procedure for ampullary carcinoma. There is progressive moderate intrahepatic and extrahepatic biliary ductal dilatation with tapering of the common duct towards the anastomosis. At this location there is some filling defect noted along the course of the distal common duct. This would have a differential including debris. There is also persistent spiculated soft tissue at the anastomotic site. Based on appearance this could be fibrotic tissue or scar. However with the progressive biliary ductal dilatation and the slight enhancement of the wall of the distal common duct recommend further evaluation. PET-CT scan may be of some benefit to further define the nature of this tissue and exclude a malignant component. No separate space-occupying liver lesion, nodal enlargement. Increasing right upper quadrant stranding and trace ascites. Electronically Signed   By: Karen Kays M.D.   On: 11/19/2023 10:20   DG Chest Portable 1 View Result Date: 11/17/2023 CLINICAL DATA:  Shortness of breath. Abdominal pain and nausea for 3 hours. Currently on chemotherapy for pancreatic cancer. EXAM: PORTABLE CHEST 1 VIEW COMPARISON:  10/09/2021 FINDINGS: Power port type central venous catheter with tip over the cavoatrial junction region. No pneumothorax. Heart size and pulmonary vascularity are normal. Shallow inspiration. There is infiltration or atelectasis in the left lung base. Right lung is clear. No pleural effusions. Mediastinal contours appear intact. IMPRESSION: Shallow inspiration with infiltration or atelectasis in the left lung base. Electronically Signed   By: Burman Nieves M.D.   On: 11/17/2023 23:58   CT ABDOMEN PELVIS W CONTRAST Result Date: 11/17/2023 CLINICAL DATA:  Epigastric pain. History of pancreatic cancer with Whipple. EXAM: CT ABDOMEN AND PELVIS WITH CONTRAST TECHNIQUE: Multidetector CT imaging of the abdomen and pelvis was performed using the standard protocol following bolus  administration of intravenous contrast. RADIATION DOSE REDUCTION: This exam was performed according to the departmental dose-optimization program which includes automated exposure control, adjustment of the mA and/or kV according to patient size and/or use of iterative reconstruction technique. CONTRAST:  85mL OMNIPAQUE IOHEXOL 300 MG/ML  SOLN COMPARISON:  10/09/2023 FINDINGS: Lower chest: Dependent ground-glass opacities in the lower lobes, likely dependent atelectasis. No effusions. Hepatobiliary: Choledocho jejunostomy anatomy noted post Whipple procedure. Mild intrahepatic biliary ductal dilatation, increasing since the prior study. Common hepatic duct measures 9 mm in diameter compared to 7 mm previously. Soft tissue in the porta hepatis is stable since prior study. There is slight stranding in the retroperitoneum adjacent to this soft tissue which is also stable and felt to reflect postoperative change. No focal hepatic abnormality. Pancreas: Status post Whipple and partial pancreatectomy. No pancreatic ductal dilatation or focal abnormality. Spleen: No focal abnormality or ductal dilatation. Adrenals/Urinary Tract: No focal abnormality.  Normal size. Stomach/Bowel: Prior partial gastrostomy and gastrojejunostomy. No visible complicating feature. No bowel obstruction or inflammatory process. Sigmoid diverticulosis. No active diverticulitis. Vascular/Lymphatic: No evidence of aneurysm or adenopathy. Aortic atherosclerosis. Reproductive: Mildly prominent prostate Other: No free fluid or free air. Musculoskeletal: No acute bony abnormality. IMPRESSION: Increasing intrahepatic biliary ductal dilatation and prominence of the common hepatic duct which terminates within prominent soft tissue in the porta hepatis. This is similar/stable when compared to prior CT and MRI. Given the increasing biliary ductal dilatation, repeat MRI may be helpful to completely exclude residual recurrent mass. Aortic atherosclerosis.  Sigmoid diverticulosis. Electronically Signed   By: Charlett Nose M.D.   On: 11/17/2023 21:08    Labs:  CBC: Recent Labs    11/17/23 1710 11/18/23 1007 11/19/23 0453 11/20/23 0327  WBC 20.4* 30.4* 18.3* 8.9  HGB 13.8 12.8* 12.2* 12.2*  HCT 41.2 37.4* 36.0* 37.0*  PLT 303 221 179 194    COAGS: Recent Labs    11/19/23 1032  INR 1.5*    BMP: Recent Labs    11/17/23 1710 11/18/23 1007 11/19/23 0453 11/20/23 0327  NA 136 137 133* 138  K 3.8 4.4 4.0 4.1  CL 101 107 103 109  CO2 26 23 23 25   GLUCOSE 160* 130* 111* 101*  BUN 9 11 13 10   CALCIUM 9.1 8.1* 8.2* 8.4*  CREATININE 0.92 0.81 0.79 0.73  GFRNONAA >60 >60 >60 >60    LIVER FUNCTION TESTS: Recent Labs    11/17/23 1710 11/18/23 1007 11/19/23 0453 11/20/23 0327  BILITOT 3.3* 5.5* 5.3* 5.5*  AST 141* 127* 82* 75*  ALT 94* 112* 83* 74*  ALKPHOS 279* 226* 188* 190*  PROT 6.6 5.5* 5.3* 5.4*  ALBUMIN 3.8 3.2* 2.4* 2.3*    Assessment and Plan:  83 y.o. male inpatient. 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 Currently undergoing chemotherapy. Patient presented on 2.2625 with abdominal pain, nausea and chills. Found to have intrahepatic biliary ductal dilation  with biliary obstruction, cholangitits and leukocystosis. MRCP performed on 2.27.25. Patient is not a candidate for ERCP due to post Whipple anatomy. On 2.28.24. IR placed an internal external 12 Fr. biliary drain placement.  WBC down to normal. T.BIli still ~5.4  Drain Location: RUQ Size: Fr size: 12 Fr Date of placement: 2.28.25  Currently to: Drain collection device: gravity 24 hour output:  Output by Drain (mL) 11/18/23 0700 - 11/18/23 1459 11/18/23 1500 - 11/18/23 2259 11/18/23 2300 - 11/19/23 0659 11/19/23 0700 - 11/19/23 1459 11/19/23 1500 - 11/19/23 2259 11/19/23 2300 - 11/20/23 0659 11/20/23 0700 - 11/20/23 2952  Biliary Tube VTCB biliary 12 Fr. RUQ     200      Interval imaging/drain manipulation:  None since drain  placement  Current examination: Flushes/aspirates easily.  Insertion site unremarkable. Suture and stat lock in place. Dressed appropriately.   Plan: Continue TID flushes with 5 cc NS. Record output Q shift. Dressing changes QD or PRN if soiled.  Call IR APP or on call IR MD if difficulty flushing or sudden change in drain output.   Discharge planning: Please contact IR APP or on call IR MD prior to patient d/c to ensure appropriate follow up plans are in place. Typically patient will follow up 6-8 week for drain exchange. Once daily with 5 ml sterile normal saline as outpatient; will schedule f/u cholangiogram/ drain/ exchange in 6-8 weeks. IR scheduler will contact patient with date/time of appointment. Patient will need to flush drain QD with 5 cc NS, record output QD, dressing changes every 2-3 days or earlier if soiled.   IR will continue to follow - please call with questions or concerns.     Electronically Signed: Brayton El PA-C Interventional Radiology 11/20/2023 9:05 AM   I spent a total of 15 Minutes at the the patient's bedside AND on the patient's hospital floor or unit, greater than 50% of which was counseling/coordinating care for internal external biliary drain placement

## 2023-11-21 ENCOUNTER — Other Ambulatory Visit: Payer: Self-pay | Admitting: Radiology

## 2023-11-21 DIAGNOSIS — K831 Obstruction of bile duct: Secondary | ICD-10-CM

## 2023-11-21 DIAGNOSIS — K8309 Other cholangitis: Secondary | ICD-10-CM | POA: Diagnosis not present

## 2023-11-21 LAB — COMPREHENSIVE METABOLIC PANEL
ALT: 62 U/L — ABNORMAL HIGH (ref 0–44)
AST: 62 U/L — ABNORMAL HIGH (ref 15–41)
Albumin: 2.4 g/dL — ABNORMAL LOW (ref 3.5–5.0)
Alkaline Phosphatase: 195 U/L — ABNORMAL HIGH (ref 38–126)
Anion gap: 6 (ref 5–15)
BUN: 7 mg/dL — ABNORMAL LOW (ref 8–23)
CO2: 23 mmol/L (ref 22–32)
Calcium: 8.2 mg/dL — ABNORMAL LOW (ref 8.9–10.3)
Chloride: 108 mmol/L (ref 98–111)
Creatinine, Ser: 0.66 mg/dL (ref 0.61–1.24)
GFR, Estimated: >60 mL/min (ref >60)
Glucose, Bld: 103 mg/dL — ABNORMAL HIGH (ref 70–99)
Potassium: 3.9 mmol/L (ref 3.5–5.1)
Sodium: 137 mmol/L (ref 135–145)
Total Bilirubin: 6 mg/dL — ABNORMAL HIGH (ref 0.0–1.2)
Total Protein: 5.3 g/dL — ABNORMAL LOW (ref 6.5–8.1)

## 2023-11-21 LAB — CBC WITH DIFFERENTIAL/PLATELET
Abs Immature Granulocytes: 0.03 10*3/uL (ref 0.00–0.07)
Basophils Absolute: 0 10*3/uL (ref 0.0–0.1)
Basophils Relative: 0 %
Eosinophils Absolute: 0.1 10*3/uL (ref 0.0–0.5)
Eosinophils Relative: 1 %
HCT: 37.1 % — ABNORMAL LOW (ref 39.0–52.0)
Hemoglobin: 12.4 g/dL — ABNORMAL LOW (ref 13.0–17.0)
Immature Granulocytes: 0 %
Lymphocytes Relative: 19 %
Lymphs Abs: 1.4 10*3/uL (ref 0.7–4.0)
MCH: 32.4 pg (ref 26.0–34.0)
MCHC: 33.4 g/dL (ref 30.0–36.0)
MCV: 96.9 fL (ref 80.0–100.0)
Monocytes Absolute: 0.9 10*3/uL (ref 0.1–1.0)
Monocytes Relative: 13 %
Neutro Abs: 4.7 10*3/uL (ref 1.7–7.7)
Neutrophils Relative %: 67 %
Platelets: 207 10*3/uL (ref 150–400)
RBC: 3.83 MIL/uL — ABNORMAL LOW (ref 4.22–5.81)
RDW: 15.9 % — ABNORMAL HIGH (ref 11.5–15.5)
WBC: 7.1 10*3/uL (ref 4.0–10.5)
nRBC: 0 % (ref 0.0–0.2)

## 2023-11-21 MED ORDER — HEPARIN SOD (PORK) LOCK FLUSH 100 UNIT/ML IV SOLN
INTRAVENOUS | Status: AC
Start: 2023-11-21 — End: 2023-11-21
  Administered 2023-11-21: 500 [IU]
  Filled 2023-11-21: qty 5

## 2023-11-21 MED ORDER — BACLOFEN 5 MG PO TABS: 5.0000 mg | ORAL_TABLET | Freq: Two times a day (BID) | ORAL | 0 refills | Status: AC | PRN

## 2023-11-21 MED ORDER — CIPROFLOXACIN HCL 500 MG PO TABS: 500.0000 mg | ORAL_TABLET | Freq: Two times a day (BID) | ORAL | 0 refills | Status: AC

## 2023-11-21 MED ORDER — METRONIDAZOLE 500 MG PO TABS
500.0000 mg | ORAL_TABLET | Freq: Two times a day (BID) | ORAL | 0 refills | Status: AC
Start: 2023-11-21 — End: 2023-11-27

## 2023-11-21 MED ORDER — HEPARIN SOD (PORK) LOCK FLUSH 100 UNIT/ML IV SOLN: 500.0000 [IU] | INTRAVENOUS | Status: AC | PRN

## 2023-11-21 MED ORDER — OXYCODONE HCL 5 MG PO TABS
5.0000 mg | ORAL_TABLET | Freq: Four times a day (QID) | ORAL | 0 refills | Status: AC | PRN
Start: 2023-11-21 — End: 2023-11-24

## 2023-11-21 NOTE — Discharge Summary (Signed)
 Physician Discharge Summary   Patient: Charles Lawson MRN: [id number] DOB: 07/12/1941  Admit date:     11/17/2023  Discharge date: 11/21/23  Discharge Physician: Briant Cedar   PCP: Corwin Levins, MD   Recommendations at discharge:   Follow-up with PCP Follow-up with IR Follow-up with GI  Discharge Diagnoses: Principal Problem:   Cholangitis Active Problems:   Epigastric abdominal pain   Malignant neoplasm of pancreas (HCC)   Biliary obstruction   Leukocytosis   Abnormal CT of the abdomen   H/O Whipple procedure   Hospital Course: Charles Lawson is a 83 y.o. 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  He is currently under the care of Dr. Truett Perna, receiving gemcitabine/capecitabine and Xeloda. On 11/01/2023 he started cycle 5 of his chemotherapy, he was noted to have recurrent elevation of liver enzymes but remained asymptomatic, until prior to presentation with c/o sudden onset of epigastric abdominal pain, nausea. In the ED, found to have significant elevation in his LFTs as well as leukocytosis.  Started on empiric antibiotics for cholangitis, admitted to the hospitalist service.    Today, patient denies any new complaints.  Tolerating diet, denies any abdominal pain nausea/vomiting, fever/chills.  Wife was educated on how to care for the drain including flushes as well as dressing changes.  IR will follow-up patient.   Assessment and Plan:  Severe sepsis likely 2/2 possible acute cholangitis  Presented with tachycardia, leukocytosis, initial lactate 2.9 Currently afebrile with resolved leukocytosis LA 2.9-->2.1 Blood cultures X 2 NGTD CT abdomen/pelvis showed increase in intrahepatic biliary ductal dilatation and prominence of common hepatic duct GI consulted, recommend MRCP and percutaneous biliary drain  (unable to perform ERCP given his anatomy post Whipple procedure) MRCP showed progressive moderate intrahepatic and extra hepatic biliary ductal dilatation, with some noted filling defects around the distal common duct.  Due to this, PET/CT may be beneficial to exclude a malignant component IR consulted, s/p image guided internal/external percutaneous biliary drain on 11/19/2023 Continue ciprofloxacin and Flagyl for a total of 10 days as per GI Follow up with IR for drain care   Transaminitis Likely 2/2 above, with underlying chemotherapy   Ampullary carcinoma Currently under the care of Dr. Truett Perna Hold Xeloda and chemo therapy for now Continue Creon Outpatient follow up   GERD Protonix p.o   Chronic hypokalemia Continue home potassium supplementation       Pain control - [facility] Controlled Substance Reporting System database was reviewed. and patient was instructed, not to drive, operate heavy machinery, perform activities at heights, swimming or participation in water activities or provide baby-sitting services while on Pain, Sleep and Anxiety Medications; until their outpatient Physician has advised to do so again. Also recommended to not to take more than prescribed Pain, Sleep and Anxiety Medications.   Consultants: GI, IR Procedures performed: Biliary drain placement Disposition: Home Diet recommendation:  Regular diet DISCHARGE MEDICATION: Allergies as of 11/21/2023       Reactions   Penicillins Other (See Comments)   Unknown childhood reaction        Medication List     PAUSE taking these medications    capecitabine 500 MG tablet Wait to take this until your doctor or other care provider tells you to start again. Commonly known as: XELODA Take #1 tablet (500 mg) in am and #2 tablets (1000 mg)  in pm. Take for 21 days, then hold for 7 days. Repeat every 28 days. Start cycle on 11/29/2023       TAKE these medications    acetaminophen 650 MG CR  tablet Commonly known as: TYLENOL Take 500 mg by mouth every 8 (eight) hours as needed for pain. 05/27/23: reports during [facility] call taking after recent surgery at [facility]   Baclofen 5 MG Tabs Take 1 tablet (5 mg total) by mouth 2 (two) times daily as needed for up to 7 days for muscle spasms.   ciprofloxacin 500 MG tablet Commonly known as: Cipro Take 1 tablet (500 mg total) by mouth 2 (two) times daily for 6 days.   lidocaine-prilocaine cream Commonly known as: EMLA Apply 1 Application topically as needed.   lipase/protease/amylase 12000-38000 units Cpep capsule Commonly known as: CREON Take 1 capsule (12,000 Units total) by mouth 3 (three) times daily before meals.   metroNIDAZOLE 500 MG tablet Commonly known as: Flagyl Take 1 tablet (500 mg total) by mouth 2 (two) times daily for 6 days.   omeprazole 20 MG capsule Commonly known as: PRILOSEC Take 1 capsule (20 mg total) by mouth daily.   ondansetron 8 MG tablet Commonly known as: ZOFRAN Take 1 tablet (8 mg total) by mouth every 8 (eight) hours as needed for nausea.   oxyCODONE 5 MG immediate release tablet Commonly known as: Oxy IR/ROXICODONE Take 1 tablet (5 mg total) by mouth every 6 (six) hours as needed for up to 3 days for moderate pain (pain score 4-6).   potassium chloride 10 MEQ tablet Commonly known as: KLOR-CON M Take 1 tablet (10 mEq total) by mouth 2 (two) times daily.   prochlorperazine 10 MG tablet Commonly known as: COMPAZINE Take 1 tablet (10 mg total) by mouth every 6 (six) hours as needed for nausea or vomiting.   tamsulosin 0.4 MG Caps capsule Commonly known as: FLOMAX TAKE 1 CAPSULE (0.4 MG TOTAL) BY MOUTH DAILY. 05/27/23: REPORTS DURING TOC CALL TAKING AFTER RECENT SURGERY AT [facility]   VITAMIN B-12 PO Take 1 tablet by mouth at bedtime.        Follow-up Information     Corwin Levins, MD. Schedule an appointment as soon as possible for a visit.   Specialties: Internal Medicine, Radiology Contact  information: [address] [place] [address] [phone]                Discharge Exam: Ceasar Mons Weights   11/17/23 1708  Weight: 77 kg   General: NAD  Cardiovascular: S1, S2 present Respiratory: CTAB Abdomen: Soft, nontender, nondistended, bowel sounds present, drain noted Musculoskeletal: No bilateral pedal edema noted Skin: Normal Psychiatry: Normal mood   Condition at discharge: stable  The results of significant diagnostics from this hospitalization (including imaging, microbiology, ancillary and laboratory) are listed below for reference.   Imaging Studies: IR INT EXT BILIARY DRAIN WITH CHOLANGIOGRAM Result Date: 11/19/2023 INDICATION: 83 year old male with pancreatic head mass, referred for internal/external biliary drain EXAM: IMAGE GUIDED PERCUTANEOUS TRANSHEPATIC CHOLANGIOGRAM WITH INTERNAL/EXTERNAL BILIARY DRAINAGE MEDICATIONS: 25 mg IV Benadryl ANESTHESIA/SEDATION: Moderate (conscious) sedation was employed during this procedure. A total of Versed 3.0 mg and Fentanyl 100 mcg was administered intravenously by the radiology nurse. Total intra-service moderate Sedation Time: 20 minutes. The patient's level of consciousness and vital signs were monitored continuously by radiology nursing throughout the procedure under my direct supervision. FLUOROSCOPY: Radiation Exposure Index (as provided by the fluoroscopic device): 157 mGy Kerma COMPLICATIONS: None PROCEDURE: The  procedure, risks, benefits, and alternatives were explained to the patient and the patient's family. A complete informed consent was performed, with risk benefit analysis. Specific risks that were discussed for the procedure include bleeding, infection, biliary sepsis, IC use day, organ injury, need for further procedure, need for further surgery, long-term drain placement, cardiopulmonary collapse, death. Questions regarding the procedure were encouraged and answered. The patient understands and consents  to the procedure. Patient is position in supine position on the fluoroscopy table, and the upper abdomen was prepped and draped in the usual sterile fashion. Maximum barrier sterile technique with sterile gowns and gloves were used for the procedure. A timeout was performed prior to the initiation of the procedure. Local anesthesia was provided with 1% lidocaine with epinephrine. Ultrasound survey of the right liver lobe was performed. 1% lidocaine was used for local anesthesia, with generous infiltration of the skin and subcutaneous tissues in and inter left costal location. A Chiba needle was advanced under ultrasound guidance into the right liver lobe. Three attempts with ultrasound were performed, each with the needle terminating in venous structure. We then performed a formal PTC attempt. Under fluoroscopic guidance the Chiba needle was advanced into the liver from mid axillary line at the level of the 10-11th rib. Stylet was removed and the needle was withdrawn slowly. On the first attempt only blood return. On the second attempt bile was identified. Contrast was injected identifying the tip in a reasonable right-sided biliary ductal radical. Once the tip of the needle was confirmed within the biliary system by injecting small aliquots of contrast, images were stored of the biliary system after partially opacifying the biliary tree via the needle. An 018 wire was then advanced centrally. The needle was removed, a small incision was made with an 11 blade scalpel, and then a triaxial Accustick system was advanced into the biliary system. The metal stiffener and dilator were removed, we confirmed placement with contrast infusion. A guidewire were did not navigate well through the outer 4 [place] sheath. A coon's wire was then placed in the biliary system in the 4 French sheath was removed. An angled Kumpe the catheter was then advanced on to the coon's wire which was manipulated then with the Kumpe the wire into  the common bile duct. The coon's wire and the Kumpe the catheter were then advanced into the small bowel. Dilation of the subcutaneous tissue tracks was performed with an 10 [place] dilator, and then a 12 [place] dilator. A 12 French internal/external biliary drain was then placed . Small amount of contrast confirmed location. The patient tolerated the procedure well and remained hemodynamically stable throughout. No complications were encountered and no significant blood loss was encountered. IMPRESSION: Status post image guided percutaneous transhepatic cholangiogram with internal/external biliary drain placement. Signed, Yvone Neu. Miachel Roux, RPVI [facility] Rad[facility] Electronically Signed   By: Gilmer Mor D.O.   On: 11/19/2023 14:09   MR ABDOMEN MRCP W WO CONTAST Result Date: 11/19/2023 CLINICAL DATA:  History of ampullary carcinoma with prior Whipple procedure in August 2024. Worsening biliary ductal dilatation. EXAM: MRI ABDOMEN WITHOUT AND WITH CONTRAST (INCLUDING MRCP) TECHNIQUE: Multiplanar multisequence MR imaging of the abdomen was performed both before and after the administration of intravenous contrast. Heavily T2-weighted images of the biliary and pancreatic ducts were obtained, and three-dimensional MRCP images were rendered by post processing. CONTRAST:  8mL GADAVIST GADOBUTROL 1 MMOL/ML IV SOLN COMPARISON:  MRI 10/22/2023.  CT 11/17/2023. FINDINGS: Lower chest:  Trace bilateral pleural fluid. Adjacent opacities as well. Atelectasis is favored. Hepatobiliary: Liver parenchyma is grossly preserved. No restricted diffusion, enhancing space-occupying mass lesion. No abnormal T2 signal. Portal vein is patent centrally for left portal vein appears slightly atretic. There is increasing intrahepatic biliary ductal dilatation compared to the previous MRI. This is diffuse. The common duct does have some abrupt tapering and heterogeneous signal this  extends to the area of the bowel anastomosis. There is some wall enhancement along the wall of the distal common duct at the level tapering. Please correlate with coronal series 20, image 30. On the MRCP datasets there is some filling defects within the course of the common duct. This could be debris or other process. In addition at the level of the anastomosis the bowel is some ill-defined soft tissue with spiculations, mixed signal on T2 and progressive enhancement on dynamic study. Favor this being fibrosis. This extends further along the course of the superior mesenteric vessels in the central mesentery. No restricted diffusion along this course. Gallbladder is surgically absent. Pancreas: Mildly atrophic. Changes from Whipple procedure. No new pancreatic mass. Spleen: Within normal limits in size and appearance. Small splenule. Adrenals/Urinary Tract: The adrenal glands are preserved. No enhancing renal mass or collecting system dilatation. Stomach/Bowel: Visualized bowel in the upper abdomen is nondilated. Scattered colonic stool. Vascular/Lymphatic: Atherosclerotic changes along the aorta. Normal caliber aorta and IVC. No discrete abnormal lymph node enlargement identified in the visualized abdomen. Other:  Increasing stranding and trace right upper quadrant ascites. Musculoskeletal: Diffuse degenerative changes along the spine. IMPRESSION: Again surgical changes of Whipple procedure for ampullary carcinoma. There is progressive moderate intrahepatic and extrahepatic biliary ductal dilatation with tapering of the common duct towards the anastomosis. At this location there is some filling defect noted along the course of the distal common duct. This would have a differential including debris. There is also persistent spiculated soft tissue at the anastomotic site. Based on appearance this could be fibrotic tissue or scar. However with the progressive biliary ductal dilatation and the slight enhancement of the  wall of the distal common duct recommend further evaluation. PET-CT scan may be of some benefit to further define the nature of this tissue and exclude a malignant component. No separate space-occupying liver lesion, nodal enlargement. Increasing right upper quadrant stranding and trace ascites. Electronically Signed   By: Karen Kays M.D.   On: 11/19/2023 10:20   MR 3D Recon At Scanner Result Date: 11/19/2023 CLINICAL DATA:  History of ampullary carcinoma with prior Whipple procedure in August 2024. Worsening biliary ductal dilatation. EXAM: MRI ABDOMEN WITHOUT AND WITH CONTRAST (INCLUDING MRCP) TECHNIQUE: Multiplanar multisequence MR imaging of the abdomen was performed both before and after the administration of intravenous contrast. Heavily T2-weighted images of the biliary and pancreatic ducts were obtained, and three-dimensional MRCP images were rendered by post processing. CONTRAST:  8mL GADAVIST GADOBUTROL 1 MMOL/ML IV SOLN COMPARISON:  MRI 10/22/2023.  CT 11/17/2023. FINDINGS: Lower chest: Trace bilateral pleural fluid. Adjacent opacities as well. Atelectasis is favored. Hepatobiliary: Liver parenchyma is grossly preserved. No restricted diffusion, enhancing space-occupying mass lesion. No abnormal T2 signal. Portal vein is patent centrally for left portal vein appears slightly atretic. There is increasing intrahepatic biliary ductal dilatation compared to the previous MRI. This is diffuse. The common duct does have some abrupt tapering and heterogeneous signal this extends to the area of the bowel anastomosis. There is some wall enhancement along the wall of the distal common duct at the level tapering.  Please correlate with coronal series 20, image 30. On the MRCP datasets there is some filling defects within the course of the common duct. This could be debris or other process. In addition at the level of the anastomosis the bowel is some ill-defined soft tissue with spiculations, mixed signal on T2  and progressive enhancement on dynamic study. Favor this being fibrosis. This extends further along the course of the superior mesenteric vessels in the central mesentery. No restricted diffusion along this course. Gallbladder is surgically absent. Pancreas: Mildly atrophic. Changes from Whipple procedure. No new pancreatic mass. Spleen: Within normal limits in size and appearance. Small splenule. Adrenals/Urinary Tract: The adrenal glands are preserved. No enhancing renal mass or collecting system dilatation. Stomach/Bowel: Visualized bowel in the upper abdomen is nondilated. Scattered colonic stool. Vascular/Lymphatic: Atherosclerotic changes along the aorta. Normal caliber aorta and IVC. No discrete abnormal lymph node enlargement identified in the visualized abdomen. Other:  Increasing stranding and trace right upper quadrant ascites. Musculoskeletal: Diffuse degenerative changes along the spine. IMPRESSION: Again surgical changes of Whipple procedure for ampullary carcinoma. There is progressive moderate intrahepatic and extrahepatic biliary ductal dilatation with tapering of the common duct towards the anastomosis. At this location there is some filling defect noted along the course of the distal common duct. This would have a differential including debris. There is also persistent spiculated soft tissue at the anastomotic site. Based on appearance this could be fibrotic tissue or scar. However with the progressive biliary ductal dilatation and the slight enhancement of the wall of the distal common duct recommend further evaluation. PET-CT scan may be of some benefit to further define the nature of this tissue and exclude a malignant component. No separate space-occupying liver lesion, nodal enlargement. Increasing right upper quadrant stranding and trace ascites. Electronically Signed   By: Karen Kays M.D.   On: 11/19/2023 10:20   DG Chest Portable 1 View Result Date: 11/17/2023 CLINICAL DATA:   Shortness of breath. Abdominal pain and nausea for 3 hours. Currently on chemotherapy for pancreatic cancer. EXAM: PORTABLE CHEST 1 VIEW COMPARISON:  10/09/2021 FINDINGS: Power port type central venous catheter with tip over the cavoatrial junction region. No pneumothorax. Heart size and pulmonary vascularity are normal. Shallow inspiration. There is infiltration or atelectasis in the left lung base. Right lung is clear. No pleural effusions. Mediastinal contours appear intact. IMPRESSION: Shallow inspiration with infiltration or atelectasis in the left lung base. Electronically Signed   By: Burman Nieves M.D.   On: 11/17/2023 23:58   CT ABDOMEN PELVIS W CONTRAST Result Date: 11/17/2023 CLINICAL DATA:  Epigastric pain. History of pancreatic cancer with Whipple. EXAM: CT ABDOMEN AND PELVIS WITH CONTRAST TECHNIQUE: Multidetector CT imaging of the abdomen and pelvis was performed using the standard protocol following bolus administration of intravenous contrast. RADIATION DOSE REDUCTION: This exam was performed according to the departmental dose-optimization program which includes automated exposure control, adjustment of the mA and/or kV according to patient size and/or use of iterative reconstruction technique. CONTRAST:  85mL OMNIPAQUE IOHEXOL 300 MG/ML  SOLN COMPARISON:  10/09/2023 FINDINGS: Lower chest: Dependent ground-glass opacities in the lower lobes, likely dependent atelectasis. No effusions. Hepatobiliary: Choledocho jejunostomy anatomy noted post Whipple procedure. Mild intrahepatic biliary ductal dilatation, increasing since the prior study. Common hepatic duct measures 9 mm in diameter compared to 7 mm previously. Soft tissue in the porta hepatis is stable since prior study. There is slight stranding in the retroperitoneum adjacent to this soft tissue which is also stable and felt  to reflect postoperative change. No focal hepatic abnormality. Pancreas: Status post Whipple and partial  pancreatectomy. No pancreatic ductal dilatation or focal abnormality. Spleen: No focal abnormality or ductal dilatation. Adrenals/Urinary Tract: No focal abnormality.  Normal size. Stomach/Bowel: Prior partial gastrostomy and gastrojejunostomy. No visible complicating feature. No bowel obstruction or inflammatory process. Sigmoid diverticulosis. No active diverticulitis. Vascular/Lymphatic: No evidence of aneurysm or adenopathy. Aortic atherosclerosis. Reproductive: Mildly prominent prostate Other: No free fluid or free air. Musculoskeletal: No acute bony abnormality. IMPRESSION: Increasing intrahepatic biliary ductal dilatation and prominence of the common hepatic duct which terminates within prominent soft tissue in the porta hepatis. This is similar/stable when compared to prior CT and MRI. Given the increasing biliary ductal dilatation, repeat MRI may be helpful to completely exclude residual recurrent mass. Aortic atherosclerosis. Sigmoid diverticulosis. Electronically Signed   By: Charlett Nose M.D.   On: 11/17/2023 21:08    Microbiology: Results for orders placed or performed during the hospital encounter of 11/17/23  Resp panel by RT-PCR (RSV, Flu A&B, Covid) Anterior Nasal Swab     Status: None   Collection Time: 11/17/23  5:10 PM   Specimen: Anterior Nasal Swab  Result Value Ref Range Status   SARS Coronavirus 2 by RT PCR NEGATIVE NEGATIVE Final    Comment: (NOTE) SARS-CoV-2 target nucleic acids are NOT DETECTED.  The SARS-CoV-2 RNA is generally detectable in upper respiratory specimens during the acute phase of infection. The lowest concentration of SARS-CoV-2 viral copies this assay can detect is 138 copies/mL. A negative result does not preclude SARS-Cov-2 infection and should not be used as the sole basis for treatment or other patient management decisions. A negative result may occur with  improper specimen collection/handling, submission of specimen other than nasopharyngeal swab,  presence of viral mutation(s) within the areas targeted by this assay, and inadequate number of viral copies(<138 copies/mL). A negative result must be combined with clinical observations, patient history, and epidemiological information. The expected result is Negative.  Fact Sheet for Patients:  BloggerCourse.com  Fact Sheet for Healthcare Providers:  SeriousBroker.it  This test is no t yet approved or cleared by the [place] FDA and  has been authorized for detection and/or diagnosis of SARS-CoV-2 by FDA under an Emergency Use Authorization (EUA). This EUA will remain  in effect (meaning this test can be used) for the duration of the COVID-19 declaration under Section 564(b)(1) of the Act, 21 U.S.C.section 360bbb-3(b)(1), unless the authorization is terminated  or revoked sooner.       Influenza A by PCR NEGATIVE NEGATIVE Final   Influenza B by PCR NEGATIVE NEGATIVE Final    Comment: (NOTE) The Xpert Xpress SARS-CoV-2/FLU/RSV plus assay is intended as an aid in the diagnosis of influenza from Nasopharyngeal swab specimens and should not be used as a sole basis for treatment. Nasal washings and aspirates are unacceptable for Xpert Xpress SARS-CoV-2/FLU/RSV testing.  Fact Sheet for Patients: BloggerCourse.com  Fact Sheet for Healthcare Providers: SeriousBroker.it  This test is not yet approved or cleared by the [place] FDA and has been authorized for detection and/or diagnosis of SARS-CoV-2 by FDA under an Emergency Use Authorization (EUA). This EUA will remain in effect (meaning this test can be used) for the duration of the COVID-19 declaration under Section 564(b)(1) of the Act, 21 U.S.C. section 360bbb-3(b)(1), unless the authorization is terminated or revoked.     Resp Syncytial Virus by PCR NEGATIVE NEGATIVE Final    Comment: (NOTE) Fact Sheet for  Patients: BloggerCourse.com  Fact Sheet for Healthcare Providers: SeriousBroker.it  This test is not yet approved or cleared by the [place] FDA and has been authorized for detection and/or diagnosis of SARS-CoV-2 by FDA under an Emergency Use Authorization (EUA). This EUA will remain in effect (meaning this test can be used) for the duration of the COVID-19 declaration under Section 564(b)(1) of the Act, 21 U.S.C. section 360bbb-3(b)(1), unless the authorization is terminated or revoked.  Performed at [facility], [address] [place], [place] [address]   Blood culture (routine x 2)     Status: None (Preliminary result)   Collection Time: 11/17/23  9:55 PM   Specimen: BLOOD RIGHT ARM  Result Value Ref Range Status   Specimen Description   Final    BLOOD RIGHT ARM Performed at [facility], [address] [place], [place] [address]    Special Requests   Final    BOTTLES DRAWN AEROBIC AND ANAEROBIC Blood Culture results may not be optimal due to an inadequate volume of blood received in culture bottles Performed at [facility], [address] [place], [place] [address]    Culture   Final    NO GROWTH 3 DAYS Performed at [facility], [address] [place], [place] [address]    Report Status PENDING  Incomplete  Blood culture (routine x 2)     Status: None (Preliminary result)   Collection Time: 11/17/23 10:06 PM   Specimen: BLOOD RIGHT HAND  Result Value Ref Range Status   Specimen Description   Final    BLOOD RIGHT HAND Performed at [facility], [address], [place], [place] [address]    Special Requests   Final    BOTTLES DRAWN AEROBIC AND ANAEROBIC Blood Culture results may not be optimal due to an inadequate volume of blood received in culture bottles Performed at [facility], [address]  [place], [place] [address]    Culture   Final    NO GROWTH 3 DAYS Performed at [facility], [address] [place], [place] [address]    Report Status PENDING  Incomplete    Labs: CBC: Recent Labs  Lab 11/15/23 1132 11/17/23 1710 11/18/23 1007 11/19/23 0453 11/20/23 0327 11/21/23 0523  WBC 6.8 20.4* 30.4* 18.3* 8.9 7.1  NEUTROABS 3.8  --  27.2*  --  7.0 4.7  HGB 14.2 13.8 12.8* 12.2* 12.2* 12.4*  HCT 42.1 41.2 37.4* 36.0* 37.0* 37.1*  MCV 95.5 96.9 95.7 96.8 97.4 96.9  PLT 233 303 221 179 194 207   Basic Metabolic Panel: Recent Labs  Lab 11/17/23 1710 11/18/23 1007 11/19/23 0453 11/20/23 0327 11/21/23 0523  NA 136 137 133* 138 137  K 3.8 4.4 4.0 4.1 3.9  CL 101 107 103 109 108  CO2 26 23 23 25 23   GLUCOSE 160* 130* 111* 101* 103*  BUN 9 11 13 10  7*  CREATININE 0.92 0.81 0.79 0.73 0.66  CALCIUM 9.1 8.1* 8.2* 8.4* 8.2*   Liver Function Tests: Recent Labs  Lab 11/17/23 1710 11/18/23 1007 11/19/23 0453 11/20/23 0327 11/21/23 0523  AST 141* 127* 82* 75* 62*  ALT 94* 112* 83* 74* 62*  ALKPHOS 279* 226* 188* 190* 195*  BILITOT 3.3* 5.5* 5.3* 5.5* 6.0*  PROT 6.6 5.5* 5.3* 5.4* 5.3*  ALBUMIN 3.8 3.2* 2.4* 2.3* 2.4*   CBG: No results for input(s): "GLUCAP" in the last 168 hours.  Discharge time spent: greater than 30 minutes.  Signed:  Briant Cedar, MD [facility] 11/21/2023

## 2023-11-21 NOTE — Progress Notes (Signed)
 Mobility Specialist - Progress Note   11/21/23 1225  Mobility  Activity Ambulated independently in hallway  Level of Assistance Independent  Assistive Device None  Distance Ambulated (ft) 500 ft  Activity Response Tolerated well  Mobility Referral Yes  Mobility visit 1 Mobility  Mobility Specialist Start Time (ACUTE ONLY) 1216  Mobility Specialist Stop Time (ACUTE ONLY) 1225  Mobility Specialist Time Calculation (min) (ACUTE ONLY) 9 min   Pt received in bed and agreeable to mobility. No complaints during session. Pt to bed after session with all needs met.    [facility]

## 2023-11-21 NOTE — Discharge Instructions (Signed)
 Flush drain once daily with 5 mL sterile saline.

## 2023-11-22 ENCOUNTER — Other Ambulatory Visit: Payer: Self-pay | Admitting: Oncology

## 2023-11-22 ENCOUNTER — Telehealth: Payer: Self-pay

## 2023-11-22 ENCOUNTER — Inpatient Hospital Stay: Payer: Medicare Other

## 2023-11-22 MED ORDER — TAMSULOSIN HCL 0.4 MG PO CAPS: 0.4000 mg | ORAL_CAPSULE | Freq: Every day | ORAL | 3 refills | Status: AC

## 2023-11-22 NOTE — Transitions of Care (Post Inpatient/ED Visit) (Signed)
 11/22/2023  Name: Charles Lawson MRN: [id number] DOB: 04/11/1941  Today's TOC FU Call Status: Today's TOC FU Call Status:: Successful TOC FU Call Completed TOC FU Call Complete Date: 11/22/23 Patient's Name and Date of Birth confirmed.  Transition Care Management Follow-up Telephone Call Date of Discharge: 11/21/23 Discharge Facility: [facility]) Type of Discharge: Inpatient Admission Primary Inpatient Discharge Diagnosis:: neoplasm How have you been since you were released from the hospital?: Better Any questions or concerns?: No  Items Reviewed: Did you receive and understand the discharge instructions provided?: Yes Medications obtained,verified, and reconciled?: Yes (Medications Reviewed) Any new allergies since your discharge?: No Dietary orders reviewed?: Yes Do you have support at home?: Yes People in Home: spouse  Medications Reviewed Today: Medications Reviewed Today     Reviewed by Karena Addison, LPN (Licensed Practical Nurse) on 11/22/23 at 1014  Med List Status: <None>   Medication Order Taking? Sig Documenting Provider Last Dose Status Informant  acetaminophen (TYLENOL) 650 MG CR tablet [id number] No Take 500 mg by mouth every 8 (eight) hours as needed for pain. 05/27/23: reports during [facility] call taking after recent surgery at [facility] Corwin Levins, MD Not Taking Active Spouse/Significant Other           Med Note Merlene Morse, KIMBERLY L   Thu Nov 18, 2023 11:20 AM) prn  baclofen 5 MG TABS [id number]  Take 1 tablet (5 mg total) by mouth 2 (two) times daily as needed for up to 7 days for muscle spasms. Briant Cedar, MD  Active   capecitabine (XELODA) 500 MG tablet [id number] No Take #1 tablet (500 mg) in am and #2 tablets (1000 mg) in pm. Take for 21 days, then hold for 7 days. Repeat every 28 days. Start cycle on 11/29/2023 Rana Snare, NP 11/17/2023 Morning Active Spouse/Significant Other  ciprofloxacin (CIPRO) 500 MG tablet [id number]  Take 1 tablet (500 mg  total) by mouth 2 (two) times daily for 6 days. Briant Cedar, MD  Active   Cyanocobalamin (VITAMIN B-12 PO) [id number] No Take 1 tablet by mouth at bedtime. [provider] 11/16/2023 Active Spouse/Significant Other  lidocaine-prilocaine (EMLA) cream [id number] No Apply 1 Application topically as needed. Rana Snare, NP 11/15/2023 Active Spouse/Significant Other  lipase/protease/amylase (CREON) 12000-38000 units CPEP capsule [id number] No Take 1 capsule (12,000 Units total) by mouth 3 (three) times daily before meals. Rana Snare, NP 11/17/2023 Noon Active Spouse/Significant Other  metroNIDAZOLE (FLAGYL) 500 MG tablet [id number]  Take 1 tablet (500 mg total) by mouth 2 (two) times daily for 6 days. Briant Cedar, MD  Active   omeprazole (PRILOSEC) 20 MG capsule [id number] No Take 1 capsule (20 mg total) by mouth daily. Corwin Levins, MD 11/17/2023 Morning Active Spouse/Significant Other  ondansetron (ZOFRAN) 8 MG tablet [id number] No Take 1 tablet (8 mg total) by mouth every 8 (eight) hours as needed for nausea. Ladene Artist, MD 11/17/2023 Evening Active Spouse/Significant Other  oxyCODONE (OXY IR/ROXICODONE) 5 MG immediate release tablet [id number]  Take 1 tablet (5 mg total) by mouth every 6 (six) hours as needed for up to 3 days for moderate pain (pain score 4-6). Briant Cedar, MD  Active   potassium chloride (KLOR-CON M) 10 MEQ tablet [id number] No Take 1 tablet (10 mEq total) by mouth 2 (two) times daily. Ladene Artist, MD 11/17/2023 Morning Active Spouse/Significant Other           Med Note (COWARD, SUSAN L   Mon  Oct 04, 2023  9:27 AM) Changed to 10 meq daily   prochlorperazine (COMPAZINE) 10 MG tablet [id number] No Take 1 tablet (10 mg total) by mouth every 6 (six) hours as needed for nausea or vomiting. Ladene Artist, MD Taking Active Spouse/Significant Other           Med Note Penni Bombard   Thu Nov 18, 2023 11:21 AM) prn  tamsulosin (FLOMAX) 0.4 MG  CAPS capsule [id number] No TAKE 1 CAPSULE (0.4 MG TOTAL) BY MOUTH DAILY. 05/27/23: REPORTS DURING TOC CALL TAKING AFTER RECENT SURGERY AT [facility], MD 11/17/2023 Morning Active Spouse/Significant Other  Med List Note Effie Shy, [facility] 06/17/23 6295): Xeloda filled at [facility] and Equipment/Supplies: Were Home Health Services Ordered?: NA Any new equipment or medical supplies ordered?: NA  Functional Questionnaire: Do you need assistance with bathing/showering or dressing?: No Do you need assistance with meal preparation?: No Do you need assistance with eating?: No Do you have difficulty maintaining continence: No Do you need assistance with getting out of bed/getting out of a chair/moving?: No Do you have difficulty managing or taking your medications?: No  Follow up appointments reviewed: PCP Follow-up appointment confirmed?: Yes Date of PCP follow-up appointment?: 11/25/23 Follow-up Provider: [facility] Follow-up appointment confirmed?: No Reason Specialist Follow-Up Not Confirmed: Patient has Specialist Provider Number and will Call for Appointment Do you need transportation to your follow-up appointment?: No Do you understand care options if your condition(s) worsen?: Yes-patient verbalized understanding    SIGNATURE Karena Addison, LPN [facility] Nurse Health Advisor Direct Dial [phone]

## 2023-11-22 NOTE — Telephone Encounter (Signed)
 Copied from CRM [id number]. Topic: Clinical - Prescription Issue >> Nov 22, 2023  8:45 AM Gurney Maxin H wrote: Reason for CRM: Patients wife is calling to inquire about medication tamsulosin (FLOMAX) 0.4 MG CAPS capsule, shows discontinued and patient is supposed to resume medication at discharge patient was just released from hospital. Please reach out with some clarity, thanks.  Khiree [phone]

## 2023-11-22 NOTE — Addendum Note (Signed)
 Addended by: Corwin Levins on: 11/22/2023 08:05 PM   Modules accepted: Orders

## 2023-11-22 NOTE — Telephone Encounter (Signed)
 Ok to continue flomax - new rx sent

## 2023-11-23 ENCOUNTER — Encounter: Payer: Self-pay | Admitting: Oncology

## 2023-11-23 ENCOUNTER — Other Ambulatory Visit: Payer: Self-pay

## 2023-11-23 LAB — CULTURE, BLOOD (ROUTINE X 2)
Culture: NO GROWTH
Culture: NO GROWTH

## 2023-11-23 NOTE — Telephone Encounter (Signed)
 Called and let Pt know

## 2023-11-25 ENCOUNTER — Encounter: Payer: Self-pay | Admitting: Internal Medicine

## 2023-11-25 ENCOUNTER — Ambulatory Visit: Admitting: Internal Medicine

## 2023-11-25 VITALS — BP 128/78 | HR 85 | Temp 98.0°F | Ht 71.0 in | Wt 163.0 lb

## 2023-11-25 DIAGNOSIS — E559 Vitamin D deficiency, unspecified: Secondary | ICD-10-CM | POA: Diagnosis not present

## 2023-11-25 DIAGNOSIS — C241 Malignant neoplasm of ampulla of Vater: Secondary | ICD-10-CM

## 2023-11-25 DIAGNOSIS — R7302 Impaired glucose tolerance (oral): Secondary | ICD-10-CM

## 2023-11-25 NOTE — Progress Notes (Signed)
 Patient ID: Charles Lawson, male   DOB: 1940-09-25, 83 y.o.   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 male here with above, admitted with presumed cholangitis with sudden onset pain, nausea, elevated LFT and WBC.  Began empiric antibx, blood Cx neg, labs improved, had MRCP, percutaneous biliary drain plaed, and pt able for transition to cipro flagyl for which he will finish mar 8.  Only c/o today is fatigue, and nausea but controlled with current meds.  Has no fever, chills, or pain.  No other new complaints. Has Dr Truett Perna oncology for next mon feb 10, then IR f/u apr 10, then GI soon as well.   Xeloda being held for now.   Drainage is consistent and steady so far.   Wt Readings from Last 3 Encounters:  11/25/23 163 lb (73.9 kg)  11/17/23 169 lb 12.8 oz (77 kg)  11/15/23 169 lb 12.8 oz (77 kg)   BP Readings from Last 3 Encounters:  11/25/23 128/78  11/21/23 132/82  11/15/23 118/78         Past Medical History:  Diagnosis Date   Allergic rhinitis, cause unspecified    Arthritis    Blood pressure elevated without history of HTN    BPH (benign prostatic hyperplasia)    Diverticulosis of colon    ED (erectile dysfunction)    Helicobacter pylori gastritis 04/2023   Tx quad tx and eradication documented neg stool ag 07/2023   History of basal cell carcinoma (BCC) excision    Hyperlipidemia    Impaired glucose tolerance 04/03/2013   Personal history of colonic polyps 06/07/2012   Past Surgical History:  Procedure Laterality Date   Quintella Reichert OSTEOTOMY Left 07/19/2020   Procedure: Quintella Reichert OSTEOTOMY;  Surgeon: Edwin Cap, DPM;  Location: [facility];  Service: Podiatry;  Laterality: Left;   BIOPSY  05/10/2023   Procedure: BIOPSY;  Surgeon: Iva Boop, MD;  Location: [facility];  Service: Gastroenterology;;   BIOPSY  05/12/2023   Procedure: BIOPSY;   Surgeon: Iva Boop, MD;  Location: [facility];  Service: Gastroenterology;;   Arbutus Leas Left 07/19/2020   Procedure: Delos Haring;  Surgeon: Edwin Cap, DPM;  Location: [facility];  Service: Podiatry;  Laterality: Left;   COLONOSCOPY  last one 10-06-2017  dr Leone Payor   ESOPHAGOGASTRODUODENOSCOPY N/A 05/12/2023   Procedure: ESOPHAGOGASTRODUODENOSCOPY (EGD);  Surgeon: Iva Boop, MD;  Location: [facility];  Service: Gastroenterology;  Laterality: N/A;   ESOPHAGOGASTRODUODENOSCOPY (EGD) WITH PROPOFOL N/A 05/10/2023   Procedure: ESOPHAGOGASTRODUODENOSCOPY (EGD) WITH PROPOFOL;  Surgeon: Iva Boop, MD;  Location: [facility];  Service: Gastroenterology;  Laterality: N/A;   HAMMER TOE SURGERY Left 07/19/2020   Procedure: HAMMER TOE CORRECTION;  Surgeon: Edwin Cap, DPM;  Location: [facility];  Service: Podiatry;  Laterality: Left;   INGUINAL HERNIA REPAIR Right 11-21-2008  @ [facility]   IR IMAGING GUIDED PORT INSERTION  06/18/2023   IR INT EXT BILIARY DRAIN WITH CHOLANGIOGRAM  11/19/2023   LUMBAR LAMINECTOMY  1974   METATARSAL OSTEOTOMY Left 07/19/2020   Procedure: METATARSAL OSTEOTOMY;  Surgeon: Edwin Cap, DPM;  Location: [facility];  Service: Podiatry;  Laterality: Left;   ORIF TOE FRACTURE Left 07/19/2020   Procedure: OPEN TREATMENT OF METARSAL PHALANGEAL JOINT DISLOCATION;  Surgeon: Edwin Cap, DPM;  Location: [facility];  Service: Podiatry;  Laterality: Left;  ANKLE BLOCK WITH THIGH TOURNIQUET    reports that he has never smoked. He has never used smokeless tobacco. He reports current alcohol use. He reports that he does not use drugs. family history includes COPD in his mother; Cancer in his father; Lung cancer in his father. Allergies  Allergen Reactions   Penicillins Other (See Comments)    Unknown childhood reaction   Current Outpatient Medications on File Prior to Visit   Medication Sig Dispense Refill   acetaminophen (TYLENOL) 650 MG CR tablet Take 500 mg by mouth every 8 (eight) hours as needed for pain. 05/27/23: reports during [facility] call taking after recent surgery at [facility]     baclofen 5 MG TABS Take 1 tablet (5 mg total) by mouth 2 (two) times daily as needed for up to 7 days for muscle spasms. 14 tablet 0   ciprofloxacin (CIPRO) 500 MG tablet Take 1 tablet (500 mg total) by mouth 2 (two) times daily for 6 days. 12 tablet 0   Cyanocobalamin (VITAMIN B-12 PO) Take 1 tablet by mouth at bedtime.     lidocaine-prilocaine (EMLA) cream Apply 1 Application topically as needed. 30 g 2   lipase/protease/amylase (CREON) 12000-38000 units CPEP capsule Take 1 capsule (12,000 Units total) by mouth 3 (three) times daily before meals. 270 capsule 1   metroNIDAZOLE (FLAGYL) 500 MG tablet Take 1 tablet (500 mg total) by mouth 2 (two) times daily for 6 days. 12 tablet 0   omeprazole (PRILOSEC) 20 MG capsule Take 1 capsule (20 mg total) by mouth daily. 90 capsule 3   ondansetron (ZOFRAN) 8 MG tablet Take 1 tablet (8 mg total) by mouth every 8 (eight) hours as needed for nausea. 30 tablet 1   oxycodone (OXY-IR) 5 MG capsule Take 5 mg by mouth every 6 (six) hours as needed for pain.     potassium chloride (KLOR-CON M) 10 MEQ tablet Take 1 tablet (10 mEq total) by mouth 2 (two) times daily. 180 tablet 0   prochlorperazine (COMPAZINE) 10 MG tablet Take 1 tablet (10 mg total) by mouth every 6 (six) hours as needed for nausea or vomiting. 60 tablet 1   tamsulosin (FLOMAX) 0.4 MG CAPS capsule Take 1 capsule (0.4 mg total) by mouth daily. 05/27/23: reports during [facility] call taking after recent surgery at [facility] 90 capsule 3   [Paused] capecitabine (XELODA) 500 MG tablet Take #1 tablet (500 mg) in am and #2 tablets (1000 mg) in pm. Take for 21 days, then hold for 7 days. Repeat every 28 days. Start cycle on 11/29/2023 (Patient not taking: Reported on 11/25/2023) 63 tablet 0   Current  Facility-Administered Medications on File Prior to Visit  Medication Dose Route Frequency Provider Last Rate Last Admin   sodium chloride flush (NS) 0.9 % injection 10 mL  10 mL Intravenous PRN Ladene Artist, MD   10 mL at 10/18/23 1310        ROS:  All others reviewed and negative.  Objective        PE:  BP 128/78 (BP Location: Left Arm, Patient Position: Sitting, Cuff Size: Normal)   Pulse 85   Temp 98 F (36.7 C) (Oral)   Ht 5\' 11"  (1.803 m)   Wt 163 lb (73.9 kg)   SpO2 98%   BMI 22.73 kg/m  Constitutional: Pt appears in NAD but fatigued, general weak mild                HENT: Head: NCAT.                Right Ear: External ear normal.                 Left Ear: External ear normal.                Eyes: . Pupils are equal, round, and reactive to light. Conjunctivae and EOM are normal               Nose: without d/c or deformity               Neck: Neck supple. Gross normal ROM               Cardiovascular: Normal rate and regular rhythm.                 Pulmonary/Chest: Effort normal and breath sounds without rales or wheezing.                Abd:  Soft, NT, ND, + BS, no organomegaly; right biliary drain intact with drain bag working well.                Neurological: Pt is alert. At baseline orientation, motor grossly intact               Skin: Skin is warm. No rashes, no other new lesions, LE edema - none               Psychiatric: Pt behavior is normal without agitation   Micro: none  Cardiac tracings I have personally interpreted today:  none  Pertinent Radiological findings (summarize): none   Lab Results  Component Value Date   WBC 7.1 11/21/2023   HGB 12.4 (L) 11/21/2023   HCT 37.1 (L) 11/21/2023   PLT 207 11/21/2023   GLUCOSE 103 (H) 11/21/2023   CHOL 144 06/24/2022   TRIG 80.0 06/24/2022   HDL 43.60 06/24/2022   LDLDIRECT 88.0 04/02/2020   LDLCALC 84 06/24/2022   ALT 62 (H) 11/21/2023   AST 62 (H) 11/21/2023   NA 137 11/21/2023   K 3.9  11/21/2023   CL 108 11/21/2023   CREATININE 0.66 11/21/2023   BUN 7 (L) 11/21/2023   CO2 23 11/21/2023   TSH 1.44 06/24/2022   PSA 4.99 (H) 06/23/2021   INR 1.5 (H) 11/19/2023   HGBA1C 5.1 05/14/2023   Assessment/Plan:  Charles Lawson is a 83 y.o. White or Caucasian [1] male with  has a past medical history of Allergic rhinitis, cause unspecified, Arthritis, Blood pressure elevated without history of HTN, BPH (benign prostatic hyperplasia), Diverticulosis of colon, ED (erectile dysfunction), Helicobacter pylori gastritis (04/2023), History of basal cell carcinoma (BCC) excision, Hyperlipidemia, Impaired glucose tolerance (04/03/2013), and Personal history of colonic polyps (06/07/2012).  Ampullary carcinoma (HCC) Now s/p biliary drain placed overall doing well with fatigue and nausea only controlled with current meds,  pt for f/u as planned with oncology, IR and GI; cont's to hold xeloda, and to finish cipro flagyl on mar 8  Impaired glucose tolerance Has overall lost over 40 lbs, for boost,  Lab Results  Component Value Date   HGBA1C 5.1 05/14/2023   Stable, pt to continue current medical treatment  - diet, wt control   Vitamin D deficiency Last vitamin D Lab Results  Component Value Date   VD25OH 34.08 06/24/2022   Low, to start oral replacement  Followup: No follow-ups on file.  Oliver Barre, MD 11/25/2023 12:12 PM [facility]

## 2023-11-25 NOTE — Assessment & Plan Note (Addendum)
 Now s/p biliary drain placed overall doing well with fatigue and nausea only controlled with current meds,  pt for f/u as planned with oncology, IR and GI; cont's to hold xeloda, and to finish cipro flagyl on mar 8

## 2023-11-25 NOTE — Assessment & Plan Note (Signed)
Last vitamin D Lab Results  Component Value Date   VD25OH 34.08 06/24/2022   Low, to start oral replacement

## 2023-11-25 NOTE — Assessment & Plan Note (Signed)
 Has overall lost over 40 lbs, for boost,  Lab Results  Component Value Date   HGBA1C 5.1 05/14/2023   Stable, pt to continue current medical treatment  - diet, wt control

## 2023-11-25 NOTE — Patient Instructions (Addendum)
 Please continue all other medications as before, and refills have been done if requested.  Please have the pharmacy call with any other refills you may need.  Please keep your appointments with your specialists as you may have planned - oncology mar 10, and IR for apr 10  We will ask the Nurse to assist with supplies for dressing changes as we have them  No further lab work needed today  Keep working on your dressing changes and Boost!  Ok to delay the next appointment in April for 3 months later

## 2023-11-26 ENCOUNTER — Other Ambulatory Visit: Payer: Self-pay

## 2023-11-29 ENCOUNTER — Inpatient Hospital Stay: Payer: Medicare Other

## 2023-11-29 ENCOUNTER — Inpatient Hospital Stay (HOSPITAL_BASED_OUTPATIENT_CLINIC_OR_DEPARTMENT_OTHER): Payer: Medicare Other | Admitting: Oncology

## 2023-11-29 ENCOUNTER — Other Ambulatory Visit: Payer: Medicare Other

## 2023-11-29 ENCOUNTER — Ambulatory Visit: Payer: Medicare Other

## 2023-11-29 ENCOUNTER — Ambulatory Visit: Payer: Medicare Other | Admitting: Nurse Practitioner

## 2023-11-29 ENCOUNTER — Other Ambulatory Visit: Payer: Self-pay | Admitting: *Deleted

## 2023-11-29 ENCOUNTER — Inpatient Hospital Stay: Payer: Medicare Other | Attending: Nurse Practitioner

## 2023-11-29 VITALS — BP 119/83 | HR 100 | Temp 98.2°F | Resp 18 | Ht 71.0 in | Wt 160.1 lb

## 2023-11-29 DIAGNOSIS — B9681 Helicobacter pylori [H. pylori] as the cause of diseases classified elsewhere: Secondary | ICD-10-CM | POA: Insufficient documentation

## 2023-11-29 DIAGNOSIS — C241 Malignant neoplasm of ampulla of Vater: Secondary | ICD-10-CM | POA: Diagnosis not present

## 2023-11-29 DIAGNOSIS — Z5111 Encounter for antineoplastic chemotherapy: Secondary | ICD-10-CM | POA: Insufficient documentation

## 2023-11-29 DIAGNOSIS — R630 Anorexia: Secondary | ICD-10-CM | POA: Insufficient documentation

## 2023-11-29 DIAGNOSIS — K8309 Other cholangitis: Secondary | ICD-10-CM | POA: Diagnosis not present

## 2023-11-29 DIAGNOSIS — Z79899 Other long term (current) drug therapy: Secondary | ICD-10-CM | POA: Insufficient documentation

## 2023-11-29 DIAGNOSIS — R748 Abnormal levels of other serum enzymes: Secondary | ICD-10-CM | POA: Diagnosis not present

## 2023-11-29 DIAGNOSIS — R5381 Other malaise: Secondary | ICD-10-CM | POA: Diagnosis not present

## 2023-11-29 DIAGNOSIS — Z8719 Personal history of other diseases of the digestive system: Secondary | ICD-10-CM | POA: Insufficient documentation

## 2023-11-29 DIAGNOSIS — R21 Rash and other nonspecific skin eruption: Secondary | ICD-10-CM | POA: Diagnosis not present

## 2023-11-29 DIAGNOSIS — Z9049 Acquired absence of other specified parts of digestive tract: Secondary | ICD-10-CM | POA: Diagnosis not present

## 2023-11-29 DIAGNOSIS — M48061 Spinal stenosis, lumbar region without neurogenic claudication: Secondary | ICD-10-CM | POA: Insufficient documentation

## 2023-11-29 LAB — CBC WITH DIFFERENTIAL (CANCER CENTER ONLY)
Abs Immature Granulocytes: 0.05 10*3/uL (ref 0.00–0.07)
Basophils Absolute: 0.1 10*3/uL (ref 0.0–0.1)
Basophils Relative: 1 %
Eosinophils Absolute: 0.1 10*3/uL (ref 0.0–0.5)
Eosinophils Relative: 1 %
HCT: 45.3 % (ref 39.0–52.0)
Hemoglobin: 15.2 g/dL (ref 13.0–17.0)
Immature Granulocytes: 1 %
Lymphocytes Relative: 23 %
Lymphs Abs: 2.3 10*3/uL (ref 0.7–4.0)
MCH: 31.5 pg (ref 26.0–34.0)
MCHC: 33.6 g/dL (ref 30.0–36.0)
MCV: 94 fL (ref 80.0–100.0)
Monocytes Absolute: 0.9 10*3/uL (ref 0.1–1.0)
Monocytes Relative: 9 %
Neutro Abs: 6.5 10*3/uL (ref 1.7–7.7)
Neutrophils Relative %: 65 %
Platelet Count: 309 10*3/uL (ref 150–400)
RBC: 4.82 MIL/uL (ref 4.22–5.81)
RDW: 16.3 % — ABNORMAL HIGH (ref 11.5–15.5)
WBC Count: 10 10*3/uL (ref 4.0–10.5)
nRBC: 0 % (ref 0.0–0.2)

## 2023-11-29 LAB — CMP (CANCER CENTER ONLY)
ALT: 27 U/L (ref 0–44)
AST: 39 U/L (ref 15–41)
Albumin: 3.8 g/dL (ref 3.5–5.0)
Alkaline Phosphatase: 200 U/L — ABNORMAL HIGH (ref 38–126)
Anion gap: 8 (ref 5–15)
BUN: 17 mg/dL (ref 8–23)
CO2: 26 mmol/L (ref 22–32)
Calcium: 9.2 mg/dL (ref 8.9–10.3)
Chloride: 101 mmol/L (ref 98–111)
Creatinine: 0.82 mg/dL (ref 0.61–1.24)
GFR, Estimated: >60 mL/min (ref >60)
Glucose, Bld: 135 mg/dL — ABNORMAL HIGH (ref 70–99)
Potassium: 4.2 mmol/L (ref 3.5–5.1)
Sodium: 135 mmol/L (ref 135–145)
Total Bilirubin: 6 mg/dL (ref 0.0–1.2)
Total Protein: 7.1 g/dL (ref 6.5–8.1)

## 2023-11-29 MED ORDER — PROCHLORPERAZINE MALEATE 10 MG PO TABS
10.0000 mg | ORAL_TABLET | Freq: Once | ORAL | Status: AC
  Administered 2023-11-29: 10 mg via ORAL
  Filled 2023-11-29: qty 1

## 2023-11-29 MED ORDER — SODIUM CHLORIDE 0.9 % IV SOLN: Freq: Once | INTRAVENOUS | Status: AC

## 2023-11-29 MED ORDER — SODIUM CHLORIDE 0.9% FLUSH
10.0000 mL | INTRAVENOUS | Status: DC | PRN
  Administered 2023-11-29: 10 mL

## 2023-11-29 MED ORDER — HEPARIN SOD (PORK) LOCK FLUSH 100 UNIT/ML IV SOLN
500.0000 [IU] | Freq: Once | INTRAVENOUS | Status: AC | PRN
  Administered 2023-11-29: 500 [IU]

## 2023-11-29 MED ORDER — SODIUM CHLORIDE 0.9 % IV SOLN
2000.0000 mg | Freq: Once | INTRAVENOUS | Status: AC
  Administered 2023-11-29: 2000 mg via INTRAVENOUS
  Filled 2023-11-29: qty 52.6

## 2023-11-29 MED ORDER — FLUCONAZOLE 100 MG PO TABS: 100.0000 mg | ORAL_TABLET | Freq: Every day | ORAL | 0 refills | Status: DC

## 2023-11-29 NOTE — Patient Instructions (Signed)
 [facility] A DE[facility] OF [facility]   Discharge Instructions: Thank you for choosing [facility] to provide your oncology and hematology care.   If you have a lab appointment with the Cancer Center, please go directly to the Cancer Center and check in at the registration area.   Wear comfortable clothing and clothing appropriate for easy access to any Portacath or PICC line.   We strive to give you quality time with your provider. You may need to reschedule your appointment if you arrive late (15 or more minutes).  Arriving late affects you and other patients whose appointments are after yours.  Also, if you miss three or more appointments without notifying the office, you may be dismissed from the clinic at the provider's discretion.      For prescription refill requests, have your pharmacy contact our office and allow 72 hours for refills to be completed.    Today you received the following chemotherapy and/or immunotherapy agents Gemcitabine (GEMZAR).      To help prevent nausea and vomiting after your treatment, we encourage you to take your nausea medication as directed.  BELOW ARE SYMPTOMS THAT SHOULD BE REPORTED IMMEDIATELY: *FEVER GREATER THAN 100.4 F (38 C) OR HIGHER *CHILLS OR SWEATING *NAUSEA AND VOMITING THAT IS NOT CONTROLLED WITH YOUR NAUSEA MEDICATION *UNUSUAL SHORTNESS OF BREATH *UNUSUAL BRUISING OR BLEEDING *URINARY PROBLEMS (pain or burning when urinating, or frequent urination) *BOWEL PROBLEMS (unusual diarrhea, constipation, pain near the anus) TENDERNESS IN MOUTH AND THROAT WITH OR WITHOUT PRESENCE OF ULCERS (sore throat, sores in mouth, or a toothache) UNUSUAL RASH, SWELLING OR PAIN  UNUSUAL VAGINAL DISCHARGE OR ITCHING   Items with * indicate a potential emergency and should be followed up as soon as possible or go to the Emergency Department if any problems should occur.  Please show the CHEMOTHERAPY ALERT CARD or  IMMUNOTHERAPY ALERT CARD at check-in to the Emergency Department and triage nurse.  Should you have questions after your visit or need to cancel or reschedule your appointment, please contact [facility] - A DEPT OF [facility]  Dept: [phone]  and follow the prompts.  Office hours are 8:00 a.m. to 4:30 p.m. Monday - Friday. Please note that voicemails left after 4:00 p.m. may not be returned until the following business day.  We are closed weekends and major holidays. You have access to a nurse at all times for urgent questions. Please call the main number to the clinic Dept: [phone] and follow the prompts.   For any non-urgent questions, you may also contact your provider using MyChart. We now offer e-Visits for anyone 57 and older to request care online for non-urgent symptoms. For details visit [email or web address].   Also download the MyChart app! Go to the app store, search "MyChart", open the app, select [facility], and log in with your MyChart username and password.  Gemcitabine Injection What is this medication? GEMCITABINE (jem SYE ta been) treats some types of cancer. It works by slowing down the growth of cancer cells. This medicine may be used for other purposes; ask your health care provider or pharmacist if you have questions. COMMON BRAND NAME(S): Gemzar, Infugem What should I tell my care team before I take this medication? They need to know if you have any of these conditions: Blood disorders Infection Kidney disease Liver disease Lung or breathing disease, such as asthma or COPD Recent or ongoing radiation therapy  An unusual or allergic reaction to gemcitabine, other medications, foods, dyes, or preservatives If you or your partner are pregnant or trying to get pregnant Breast-feeding How should I use this medication? This medication is injected into a vein. It is given by your care team in a hospital or clinic setting. Talk  to your care team about the use of this medication in children. Special care may be needed. Overdosage: If you think you have taken too much of this medicine contact a poison control center or emergency room at once. NOTE: This medicine is only for you. Do not share this medicine with others. What if I miss a dose? Keep appointments for follow-up doses. It is important not to miss your dose. Call your care team if you are unable to keep an appointment. What may interact with this medication? Interactions have not been studied. This list may not describe all possible interactions. Give your health care provider a list of all the medicines, herbs, non-prescription drugs, or dietary supplements you use. Also tell them if you smoke, drink alcohol, or use illegal drugs. Some items may interact with your medicine. What should I watch for while using this medication? Your condition will be monitored carefully while you are receiving this medication. This medication may make you feel generally unwell. This is not uncommon, as chemotherapy can affect healthy cells as well as cancer cells. Report any side effects. Continue your course of treatment even though you feel ill unless your care team tells you to stop. In some cases, you may be given additional medications to help with side effects. Follow all directions for their use. This medication may increase your risk of getting an infection. Call your care team for advice if you get a fever, chills, sore throat, or other symptoms of a cold or flu. Do not treat yourself. Try to avoid being around people who are sick. This medication may increase your risk to bruise or bleed. Call your care team if you notice any unusual bleeding. Be careful brushing or flossing your teeth or using a toothpick because you may get an infection or bleed more easily. If you have any dental work done, tell your dentist you are receiving this medication. Avoid taking medications that  contain aspirin, acetaminophen, ibuprofen, naproxen, or ketoprofen unless instructed by your care team. These medications may hide a fever. Talk to your care team if you or your partner wish to become pregnant or think you might be pregnant. This medication can cause serious birth defects if taken during pregnancy and for 6 months after the last dose. A negative pregnancy test is required before starting this medication. A reliable form of contraception is recommended while taking this medication and for 6 months after the last dose. Talk to your care team about effective forms of contraception. Do not father a child while taking this medication and for 3 months after the last dose. Use a condom while having sex during this time period. Do not breastfeed while taking this medication and for at least 1 week after the last dose. This medication may cause infertility. Talk to your care team if you are concerned about your fertility. What side effects may I notice from receiving this medication? Side effects that you should report to your care team as soon as possible: Allergic reactions--skin rash, itching, hives, swelling of the face, lips, tongue, or throat Capillary leak syndrome--stomach or muscle pain, unusual weakness or fatigue, feeling faint or lightheaded, decrease in the amount  of urine, swelling of the ankles, hands, or feet, trouble breathing Infection--fever, chills, cough, sore throat, wounds that don't heal, pain or trouble when passing urine, general feeling of discomfort or being unwell Liver injury--right upper belly pain, loss of appetite, nausea, light-colored stool, dark yellow or brown urine, yellowing skin or eyes, unusual weakness or fatigue Low red blood cell level--unusual weakness or fatigue, dizziness, headache, trouble breathing Lung injury--shortness of breath or trouble breathing, cough, spitting up blood, chest pain, fever Stomach pain, bloody diarrhea, pale skin, unusual  weakness or fatigue, decrease in the amount of urine, which may be signs of hemolytic uremic syndrome Sudden and severe headache, confusion, change in vision, seizures, which may be signs of posterior reversible encephalopathy syndrome (PRES) Unusual bruising or bleeding Side effects that usually do not require medical attention (report to your care team if they continue or are bothersome): Diarrhea Drowsiness Hair loss Nausea Pain, redness, or swelling with sores inside the mouth or throat Vomiting This list may not describe all possible side effects. Call your doctor for medical advice about side effects. You may report side effects to FDA at 1-800-FDA-1088. Where should I keep my medication? This medication is given in a hospital or clinic. It will not be stored at home. NOTE: This sheet is a summary. It may not cover all possible information. If you have questions about this medicine, talk to your doctor, pharmacist, or health care provider.  2024 Elsevier/Gold Standard (2022-01-13 00:00:00)

## 2023-11-29 NOTE — Progress Notes (Signed)
 Patient seen by Dr. Thornton Papas today  Vitals are within treatment parameters:Yes   Labs are within treatment parameters: No (Please specify and give further instructions.)  Total bili 6.0--Ok to proceed  Treatment plan has been signed: Yes   Per physician team, Patient is ready for treatment and there are NO modifications to the treatment plan.

## 2023-11-29 NOTE — Progress Notes (Signed)
 Charles Lawson   Diagnosis: Ampullary carcinoma  INTERVAL HISTORY:   Charles Lawson was discharged in the hospital 11/21/2023 after admission with biliary obstruction.  He underwent placement of a external/internal biliary drain on 11/19/2023.  He reports anorexia and malaise.  No pruritus or fever.  He is scheduled to begin cycle 6 of adjuvant chemotherapy today.  Objective:  Vital signs in last 24 hours:  Blood pressure 119/83, pulse 100, temperature 98.2 F (36.8 C), temperature source Temporal, resp. rate 18, height 5\' 11"  (1.803 m), weight 160 lb 1.6 oz (72.6 kg), SpO2 100%.    HEENT: Scleral icterus, no thrush or ulcer Resp: Lungs clear bilaterally Cardio: Regular rate and rhythm GI: No hepatosplenomegaly, right upper quadrant biliary drain site without evidence of infection, no mass Vascular: No leg edema  Skin: Jaundice  Portacath/PICC-without erythema  Lab Results:  Lab Results  Component Value Date   WBC 10.0 11/29/2023   HGB 15.2 11/29/2023   HCT 45.3 11/29/2023   MCV 94.0 11/29/2023   PLT 309 11/29/2023   NEUTROABS 6.5 11/29/2023    CMP  Lab Results  Component Value Date   NA 135 11/29/2023   K 4.2 11/29/2023   CL 101 11/29/2023   CO2 26 11/29/2023   GLUCOSE 135 (H) 11/29/2023   BUN 17 11/29/2023   CREATININE 0.82 11/29/2023   CALCIUM 9.2 11/29/2023   PROT 7.1 11/29/2023   ALBUMIN 3.8 11/29/2023   AST 39 11/29/2023   ALT 27 11/29/2023   ALKPHOS 200 (H) 11/29/2023   BILITOT 6.0 (HH) 11/29/2023   GFRNONAA >60 11/29/2023   GFRAA  11/19/2008    >60        The eGFR has been calculated using the MDRD equation. This calculation has not been validated in all clinical situations. eGFR's persistently <60 mL/min signify possible Chronic Kidney Disease.     Medications: I have reviewed the patient's current medications.   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 (pT3b,pN1), lymphovascular and perineural invasion present, negative resection margins-closest margins or anterior and posterior peripancreatic soft tissue surfaces at 0.3 cm Elevated CA 19-9 Cycle 1 day 1 gemcitabine/capecitabine 06/23/2023, day 15 gemcitabine 07/06/2023 Cycle 2-day 1 gemcitabine/capecitabine 07/21/2023, day 15 gemcitabine 08/04/2023 Cycle 3-day 1 gemcitabine 08/18/2023 (capecitabine 08/20/2023 dose reduced), day 15 gemcitabine 09/01/2023 Cycle 4-day 1 gemcitabine/capecitabine 09/20/2023, day 15 gemcitabine 10/04/2023 CT abdomen/pelvis 10/09/2023:-No evidence of recurrent pancreatic cancer, mild enhancement of the common hepatic duct leading up to the enteric anastomosis, mild biliary duct dilation in the left hepatic lobe Cycle 5-day 1 gemcitabine/capecitabine 11/01/2023; day 15 Gemcitabine held 11/15/2023 due to elevated liver enzymes, last capecitabine given a.m. 11/17/2023 Cycle 6-1 gemcitabine/capecitabine 11/29/2023 Duodenal obstruction August 2024 secondary to #1 H. pylori gastritis 05/10/2023-we will confirm treatment plan with Dr. 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  L2-L3 mild spinal canal stenosis.  L3-L4 mild to moderate left neural foraminal narrowing.  L5-S1 mild bilateral neural foraminal narrowing.  Narrowing of the left lateral recess at L1-L2. Elevated liver enzymes January 2025 MRI/MRCP 10/22/2023: No evidence of metastatic disease, mild intrahepatic biliary dilation, uniform thickening of the common bile duct with enhancement Liver enzymes normal 10/27/2023 7.  Admission 11/17/2023 with obstructive jaundice/cholangitis CT abdomen/pelvis 11/17/2023: Increased Intermatic biliary duct dilation terminating in prominent soft tissue in the porta hepatis MRI abdomen/MRCP 11/18/2023-progressive moderate intrahepatic and  extrahepatic  biliary duct dilation tapering at the common duct anastomosis, persistent spiculated soft tissue at the anastomotic site 11/19/2023-image guided percutaneous cholangiogram with internal/external biliary drain placement,     Disposition: Charles Lawson was admitted with biliary obstruction.  He underwent percutaneous biliary drain placement 11/19/2023.  The bilirubin remains elevated.  I contacted gastroenterology and interventional radiology.  He will be referred for a drain study.  His performance status appears improved.  The plan is to proceed with a final course of adjuvant gemcitabine/capecitabine.  The hyperbilirubinemia appears to be related to biliary obstruction as opposed to toxicity from chemotherapy.  He will begin capecitabine today.  He will complete day 1 gemcitabine today.  Charles Lawson will return for an office visit and chemotherapy in 2 weeks.  Thornton Papas, MD  11/29/2023  10:30 AM

## 2023-11-29 NOTE — Progress Notes (Signed)
 Mrs. Coey called to inquire if MD had sent in script for medication for his thush. Reached out to pharmacy to confirm dosing with total bili of 6.0

## 2023-11-29 NOTE — Progress Notes (Signed)
 CRITICAL VALUE STICKER  CRITICAL VALUE:total bilirubin 6.0  RECEIVER (on-site recipient of call):Nigeria Lasseter   DATE & TIME NOTIFIED: 11/29/23 1012  MESSENGER (representative from lab): Drucie Ip  MD NOTIFIED: GBS  TIME OF NOTIFICATION:1013  RESPONSE:  in the office to be assess by the provider

## 2023-11-29 NOTE — Patient Instructions (Signed)

## 2023-12-01 ENCOUNTER — Other Ambulatory Visit (HOSPITAL_COMMUNITY): Payer: Self-pay

## 2023-12-01 ENCOUNTER — Other Ambulatory Visit: Payer: Self-pay

## 2023-12-01 NOTE — Progress Notes (Signed)
 Specialty Pharmacy Refill Coordination Note  Charles Lawson is a 83 y.o. male contacted today regarding refills of specialty medication(s) Capecitabine (XELODA)   Patient requested [name] at [facility] at [place] date: 12/02/23   Medication will be filled on 12/01/23 at [facility].

## 2023-12-02 ENCOUNTER — Ambulatory Visit (HOSPITAL_COMMUNITY)
Admission: RE | Admit: 2023-12-02 | Discharge: 2023-12-02 | Disposition: A | Discharge status: Home / Self Care | Source: Ambulatory Visit | Attending: Radiology | Admitting: Radiology

## 2023-12-02 ENCOUNTER — Other Ambulatory Visit (HOSPITAL_COMMUNITY): Payer: Self-pay | Admitting: Interventional Radiology

## 2023-12-02 ENCOUNTER — Other Ambulatory Visit (HOSPITAL_COMMUNITY): Payer: Self-pay

## 2023-12-02 ENCOUNTER — Other Ambulatory Visit: Payer: Self-pay | Admitting: Radiology

## 2023-12-02 DIAGNOSIS — Z434 Encounter for attention to other artificial openings of digestive tract: Secondary | ICD-10-CM | POA: Diagnosis not present

## 2023-12-02 DIAGNOSIS — K831 Obstruction of bile duct: Secondary | ICD-10-CM | POA: Insufficient documentation

## 2023-12-02 HISTORY — PX: IR INT EXT BILIARY DRAIN WITH CHOLANGIOGRAM: IMG6044

## 2023-12-02 MED ORDER — IOHEXOL 300 MG/ML  SOLN
50.0000 mL | Freq: Once | INTRAMUSCULAR | Status: AC | PRN
  Administered 2023-12-02: 10 mL

## 2023-12-02 MED ORDER — FENTANYL CITRATE (PF) 100 MCG/2ML IJ SOLN
INTRAMUSCULAR | Status: DC | PRN
  Administered 2023-12-02: 50 ug via INTRAVENOUS

## 2023-12-02 MED ORDER — HEPARIN SOD (PORK) LOCK FLUSH 100 UNIT/ML IV SOLN
INTRAVENOUS | Status: AC
  Filled 2023-12-02: qty 5

## 2023-12-02 MED ORDER — FENTANYL CITRATE (PF) 100 MCG/2ML IJ SOLN
INTRAMUSCULAR | Status: AC
  Filled 2023-12-02: qty 2

## 2023-12-02 MED ORDER — LORAZEPAM 0.5 MG PO TABS
0.5000 mg | ORAL_TABLET | Freq: Once | ORAL | Status: AC
  Administered 2023-12-02: 0.5 mg via SUBLINGUAL
  Filled 2023-12-02: qty 1

## 2023-12-02 MED ORDER — LIDOCAINE HCL 1 % IJ SOLN
INTRAMUSCULAR | Status: AC
  Filled 2023-12-02: qty 20

## 2023-12-02 MED ORDER — LIDOCAINE HCL 1 % IJ SOLN
10.0000 mL | Freq: Once | INTRAMUSCULAR | Status: AC
  Administered 2023-12-02: 2 mL via INTRADERMAL

## 2023-12-02 MED ORDER — SODIUM CHLORIDE 0.9 % IV SOLN
2.0000 g | Freq: Once | INTRAVENOUS | Status: AC
  Administered 2023-12-02: 2 g via INTRAVENOUS

## 2023-12-02 MED ORDER — SODIUM CHLORIDE 0.9 % IV SOLN
INTRAVENOUS | Status: AC
  Filled 2023-12-02: qty 20

## 2023-12-02 NOTE — Progress Notes (Signed)
 Patient discharged home with wife. Vital sings stable. Patient ambulatory at discharge, no further questions at this time.

## 2023-12-12 ENCOUNTER — Other Ambulatory Visit: Payer: Self-pay | Admitting: Oncology

## 2023-12-13 ENCOUNTER — Other Ambulatory Visit: Payer: Self-pay | Admitting: Nurse Practitioner

## 2023-12-13 ENCOUNTER — Inpatient Hospital Stay: Payer: Medicare Other

## 2023-12-13 ENCOUNTER — Inpatient Hospital Stay (HOSPITAL_BASED_OUTPATIENT_CLINIC_OR_DEPARTMENT_OTHER): Payer: Medicare Other | Admitting: Nurse Practitioner

## 2023-12-13 ENCOUNTER — Encounter: Payer: Self-pay | Admitting: Nurse Practitioner

## 2023-12-13 VITALS — BP 108/63 | HR 66 | Temp 98.1°F | Resp 16

## 2023-12-13 VITALS — BP 131/74 | HR 93 | Temp 98.2°F | Resp 18 | Ht 71.0 in | Wt 167.9 lb

## 2023-12-13 DIAGNOSIS — K8309 Other cholangitis: Secondary | ICD-10-CM | POA: Diagnosis not present

## 2023-12-13 DIAGNOSIS — C241 Malignant neoplasm of ampulla of Vater: Secondary | ICD-10-CM

## 2023-12-13 DIAGNOSIS — B9681 Helicobacter pylori [H. pylori] as the cause of diseases classified elsewhere: Secondary | ICD-10-CM | POA: Diagnosis not present

## 2023-12-13 DIAGNOSIS — R5381 Other malaise: Secondary | ICD-10-CM | POA: Diagnosis not present

## 2023-12-13 DIAGNOSIS — R630 Anorexia: Secondary | ICD-10-CM | POA: Diagnosis not present

## 2023-12-13 DIAGNOSIS — Z5111 Encounter for antineoplastic chemotherapy: Secondary | ICD-10-CM | POA: Diagnosis not present

## 2023-12-13 LAB — CBC WITH DIFFERENTIAL (CANCER CENTER ONLY)
Abs Immature Granulocytes: 0.01 10*3/uL (ref 0.00–0.07)
Basophils Absolute: 0 10*3/uL (ref 0.0–0.1)
Basophils Relative: 0 %
Eosinophils Absolute: 0.1 10*3/uL (ref 0.0–0.5)
Eosinophils Relative: 2 %
HCT: 38.8 % — ABNORMAL LOW (ref 39.0–52.0)
Hemoglobin: 13.1 g/dL (ref 13.0–17.0)
Immature Granulocytes: 0 %
Lymphocytes Relative: 28 %
Lymphs Abs: 1.5 10*3/uL (ref 0.7–4.0)
MCH: 32.4 pg (ref 26.0–34.0)
MCHC: 33.8 g/dL (ref 30.0–36.0)
MCV: 96 fL (ref 80.0–100.0)
Monocytes Absolute: 0.7 10*3/uL (ref 0.1–1.0)
Monocytes Relative: 13 %
Neutro Abs: 3.1 10*3/uL (ref 1.7–7.7)
Neutrophils Relative %: 57 %
Platelet Count: 166 10*3/uL (ref 150–400)
RBC: 4.04 MIL/uL — ABNORMAL LOW (ref 4.22–5.81)
RDW: 17.4 % — ABNORMAL HIGH (ref 11.5–15.5)
WBC Count: 5.5 10*3/uL (ref 4.0–10.5)
nRBC: 0 % (ref 0.0–0.2)

## 2023-12-13 LAB — CMP (CANCER CENTER ONLY)
ALT: 33 U/L (ref 0–44)
AST: 42 U/L — ABNORMAL HIGH (ref 15–41)
Albumin: 3.4 g/dL — ABNORMAL LOW (ref 3.5–5.0)
Alkaline Phosphatase: 172 U/L — ABNORMAL HIGH (ref 38–126)
Anion gap: 5 (ref 5–15)
BUN: 11 mg/dL (ref 8–23)
CO2: 28 mmol/L (ref 22–32)
Calcium: 8.9 mg/dL (ref 8.9–10.3)
Chloride: 105 mmol/L (ref 98–111)
Creatinine: 0.72 mg/dL (ref 0.61–1.24)
GFR, Estimated: >60 mL/min (ref >60)
Glucose, Bld: 133 mg/dL — ABNORMAL HIGH (ref 70–99)
Potassium: 4.1 mmol/L (ref 3.5–5.1)
Sodium: 138 mmol/L (ref 135–145)
Total Bilirubin: 3.2 mg/dL — ABNORMAL HIGH (ref 0.0–1.2)
Total Protein: 6.1 g/dL — ABNORMAL LOW (ref 6.5–8.1)

## 2023-12-13 MED ORDER — SODIUM CHLORIDE 0.9% FLUSH
10.0000 mL | INTRAVENOUS | Status: DC | PRN
Start: 2023-12-13 — End: 2023-12-13
  Administered 2023-12-13: 10 mL

## 2023-12-13 MED ORDER — HEPARIN SOD (PORK) LOCK FLUSH 100 UNIT/ML IV SOLN
500.0000 [IU] | Freq: Once | INTRAVENOUS | Status: AC | PRN
  Administered 2023-12-13: 500 [IU]

## 2023-12-13 MED ORDER — SODIUM CHLORIDE 0.9 % IV SOLN
2000.0000 mg | Freq: Once | INTRAVENOUS | Status: AC
  Administered 2023-12-13: 2000 mg via INTRAVENOUS
  Filled 2023-12-13: qty 52.6

## 2023-12-13 MED ORDER — PANCRELIPASE (LIP-PROT-AMYL) 12000-38000 UNITS PO CPEP: 12000.0000 [IU] | ORAL_CAPSULE | Freq: Three times a day (TID) | ORAL | 1 refills | Status: DC

## 2023-12-13 MED ORDER — PROCHLORPERAZINE MALEATE 10 MG PO TABS
10.0000 mg | ORAL_TABLET | Freq: Once | ORAL | Status: AC
  Administered 2023-12-13: 10 mg via ORAL
  Filled 2023-12-13: qty 1

## 2023-12-13 MED ORDER — SODIUM CHLORIDE 0.9 % IV SOLN: Freq: Once | INTRAVENOUS | Status: AC

## 2023-12-13 NOTE — Progress Notes (Signed)
 Patient seen by Lonna Cobb NP today  Vitals are within treatment parameters:Yes   Labs are within treatment parameters: Yes total bilirubin 3.2  Treatment plan has been signed: Yes   Per physician team, Patient is ready for treatment and there are NO modifications to the treatment plan.

## 2023-12-13 NOTE — Progress Notes (Signed)
 Patient presents today for chemotherapy infusion. Patient is in satisfactory condition with no new complaints voiced.  Vital signs are stable.  Labs reviewed by Lonna Cobb NP during the office visit and all labs are within treatment parameters with exception of Bilirubin 3.2. Per Lonna Cobb NP okay to treat. We will proceed with treatment per NP/MD orders.   Patient tolerated treatment well with no complaints voiced.  Patient left ambulatory in stable condition.  Vital signs stable at discharge.  Follow up as scheduled.

## 2023-12-13 NOTE — Patient Instructions (Signed)
 [facility]  Discharge Instructions: Thank you for choosing [facility] to provide your oncology and hematology care.   If you have a lab appointment with the Cancer Center, please go directly to the Cancer Center and check in at the registration area.   Wear comfortable clothing and clothing appropriate for easy access to any Portacath or PICC line.   We strive to give you quality time with your provider. You may need to reschedule your appointment if you arrive late (15 or more minutes).  Arriving late affects you and other patients whose appointments are after yours.  Also, if you miss three or more appointments without notifying the office, you may be dismissed from the clinic at the provider's discretion.      For prescription refill requests, have your pharmacy contact our office and allow 72 hours for refills to be completed.    Today you received the following chemotherapy and/or immunotherapy agents Gemzar.  Gemcitabine Injection What is this medication? GEMCITABINE (jem SYE ta been) treats some types of cancer. It works by slowing down the growth of cancer cells. This medicine may be used for other purposes; ask your health care provider or pharmacist if you have questions. COMMON BRAND NAME(S): Gemzar, Infugem What should I tell my care team before I take this medication? They need to know if you have any of these conditions: Blood disorders Infection Kidney disease Liver disease Lung or breathing disease, such as asthma or COPD Recent or ongoing radiation therapy An unusual or allergic reaction to gemcitabine, other medications, foods, dyes, or preservatives If you or your partner are pregnant or trying to get pregnant Breast-feeding How should I use this medication? This medication is injected into a vein. It is given by your care team in a hospital or clinic setting. Talk to your care team about the  use of this medication in children. Special care may be needed. Overdosage: If you think you have taken too much of this medicine contact a poison control center or emergency room at once. NOTE: This medicine is only for you. Do not share this medicine with others. What if I miss a dose? Keep appointments for follow-up doses. It is important not to miss your dose. Call your care team if you are unable to keep an appointment. What may interact with this medication? Interactions have not been studied. This list may not describe all possible interactions. Give your health care provider a list of all the medicines, herbs, non-prescription drugs, or dietary supplements you use. Also tell them if you smoke, drink alcohol, or use illegal drugs. Some items may interact with your medicine. What should I watch for while using this medication? Your condition will be monitored carefully while you are receiving this medication. This medication may make you feel generally unwell. This is not uncommon, as chemotherapy can affect healthy cells as well as cancer cells. Report any side effects. Continue your course of treatment even though you feel ill unless your care team tells you to stop. In some cases, you may be given additional medications to help with side effects. Follow all directions for their use. This medication may increase your risk of getting an infection. Call your care team for advice if you get a fever, chills, sore throat, or other symptoms of a cold or flu. Do not treat yourself. Try to avoid being around people who are sick. This medication may increase  your risk to bruise or bleed. Call your care team if you notice any unusual bleeding. Be careful brushing or flossing your teeth or using a toothpick because you may get an infection or bleed more easily. If you have any dental work done, tell your dentist you are receiving this medication. Avoid taking medications that contain aspirin,  acetaminophen, ibuprofen, naproxen, or ketoprofen unless instructed by your care team. These medications may hide a fever. Talk to your care team if you or your partner wish to become pregnant or think you might be pregnant. This medication can cause serious birth defects if taken during pregnancy and for 6 months after the last dose. A negative pregnancy test is required before starting this medication. A reliable form of contraception is recommended while taking this medication and for 6 months after the last dose. Talk to your care team about effective forms of contraception. Do not father a child while taking this medication and for 3 months after the last dose. Use a condom while having sex during this time period. Do not breastfeed while taking this medication and for at least 1 week after the last dose. This medication may cause infertility. Talk to your care team if you are concerned about your fertility. What side effects may I notice from receiving this medication? Side effects that you should report to your care team as soon as possible: Allergic reactions--skin rash, itching, hives, swelling of the face, lips, tongue, or throat Capillary leak syndrome--stomach or muscle pain, unusual weakness or fatigue, feeling faint or lightheaded, decrease in the amount of urine, swelling of the ankles, hands, or feet, trouble breathing Infection--fever, chills, cough, sore throat, wounds that don't heal, pain or trouble when passing urine, general feeling of discomfort or being unwell Liver injury--right upper belly pain, loss of appetite, nausea, light-colored stool, dark yellow or brown urine, yellowing skin or eyes, unusual weakness or fatigue Low red blood cell level--unusual weakness or fatigue, dizziness, headache, trouble breathing Lung injury--shortness of breath or trouble breathing, cough, spitting up blood, chest pain, fever Stomach pain, bloody diarrhea, pale skin, unusual weakness or fatigue,  decrease in the amount of urine, which may be signs of hemolytic uremic syndrome Sudden and severe headache, confusion, change in vision, seizures, which may be signs of posterior reversible encephalopathy syndrome (PRES) Unusual bruising or bleeding Side effects that usually do not require medical attention (report to your care team if they continue or are bothersome): Diarrhea Drowsiness Hair loss Nausea Pain, redness, or swelling with sores inside the mouth or throat Vomiting This list may not describe all possible side effects. Call your doctor for medical advice about side effects. You may report side effects to FDA at 1-800-FDA-1088. Where should I keep my medication? This medication is given in a hospital or clinic. It will not be stored at home. NOTE: This sheet is a summary. It may not cover all possible information. If you have questions about this medicine, talk to your doctor, pharmacist, or health care provider.  2024 Elsevier/Gold Standard (2022-01-13 00:00:00)      To help prevent nausea and vomiting after your treatment, we encourage you to take your nausea medication as directed.  BELOW ARE SYMPTOMS THAT SHOULD BE REPORTED IMMEDIATELY: *FEVER GREATER THAN 100.4 F (38 C) OR HIGHER *CHILLS OR SWEATING *NAUSEA AND VOMITING THAT IS NOT CONTROLLED WITH YOUR NAUSEA MEDICATION *UNUSUAL SHORTNESS OF BREATH *UNUSUAL BRUISING OR BLEEDING *URINARY PROBLEMS (pain or burning when urinating, or frequent urination) *BOWEL PROBLEMS (unusual diarrhea,  constipation, pain near the anus) TENDERNESS IN MOUTH AND THROAT WITH OR WITHOUT PRESENCE OF ULCERS (sore throat, sores in mouth, or a toothache) UNUSUAL RASH, SWELLING OR PAIN  UNUSUAL VAGINAL DISCHARGE OR ITCHING   Items with * indicate a potential emergency and should be followed up as soon as possible or go to the Emergency Department if any problems should occur.  Please show the CHEMOTHERAPY ALERT CARD or IMMUNOTHERAPY ALERT CARD  at check-in to the Emergency Department and triage nurse.  Should you have questions after your visit or need to cancel or reschedule your appointment, please contact [facility] A [facility] OF [facility]  Dept: [phone]  and follow the prompts.  Office hours are 8:00 a.m. to 4:30 p.m. Monday - Friday. Please note that voicemails left after 4:00 p.m. may not be returned until the following business day.  We are closed weekends and major holidays. You have access to a nurse at all times for urgent questions. Please call the main number to the clinic Dept: [phone] and follow the prompts.   For any non-urgent questions, you may also contact your provider using MyChart. We now offer e-Visits for anyone 39 and older to request care online for non-urgent symptoms. For details visit [email or web address].   Also download the MyChart app! Go to the app store, search "MyChart", open the app, select [place], and log in with your MyChart username and password.

## 2023-12-13 NOTE — Progress Notes (Signed)
 [facility] OFFICE PROGRESS NOTE   Diagnosis: Ampullary carcinoma  INTERVAL HISTORY:   Mr. Stoudt returns as scheduled.  He completed cycle 6 gemcitabine/capecitabine 11/29/2023.  He denies nausea/vomiting.  No mouth sores.  No diarrhea.  No hand or foot pain or redness.  No fever or rash.  Objective:  Vital signs in last 24 hours:  Blood pressure 131/74, pulse 93, temperature 98.2 F (36.8 C), resp. rate 18, height 5\' 11"  (1.803 m), weight 167 lb 14.4 oz (76.2 kg), SpO2 99%.    HEENT: No thrush or ulcers.  Mild scleral icterus. Resp: Lungs clear bilaterally. Cardio: Regular rate and rhythm. GI: Abdomen soft and nontender.  No hepatosplenomegaly.  Right upper quadrant drain site without evidence of infection. Vascular: No leg edema. Skin: Palms without erythema.  Soles dry appearing, mild erythema. Portacath without erythema.  Lab Results:  Lab Results  Component Value Date   WBC 5.5 12/13/2023   HGB 13.1 12/13/2023   HCT 38.8 (L) 12/13/2023   MCV 96.0 12/13/2023   PLT 166 12/13/2023   NEUTROABS 3.1 12/13/2023    Imaging:  No results found.  Medications: I have reviewed the patient's current medications.  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,pN1), lymphovascular and perineural invasion present, negative resection margins-closest margins or anterior and posterior peripancreatic soft tissue surfaces at 0.3 cm Elevated CA 19-9 Cycle 1 day 1 gemcitabine/capecitabine 06/23/2023, day 15 gemcitabine 07/06/2023 Cycle 2-day 1 gemcitabine/capecitabine 07/21/2023, day 15 gemcitabine 08/04/2023 Cycle 3-day 1 gemcitabine 08/18/2023 (capecitabine 08/20/2023 dose reduced), day 15 gemcitabine 09/01/2023 Cycle 4-day 1 gemcitabine/capecitabine 09/20/2023, day 15 gemcitabine 10/04/2023 CT abdomen/pelvis 10/09/2023:-No evidence of recurrent pancreatic cancer, mild  enhancement of the common hepatic duct leading up to the enteric anastomosis, mild biliary duct dilation in the left hepatic lobe Cycle 5-day 1 gemcitabine/capecitabine 11/01/2023; day 15 Gemcitabine held 11/15/2023 due to elevated liver enzymes, last capecitabine given a.m. 11/17/2023 Cycle 6-day 1 gemcitabine/capecitabine 11/29/2023, day 15 Gemcitabine 12/13/2023 Duodenal obstruction August 2024 secondary to #1 H. pylori gastritis 05/10/2023-we will confirm treatment plan with Dr. 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  L2-L3 mild spinal canal stenosis.  L3-L4 mild to moderate left neural foraminal narrowing.  L5-S1 mild bilateral neural foraminal narrowing.  Narrowing of the left lateral recess at L1-L2. Elevated liver enzymes January 2025 MRI/MRCP 10/22/2023: No evidence of metastatic disease, mild intrahepatic biliary dilation, uniform thickening of the common bile duct with enhancement Liver enzymes normal 10/27/2023 7.  Admission 11/17/2023 with obstructive jaundice/cholangitis CT abdomen/pelvis 11/17/2023: Increased Intermatic biliary duct dilation terminating in prominent soft tissue in the porta hepatis MRI abdomen/MRCP 11/18/2023-progressive moderate intrahepatic and extrahepatic biliary duct dilation tapering at the common duct anastomosis, persistent spiculated soft tissue at the anastomotic site 11/19/2023-image guided percutaneous cholangiogram with internal/external biliary drain placement Biliary drain exchange 12/02/2023      Disposition: Charles Lawson appears stable.  He is completing cycle 6 gemcitabine/capecitabine.  Plan to proceed with day 15 Gemcitabine today as scheduled.  He will continue capecitabine to complete 21 days.  CBC and chemistry panel reviewed.  Labs adequate to proceed as above.  Bilirubin is improved at 3.2.  Plan for repeat chemistry panel in 2 weeks.  He has  follow-up in the drain clinic on 12/30/2023.  He will return for lab and follow-up in 4 weeks.    Lonna Cobb ANP/GNP-BC  12/13/2023  9:17 AM

## 2023-12-22 ENCOUNTER — Inpatient Hospital Stay: Attending: Nurse Practitioner | Admitting: Nutrition

## 2023-12-22 DIAGNOSIS — Z9221 Personal history of antineoplastic chemotherapy: Secondary | ICD-10-CM | POA: Insufficient documentation

## 2023-12-22 DIAGNOSIS — R748 Abnormal levels of other serum enzymes: Secondary | ICD-10-CM | POA: Insufficient documentation

## 2023-12-22 DIAGNOSIS — Z8589 Personal history of malignant neoplasm of other organs and systems: Secondary | ICD-10-CM | POA: Insufficient documentation

## 2023-12-22 NOTE — Progress Notes (Signed)
 Telephone follow up with patient. Patient completed cycle 6 Gemcitabine/Capecitabine.  Weight 167 pounds 14.4 oz March 24. Weight 170 pounds on Dec 11.  Labs include Glucose 133 and albumin 3.4.  He reports he finished "those pills" and feels well overall. He is not having further chemotherapy. He is eating well. He will continue to drink an ONS occasionally but doesn't need them daily. No questions or concerns. Encouraged patient to reach out to RD if he develops questions or concerns around nutrition.

## 2023-12-27 ENCOUNTER — Inpatient Hospital Stay

## 2023-12-27 DIAGNOSIS — Z8589 Personal history of malignant neoplasm of other organs and systems: Secondary | ICD-10-CM | POA: Diagnosis not present

## 2023-12-27 DIAGNOSIS — Z9221 Personal history of antineoplastic chemotherapy: Secondary | ICD-10-CM | POA: Diagnosis not present

## 2023-12-27 DIAGNOSIS — C241 Malignant neoplasm of ampulla of Vater: Secondary | ICD-10-CM

## 2023-12-27 DIAGNOSIS — R748 Abnormal levels of other serum enzymes: Secondary | ICD-10-CM | POA: Diagnosis not present

## 2023-12-27 LAB — CMP (CANCER CENTER ONLY)
ALT: 26 U/L (ref 0–44)
AST: 37 U/L (ref 15–41)
Albumin: 3.6 g/dL (ref 3.5–5.0)
Alkaline Phosphatase: 184 U/L — ABNORMAL HIGH (ref 38–126)
Anion gap: 5 (ref 5–15)
BUN: 12 mg/dL (ref 8–23)
CO2: 28 mmol/L (ref 22–32)
Calcium: 9 mg/dL (ref 8.9–10.3)
Chloride: 105 mmol/L (ref 98–111)
Creatinine: 0.69 mg/dL (ref 0.61–1.24)
GFR, Estimated: >60 mL/min (ref >60)
Glucose, Bld: 112 mg/dL — ABNORMAL HIGH (ref 70–99)
Potassium: 4.3 mmol/L (ref 3.5–5.1)
Sodium: 138 mmol/L (ref 135–145)
Total Bilirubin: 1.7 mg/dL — ABNORMAL HIGH (ref 0.0–1.2)
Total Protein: 6.5 g/dL (ref 6.5–8.1)

## 2023-12-28 ENCOUNTER — Ambulatory Visit: Payer: Medicare Other | Admitting: Internal Medicine

## 2023-12-29 ENCOUNTER — Other Ambulatory Visit: Payer: Self-pay | Admitting: Radiology

## 2023-12-29 DIAGNOSIS — K831 Obstruction of bile duct: Secondary | ICD-10-CM

## 2023-12-30 ENCOUNTER — Other Ambulatory Visit (HOSPITAL_COMMUNITY)

## 2023-12-30 ENCOUNTER — Encounter (HOSPITAL_COMMUNITY): Payer: Self-pay

## 2023-12-30 ENCOUNTER — Ambulatory Visit (HOSPITAL_COMMUNITY)
Admission: RE | Admit: 2023-12-30 | Discharge: 2023-12-30 | Disposition: A | Discharge status: Home / Self Care | Source: Ambulatory Visit | Attending: Interventional Radiology | Admitting: Interventional Radiology

## 2023-12-30 ENCOUNTER — Other Ambulatory Visit: Payer: Self-pay

## 2023-12-30 DIAGNOSIS — Z8509 Personal history of malignant neoplasm of other digestive organs: Secondary | ICD-10-CM | POA: Insufficient documentation

## 2023-12-30 DIAGNOSIS — E785 Hyperlipidemia, unspecified: Secondary | ICD-10-CM | POA: Insufficient documentation

## 2023-12-30 DIAGNOSIS — N400 Enlarged prostate without lower urinary tract symptoms: Secondary | ICD-10-CM | POA: Diagnosis not present

## 2023-12-30 DIAGNOSIS — K831 Obstruction of bile duct: Secondary | ICD-10-CM | POA: Diagnosis not present

## 2023-12-30 DIAGNOSIS — Z434 Encounter for attention to other artificial openings of digestive tract: Secondary | ICD-10-CM | POA: Insufficient documentation

## 2023-12-30 HISTORY — PX: IR EXCHANGE BILIARY DRAIN: IMG6046

## 2023-12-30 LAB — COMPREHENSIVE METABOLIC PANEL WITH GFR
ALT: 29 U/L (ref 0–44)
AST: 40 U/L (ref 15–41)
Albumin: 3.3 g/dL — ABNORMAL LOW (ref 3.5–5.0)
Alkaline Phosphatase: 165 U/L — ABNORMAL HIGH (ref 38–126)
Anion gap: 7 (ref 5–15)
BUN: 14 mg/dL (ref 8–23)
CO2: 24 mmol/L (ref 22–32)
Calcium: 9.1 mg/dL (ref 8.9–10.3)
Chloride: 106 mmol/L (ref 98–111)
Creatinine, Ser: 0.77 mg/dL (ref 0.61–1.24)
GFR, Estimated: >60 mL/min (ref >60)
Glucose, Bld: 103 mg/dL — ABNORMAL HIGH (ref 70–99)
Potassium: 4 mmol/L (ref 3.5–5.1)
Sodium: 137 mmol/L (ref 135–145)
Total Bilirubin: 1.9 mg/dL — ABNORMAL HIGH (ref 0.0–1.2)
Total Protein: 6.6 g/dL (ref 6.5–8.1)

## 2023-12-30 LAB — CBC WITH DIFFERENTIAL/PLATELET
Abs Immature Granulocytes: 0.02 10*3/uL (ref 0.00–0.07)
Basophils Absolute: 0 10*3/uL (ref 0.0–0.1)
Basophils Relative: 0 %
Eosinophils Absolute: 0.1 10*3/uL (ref 0.0–0.5)
Eosinophils Relative: 2 %
HCT: 41.2 % (ref 39.0–52.0)
Hemoglobin: 13.7 g/dL (ref 13.0–17.0)
Immature Granulocytes: 0 %
Lymphocytes Relative: 33 %
Lymphs Abs: 2.1 10*3/uL (ref 0.7–4.0)
MCH: 32.3 pg (ref 26.0–34.0)
MCHC: 33.3 g/dL (ref 30.0–36.0)
MCV: 97.2 fL (ref 80.0–100.0)
Monocytes Absolute: 0.8 10*3/uL (ref 0.1–1.0)
Monocytes Relative: 12 %
Neutro Abs: 3.3 10*3/uL (ref 1.7–7.7)
Neutrophils Relative %: 53 %
Platelets: 215 10*3/uL (ref 150–400)
RBC: 4.24 MIL/uL (ref 4.22–5.81)
RDW: 17.2 % — ABNORMAL HIGH (ref 11.5–15.5)
WBC: 6.2 10*3/uL (ref 4.0–10.5)
nRBC: 0 % (ref 0.0–0.2)

## 2023-12-30 LAB — PROTIME-INR
INR: 1.1 (ref 0.8–1.2)
Prothrombin Time: 14.4 s (ref 11.4–15.2)

## 2023-12-30 MED ORDER — MIDAZOLAM HCL 2 MG/2ML IJ SOLN
INTRAMUSCULAR | Status: AC
  Filled 2023-12-30: qty 6

## 2023-12-30 MED ORDER — LIDOCAINE HCL 1 % IJ SOLN
20.0000 mL | Freq: Once | INTRAMUSCULAR | Status: AC
  Administered 2023-12-30: 3 mL via INTRADERMAL

## 2023-12-30 MED ORDER — FENTANYL CITRATE (PF) 100 MCG/2ML IJ SOLN
INTRAMUSCULAR | Status: AC
  Filled 2023-12-30: qty 2

## 2023-12-30 MED ORDER — LEVOFLOXACIN IN D5W 500 MG/100ML IV SOLN
500.0000 mg | Freq: Once | INTRAVENOUS | Status: AC
  Administered 2023-12-30: 500 mg via INTRAVENOUS
  Filled 2023-12-30: qty 100

## 2023-12-30 MED ORDER — SODIUM CHLORIDE 0.9% FLUSH: 5.0000 mL | Freq: Three times a day (TID) | INTRAVENOUS | Status: DC

## 2023-12-30 MED ORDER — MIDAZOLAM HCL 2 MG/2ML IJ SOLN
INTRAMUSCULAR | Status: AC | PRN
  Administered 2023-12-30: 1 mg via INTRAVENOUS

## 2023-12-30 MED ORDER — FENTANYL CITRATE (PF) 100 MCG/2ML IJ SOLN
INTRAMUSCULAR | Status: AC | PRN
  Administered 2023-12-30: 50 ug via INTRAVENOUS

## 2023-12-30 MED ORDER — DIPHENHYDRAMINE HCL 50 MG/ML IJ SOLN
INTRAMUSCULAR | Status: AC | PRN
  Administered 2023-12-30: 25 mg via INTRAVENOUS

## 2023-12-30 MED ORDER — DIPHENHYDRAMINE HCL 50 MG/ML IJ SOLN
INTRAMUSCULAR | Status: AC
  Filled 2023-12-30: qty 1

## 2023-12-30 MED ORDER — SODIUM CHLORIDE 0.9 % IV SOLN: INTRAVENOUS | Status: DC

## 2023-12-30 MED ORDER — IOHEXOL 300 MG/ML  SOLN
50.0000 mL | Freq: Once | INTRAMUSCULAR | Status: AC | PRN
  Administered 2023-12-30: 20 mL

## 2023-12-30 NOTE — Procedures (Signed)
 Interventional Radiology Procedure Note  Procedure: Biliary drain exchange  Findings: Please refer to procedural dictation for full description. 12 Fr internal/external biliary drain, capped.  Complications: None immediate  Estimated Blood Loss: < 5 ml  Recommendations: Keep capped. IR will arrange 4-6 week follow up.  Will discuss case with Dr. Truett Perna for consideration of removal vs. internal stent placement.   Marliss Coots, MD

## 2023-12-30 NOTE — H&P (Signed)
 Chief Complaint:   Referring Provider(s):   Supervising Physician: Marliss Coots  Patient Status: [facility] - Out-pt  History of Present Illness: Charles Lawson is an 83 y.o. male with past medical history significant for arthritis, BPH, diverticulosis, hyperlipidemia, and ampullary cancer.  He is status post Whipple in August 2024 and right internal/external biliary drain placement on 11/19/2023 with last exchange on 12/02/2023.  He presents today for follow-up cholangiogram with possible biliary drain exchange/upsizing or removal.  Total bilirubin on 12/27/23 was 1.7 , today's result 1.9.   Patient is Full Code  Past Medical History:  Diagnosis Date   Allergic rhinitis, cause unspecified    Arthritis    Blood pressure elevated without history of HTN    BPH (benign prostatic hyperplasia)    Diverticulosis of colon    ED (erectile dysfunction)    Helicobacter pylori gastritis 04/2023   Tx quad tx and eradication documented neg stool ag 07/2023   History of basal cell carcinoma (BCC) excision    Hyperlipidemia    Impaired glucose tolerance 04/03/2013   Personal history of colonic polyps 06/07/2012    Past Surgical History:  Procedure Laterality Date   Quintella Reichert OSTEOTOMY Left 07/19/2020   Procedure: Quintella Reichert OSTEOTOMY;  Surgeon: Edwin Cap, DPM;  Location: [facility];  Service: Podiatry;  Laterality: Left;   BIOPSY  05/10/2023   Procedure: BIOPSY;  Surgeon: Iva Boop, MD;  Location: [facility];  Service: Gastroenterology;;   BIOPSY  05/12/2023   Procedure: BIOPSY;  Surgeon: Iva Boop, MD;  Location: [facility];  Service: Gastroenterology;;   Arbutus Leas Left 07/19/2020   Procedure: Delos Haring;  Surgeon: Edwin Cap, DPM;  Location: [facility];  Service: Podiatry;  Laterality: Left;   COLONOSCOPY  last one 10-06-2017  dr Leone Payor   ESOPHAGOGASTRODUODENOSCOPY N/A 05/12/2023   Procedure: ESOPHAGOGASTRODUODENOSCOPY (EGD);   Surgeon: Iva Boop, MD;  Location: [facility];  Service: Gastroenterology;  Laterality: N/A;   ESOPHAGOGASTRODUODENOSCOPY (EGD) WITH PROPOFOL N/A 05/10/2023   Procedure: ESOPHAGOGASTRODUODENOSCOPY (EGD) WITH PROPOFOL;  Surgeon: Iva Boop, MD;  Location: [facility];  Service: Gastroenterology;  Laterality: N/A;   HAMMER TOE SURGERY Left 07/19/2020   Procedure: HAMMER TOE CORRECTION;  Surgeon: Edwin Cap, DPM;  Location: [facility];  Service: Podiatry;  Laterality: Left;   INGUINAL HERNIA REPAIR Right 11-21-2008  @ [facility]   IR IMAGING GUIDED PORT INSERTION  06/18/2023   IR INT EXT BILIARY DRAIN WITH CHOLANGIOGRAM  11/19/2023   IR INT EXT BILIARY DRAIN WITH CHOLANGIOGRAM  12/02/2023   LUMBAR LAMINECTOMY  1974   METATARSAL OSTEOTOMY Left 07/19/2020   Procedure: METATARSAL OSTEOTOMY;  Surgeon: Edwin Cap, DPM;  Location: [facility];  Service: Podiatry;  Laterality: Left;   ORIF TOE FRACTURE Left 07/19/2020   Procedure: OPEN TREATMENT OF METARSAL PHALANGEAL JOINT DISLOCATION;  Surgeon: Edwin Cap, DPM;  Location: [facility] [place];  Service: Podiatry;  Laterality: Left;  ANKLE BLOCK WITH THIGH TOURNIQUET    Allergies: Penicillins  Medications: Prior to Admission medications   Medication Sig Start Date End Date Taking? Authorizing Provider  Cyanocobalamin (VITAMIN B-12 PO) Take 1 tablet by mouth at bedtime.   Yes [provider]  lipase/protease/amylase (CREON) 12000-38000 units CPEP capsule TAKE 1 CAPSULE (12,000 UNITS TOTAL) BY MOUTH 3 (THREE) TIMES DAILY BEFORE MEALS. 12/13/23  Yes Ladene Artist, MD  omeprazole (PRILOSEC) 20 MG capsule Take 1 capsule (20 mg total) by  mouth daily. 08/16/23  Yes Corwin Levins, MD  potassium chloride (KLOR-CON M) 10 MEQ tablet Take 1 tablet (10 mEq total) by mouth 2 (two) times daily. 08/23/23  Yes Ladene Artist, MD  tamsulosin (FLOMAX) 0.4 MG CAPS capsule Take 1 capsule (0.4 mg  total) by mouth daily. 05/27/23: reports during [facility] call taking after recent surgery at [facility] 11/22/23  Yes Corwin Levins, MD  acetaminophen (TYLENOL) 650 MG CR tablet Take 500 mg by mouth every 8 (eight) hours as needed for pain. 05/27/23: reports during [facility] call taking after recent surgery at [facility] Patient not taking: Reported on 11/29/2023    Corwin Levins, MD  capecitabine (XELODA) 500 MG tablet Take #1 tablet (500 mg) in am and #2 tablets (1000 mg) in pm. Take for 21 days, then hold for 7 days. Repeat every 28 days. Start cycle on 11/29/2023 11/15/23   Rana Snare, NP  fluconazole (DIFLUCAN) 100 MG tablet Take 1 tablet (100 mg total) by mouth daily. 11/29/23   Ladene Artist, MD  lidocaine-prilocaine (EMLA) cream Apply 1 Application topically as needed. 11/15/23   Rana Snare, NP  ondansetron (ZOFRAN) 8 MG tablet Take 1 tablet (8 mg total) by mouth every 8 (eight) hours as needed for nausea. 11/04/23   Ladene Artist, MD  oxycodone (OXY-IR) 5 MG capsule Take 5 mg by mouth every 6 (six) hours as needed for pain. Patient not taking: Reported on 12/13/2023    [provider]  prochlorperazine (COMPAZINE) 10 MG tablet Take 1 tablet (10 mg total) by mouth every 6 (six) hours as needed for nausea or vomiting. Patient not taking: Reported on 11/29/2023 11/04/23   Ladene Artist, MD     Family History  Problem Relation Age of Onset   COPD Mother    Cancer Father    Lung cancer Father    Colon cancer Neg Hx    Colon polyps Neg Hx    Rectal cancer Neg Hx    Stomach cancer Neg Hx     Social History   Socioeconomic History   Marital status: Married    Spouse name: Not on file   Number of children: 1   Years of education: Not on file   Highest education level: Not on file  Occupational History   Occupation: Retired  Tobacco Use   Smoking status: Never   Smokeless tobacco: Never  Vaping Use   Vaping status: Never Used  Substance and Sexual Activity   Alcohol use: Yes     Comment: occaional   Drug use: Never   Sexual activity: Yes  Other Topics Concern   Not on file  Social History Narrative   Not on file   Social Drivers of Health   Financial Resource Strain: Low Risk  (08/09/2023)   Overall Financial Resource Strain (CARDIA)    Difficulty of Paying Living Expenses: Not hard at all  Food Insecurity: No Food Insecurity (11/18/2023)   Hunger Vital Sign    Worried About Running Out of Food in the Last Year: Never true    Ran Out of Food in the Last Year: Never true  Transportation Needs: No Transportation Needs (11/18/2023)   PRAPARE - Administrator, Civil Service (Medical): No    Lack of Transportation (Non-Medical): No  Physical Activity: Inactive (08/11/2022)   Exercise Vital Sign    Days of Exercise per Week: 0 days    Minutes of Exercise per Session: 0 min  Stress: No Stress Concern Present (08/09/2023)   [employer] of Occupational Health - Occupational Stress Questionnaire    Feeling of Stress : Not at all  Social Connections: Moderately Integrated (11/18/2023)   Social Connection and Isolation Panel [NHANES]    Frequency of Communication with Friends and Family: More than three times a week    Frequency of Social Gatherings with Friends and Family: Twice a week    Attends Religious Services: More than 4 times per year    Active Member of [employer] or Organizations: No    Attends Engineer, structural: Never    Marital Status: Married       Review of Systems currently denies fever, headache, chest pain, dyspnea, cough, abdominal/back pain, nausea, vomiting or bleeding  Vital Signs: blood pressure 129/82, temp 98.2, heart rate 104, respirations 17, O2 sat 95% room air    Advance Care Plan: no documents on file.  Physical Exam awake, alert.  Chest clear to auscultation bilaterally.  Heart with regular rate and rhythm.  Abdomen soft, positive bowel sounds, right biliary drain intact, insertion site okay, not  significantly tender, drain capped.  No lower extremity edema.  Imaging: IR INT EXT BILIARY DRAIN WITH CHOLANGIOGRAM Result Date: 12/02/2023 INDICATION: Eighty history pancreatic head status post right percutaneous internal external biliary drain placement 11/19/2023. The patient presents with persistent hyperbilirubinemia. EXAM: 1. Percutaneous cholangiogram. 2. Biliary drain exchange. MEDICATIONS: Rocephin 2 gm IV; The antibiotic was administered within an appropriate time frame prior to the initiation of the procedure. ANESTHESIA/SEDATION: 0.5 mg sublingual lorazepam FLUOROSCOPY: Radiation Exposure Index (as provided by the fluoroscopic device): 11 mGy Kerma COMPLICATIONS: None immediate. PROCEDURE: Informed written consent was obtained from the patient after a thorough discussion of the procedural risks, benefits and alternatives. All questions were addressed. Maximal Sterile Barrier Technique was utilized including caps, mask, sterile gowns, sterile gloves, sterile drape, hand hygiene and skin antiseptic. A timeout was performed prior to the initiation of the procedure. Preprocedure scout radiograph from the right upper quadrant demonstrated no evidence of significant positional change of indwelling right-sided internal external biliary drain. Subdermal Local anesthesia was administered at the drainage tree site with 1% lidocaine. Gentle hand injection of contrast demonstrated patency of the drain throughout with reflux into the bilateral intrahepatic biliary tree without significant in ductal dilation. The external portion of the drain was cut to release inter pigtail. An Amplatz wire was inserted and the drain was exchanged for a new, 12 [place] biliary drain with the pigtail portion formed in the mid duodenum in the radiopaque side hole within a right-sided biliary radicle. Contrast was injected which demonstrated good positioning and patency of the drain. The drain was capped. A sterile bandage was  applied. The patient tolerated the procedure well was discharged home in good condition. IMPRESSION: 1. Patent indwelling right-sided internal external biliary drain without significant biliary ductal dilation. 2. Technically successful biliary drain exchange for a new, 12 [place] internal external biliary drain. The drain was capped. PLAN: Follow-up in approximately 4 weeks for routine biliary drain check and exchange. If hyperbilirubinemia persists, drain upsize could be considered. The patient and his wife were instructed on drain care and signs for how to place the drain back to bag drainage if needed. Marliss Coots, MD [facility] Radiology Electronically Signed   By: Marliss Coots M.D.   On: 12/02/2023 10:08    Labs:  CBC: Recent Labs    11/21/23 0523 11/29/23 0847 12/13/23 0829 12/30/23  1305  WBC 7.1 10.0 5.5 6.2  HGB 12.4* 15.2 13.1 13.7  HCT 37.1* 45.3 38.8* 41.2  PLT 207 309 166 215    COAGS: Recent Labs    11/19/23 1032 12/30/23 1305  INR 1.5* 1.1    BMP: Recent Labs    11/21/23 0523 11/29/23 0847 12/13/23 0829 12/27/23 1152  NA 137 135 138 138  K 3.9 4.2 4.1 4.3  CL 108 101 105 105  CO2 23 26 28 28   GLUCOSE 103* 135* 133* 112*  BUN 7* 17 11 12   CALCIUM 8.2* 9.2 8.9 9.0  CREATININE 0.66 0.82 0.72 0.69  GFRNONAA >60 >60 >60 >60    LIVER FUNCTION TESTS: Recent Labs    11/21/23 0523 11/29/23 0847 12/13/23 0829 12/27/23 1152  BILITOT 6.0* 6.0* 3.2* 1.7*  AST 62* 39 42* 37  ALT 62* 27 33 26  ALKPHOS 195* 200* 172* 184*  PROT 5.3* 7.1 6.1* 6.5  ALBUMIN 2.4* 3.8 3.4* 3.6    TUMOR MARKERS: No results for input(s): "AFPTM", "CEA", "CA199", "CHROMGRNA" in the last 8760 hours.  Assessment and Plan: 83 y.o. male with past medical history significant for arthritis, BPH, diverticulosis, hyperlipidemia, and ampullary cancer.  He is status post Whipple in August 2024 and right internal/external biliary drain  placement on 11/19/2023 with last exchange on 12/02/2023.  He presents today for follow-up cholangiogram with possible biliary drain exchange/upsizing or removal.  Total bilirubin on 12/27/23 was 1.7 (3.2), today's result 1.9  .Details/risks of procedure, including but not limited to, internal bleeding, infection, injury to adjacent structures, discussed with patient and spouse with their understanding and consent.   Thank you for allowing our service to participate in Charles Lawson 's care.  Electronically Signed: D. Jeananne Rama, PA-C   12/30/2023, 1:54 PM      I spent a total of    15 Minutes in face to face in clinical consultation, greater than 50% of which was counseling/coordinating care for cholangiogram with biliary drain exchange/possible upsize, possible removal

## 2023-12-30 NOTE — Discharge Instructions (Signed)
 Please call Interventional Radiology clinic [phone] with any questions or concerns.       Biliary Drainage Catheter Placement, Care After This sheet gives you information about how to care for yourself after your procedure. Your health care provider may also give you more specific instructions. If you have problems or questions, contact your health care provider. What can I expect after the procedure? After the procedure, it is common to have: Pain or soreness at the catheter insertion site. Tiredness and sleepiness for several hours. Some bruising at the catheter insertion site. Drainage into the collection bag on the outside of your body, if you have an external drainage catheter or an internal-external drainage catheter. You might see bloody discharge in the bag for the first 1 or 2 days. Then, the discharge should turn a yellow-green color. Follow these instructions at home: Medicines Take over-the-counter and prescription medicines only as told by your health care provider. Do not take aspirin or blood thinners unless your health care provider says that you can. These can make bleeding worse. Ask your health care provider if the medicine prescribed to you requires you to avoid driving or using machinery. Eating and drinking  Drink enough fluid to keep your urine pale yellow. Resume your usual diet as told by your health care provider. Catheter insertion site care  Clean your catheter insertion site as told by your health care provider. Do not take baths, swim, or use a hot tub until your health care provider approves. Take showers only. Before showering, cover your catheter insertion site with a watertight covering to keep the area dry. Keep the skin around your catheter insertion site dry. If the area gets wet, dry the skin completely. Check your catheter insertion site every day for signs of infection. Check for: More redness, swelling, or pain. Fluid or  blood. Warmth. Pus or a bad smell. General instructions For 24 hours after your procedure: Do not drink alcohol. Do not make legal decisions. If you were given a sedative during the procedure, it can affect you for several hours. Do not drive or operate machinery until your health care provider says that it is safe. Rest for the remainder of the day. Return to your normal activities as told by your health care provider. Ask your health care provider what activities are safe for you. Keep all follow-up visits as told by your health care provider. This is important. Contact a health care provider if: Your pain gets worse after it had improved, and it is not relieved with pain medicines. You have any questions about caring for your drainage catheter or collection bag. The skin around your catheter insertion site breaks down. You have any of these signs of infection around your catheter insertion site: More redness, swelling, or pain. Fluid or blood. Warmth. Pus or a bad smell. Get help right away if: You have a fever or chills. You have bile leaking around your drainage catheter. Your drainage catheter becomes blocked or clogged. Your drainage catheter comes out. Summary Clean the insertion site of your biliary drainage catheter as told by your health care provider. Keep the skin around your catheter insertion site dry. If the area gets wet, dry the skin completely. Check your catheter insertion site every day for signs of infection. Contact a health care provider if your pain gets worse after it had improved, and it is not relieved with pain medicines. Get help right away if your drainage catheter becomes blocked or clogged. This information is  not intended to replace advice given to you by your health care provider. Make sure you discuss any questions you have with your health care provider. Document Revised: 06/28/2019 Document Reviewed: 06/28/2019 Elsevier Patient Education  2023  Elsevier Inc.  Moderate Conscious Sedation, Adult, Care After This sheet gives you information about how to care for yourself after your procedure. Your health care provider may also give you more specific instructions. If you have problems or questions, contact your health care provider. What can I expect after the procedure? After the procedure, it is common to have: Sleepiness for several hours. Impaired judgment for several hours. Difficulty with balance. Vomiting if you eat too soon. Follow these instructions at home: For the time period you were told by your health care provider:     Rest. Do not participate in activities where you could fall or become injured. Do not drive or use machinery. Do not drink alcohol. Do not take sleeping pills or medicines that cause drowsiness. Do not make important decisions or sign legal documents. Do not take care of children on your own. Eating and drinking  Follow the diet recommended by your health care provider. Drink enough fluid to keep your urine pale yellow. If you vomit: Drink water, juice, or soup when you can drink without vomiting. Make sure you have little or no nausea before eating solid foods. General instructions Take over-the-counter and prescription medicines only as told by your health care provider. Have a responsible adult stay with you for the time you are told. It is important to have someone help care for you until you are awake and alert. Do not smoke. Keep all follow-up visits as told by your health care provider. This is important. Contact a health care provider if: You are still sleepy or having trouble with balance after 24 hours. You feel light-headed. You keep feeling nauseous or you keep vomiting. You develop a rash. You have a fever. You have redness or swelling around the IV site. Get help right away if: You have trouble breathing. You have new-onset confusion at home. Summary After the procedure,  it is common to feel sleepy, have impaired judgment, or feel nauseous if you eat too soon. Rest after you get home. Know the things you should not do after the procedure. Follow the diet recommended by your health care provider and drink enough fluid to keep your urine pale yellow. Get help right away if you have trouble breathing or new-onset confusion at home. This information is not intended to replace advice given to you by your health care provider. Make sure you discuss any questions you have with your health care provider. Document Revised: 01/05/2020 Document Reviewed: 08/03/2019 Elsevier Patient Education  2023 [employer].

## 2023-12-31 ENCOUNTER — Other Ambulatory Visit (HOSPITAL_COMMUNITY): Payer: Self-pay | Admitting: Interventional Radiology

## 2023-12-31 DIAGNOSIS — K831 Obstruction of bile duct: Secondary | ICD-10-CM

## 2024-01-03 ENCOUNTER — Other Ambulatory Visit (HOSPITAL_COMMUNITY): Payer: Self-pay | Admitting: Urology

## 2024-01-03 ENCOUNTER — Ambulatory Visit (HOSPITAL_COMMUNITY)
Admission: RE | Admit: 2024-01-03 | Discharge: 2024-01-03 | Disposition: A | Discharge status: Home / Self Care | Source: Ambulatory Visit | Attending: Urology

## 2024-01-03 ENCOUNTER — Other Ambulatory Visit: Payer: Self-pay | Admitting: Oncology

## 2024-01-03 ENCOUNTER — Encounter (HOSPITAL_COMMUNITY): Payer: Self-pay

## 2024-01-03 ENCOUNTER — Ambulatory Visit (HOSPITAL_COMMUNITY)
Admission: RE | Admit: 2024-01-03 | Discharge: 2024-01-03 | Disposition: A | Discharge status: Home / Self Care | Source: Ambulatory Visit | Attending: Oncology | Admitting: Oncology

## 2024-01-03 DIAGNOSIS — K831 Obstruction of bile duct: Secondary | ICD-10-CM

## 2024-01-03 DIAGNOSIS — I700 Atherosclerosis of aorta: Secondary | ICD-10-CM | POA: Diagnosis not present

## 2024-01-03 DIAGNOSIS — J9811 Atelectasis: Secondary | ICD-10-CM | POA: Diagnosis not present

## 2024-01-03 DIAGNOSIS — C241 Malignant neoplasm of ampulla of Vater: Secondary | ICD-10-CM | POA: Diagnosis not present

## 2024-01-03 DIAGNOSIS — M47814 Spondylosis without myelopathy or radiculopathy, thoracic region: Secondary | ICD-10-CM | POA: Insufficient documentation

## 2024-01-03 DIAGNOSIS — C761 Malignant neoplasm of thorax: Secondary | ICD-10-CM | POA: Diagnosis not present

## 2024-01-03 DIAGNOSIS — J90 Pleural effusion, not elsewhere classified: Secondary | ICD-10-CM | POA: Diagnosis not present

## 2024-01-03 DIAGNOSIS — Z434 Encounter for attention to other artificial openings of digestive tract: Secondary | ICD-10-CM | POA: Diagnosis not present

## 2024-01-03 HISTORY — PX: IR CHOLANGIOGRAM EXISTING TUBE: IMG6040

## 2024-01-03 MED ORDER — IOHEXOL 350 MG/ML SOLN
75.0000 mL | Freq: Once | INTRAVENOUS | Status: AC | PRN
  Administered 2024-01-03: 75 mL via INTRAVENOUS

## 2024-01-03 MED ORDER — IOHEXOL 300 MG/ML  SOLN
50.0000 mL | Freq: Once | INTRAMUSCULAR | Status: DC | PRN
Start: 2024-01-03 — End: 2024-01-04

## 2024-01-03 MED ORDER — SODIUM CHLORIDE (PF) 0.9 % IJ SOLN
INTRAMUSCULAR | Status: AC
Start: 2024-01-03 — End: ?
  Filled 2024-01-03: qty 50

## 2024-01-03 NOTE — Procedures (Signed)
 Interventional Radiology Procedure:   Indications: Pain after biliary drain exchange (exchange last week)  Procedure: Biliary drain injection  Findings: Biliary drain is well positioned and patent.  No fluid around liver based on ultrasound.  Complications: None     EBL: Minimal  Plan: Biliary drain is functioning and well positioned.  Main complaint is right chest pain.  Discussed with Dr. Scherrie Curt and will get a Chest CTA to rule out PE and look for source of pain.   Charles Lawson R. Charles Ogren, MD  Pager: [phone]

## 2024-01-04 ENCOUNTER — Other Ambulatory Visit: Payer: Self-pay

## 2024-01-04 NOTE — Progress Notes (Signed)
 Per wife therapy completed, disenrolling.

## 2024-01-05 ENCOUNTER — Telehealth: Payer: Self-pay

## 2024-01-05 NOTE — Telephone Encounter (Signed)
 Charles Lawson patient's spouse called in regards to CT angiography chest with contrast results being read by Dr. Scherrie Curt. Made patient aware that per provider the fluid collection looks small, doubts this explains his pain and would the patient want to continue routine follow-up on 04/24 or be seen earlier on 01/06/24 at 1:40 p.m. Patient's spouse would like patient to come in tomorrow. Understood and will scheduled that time/date.

## 2024-01-06 ENCOUNTER — Telehealth: Payer: Self-pay

## 2024-01-06 ENCOUNTER — Inpatient Hospital Stay

## 2024-01-06 ENCOUNTER — Other Ambulatory Visit (HOSPITAL_BASED_OUTPATIENT_CLINIC_OR_DEPARTMENT_OTHER): Payer: Self-pay

## 2024-01-06 ENCOUNTER — Inpatient Hospital Stay (HOSPITAL_BASED_OUTPATIENT_CLINIC_OR_DEPARTMENT_OTHER): Admitting: Oncology

## 2024-01-06 VITALS — BP 128/74 | HR 98 | Temp 98.1°F | Resp 18 | Ht 71.0 in | Wt 164.9 lb

## 2024-01-06 DIAGNOSIS — R748 Abnormal levels of other serum enzymes: Secondary | ICD-10-CM | POA: Diagnosis not present

## 2024-01-06 DIAGNOSIS — C241 Malignant neoplasm of ampulla of Vater: Secondary | ICD-10-CM

## 2024-01-06 DIAGNOSIS — Z9221 Personal history of antineoplastic chemotherapy: Secondary | ICD-10-CM | POA: Diagnosis not present

## 2024-01-06 DIAGNOSIS — Z95828 Presence of other vascular implants and grafts: Secondary | ICD-10-CM

## 2024-01-06 DIAGNOSIS — Z8589 Personal history of malignant neoplasm of other organs and systems: Secondary | ICD-10-CM | POA: Diagnosis not present

## 2024-01-06 LAB — CBC WITH DIFFERENTIAL (CANCER CENTER ONLY)
Abs Immature Granulocytes: 0.06 10*3/uL (ref 0.00–0.07)
Basophils Absolute: 0.1 10*3/uL (ref 0.0–0.1)
Basophils Relative: 0 %
Eosinophils Absolute: 0.1 10*3/uL (ref 0.0–0.5)
Eosinophils Relative: 1 %
HCT: 40.1 % (ref 39.0–52.0)
Hemoglobin: 13.8 g/dL (ref 13.0–17.0)
Immature Granulocytes: 1 %
Lymphocytes Relative: 18 %
Lymphs Abs: 2 10*3/uL (ref 0.7–4.0)
MCH: 32.1 pg (ref 26.0–34.0)
MCHC: 34.4 g/dL (ref 30.0–36.0)
MCV: 93.3 fL (ref 80.0–100.0)
Monocytes Absolute: 1.8 10*3/uL — ABNORMAL HIGH (ref 0.1–1.0)
Monocytes Relative: 16 %
Neutro Abs: 7.4 10*3/uL (ref 1.7–7.7)
Neutrophils Relative %: 64 %
Platelet Count: 339 10*3/uL (ref 150–400)
RBC: 4.3 MIL/uL (ref 4.22–5.81)
RDW: 16 % — ABNORMAL HIGH (ref 11.5–15.5)
WBC Count: 11.4 10*3/uL — ABNORMAL HIGH (ref 4.0–10.5)
nRBC: 0 % (ref 0.0–0.2)

## 2024-01-06 LAB — CMP (CANCER CENTER ONLY)
ALT: 27 U/L (ref 0–44)
AST: 31 U/L (ref 15–41)
Albumin: 3.3 g/dL — ABNORMAL LOW (ref 3.5–5.0)
Alkaline Phosphatase: 219 U/L — ABNORMAL HIGH (ref 38–126)
Anion gap: 7 (ref 5–15)
BUN: 13 mg/dL (ref 8–23)
CO2: 28 mmol/L (ref 22–32)
Calcium: 9.4 mg/dL (ref 8.9–10.3)
Chloride: 100 mmol/L (ref 98–111)
Creatinine: 0.63 mg/dL (ref 0.61–1.24)
GFR, Estimated: >60 mL/min (ref >60)
Glucose, Bld: 110 mg/dL — ABNORMAL HIGH (ref 70–99)
Potassium: 4.3 mmol/L (ref 3.5–5.1)
Sodium: 135 mmol/L (ref 135–145)
Total Bilirubin: 1.1 mg/dL (ref 0.0–1.2)
Total Protein: 7 g/dL (ref 6.5–8.1)

## 2024-01-06 MED ORDER — SODIUM CHLORIDE 0.9% FLUSH
10.0000 mL | INTRAVENOUS | Status: DC | PRN
  Administered 2024-01-06: 10 mL via INTRAVENOUS

## 2024-01-06 MED ORDER — HEPARIN SOD (PORK) LOCK FLUSH 100 UNIT/ML IV SOLN
500.0000 [IU] | Freq: Once | INTRAVENOUS | Status: AC
  Administered 2024-01-06: 500 [IU] via INTRAVENOUS

## 2024-01-06 NOTE — Progress Notes (Signed)
 Charles Lawson   Diagnosis: Ampullary carcinoma  INTERVAL HISTORY:   Charles Lawson is prior to a scheduled visit.  He underwent an internal/external biliary drain exchange on 12/30/2023.  He developed right chest discomfort following the procedure.  He reports pleuritic pain and exertional dyspnea. He was evaluated by Charles Lawson on 01/03/2024.  The internal/external drain was noted to be in good position and patent. A CT of the chest revealed loculated right pleural fluid.  No pulmonary embolism. He reports improvement in pain over the past few days.  He is taking Tylenol for pain. No fever/chills, jaundice, or pruritus. Objective:  Vital signs in last 24 hours:  Blood pressure 128/74, pulse 98, temperature 98.1 F (36.7 C), temperature source Temporal, resp. rate 18, height 5\' 11"  (1.803 m), weight 164 lb 14.4 oz (74.8 kg), SpO2 98%.    HEENT: No thrush or ulcers Lymphatics: No cervical, supraclavicular, or axillary nodes Resp: Decreased breath sounds at the right compared to the left chest, no respiratory distress Cardio: Regular rate and rhythm GI: No hepatosplenomegaly, nontender, right upper abdomen/lower chest biliary drain site without evidence of infection Vascular: No leg edema   Portacath/PICC-without erythema  Lab Results:  Lab Results  Component Value Date   WBC 6.2 12/30/2023   HGB 13.7 12/30/2023   HCT 41.2 12/30/2023   MCV 97.2 12/30/2023   PLT 215 12/30/2023   NEUTROABS 3.3 12/30/2023    CMP  Lab Results  Component Value Date   NA 137 12/30/2023   K 4.0 12/30/2023   CL 106 12/30/2023   CO2 24 12/30/2023   GLUCOSE 103 (H) 12/30/2023   BUN 14 12/30/2023   CREATININE 0.77 12/30/2023   CALCIUM 9.1 12/30/2023   PROT 6.6 12/30/2023   ALBUMIN 3.3 (L) 12/30/2023   AST 40 12/30/2023   ALT 29 12/30/2023   ALKPHOS 165 (H) 12/30/2023   BILITOT 1.9 (H) 12/30/2023   GFRNONAA >60 12/30/2023   GFRAA  11/19/2008    >60        The  eGFR has been calculated using the MDRD equation. This calculation has not been validated in all clinical situations. eGFR's persistently <60 mL/min signify possible Chronic Kidney Disease.    Lab Results  Component Value Date   WJX914 12 06/23/2023    Imaging:  CT Angio Chest Pulmonary Embolism (PE) W or WO Contrast Result Date: 01/03/2024 CLINICAL DATA:  Ampullary carcinoma, right chest pain * Tracking Code: BO * EXAM: CT ANGIOGRAPHY CHEST WITH CONTRAST TECHNIQUE: Multidetector CT imaging of the chest was performed using the standard protocol during bolus administration of intravenous contrast. Multiplanar CT image reconstructions and MIPs were obtained to evaluate the vascular anatomy. RADIATION DOSE REDUCTION: This exam was performed according to the departmental dose-optimization program which includes automated exposure control, adjustment of the mA and/or kV according to patient size and/or use of iterative reconstruction technique. CONTRAST:  75mL OMNIPAQUE IOHEXOL 350 MG/ML SOLN COMPARISON:  06/02/2023 FINDINGS: Cardiovascular: No filling defect is identified in the pulmonary arterial tree to suggest pulmonary embolus. Right internal jugular central venous catheter tip: Lower SVC. Atheromatous vascular calcification in the aortic arch. Mediastinum/Nodes: Unremarkable Lungs/Pleura: Small to moderate amount of loculated pleural fluid along the right fissures and posteriorly adjacent to the right lower lobe with associated passive atelectasis. Upper Abdomen: Pneumobilia. Percutaneous catheter extends in the common bile duct and loops in the vicinity of the presumed choledochojejunostomy. Postoperative findings in the stomach. Musculoskeletal: Chondrocalcinosis along the glenohumeral joint capsules  and within the right glenoid labrum. Thoracic spondylosis. Review of the MIP images confirms the above findings. IMPRESSION: 1. No filling defect is identified in the pulmonary arterial tree to  suggest pulmonary embolus. 2. Small to moderate amount of loculated pleural fluid along the right fissures and posteriorly adjacent to the right lower lobe with associated passive atelectasis. 3. Percutaneous biliary catheter extends in the common bile duct and loops in the vicinity of the presumed choledochojejunostomy. Pneumobilia. 4. Chondrocalcinosis along the glenohumeral joint capsules and within the right glenoid labrum. 5. Thoracic spondylosis. 6.  Aortic Atherosclerosis (ICD10-I70.0). Electronically Signed   By: Charles Rong M.D.   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 Patient has an internal/external biliary drain. Drain was recently exchanged on 12/30/2023. Patient is complaining right abdominal and chest pain. EXAM: CHOLANGIOGRAM VIA EXISTING CATHETER Physician: Charles Hora. Lowella Dandy, Charles Lawson FLUOROSCOPY: Radiation Exposure Index (as provided by the fluoroscopic device): 1 mGy Kerma MEDICATIONS: None ANESTHESIA/SEDATION: None CONTRAST:  6 mL Omnipaque 300 PROCEDURE: Patient was placed supine on the interventional table. Biliary drain was injected with contrast under fluoroscopy. Drain was then flushed with saline. Drain was capped. FINDINGS: Right-sided internal/external biliary drain is unchanged with the tip in the small bowel. Again noted are changes compatible with hepaticojejunostomy and status post Whipple procedure. Contrast fills the intrahepatic bile ducts and the small bowel. Mild dilatation of the main left hepatic duct is unchanged. Densities in the right lower chest could represent pleural fluid and volume loss. COMPLICATIONS: None IMPRESSION: 1. Internal/external biliary drain is well positioned and patent. Biliary drain was capped. 2. Right chest and abdominal pain was discussed with patient's oncologist, Charles Lawson. Chest CTA was ordered to  evaluate the chest pain. Electronically Signed   By: Charles Overlie M.D.   On: 01/03/2024 18:04    Medications: I have reviewed the patient's current medications.   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 (pT3b,pN1), lymphovascular and perineural invasion present, negative resection margins-closest margins or anterior and posterior peripancreatic soft tissue surfaces at 0.3 cm Elevated CA 19-9 Cycle 1 day 1 gemcitabine/capecitabine 06/23/2023, day 15 gemcitabine 07/06/2023 Cycle 2-day 1 gemcitabine/capecitabine 07/21/2023, day 15 gemcitabine 08/04/2023 Cycle 3-day 1 gemcitabine 08/18/2023 (capecitabine 08/20/2023 dose reduced), day 15 gemcitabine 09/01/2023 Cycle 4-day 1 gemcitabine/capecitabine 09/20/2023, day 15 gemcitabine 10/04/2023 CT abdomen/pelvis 10/09/2023:-No evidence of recurrent pancreatic cancer, mild enhancement of the common hepatic duct leading up to the enteric anastomosis, mild biliary duct dilation in the left hepatic lobe Cycle 5-day 1 gemcitabine/capecitabine 11/01/2023; day 15 Gemcitabine held 11/15/2023 due to elevated liver enzymes, last capecitabine given a.m. 11/17/2023 Cycle 6-day 1 gemcitabine/capecitabine 11/29/2023, day 15 Gemcitabine 12/13/2023 Duodenal obstruction August 2024 secondary to #1 H. pylori gastritis 05/10/2023-we will confirm treatment plan with Dr. 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  L2-L3 mild spinal canal stenosis.  L3-L4 mild to moderate left neural foraminal narrowing.  L5-S1 mild bilateral neural foraminal narrowing.  Narrowing of the left lateral recess at L1-L2. Elevated liver enzymes January 2025 MRI/MRCP 10/22/2023: No evidence of metastatic disease, mild intrahepatic biliary dilation, uniform thickening of the common bile duct with  enhancement Liver enzymes normal 10/27/2023 7.  Admission 11/17/2023 with obstructive jaundice/cholangitis CT abdomen/pelvis 11/17/2023: Increased Intermatic biliary duct dilation terminating in prominent soft  tissue in the porta hepatis MRI abdomen/MRCP 11/18/2023-progressive moderate intrahepatic and extrahepatic biliary duct dilation tapering at the common duct anastomosis, persistent spiculated soft tissue at the anastomotic site 11/19/2023-image guided percutaneous cholangiogram with internal/external biliary drain placement Biliary drain exchange 12/02/2023 Biliary drain exchange 12/30/2023 Right chest pain following biliary drain exchange 12/30/2023 IR cholangiogram 01/03/2024: Biliary drain well-positioned and patent CT chest 01/03/2024: Negative for pulmonary embolism, small to moderate amount of loculated right pleural fluid with passive atelectasis at the right lower lobe      Disposition: Charles Lawson has a history of ampullary carcinoma.  He completed a course of adjuvant gemcitabine/capecitabine last month.  He was admitted 11/17/2023 with obstructive jaundice and cholangitis.  He underwent placement of a internal/external biliary drain.  A drain remains in place.  The drain was exchanged on 12/30/2023.  Developed pleuritic right chest pain following this procedure.  A CT reveals loculated right pleural fluid.  I suspect the pleural fluid is related to the biliary drain procedures or the recent admission with cholangitis.  He appears well today.  No recurrent fever or chills.  The plan is to continue observation.  He will call for a fever/chills or increased dyspnea.  He is scheduled for a drain study and possible stent placement on 02/08/2024.  He will return for an office visit and Port-A-Cath flush during the week of 02/14/2024.  Charles Deep, Charles Lawson  01/06/2024  1:54 PM

## 2024-01-06 NOTE — Telephone Encounter (Signed)
 Contacted [facility], spoke with Pharmacist Athena Bland about some concerns that this patient is having. Per Patient/ spouse picked up only one bottle 100 capsules instead of the usual three bottles 300 capsules. Patient had to pay for one bottle of capsules regular price rather than picking up all of the bottles for the same price. Pharmacist stated that [facility] does not break prescription bottles, he stated the prescription would need to be changed to " 1 capsule (12,000 units) three times a day before meals/with snacks".After the new prescription is sent in, the 300 capsules can be filled. Gave patient financial assistance paperwork to fill out and bring back in to [facility] to help lower cost of prescription.

## 2024-01-07 ENCOUNTER — Other Ambulatory Visit: Payer: Self-pay

## 2024-01-13 ENCOUNTER — Inpatient Hospital Stay: Admitting: Oncology

## 2024-01-13 ENCOUNTER — Inpatient Hospital Stay

## 2024-01-19 ENCOUNTER — Other Ambulatory Visit (HOSPITAL_BASED_OUTPATIENT_CLINIC_OR_DEPARTMENT_OTHER): Payer: Self-pay

## 2024-01-19 ENCOUNTER — Other Ambulatory Visit: Payer: Self-pay | Admitting: *Deleted

## 2024-01-19 DIAGNOSIS — C241 Malignant neoplasm of ampulla of Vater: Secondary | ICD-10-CM

## 2024-01-19 MED ORDER — PANCRELIPASE (LIP-PROT-AMYL) 12000-38000 UNITS PO CPEP
12000.0000 [IU] | ORAL_CAPSULE | Freq: Three times a day (TID) | ORAL | 0 refills | Status: DC
  Filled 2024-01-19: qty 200, 67d supply, fill #0
  Filled 2024-03-23: qty 100, 34d supply, fill #1

## 2024-01-19 NOTE — Telephone Encounter (Signed)
 Mrs. Tester brought in the Creon  application and finance information. Sent this with MD portion of form to [employer] # [phone]. Also sent in 3 month supply of Creon  to [facility] at wife's request to see what cost would be with them.

## 2024-01-26 ENCOUNTER — Encounter: Payer: Self-pay | Admitting: *Deleted

## 2024-01-26 NOTE — Progress Notes (Signed)
 Refaxed updated application for Creon  to [employer] [phone]

## 2024-01-28 ENCOUNTER — Encounter: Payer: Self-pay | Admitting: *Deleted

## 2024-02-02 DIAGNOSIS — Z08 Encounter for follow-up examination after completed treatment for malignant neoplasm: Secondary | ICD-10-CM | POA: Diagnosis not present

## 2024-02-02 DIAGNOSIS — L814 Other melanin hyperpigmentation: Secondary | ICD-10-CM | POA: Diagnosis not present

## 2024-02-02 DIAGNOSIS — L821 Other seborrheic keratosis: Secondary | ICD-10-CM | POA: Diagnosis not present

## 2024-02-02 DIAGNOSIS — L578 Other skin changes due to chronic exposure to nonionizing radiation: Secondary | ICD-10-CM | POA: Diagnosis not present

## 2024-02-02 DIAGNOSIS — D225 Melanocytic nevi of trunk: Secondary | ICD-10-CM | POA: Diagnosis not present

## 2024-02-02 DIAGNOSIS — Z85828 Personal history of other malignant neoplasm of skin: Secondary | ICD-10-CM | POA: Diagnosis not present

## 2024-02-07 ENCOUNTER — Other Ambulatory Visit: Payer: Self-pay | Admitting: Radiology

## 2024-02-07 DIAGNOSIS — K831 Obstruction of bile duct: Secondary | ICD-10-CM

## 2024-02-07 NOTE — H&P (Signed)
 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  Referring Provider(s): Dr. Rosslyn Coons, MD  Supervising Physician: Creasie Doctor  Patient Status: [facility] - Out-pt  Patient is Full Code  History of Present Illness: Charles Lawson is a 83 y.o. male  with PMHx notable for arthritis, BPH, diverticulosis, hyperlipidemia, and ampullary cancer.  He is status post Whipple procedure (04/2023) and internal/external biliary drain placement (11/19/23). Patient's drain has been capped since last exchange on 4/10.  Per Dr. 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  He completed a course of adjuvant gemcitabine /capecitabine  last month.  He was admitted 11/17/2023 with obstructive jaundice and cholangitis.  He underwent placement of a internal/external biliary drain.  A drain remains in place.  The drain was exchanged on 12/30/2023.  Developed pleuritic right chest pain following this procedure.  A CT reveals loculated right pleural fluid.  I suspect the pleural fluid is related to the biliary drain procedures or the recent admission with cholangitis."  Patient is scheduled for biliary drain study with possible stent placement in IR today.   Patient is alert and laying in bed, calm.  Patient is currently without any significant complaints.  Patient denies any fevers, headache, chest pain, SOB, cough, abdominal pain, nausea, vomiting or bleeding.     Past Medical History:  Diagnosis Date   Allergic rhinitis, 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   Arthritis    Blood pressure elevated without history of HTN    BPH (benign prostatic hyperplasia)    Diverticulosis of colon    ED (erectile dysfunction)    Helicobacter pylori gastritis 04/2023   Tx quad tx and eradication documented neg stool ag 07/2023   History of  basal cell carcinoma (BCC) excision    Hyperlipidemia    Impaired glucose tolerance 04/03/2013   Personal history of colonic polyps 06/07/2012    Past Surgical History:  Procedure Laterality Date   Candida Chalk OSTEOTOMY Left 07/19/2020   Procedure: Candida Chalk OSTEOTOMY;  Surgeon: Floyce Hutching, DPM;  Location: [facility];  Service: Podiatry;  Laterality: Left;   BIOPSY  05/10/2023   Procedure: BIOPSY;  Surgeon: Kenney Peacemaker, MD;  Location: [facility];  Service: Gastroenterology;;   BIOPSY  05/12/2023   Procedure: BIOPSY;  Surgeon: Kenney Peacemaker, MD;  Location: [facility];  Service: Gastroenterology;;   Tillman Folks Left 07/19/2020   Procedure: Adelita Honer;  Surgeon: Floyce Hutching, DPM;  Location: [facility];  Service: Podiatry;  Laterality: Left;   COLONOSCOPY  last one 10-06-2017  dr Willy Harvest   ESOPHAGOGASTRODUODENOSCOPY N/A 05/12/2023   Procedure: ESOPHAGOGASTRODUODENOSCOPY (EGD);  Surgeon: Kenney Peacemaker, MD;  Location: [facility];  Service: Gastroenterology;  Laterality: N/A;   ESOPHAGOGASTRODUODENOSCOPY (EGD) WITH PROPOFOL  N/A 05/10/2023   Procedure: ESOPHAGOGASTRODUODENOSCOPY (EGD) WITH PROPOFOL ;  Surgeon: Kenney Peacemaker, MD;  Location: [facility];  Service: Gastroenterology;  Laterality: N/A;   HAMMER TOE SURGERY Left 07/19/2020   Procedure: HAMMER TOE CORRECTION;  Surgeon: Floyce Hutching, DPM;  Location: [facility];  Service: Podiatry;  Laterality: Left;   INGUINAL HERNIA REPAIR Right 11-21-2008  @ [facility]   IR CHOLANGIOGRAM EXISTING TUBE  01/03/2024   IR EXCHANGE BILIARY DRAIN  12/30/2023   IR IMAGING GUIDED PORT INSERTION  06/18/2023   IR INT EXT BILIARY DRAIN WITH CHOLANGIOGRAM  11/19/2023  IR INT EXT BILIARY DRAIN WITH CHOLANGIOGRAM  12/02/2023   LUMBAR LAMINECTOMY  1974   METATARSAL OSTEOTOMY Left 07/19/2020   Procedure: METATARSAL OSTEOTOMY;  Surgeon: Floyce Hutching, DPM;  Location: [facility] [place];   Service: Podiatry;  Laterality: Left;   ORIF TOE FRACTURE Left 07/19/2020   Procedure: OPEN TREATMENT OF METARSAL PHALANGEAL JOINT DISLOCATION;  Surgeon: Floyce Hutching, DPM;  Location: [facility];  Service: Podiatry;  Laterality: Left;  ANKLE BLOCK WITH THIGH TOURNIQUET    Allergies: Penicillins  Medications: Prior to Admission medications   Medication Sig Start Date End Date Taking? Authorizing Provider  acetaminophen  (TYLENOL ) 650 MG CR tablet Take 500 mg by mouth every 8 (eight) hours as needed for pain. 05/27/23: reports during [facility] call taking after recent surgery at [facility]    Roslyn Coombe, MD  capecitabine  (XELODA ) 500 MG tablet Take #1 tablet (500 mg) in am and #2 tablets (1000 mg) in pm. Take for 21 days, then hold for 7 days. Repeat every 28 days. Start cycle on 11/29/2023 11/15/23   Thomas, Lisa K, NP  Cyanocobalamin  (VITAMIN B-12 PO) Take 1 tablet by mouth at bedtime.    [provider]  fluconazole  (DIFLUCAN ) 100 MG tablet Take 1 tablet (100 mg total) by mouth daily. 11/29/23   Sumner Ends, MD  lidocaine -prilocaine  (EMLA ) cream Apply 1 Application topically as needed. 11/15/23   Roseline Conine, NP  lipase/protease/amylase (CREON ) 12000-38000 units CPEP capsule Take 1 capsule (12,000 Units total) by mouth 3 (three) times daily before meals. 01/19/24   Sumner Ends, MD  omeprazole  (PRILOSEC ) 20 MG capsule Take 1 capsule (20 mg total) by mouth daily. 08/16/23   Roslyn Coombe, MD  ondansetron  (ZOFRAN ) 8 MG tablet Take 1 tablet (8 mg total) by mouth every 8 (eight) hours as needed for nausea. Patient not taking: Reported on 01/06/2024 11/04/23   Sumner Ends, MD  oxycodone  (OXY-IR) 5 MG capsule Take 5 mg by mouth every 6 (six) hours as needed for pain. Patient not taking: Reported on 11/29/2023    [provider]  potassium chloride  (KLOR-CON  M) 10 MEQ tablet Take 1 tablet (10 mEq total) by mouth 2 (two) times daily. 08/23/23   Sumner Ends, MD   prochlorperazine  (COMPAZINE ) 10 MG tablet Take 1 tablet (10 mg total) by mouth every 6 (six) hours as needed for nausea or vomiting. Patient not taking: Reported on 11/29/2023 11/04/23   Sumner Ends, MD  tamsulosin  (FLOMAX ) 0.4 MG CAPS capsule Take 1 capsule (0.4 mg total) by mouth daily. 05/27/23: reports during [facility] call taking after recent surgery at [facility] 11/22/23   Roslyn Coombe, MD     Family History  Problem Relation Age of Onset   COPD Mother    Cancer Father    Lung cancer Father    Colon cancer Neg Hx    Colon polyps Neg Hx    Rectal cancer Neg Hx    Stomach cancer Neg Hx     Social History   Socioeconomic History   Marital status: Married    Spouse name: Not on file   Number of children: 1   Years of education: Not on file   Highest education level: Not on file  Occupational History   Occupation: Retired  Tobacco Use   Smoking status: Never   Smokeless tobacco: Never  Vaping Use   Vaping status: Never Used  Substance and Sexual Activity   Alcohol use:  Yes    Comment: occaional   Drug use: Never   Sexual activity: Yes  Other Topics Concern   Not on file  Social History Narrative   Not on file   Social Drivers of Health   Financial Resource Strain: Low Risk  (08/09/2023)   Overall Financial Resource Strain (CARDIA)    Difficulty of Paying Living Expenses: Not hard at all  Food Insecurity: No Food Insecurity (11/18/2023)   Hunger Vital Sign    Worried About Running Out of Food in the Last Year: Never true    Ran Out of Food in the Last Year: Never true  Transportation Needs: No Transportation Needs (11/18/2023)   PRAPARE - Administrator, Civil Service (Medical): No    Lack of Transportation (Non-Medical): No  Physical Activity: Inactive (08/11/2022)   Exercise Vital Sign    Days of Exercise per Week: 0 days    Minutes of Exercise per Session: 0 min  Stress: No Stress Concern Present (08/09/2023)   [employer] of Occupational Health -  Occupational Stress Questionnaire    Feeling of Stress : Not at all  Social Connections: Moderately Integrated (11/18/2023)   Social Connection and Isolation Panel [NHANES]    Frequency of Communication with Friends and Family: More than three times a week    Frequency of Social Gatherings with Friends and Family: Twice a week    Attends Religious Services: More than 4 times per year    Active Member of [employer] or Organizations: No    Attends Banker Meetings: Never    Marital Status: Married     Review of Systems: A 12 point ROS discussed and pertinent positives are indicated in the HPI above.  All other systems are negative.  Vital Signs: Temp 97.8 F (36.6 C) (Oral)   Resp 18   Ht 5\' 11"  (1.803 m)   Wt 160 lb (72.6 kg)   BMI 22.32 kg/m   Advance Care Plan: The advanced care place/surrogate decision maker was discussed at the time of visit and the patient did not wish to discuss or was not able to name a surrogate decision maker or provide an advance care plan.  Physical Exam Vitals reviewed.  Constitutional:      General: He is not in acute distress.    Appearance: Normal appearance.  HENT:     Mouth/Throat:     Mouth: Mucous membranes are dry.  Cardiovascular:     Rate and Rhythm: Normal rate and regular rhythm.     Pulses: Normal pulses.     Heart sounds: No murmur heard. Pulmonary:     Effort: Pulmonary effort is normal. No respiratory distress.     Breath sounds: Normal breath sounds.  Abdominal:     General: Abdomen is flat.     Tenderness: There is no abdominal tenderness.  Musculoskeletal:        General: Normal range of motion.     Cervical back: Normal range of motion.  Skin:    General: Skin is warm and dry.  Neurological:     Mental Status: He is alert and oriented to person, place, and time.  Psychiatric:        Mood and Affect: Mood normal.        Behavior: Behavior normal.        Thought Content: Thought content normal.         Judgment: Judgment normal.     Imaging: No results found.  Labs:  CBC: Recent Labs    12/13/23 0829 12/30/23 1305 01/06/24 1510 02/08/24 1000  WBC 5.5 6.2 11.4* 6.8  HGB 13.1 13.7 13.8 14.3  HCT 38.8* 41.2 40.1 44.3  PLT 166 215 339 158    COAGS: Recent Labs    11/19/23 1032 12/30/23 1305 02/08/24 1000  INR 1.5* 1.1 1.1    BMP: Recent Labs    12/13/23 0829 12/27/23 1152 12/30/23 1305 01/06/24 1510  NA 138 138 137 135  K 4.1 4.3 4.0 4.3  CL 105 105 106 100  CO2 28 28 24 28   GLUCOSE 133* 112* 103* 110*  BUN 11 12 14 13   CALCIUM  8.9 9.0 9.1 9.4  CREATININE 0.72 0.69 0.77 0.63  GFRNONAA >60 >60 >60 >60    LIVER FUNCTION TESTS: Recent Labs    12/13/23 0829 12/27/23 1152 12/30/23 1305 01/06/24 1510  BILITOT 3.2* 1.7* 1.9* 1.1  AST 42* 37 40 31  ALT 33 26 29 27   ALKPHOS 172* 184* 165* 219*  PROT 6.1* 6.5 6.6 7.0  ALBUMIN 3.4* 3.6 3.3* 3.3*    TUMOR MARKERS: No results for input(s): "AFPTM", "CEA", "CA199", "CHROMGRNA" in the last 8760 hours.  Assessment and Plan: Per Dr. 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  He completed a course of adjuvant gemcitabine /capecitabine  last month.  He was admitted 11/17/2023 with obstructive jaundice and cholangitis.  He underwent placement of a internal/external biliary drain.  A drain remains in place.  The drain was exchanged on 12/30/2023.  Developed pleuritic right chest pain following this procedure.  A CT reveals loculated right pleural fluid.  I suspect the pleural fluid is related to the biliary drain procedures or the recent admission with cholangitis.   [...] The plan is to continue observation.     [...] He is scheduled for a drain study and possible stent placement on 02/08/2024.  He will return for an office visit and Port-A-Cath flush during the week of 02/14/2024."  Patient presents for scheduled biliary drain study with possible stent placement in IR  today.  Patient's drain has been capped since last exchange on 4/10.  Patient has been NPO since midnight.  All labs and medications are within acceptable parameters.  No pertinent allergies.   Risks and benefits of biliary drain study with possible stent placement  discussed with the patient including, but not limited to bleeding, infection which may lead to sepsis or even death and damage to adjacent structures.  This interventional procedure involves the use of X-rays and because of the nature of the planned procedure, it is possible that we will have prolonged use of X-ray fluoroscopy.  Potential radiation risks to you include (but are not limited to) the following: - A slightly elevated risk for cancer  several years later in life. This risk is typically less than 0.5% percent. This risk is low in comparison to the normal incidence of human cancer, which is 33% for women and 50% for men according to the American Cancer Society. - Radiation induced injury can include skin redness, resembling a rash, tissue breakdown / ulcers and hair loss (which can be temporary or permanent).   The likelihood of either of these occurring depends on the difficulty of the procedure and whether you are sensitive to radiation due to previous procedures, disease, or genetic conditions.   IF your procedure requires a prolonged use of radiation, you will be notified and given written instructions for further action.  It is  your responsibility to monitor the irradiated area for the 2 weeks following the procedure and to notify your physician if you are concerned that you have suffered a radiation induced injury.    All of the patient's questions were answered, patient is agreeable to proceed.  Consent signed and in chart.    Thank you for allowing our service to participate in Charles Lawson 's care.  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

## 2024-02-08 ENCOUNTER — Other Ambulatory Visit (HOSPITAL_COMMUNITY): Payer: Self-pay | Admitting: Interventional Radiology

## 2024-02-08 ENCOUNTER — Encounter (HOSPITAL_COMMUNITY): Payer: Self-pay

## 2024-02-08 ENCOUNTER — Ambulatory Visit (HOSPITAL_COMMUNITY)
Admission: RE | Admit: 2024-02-08 | Discharge: 2024-02-08 | Disposition: A | Discharge status: Home / Self Care | Source: Ambulatory Visit | Attending: Interventional Radiology | Admitting: Interventional Radiology

## 2024-02-08 ENCOUNTER — Other Ambulatory Visit: Payer: Self-pay

## 2024-02-08 VITALS — BP 129/87 | HR 78 | Temp 97.9°F | Resp 16 | Ht 71.0 in | Wt 160.0 lb

## 2024-02-08 DIAGNOSIS — K831 Obstruction of bile duct: Secondary | ICD-10-CM

## 2024-02-08 DIAGNOSIS — Z01818 Encounter for other preprocedural examination: Secondary | ICD-10-CM

## 2024-02-08 DIAGNOSIS — Z434 Encounter for attention to other artificial openings of digestive tract: Secondary | ICD-10-CM | POA: Insufficient documentation

## 2024-02-08 HISTORY — PX: IR EXCHANGE BILIARY DRAIN: IMG6046

## 2024-02-08 LAB — CBC WITH DIFFERENTIAL/PLATELET
Abs Immature Granulocytes: 0.01 10*3/uL (ref 0.00–0.07)
Basophils Absolute: 0 10*3/uL (ref 0.0–0.1)
Basophils Relative: 0 %
Eosinophils Absolute: 0.1 10*3/uL (ref 0.0–0.5)
Eosinophils Relative: 2 %
HCT: 44.3 % (ref 39.0–52.0)
Hemoglobin: 14.3 g/dL (ref 13.0–17.0)
Immature Granulocytes: 0 %
Lymphocytes Relative: 31 %
Lymphs Abs: 2.1 10*3/uL (ref 0.7–4.0)
MCH: 30.4 pg (ref 26.0–34.0)
MCHC: 32.3 g/dL (ref 30.0–36.0)
MCV: 94.3 fL (ref 80.0–100.0)
Monocytes Absolute: 0.7 10*3/uL (ref 0.1–1.0)
Monocytes Relative: 10 %
Neutro Abs: 3.9 10*3/uL (ref 1.7–7.7)
Neutrophils Relative %: 57 %
Platelets: 158 10*3/uL (ref 150–400)
RBC: 4.7 MIL/uL (ref 4.22–5.81)
RDW: 13.8 % (ref 11.5–15.5)
WBC: 6.8 10*3/uL (ref 4.0–10.5)
nRBC: 0 % (ref 0.0–0.2)

## 2024-02-08 LAB — COMPREHENSIVE METABOLIC PANEL WITH GFR
ALT: 36 U/L (ref 0–44)
AST: 44 U/L — ABNORMAL HIGH (ref 15–41)
Albumin: 3.2 g/dL — ABNORMAL LOW (ref 3.5–5.0)
Alkaline Phosphatase: 152 U/L — ABNORMAL HIGH (ref 38–126)
Anion gap: 9 (ref 5–15)
BUN: 12 mg/dL (ref 8–23)
CO2: 24 mmol/L (ref 22–32)
Calcium: 8.9 mg/dL (ref 8.9–10.3)
Chloride: 105 mmol/L (ref 98–111)
Creatinine, Ser: 0.75 mg/dL (ref 0.61–1.24)
GFR, Estimated: >60 mL/min (ref >60)
Glucose, Bld: 106 mg/dL — ABNORMAL HIGH (ref 70–99)
Potassium: 3.6 mmol/L (ref 3.5–5.1)
Sodium: 138 mmol/L (ref 135–145)
Total Bilirubin: 1.1 mg/dL (ref 0.0–1.2)
Total Protein: 6.8 g/dL (ref 6.5–8.1)

## 2024-02-08 LAB — PROTIME-INR
INR: 1.1 (ref 0.8–1.2)
Prothrombin Time: 14.5 s (ref 11.4–15.2)

## 2024-02-08 MED ORDER — MIDAZOLAM HCL 2 MG/2ML IJ SOLN
INTRAMUSCULAR | Status: AC | PRN
  Administered 2024-02-08: .5 mg via INTRAVENOUS
  Administered 2024-02-08: 1 mg via INTRAVENOUS

## 2024-02-08 MED ORDER — SODIUM CHLORIDE 0.9% FLUSH
5.0000 mL | Freq: Three times a day (TID) | INTRAVENOUS | Status: DC
  Administered 2024-02-08: 5 mL

## 2024-02-08 MED ORDER — LIDOCAINE HCL 1 % IJ SOLN
20.0000 mL | Freq: Once | INTRAMUSCULAR | Status: AC
  Administered 2024-02-08: 7 mL via INTRADERMAL

## 2024-02-08 MED ORDER — LEVOFLOXACIN IN D5W 500 MG/100ML IV SOLN
500.0000 mg | Freq: Once | INTRAVENOUS | Status: AC
  Administered 2024-02-08: 500 mg via INTRAVENOUS
  Filled 2024-02-08: qty 100

## 2024-02-08 MED ORDER — IOHEXOL 300 MG/ML  SOLN
50.0000 mL | Freq: Once | INTRAMUSCULAR | Status: AC | PRN
Start: 2024-02-08 — End: 2024-02-08
  Administered 2024-02-08: 20 mL

## 2024-02-08 MED ORDER — SODIUM CHLORIDE 0.9 % IV SOLN: INTRAVENOUS | Status: DC

## 2024-02-08 MED ORDER — FENTANYL CITRATE (PF) 100 MCG/2ML IJ SOLN
INTRAMUSCULAR | Status: AC | PRN
  Administered 2024-02-08: 25 ug via INTRAVENOUS
  Administered 2024-02-08: 50 ug via INTRAVENOUS

## 2024-02-08 MED ORDER — MIDAZOLAM HCL 2 MG/2ML IJ SOLN
INTRAMUSCULAR | Status: AC
  Filled 2024-02-08: qty 4

## 2024-02-08 MED ORDER — LIDOCAINE HCL 1 % IJ SOLN
INTRAMUSCULAR | Status: AC
  Filled 2024-02-08: qty 20

## 2024-02-08 MED ORDER — FENTANYL CITRATE (PF) 100 MCG/2ML IJ SOLN
INTRAMUSCULAR | Status: AC
Start: 2024-02-08 — End: ?
  Filled 2024-02-08: qty 2

## 2024-02-08 NOTE — Procedures (Signed)
 Interventional Radiology Procedure Note  Procedure: Biliary drain check and exchange   Findings: Please refer to procedural dictation for full description. Sheath cholangiogram demonstrates very short residual common bile duct. Mild left intrahepatic biliary ductal dilation.  Complications: None immediate  Estimated Blood Loss: < 5 mL  Recommendations: Keep capped.  IR will arrange for relatively short term repeat attempt at internal biliary stent placement.  Adequately sized stents not in stock at today's visit.   Creasie Doctor, MD

## 2024-02-08 NOTE — Discharge Instructions (Addendum)
 Please call Interventional Radiology clinic [phone] with any questions or concerns.  You may remove your dressing and shower tomorrow.   After the procedure, it is common to have: Pain or soreness at the catheter insertion site. Tiredness and sleepiness for several hours. Some bruising at the catheter insertion site. Drainage into the collection bag on the outside of your body, if you have an external drainage catheter or an internal-external drainage catheter. You might see bloody discharge in the bag for the first 1 or 2 days. Then, the discharge should turn a yellow-green color.  Follow these instructions at home:  Medication: Do not use Aspirin  or ibuprofen  products, such as Advil  or Motrin , as it may increase bleeding.  You may resume your usual medications as ordered by your doctor If your doctor prescribed antibiotics, take them as directed. Do not stop taking them just because you feel better. You need to take the full course of antibiotics  Eating and drinking: Drink plenty of liquids to keep your urine pale yellow You can resume your regular diet as directed by your doctor    Care of the procedure site Clean your catheter insertion site as told by your health care provider Do not take baths, swim, or use a hot tub until your health care provider approves. Take showers only Before showering, cover your catheter insertion site with a watertight covering to keep the area dry Keep the skin around your catheter insertion site dry. If the area gets wet, dry the skin completely.  Check your catheter insertion site every day for signs of infection. Check for:  o More redness, swelling, or pain  o Fluid or blood  o Warmth  o Pus or a bad smell   Activity Rest for the remainder of the day Return to your normal activities as told by your health care provider. Ask your health care provider what activities are safe for you Keep all follow-up visits as told by your health care  provider. This is important  Contact a health care provider if: Your pain gets worse after it had improved, and it is not relieved with pain medicines You have any questions about caring for your drainage catheter or collection bag The skin around your catheter insertion site breaks down You have any of these signs of infection around your catheter insertion site:  o More redness, swelling, or pain  o Fluid or blood  o Warmth  o Pus or a bad smell  Get help right away if:  You have a fever or chills  You have bile leaking around your drainage catheter  Your drainage catheter becomes blocked or clogged  Your drainage catheter comes out After the procedure, it is common to have:  Pain or soreness at the catheter insertion site. Tiredness and sleepiness for several hours. Some bruising at the catheter insertion site. Drainage into the collection bag on the outside of your body, if you have an external drainage catheter or an internal-external drainage catheter. You might see bloody discharge in the bag for the first 1 or 2 days. Then, the discharge should turn a yellow-green color.                                                         Moderate Conscious Sedation-Care After  This sheet gives  you information about how to care for yourself after your procedure. Your health care provider may also give you more specific instructions. If you have problems or questions, contact your health care provider.  After the procedure, it is common to have: Sleepiness for several hours. Impaired judgment for several hours. Difficulty with balance. Vomiting if you eat too soon.  Follow these instructions at home:  Rest. Do not participate in activities where you could fall or become injured. Do not drive or use machinery. Do not drink alcohol. Do not take sleeping pills or medicines that cause drowsiness. Do not make important decisions or sign legal documents. Do not take care of children  on your own.  Eating and drinking Follow the diet recommended by your health care provider. Drink enough fluid to keep your urine pale yellow. If you vomit: Drink water, juice, or soup when you can drink without vomiting. Make sure you have little or no nausea before eating solid foods.  General instructions Take over-the-counter and prescription medicines only as told by your health care provider. Have a responsible adult stay with you for the time you are told. It is important to have someone help care for you until you are awake and alert. Do not smoke. Keep all follow-up visits as told by your health care provider. This is important.  Contact a health care provider if: You are still sleepy or having trouble with balance after 24 hours. You feel light-headed. You keep feeling nauseous or you keep vomiting. You develop a rash. You have a fever. You have redness or swelling around the IV site.  Get help right away if: You have trouble breathing. You have new-onset confusion at home.  This information is not intended to replace advice given to you by your health care provider. Make sure you discuss any questions you have with your healthcare provider.

## 2024-02-09 ENCOUNTER — Other Ambulatory Visit (HOSPITAL_COMMUNITY): Payer: Self-pay | Admitting: Interventional Radiology

## 2024-02-09 ENCOUNTER — Encounter (HOSPITAL_COMMUNITY): Payer: Self-pay | Admitting: Radiology

## 2024-02-09 DIAGNOSIS — K831 Obstruction of bile duct: Secondary | ICD-10-CM

## 2024-02-13 ENCOUNTER — Other Ambulatory Visit: Payer: Self-pay | Admitting: Oncology

## 2024-02-15 ENCOUNTER — Encounter: Payer: Self-pay | Admitting: Oncology

## 2024-02-17 ENCOUNTER — Inpatient Hospital Stay

## 2024-02-17 ENCOUNTER — Inpatient Hospital Stay: Admitting: Oncology

## 2024-02-21 ENCOUNTER — Inpatient Hospital Stay: Admitting: Oncology

## 2024-02-21 ENCOUNTER — Inpatient Hospital Stay

## 2024-02-25 ENCOUNTER — Encounter: Payer: Self-pay | Admitting: Internal Medicine

## 2024-02-25 ENCOUNTER — Ambulatory Visit: Admitting: Internal Medicine

## 2024-02-25 VITALS — BP 126/78 | HR 75 | Temp 98.2°F | Ht 71.0 in | Wt 170.0 lb

## 2024-02-25 DIAGNOSIS — R7302 Impaired glucose tolerance (oral): Secondary | ICD-10-CM | POA: Diagnosis not present

## 2024-02-25 DIAGNOSIS — E559 Vitamin D deficiency, unspecified: Secondary | ICD-10-CM | POA: Diagnosis not present

## 2024-02-25 DIAGNOSIS — C259 Malignant neoplasm of pancreas, unspecified: Secondary | ICD-10-CM | POA: Diagnosis not present

## 2024-02-25 DIAGNOSIS — E7800 Pure hypercholesterolemia, unspecified: Secondary | ICD-10-CM | POA: Diagnosis not present

## 2024-02-25 NOTE — Assessment & Plan Note (Signed)
 Lab Results  Component Value Date   LDLCALC 84 06/24/2022   Stable, pt for lower chol diet, declines statin for now

## 2024-02-25 NOTE — Patient Instructions (Signed)
 Please continue all other medications as before, and refills have been done if requested.  Please have the pharmacy call with any other refills you may need.  Please keep your appointments with your specialists as you may have planned - oncology with lab next week  Please make an Appointment to return in 6 months, or sooner if needed

## 2024-02-25 NOTE — Assessment & Plan Note (Signed)
Lab Results  Component Value Date   HGBA1C 5.1 05/14/2023   Stable, pt to continue current medical treatment  - diet, wt control

## 2024-02-25 NOTE — Progress Notes (Signed)
 Patient ID: TRAMON CRESCENZO, male   DOB: 01/05/1941, 83 y.o.   MRN: [id number]        Chief Complaint: follow up HLD and hyperglycemia , low vit d       HPI:  Charles Lawson is a 83 y.o. male here with wife, overall doing ok, Pt denies chest pain, increased sob or doe, wheezing, orthopnea, PND, increased LE swelling, palpitations, dizziness or syncope.   Pt denies polydipsia, polyuria, or new focal neuro s/s.    Pt denies fever, wt loss, night sweats, loss of appetite, or other constitutional symptoms  Drain remains intact,  Has port to right chest remains.  Successfully finished chemo tx x 12, improved and gained 10 lbs with better diet.   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   Arthritis    Blood pressure elevated without history of HTN    BPH (benign prostatic hyperplasia)    Diverticulosis of colon    ED (erectile dysfunction)    Helicobacter pylori gastritis 04/2023   Tx quad tx and eradication documented neg stool ag 07/2023   History of basal cell carcinoma (BCC) excision    Hyperlipidemia    Impaired glucose tolerance 04/03/2013   Personal history of colonic polyps 06/07/2012   Past Surgical History:  Procedure Laterality Date   Candida Chalk OSTEOTOMY Left 07/19/2020   Procedure: Candida Chalk OSTEOTOMY;  Surgeon: Floyce Hutching, DPM;  Location: [facility] [place] [place];  Service: Podiatry;  Laterality: Left;   BIOPSY  05/10/2023   Procedure: BIOPSY;  Surgeon: Kenney Peacemaker, MD;  Location: [facility];  Service: Gastroenterology;;   BIOPSY  05/12/2023   Procedure: BIOPSY;  Surgeon: Kenney Peacemaker, MD;  Location: [facility];  Service: Gastroenterology;;   Tillman Folks Left 07/19/2020   Procedure:  Adelita Honer;  Surgeon: Floyce Hutching, DPM;  Location: [facility];  Service: Podiatry;  Laterality: Left;   COLONOSCOPY  last one 10-06-2017  dr Willy Harvest   ESOPHAGOGASTRODUODENOSCOPY N/A 05/12/2023   Procedure: ESOPHAGOGASTRODUODENOSCOPY (EGD);  Surgeon: Kenney Peacemaker, MD;  Location: [facility];  Service: Gastroenterology;  Laterality: N/A;   ESOPHAGOGASTRODUODENOSCOPY (EGD) WITH PROPOFOL  N/A 05/10/2023   Procedure: ESOPHAGOGASTRODUODENOSCOPY (EGD) WITH PROPOFOL ;  Surgeon: Kenney Peacemaker, MD;  Location: [facility];  Service: Gastroenterology;  Laterality: N/A;   HAMMER TOE SURGERY Left 07/19/2020   Procedure: HAMMER TOE CORRECTION;  Surgeon: Floyce Hutching, DPM;  Location: [facility] [place];  Service: Podiatry;  Laterality: Left;   INGUINAL HERNIA REPAIR Right 11-21-2008  @ [facility]   IR CHOLANGIOGRAM EXISTING TUBE  01/03/2024   IR EXCHANGE BILIARY DRAIN  12/30/2023   IR EXCHANGE BILIARY DRAIN  02/08/2024   IR IMAGING GUIDED PORT INSERTION  06/18/2023   IR INT EXT BILIARY DRAIN WITH CHOLANGIOGRAM  11/19/2023   IR INT EXT BILIARY DRAIN WITH CHOLANGIOGRAM  12/02/2023   LUMBAR LAMINECTOMY  1974   METATARSAL OSTEOTOMY Left 07/19/2020   Procedure: METATARSAL OSTEOTOMY;  Surgeon: Floyce Hutching, DPM;  Location: [facility] ;  Service: Podiatry;  Laterality: Left;   ORIF TOE FRACTURE Left 07/19/2020   Procedure: OPEN TREATMENT OF  METARSAL PHALANGEAL JOINT DISLOCATION;  Surgeon: Floyce Hutching, DPM;  Location: [facility];  Service: Podiatry;  Laterality: Left;  ANKLE BLOCK WITH THIGH TOURNIQUET    reports that he has never smoked. He has never used smokeless tobacco. He reports current alcohol use. He reports that he does not use drugs. family history includes COPD in his mother; Cancer in his father; Lung cancer in his father. Allergies  Allergen Reactions   Penicillins Other (See Comments)    Unknown childhood reaction   Current  Outpatient Medications on File Prior to Visit  Medication Sig Dispense Refill   acetaminophen  (TYLENOL ) 650 MG CR tablet Take 500 mg by mouth every 8 (eight) hours as needed for pain. 05/27/23: reports during [facility] call taking after recent surgery at [facility]     [Paused] capecitabine  (XELODA ) 500 MG tablet Take #1 tablet (500 mg) in am and #2 tablets (1000 mg) in pm. Take for 21 days, then hold for 7 days. Repeat every 28 days. Start cycle on 11/29/2023 63 tablet 0   Cyanocobalamin  (VITAMIN B-12 PO) Take 1 tablet by mouth at bedtime.     lidocaine -prilocaine  (EMLA ) cream Apply 1 Application topically as needed. 30 g 2   lipase/protease/amylase (CREON ) 12000-38000 units CPEP capsule Take 1 capsule (12,000 Units total) by mouth 3 (three) times daily before meals. 300 capsule 0   omeprazole  (PRILOSEC ) 20 MG capsule Take 1 capsule (20 mg total) by mouth daily. 90 capsule 3   potassium chloride  (KLOR-CON  M) 10 MEQ tablet TAKE 1 TABLET BY MOUTH 2 TIMES DAILY. 180 tablet 0   tamsulosin  (FLOMAX ) 0.4 MG CAPS capsule Take 1 capsule (0.4 mg total) by mouth daily. 05/27/23: reports during [facility] call taking after recent surgery at [facility] 90 capsule 3   fluconazole  (DIFLUCAN ) 100 MG tablet Take 1 tablet (100 mg total) by mouth daily. (Patient not taking: Reported on 02/25/2024) 5 tablet 0   ondansetron  (ZOFRAN ) 8 MG tablet Take 1 tablet (8 mg total) by mouth every 8 (eight) hours as needed for nausea. (Patient not taking: Reported on 01/06/2024) 30 tablet 1   oxycodone  (OXY-IR) 5 MG capsule Take 5 mg by mouth every 6 (six) hours as needed for pain. (Patient not taking: Reported on 02/25/2024)     prochlorperazine  (COMPAZINE ) 10 MG tablet Take 1 tablet (10 mg total) by mouth every 6 (six) hours as needed for nausea or vomiting. (Patient not taking: Reported on 11/29/2023) 60 tablet 1   Current Facility-Administered Medications on File Prior to Visit  Medication Dose Route Frequency Provider Last Rate Last Admin   sodium chloride   flush (NS) 0.9 % injection 10 mL  10 mL Intravenous PRN Sumner Ends, MD   10 mL at 10/18/23 1310        ROS:  All others reviewed and negative.  Objective        PE:  BP 126/78 (BP Location: Left Arm, Patient Position: Sitting, Cuff Size: Normal)   Pulse 75   Temp 98.2 F (36.8 C) (Oral)   Ht 5\' 11"  (1.803 m)   Wt 170 lb (77.1 kg)   SpO2 98%   BMI 23.71 kg/m                 Constitutional: Pt appears in NAD               HENT: Head: NCAT.                Right Ear: External ear  normal.                 Left Ear: External ear normal.                Eyes: . Pupils are equal, round, and reactive to light. Conjunctivae and EOM are normal               Nose: without d/c or deformity               Neck: Neck supple. Gross normal ROM               Cardiovascular: Normal rate and regular rhythm.                 Pulmonary/Chest: Effort normal and breath sounds without rales or wheezing.                Abd:  Soft, NT, ND, + BS, no organomegaly               Neurological: Pt is alert. At baseline orientation, motor grossly intact               Skin: Skin is warm. No rashes, no other new lesions, LE edema - none               Psychiatric: Pt behavior is normal without agitation   Micro: none  Cardiac tracings I have personally interpreted today:  none  Pertinent Radiological findings (summarize): none   Lab Results  Component Value Date   WBC 6.8 02/08/2024   HGB 14.3 02/08/2024   HCT 44.3 02/08/2024   PLT 158 02/08/2024   GLUCOSE 106 (H) 02/08/2024   CHOL 144 06/24/2022   TRIG 80.0 06/24/2022   HDL 43.60 06/24/2022   LDLDIRECT 88.0 04/02/2020   LDLCALC 84 06/24/2022   ALT 36 02/08/2024   AST 44 (H) 02/08/2024   NA 138 02/08/2024   K 3.6 02/08/2024   CL 105 02/08/2024   CREATININE 0.75 02/08/2024   BUN 12 02/08/2024   CO2 24 02/08/2024   TSH 1.44 06/24/2022   PSA 4.99 (H) 06/23/2021   INR 1.1 02/08/2024   HGBA1C 5.1 05/14/2023   Assessment/Plan:  DAISHAUN AYRE is a  83 y.o. 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  Hyperlipidemia Lab Results  Component Value Date   LDLCALC 84 06/24/2022   Stable, pt for lower chol diet, declines statin for now   Impaired glucose tolerance Lab Results  Component Value Date   HGBA1C 5.1 05/14/2023   Stable, pt to continue current medical treatment  - diet,wt control   Vitamin D  deficiency Last vitamin D  Lab Results  Component Value Date   VD25OH 34.08 06/24/2022   Low, to start oral replacement   Malignant neoplasm of pancreas (HCC) Doing well at this time, gained 10 lbs., to f/u oncology as planned  Followup: Return in about 6 months (around 08/26/2024).  Rosalia Colonel, MD 02/25/2024 9:24 PM [facility]

## 2024-02-25 NOTE — Assessment & Plan Note (Signed)
Last vitamin D Lab Results  Component Value Date   VD25OH 34.08 06/24/2022   Low, to start oral replacement

## 2024-02-25 NOTE — Assessment & Plan Note (Signed)
 Doing well at this time, gained 10 lbs., to f/u oncology as planned

## 2024-03-01 ENCOUNTER — Inpatient Hospital Stay

## 2024-03-01 ENCOUNTER — Inpatient Hospital Stay: Attending: Nurse Practitioner

## 2024-03-01 ENCOUNTER — Inpatient Hospital Stay (HOSPITAL_BASED_OUTPATIENT_CLINIC_OR_DEPARTMENT_OTHER): Admitting: Oncology

## 2024-03-01 VITALS — BP 129/69 | HR 76 | Temp 98.2°F | Resp 18 | Ht 71.0 in | Wt 170.8 lb

## 2024-03-01 DIAGNOSIS — C241 Malignant neoplasm of ampulla of Vater: Secondary | ICD-10-CM

## 2024-03-01 LAB — CMP (CANCER CENTER ONLY)
ALT: 33 U/L (ref 0–44)
AST: 38 U/L (ref 15–41)
Albumin: 3.9 g/dL (ref 3.5–5.0)
Alkaline Phosphatase: 151 U/L — ABNORMAL HIGH (ref 38–126)
Anion gap: 10 (ref 5–15)
BUN: 11 mg/dL (ref 8–23)
CO2: 24 mmol/L (ref 22–32)
Calcium: 9.3 mg/dL (ref 8.9–10.3)
Chloride: 105 mmol/L (ref 98–111)
Creatinine: 0.85 mg/dL (ref 0.61–1.24)
GFR, Estimated: >60 mL/min (ref >60)
Glucose, Bld: 119 mg/dL — ABNORMAL HIGH (ref 70–99)
Potassium: 4.5 mmol/L (ref 3.5–5.1)
Sodium: 139 mmol/L (ref 135–145)
Total Bilirubin: 0.6 mg/dL (ref 0.0–1.2)
Total Protein: 6.8 g/dL (ref 6.5–8.1)

## 2024-03-01 NOTE — Progress Notes (Signed)
 [facility] OFFICE PROGRESS NOTE   Diagnosis: Ampullary carcinoma  INTERVAL HISTORY:   Mr. Wissing returns as scheduled.  Good appetite.  No fever or chills.  He feels well.  The biliary drain mains in place.  He is scheduled for biliary stent placement in approximately 2 weeks.  Objective:  Vital signs in last 24 hours:  Blood pressure 129/69, pulse 76, temperature 98.2 F (36.8 C), temperature source Temporal, resp. rate 18, height 5' 11 (1.803 m), weight 170 lb 12.8 oz (77.5 kg), SpO2 100%.     Lymphatics: No cervical, supraclavicular, axillary, or inguinal nodes Resp: Lungs clear bilaterally Cardio: Regular rate and rhythm GI: No hepatosplenomegaly, no mass, nontender, right upper quadrant biliary drain site without evidence of infection Vascular: No leg edema   Portacath/PICC-without erythema  Lab Results:  Lab Results  Component Value Date   WBC 6.8 02/08/2024   HGB 14.3 02/08/2024   HCT 44.3 02/08/2024   MCV 94.3 02/08/2024   PLT 158 02/08/2024   NEUTROABS 3.9 02/08/2024    CMP  Lab Results  Component Value Date   NA 138 02/08/2024   K 3.6 02/08/2024   CL 105 02/08/2024   CO2 24 02/08/2024   GLUCOSE 106 (H) 02/08/2024   BUN 12 02/08/2024   CREATININE 0.75 02/08/2024   CALCIUM  8.9 02/08/2024   PROT 6.8 02/08/2024   ALBUMIN 3.2 (L) 02/08/2024   AST 44 (H) 02/08/2024   ALT 36 02/08/2024   ALKPHOS 152 (H) 02/08/2024   BILITOT 1.1 02/08/2024   GFRNONAA >60 02/08/2024   GFRAA  11/19/2008    >60        The eGFR has been calculated using the MDRD equation. This calculation has not been validated in all clinical situations. eGFR's persistently <60 mL/min signify possible Chronic Kidney Disease.    Lab Results  Component Value Date   BJY782 12 06/23/2023     Medications: I have reviewed the patient's current medications.   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 (pT3b,pN1), lymphovascular and perineural invasion present, negative resection margins-closest margins or anterior and posterior peripancreatic soft tissue surfaces at 0.3 cm Elevated CA 19-9 Cycle 1 day 1 gemcitabine /capecitabine  06/23/2023, day 15 gemcitabine  07/06/2023 Cycle 2-day 1 gemcitabine /capecitabine  07/21/2023, day 15 gemcitabine  08/04/2023 Cycle 3-day 1 gemcitabine  08/18/2023 (capecitabine  08/20/2023 dose reduced), day 15 gemcitabine  09/01/2023 Cycle 4-day 1 gemcitabine /capecitabine  09/20/2023, day 15 gemcitabine  10/04/2023 CT abdomen/pelvis 10/09/2023:-No evidence of recurrent pancreatic cancer, mild enhancement of the common hepatic duct leading up to the enteric anastomosis, mild biliary duct dilation in the left hepatic lobe Cycle 5-day 1 gemcitabine /capecitabine  11/01/2023; day 15 Gemcitabine  held 11/15/2023 due to elevated liver enzymes, last capecitabine  given a.m. 11/17/2023 Cycle 6-day 1 gemcitabine /capecitabine  11/29/2023, day 15 Gemcitabine  12/13/2023 Duodenal obstruction August 2024 secondary to #1 H. pylori gastritis 05/10/2023-we will confirm treatment plan with Dr. Willy Harvest Skin rash at the forearms and posterior neck related to Xeloda  Right foot drop-MRI lumbar spine 07/16/2023 with no evidence of metastatic disease.  L2-L3 mild spinal canal stenosis.  L3-L4 mild to moderate left neural foraminal narrowing.  L5-S1 mild bilateral neural foraminal narrowing.  Narrowing of the left lateral recess at L1-L2. Elevated liver enzymes January 2025 MRI/MRCP 10/22/2023: No evidence of metastatic disease, mild intrahepatic biliary dilation, uniform thickening of the common bile duct with enhancement Liver enzymes normal 10/27/2023 7.  Admission 11/17/2023 with obstructive jaundice/cholangitis CT abdomen/pelvis 11/17/2023: Increased Intermatic biliary duct dilation terminating in prominent soft tissue in  the porta hepatis MRI abdomen/MRCP  11/18/2023-progressive moderate intrahepatic and extrahepatic biliary duct dilation tapering at the common duct anastomosis, persistent spiculated soft tissue at the anastomotic site 11/19/2023-image guided percutaneous cholangiogram with internal/external biliary drain placement Biliary drain exchange 12/02/2023 Biliary drain exchange 12/30/2023 Right chest pain following biliary drain exchange 12/30/2023 IR cholangiogram 01/03/2024: Biliary drain well-positioned and patent CT chest 01/03/2024: Negative for pulmonary embolism, small to moderate amount of loculated right pleural fluid with passive atelectasis at the right lower lobe       Disposition: Mr Caselli 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  He developed obstructive jaundice/cholangitis in February.  A biliary drain remains in place.  He is scheduled for placement of a biliary stent in approximately 2 weeks. The plan is to continue observation.  A Port-A-Cath remains in place.  He will return for an office visit and Port-A-Cath flush/labs in 8 weeks.  We will check the CA 19-9 when he is here in 8 weeks.  Coni Deep, MD  03/01/2024  11:01 AM

## 2024-03-03 ENCOUNTER — Other Ambulatory Visit: Payer: Self-pay

## 2024-03-13 ENCOUNTER — Other Ambulatory Visit: Payer: Self-pay | Admitting: Radiology

## 2024-03-13 DIAGNOSIS — K831 Obstruction of bile duct: Secondary | ICD-10-CM

## 2024-03-13 NOTE — H&P (Incomplete)
 Chief Complaint: Ampullary carcinoma status post pancreaticoduodenectomy 04/2023 complicated postoperatively by biliary obstruction, status post right-sided percutaneous biliary drain placement on 11/19/2023; referred for percutaneous cholangiogram and possible stent placement.  Referring Provider(s): Sherrill,B  Supervising Physician: Jennefer Rover  Patient Status: [facility] - Out-pt  History of Present Illness: Charles Lawson is an 83 y.o. male with PMH sig for HLD, skin cancer, diverticulosis, BPH, arthritis, and ampullary carcinoma status post pancreaticoduodenectomy 04/2023 complicated postoperatively by biliary obstruction, status post right-sided percutaneous biliary drain placement on 11/19/2023. He is s/p follow-up cholangiogram on 02/08/2024 which demonstrated no evidence of significant focal stenosis, however there was persistent mild bilateral left greater than right intrahepatic biliary ductal dilatation suggestive of a degree of residual stenosis. He  underwent 12 fr biliary drain exchange at that time and was scheduled for biliary stent placement ,however appropriate biliary stent sizes were not available . His drain was capped. He presents again today for cholangiogram with attempted biliary stent placement.   *** Patient is Full Code  Past Medical History:  Diagnosis Date   Allergic rhinitis, 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   Arthritis    Blood pressure elevated without history of HTN    BPH (benign prostatic hyperplasia)    Diverticulosis of colon    ED (erectile dysfunction)    Helicobacter pylori gastritis 04/2023   Tx quad tx and eradication documented neg stool ag 07/2023   History of basal cell carcinoma (BCC) excision    Hyperlipidemia    Impaired glucose tolerance 04/03/2013   Personal history of colonic polyps 06/07/2012    Past Surgical History:  Procedure Laterality Date   KATRINA OSTEOTOMY Left 07/19/2020   Procedure: KATRINA OSTEOTOMY;  Surgeon:  Silva Juliene SAUNDERS, DPM;  Location: [facility];  Service: Podiatry;  Laterality: Left;   BIOPSY  05/10/2023   Procedure: BIOPSY;  Surgeon: Avram Lupita BRAVO, MD;  Location: [facility];  Service: Gastroenterology;;   BIOPSY  05/12/2023   Procedure: BIOPSY;  Surgeon: Avram Lupita BRAVO, MD;  Location: [facility];  Service: Gastroenterology;;   ROMAYNE Left 07/19/2020   Procedure: OLINDA ROMAYNE;  Surgeon: Silva Juliene SAUNDERS, DPM;  Location: [facility];  Service: Podiatry;  Laterality: Left;   COLONOSCOPY  last one 10-06-2017  dr avram   ESOPHAGOGASTRODUODENOSCOPY N/A 05/12/2023   Procedure: ESOPHAGOGASTRODUODENOSCOPY (EGD);  Surgeon: Avram Lupita BRAVO, MD;  Location: [facility];  Service: Gastroenterology;  Laterality: N/A;   ESOPHAGOGASTRODUODENOSCOPY (EGD) WITH PROPOFOL  N/A 05/10/2023   Procedure: ESOPHAGOGASTRODUODENOSCOPY (EGD) WITH PROPOFOL ;  Surgeon: Avram Lupita BRAVO, MD;  Location: [facility];  Service: Gastroenterology;  Laterality: N/A;   HAMMER TOE SURGERY Left 07/19/2020   Procedure: HAMMER TOE CORRECTION;  Surgeon: Silva Juliene SAUNDERS, DPM;  Location: [facility];  Service: Podiatry;  Laterality: Left;   INGUINAL HERNIA REPAIR Right 11-21-2008  @ [facility]   IR CHOLANGIOGRAM EXISTING TUBE  01/03/2024   IR EXCHANGE BILIARY DRAIN  12/30/2023   IR EXCHANGE BILIARY DRAIN  02/08/2024   IR IMAGING GUIDED PORT INSERTION  06/18/2023   IR INT EXT BILIARY DRAIN WITH CHOLANGIOGRAM  11/19/2023   IR INT EXT BILIARY DRAIN WITH CHOLANGIOGRAM  12/02/2023   LUMBAR LAMINECTOMY  1974   METATARSAL OSTEOTOMY Left 07/19/2020   Procedure: METATARSAL OSTEOTOMY;  Surgeon: Silva Juliene SAUNDERS, DPM;  Location: [facility];  Service: Podiatry;  Laterality: Left;   ORIF TOE FRACTURE Left 07/19/2020   Procedure: OPEN TREATMENT OF METARSAL PHALANGEAL JOINT DISLOCATION;  Surgeon: Silva Juliene SAUNDERS, DPM;  Location: [facility];  Service: Podiatry;   Laterality: Left;  ANKLE BLOCK WITH THIGH TOURNIQUET    Allergies: Penicillins  Medications: Prior to Admission medications   Medication Sig Start Date End Date Taking? Authorizing Provider  acetaminophen  (TYLENOL ) 650 MG CR tablet Take 500 mg by mouth every 8 (eight) hours as needed for pain. 05/27/23: reports during [facility] call taking after recent surgery at [facility]    Norleen Lynwood ORN, MD  capecitabine  (XELODA ) 500 MG tablet Take #1 tablet (500 mg) in am and #2 tablets (1000 mg) in pm. Take for 21 days, then hold for 7 days. Repeat every 28 days. Start cycle on 11/29/2023 11/15/23   Thomas, Lisa K, NP  Cyanocobalamin  (VITAMIN B-12 PO) Take 1 tablet by mouth at bedtime.    [provider]  lidocaine -prilocaine  (EMLA ) cream Apply 1 Application topically as needed. 11/15/23   Debby Olam POUR, NP  lipase/protease/amylase (CREON ) 12000-38000 units CPEP capsule Take 1 capsule (12,000 Units total) by mouth 3 (three) times daily before meals. 01/19/24   Cloretta Arley NOVAK, MD  omeprazole  (PRILOSEC ) 20 MG capsule Take 1 capsule (20 mg total) by mouth daily. 08/16/23   Norleen Lynwood ORN, MD  potassium chloride  (KLOR-CON  M) 10 MEQ tablet TAKE 1 TABLET BY MOUTH 2 TIMES DAILY. 02/15/24   Cloretta Arley NOVAK, MD  tamsulosin  (FLOMAX ) 0.4 MG CAPS capsule Take 1 capsule (0.4 mg total) by mouth daily. 05/27/23: reports during [facility] call taking after recent surgery at [facility] 11/22/23   Norleen Lynwood ORN, MD     Family History  Problem Relation Age of Onset   COPD Mother    Cancer Father    Lung cancer Father    Colon cancer Neg Hx    Colon polyps Neg Hx    Rectal cancer Neg Hx    Stomach cancer Neg Hx     Social History   Socioeconomic History   Marital status: Married    Spouse name: Not on file   Number of children: 1   Years of education: Not on file   Highest education level: Not on file  Occupational History   Occupation: Retired  Tobacco Use   Smoking status: Never   Smokeless tobacco: Never  Vaping Use    Vaping status: Never Used  Substance and Sexual Activity   Alcohol use: Yes    Comment: occaional   Drug use: Never   Sexual activity: Yes  Other Topics Concern   Not on file  Social History Narrative   Not on file   Social Drivers of Health   Financial Resource Strain: Low Risk  (08/09/2023)   Overall Financial Resource Strain (CARDIA)    Difficulty of Paying Living Expenses: Not hard at all  Food Insecurity: No Food Insecurity (11/18/2023)   Hunger Vital Sign    Worried About Running Out of Food in the Last Year: Never true    Ran Out of Food in the Last Year: Never true  Transportation Needs: No Transportation Needs (11/18/2023)   PRAPARE - Administrator, Civil Service (Medical): No    Lack of Transportation (Non-Medical): No  Physical Activity: Inactive (08/11/2022)   Exercise Vital Sign    Days of Exercise per Week: 0 days    Minutes of Exercise per Session: 0 min  Stress: No Stress Concern Present (08/09/2023)   [employer] of Occupational Health - Occupational Stress Questionnaire    Feeling of Stress :  Not at all  Social Connections: Moderately Integrated (11/18/2023)   Social Connection and Isolation Panel    Frequency of Communication with Friends and Family: More than three times a week    Frequency of Social Gatherings with Friends and Family: Twice a week    Attends Religious Services: More than 4 times per year    Active Member of [employer] or Organizations: No    Attends Banker Meetings: Never    Marital Status: Married       Review of Systems  Vital Signs:   Advance Care Plan: no documents on file  Physical Exam  Imaging: No results found.  Labs:  CBC: Recent Labs    12/13/23 0829 12/30/23 1305 01/06/24 1510 02/08/24 1000  WBC 5.5 6.2 11.4* 6.8  HGB 13.1 13.7 13.8 14.3  HCT 38.8* 41.2 40.1 44.3  PLT 166 215 339 158    COAGS: Recent Labs    11/19/23 1032 12/30/23 1305 02/08/24 1000  INR 1.5* 1.1 1.1     BMP: Recent Labs    12/30/23 1305 01/06/24 1510 02/08/24 1000 03/01/24 0953  NA 137 135 138 139  K 4.0 4.3 3.6 4.5  CL 106 100 105 105  CO2 24 28 24 24   GLUCOSE 103* 110* 106* 119*  BUN 14 13 12 11   CALCIUM  9.1 9.4 8.9 9.3  CREATININE 0.77 0.63 0.75 0.85  GFRNONAA >60 >60 >60 >60    LIVER FUNCTION TESTS: Recent Labs    12/30/23 1305 01/06/24 1510 02/08/24 1000 03/01/24 0953  BILITOT 1.9* 1.1 1.1 0.6  AST 40 31 44* 38  ALT 29 27 36 33  ALKPHOS 165* 219* 152* 151*  PROT 6.6 7.0 6.8 6.8  ALBUMIN 3.3* 3.3* 3.2* 3.9    TUMOR MARKERS: No results for input(s): AFPTM, CEA, CA199, CHROMGRNA in the last 8760 hours.  Assessment and Plan: 83 y.o. male with PMH sig for HLD, skin cancer, diverticulosis, BPH, arthritis, and ampullary carcinoma status post pancreaticoduodenectomy 04/2023 complicated postoperatively by biliary obstruction, status post right-sided percutaneous biliary drain placement on 11/19/2023. He is s/p follow-up cholangiogram on 02/08/2024 which demonstrated no evidence of significant focal stenosis, however there was persistent mild bilateral left greater than right intrahepatic biliary ductal dilatation suggestive of a degree of residual stenosis. He  underwent 12 fr biliary drain exchange at that time and was scheduled for biliary stent placement ,however appropriate biliary stent sizes were not available . His drain was capped. He presents again today for cholangiogram with attempted biliary stent placement. Risks and benefits discussed with the patient including bleeding, infection, damage to adjacent structures, inability to place stent and sepsis.  All of the patient's questions were answered, patient is agreeable to proceed. Consent signed and in chart.    Thank you for allowing our service to participate in ZANDON TALTON 's care.  Electronically Signed: D. Franky Rakers, PA-C   03/13/2024, 3:26 PM      I spent a total of  20 minutes   in  face to face in clinical consultation, greater than 50% of which was counseling/coordinating care for cholangiogram with biliary stent placement

## 2024-03-14 ENCOUNTER — Ambulatory Visit (HOSPITAL_COMMUNITY)
Admission: RE | Admit: 2024-03-14 | Discharge: 2024-03-14 | Disposition: A | Discharge status: Home / Self Care | Source: Ambulatory Visit | Attending: Interventional Radiology

## 2024-03-14 ENCOUNTER — Other Ambulatory Visit (HOSPITAL_COMMUNITY): Payer: Self-pay | Admitting: Interventional Radiology

## 2024-03-14 ENCOUNTER — Ambulatory Visit (HOSPITAL_COMMUNITY)
Admission: RE | Admit: 2024-03-14 | Discharge: 2024-03-14 | Disposition: A | Discharge status: Home / Self Care | Source: Ambulatory Visit | Attending: Interventional Radiology | Admitting: Interventional Radiology

## 2024-03-14 ENCOUNTER — Encounter (HOSPITAL_COMMUNITY): Payer: Self-pay

## 2024-03-14 DIAGNOSIS — K831 Obstruction of bile duct: Secondary | ICD-10-CM | POA: Diagnosis not present

## 2024-03-14 DIAGNOSIS — Z434 Encounter for attention to other artificial openings of digestive tract: Secondary | ICD-10-CM | POA: Diagnosis not present

## 2024-03-14 DIAGNOSIS — K869 Disease of pancreas, unspecified: Secondary | ICD-10-CM | POA: Diagnosis not present

## 2024-03-14 DIAGNOSIS — E785 Hyperlipidemia, unspecified: Secondary | ICD-10-CM | POA: Insufficient documentation

## 2024-03-14 DIAGNOSIS — Z8509 Personal history of malignant neoplasm of other digestive organs: Secondary | ICD-10-CM | POA: Diagnosis not present

## 2024-03-14 DIAGNOSIS — N400 Enlarged prostate without lower urinary tract symptoms: Secondary | ICD-10-CM | POA: Diagnosis not present

## 2024-03-14 HISTORY — PX: IR BILIARY STENT(S) EXIST ACCESS INC DILAT CATH EXCH/REMOVAL: IMG6048

## 2024-03-14 LAB — CBC WITH DIFFERENTIAL/PLATELET
Abs Immature Granulocytes: 0.01 10*3/uL (ref 0.00–0.07)
Basophils Absolute: 0 10*3/uL (ref 0.0–0.1)
Basophils Relative: 0 %
Eosinophils Absolute: 0.1 10*3/uL (ref 0.0–0.5)
Eosinophils Relative: 2 %
HCT: 45 % (ref 39.0–52.0)
Hemoglobin: 14.8 g/dL (ref 13.0–17.0)
Immature Granulocytes: 0 %
Lymphocytes Relative: 33 %
Lymphs Abs: 2.3 10*3/uL (ref 0.7–4.0)
MCH: 29 pg (ref 26.0–34.0)
MCHC: 32.9 g/dL (ref 30.0–36.0)
MCV: 88.2 fL (ref 80.0–100.0)
Monocytes Absolute: 0.8 10*3/uL (ref 0.1–1.0)
Monocytes Relative: 11 %
Neutro Abs: 3.7 10*3/uL (ref 1.7–7.7)
Neutrophils Relative %: 54 %
Platelets: 186 10*3/uL (ref 150–400)
RBC: 5.1 MIL/uL (ref 4.22–5.81)
RDW: 13 % (ref 11.5–15.5)
WBC: 6.9 10*3/uL (ref 4.0–10.5)
nRBC: 0 % (ref 0.0–0.2)

## 2024-03-14 LAB — COMPREHENSIVE METABOLIC PANEL WITH GFR
ALT: 50 U/L — ABNORMAL HIGH (ref 0–44)
AST: 46 U/L — ABNORMAL HIGH (ref 15–41)
Albumin: 3.6 g/dL (ref 3.5–5.0)
Alkaline Phosphatase: 177 U/L — ABNORMAL HIGH (ref 38–126)
Anion gap: 10 (ref 5–15)
BUN: 16 mg/dL (ref 8–23)
CO2: 25 mmol/L (ref 22–32)
Calcium: 9.1 mg/dL (ref 8.9–10.3)
Chloride: 106 mmol/L (ref 98–111)
Creatinine, Ser: 0.75 mg/dL (ref 0.61–1.24)
GFR, Estimated: >60 mL/min (ref >60)
Glucose, Bld: 109 mg/dL — ABNORMAL HIGH (ref 70–99)
Potassium: 4 mmol/L (ref 3.5–5.1)
Sodium: 141 mmol/L (ref 135–145)
Total Bilirubin: 1.1 mg/dL (ref 0.0–1.2)
Total Protein: 6.9 g/dL (ref 6.5–8.1)

## 2024-03-14 LAB — PROTIME-INR
INR: 1.1 (ref 0.8–1.2)
Prothrombin Time: 13.9 s (ref 11.4–15.2)

## 2024-03-14 MED ORDER — MIDAZOLAM HCL 2 MG/2ML IJ SOLN
INTRAMUSCULAR | Status: AC | PRN
  Administered 2024-03-14: 1 mg via INTRAVENOUS

## 2024-03-14 MED ORDER — FENTANYL CITRATE (PF) 100 MCG/2ML IJ SOLN
INTRAMUSCULAR | Status: AC | PRN
  Administered 2024-03-14: 50 ug via INTRAVENOUS

## 2024-03-14 MED ORDER — MIDAZOLAM HCL 2 MG/2ML IJ SOLN
INTRAMUSCULAR | Status: AC
  Filled 2024-03-14: qty 2

## 2024-03-14 MED ORDER — LEVOFLOXACIN IN D5W 500 MG/100ML IV SOLN
500.0000 mg | Freq: Once | INTRAVENOUS | Status: AC
  Administered 2024-03-14: 500 mg via INTRAVENOUS
  Filled 2024-03-14: qty 100

## 2024-03-14 MED ORDER — SODIUM CHLORIDE 0.9 % IV SOLN: INTRAVENOUS | Status: DC

## 2024-03-14 MED ORDER — FENTANYL CITRATE (PF) 100 MCG/2ML IJ SOLN
INTRAMUSCULAR | Status: AC
  Filled 2024-03-14: qty 2

## 2024-03-14 MED ORDER — LIDOCAINE-EPINEPHRINE 1 %-1:100000 IJ SOLN
INTRAMUSCULAR | Status: AC
  Filled 2024-03-14: qty 1

## 2024-03-14 MED ORDER — IOHEXOL 300 MG/ML  SOLN
50.0000 mL | Freq: Once | INTRAMUSCULAR | Status: AC | PRN
  Administered 2024-03-14: 20 mL

## 2024-03-14 NOTE — Procedures (Signed)
 Interventional Radiology Procedure Note  Procedure:  1) Percutaneous cholangiogram 2) Biliary stent placement  Findings: Please refer to procedural dictation for full description. 8 mm x 20 mm Wallstent placed about hepaticojejunostomy.  Indwelling biliary drain removed.  Complications: None immediate  Estimated Blood Loss: < 5 ml  Recommendations: Expect percutaneous biliary leakage over next 48 hours, should resolve by 72 hours post drain removal. Follow up with IR as needed.   Ester Sides, MD

## 2024-03-23 ENCOUNTER — Other Ambulatory Visit (HOSPITAL_BASED_OUTPATIENT_CLINIC_OR_DEPARTMENT_OTHER): Payer: Self-pay

## 2024-03-23 ENCOUNTER — Other Ambulatory Visit: Payer: Self-pay | Admitting: Oncology

## 2024-03-23 DIAGNOSIS — C241 Malignant neoplasm of ampulla of Vater: Secondary | ICD-10-CM

## 2024-03-27 ENCOUNTER — Other Ambulatory Visit (HOSPITAL_BASED_OUTPATIENT_CLINIC_OR_DEPARTMENT_OTHER): Payer: Self-pay

## 2024-03-27 ENCOUNTER — Other Ambulatory Visit: Payer: Self-pay

## 2024-03-27 MED ORDER — PANCRELIPASE (LIP-PROT-AMYL) 12000-38000 UNITS PO CPEP
12000.0000 [IU] | ORAL_CAPSULE | Freq: Three times a day (TID) | ORAL | 1 refills | Status: AC
  Filled 2024-03-27: qty 200, 67d supply, fill #0
  Filled 2024-03-27: qty 300, 75d supply, fill #0
  Filled 2024-06-23: qty 300, 75d supply, fill #1

## 2024-03-28 ENCOUNTER — Other Ambulatory Visit (HOSPITAL_BASED_OUTPATIENT_CLINIC_OR_DEPARTMENT_OTHER): Payer: Self-pay

## 2024-03-29 ENCOUNTER — Other Ambulatory Visit (HOSPITAL_BASED_OUTPATIENT_CLINIC_OR_DEPARTMENT_OTHER): Payer: Self-pay

## 2024-05-01 ENCOUNTER — Inpatient Hospital Stay (HOSPITAL_BASED_OUTPATIENT_CLINIC_OR_DEPARTMENT_OTHER): Admitting: Oncology

## 2024-05-01 ENCOUNTER — Telehealth: Payer: Self-pay | Admitting: *Deleted

## 2024-05-01 ENCOUNTER — Ambulatory Visit (HOSPITAL_BASED_OUTPATIENT_CLINIC_OR_DEPARTMENT_OTHER)
Admission: RE | Admit: 2024-05-01 | Discharge: 2024-05-01 | Disposition: A | Discharge status: Home / Self Care | Source: Ambulatory Visit | Attending: Oncology | Admitting: Oncology

## 2024-05-01 ENCOUNTER — Inpatient Hospital Stay

## 2024-05-01 ENCOUNTER — Inpatient Hospital Stay: Attending: Nurse Practitioner

## 2024-05-01 VITALS — BP 131/70 | HR 68 | Temp 98.6°F | Resp 18 | Ht 71.0 in | Wt 171.4 lb

## 2024-05-01 DIAGNOSIS — Z85068 Personal history of other malignant neoplasm of small intestine: Secondary | ICD-10-CM | POA: Insufficient documentation

## 2024-05-01 DIAGNOSIS — C241 Malignant neoplasm of ampulla of Vater: Secondary | ICD-10-CM

## 2024-05-01 DIAGNOSIS — J90 Pleural effusion, not elsewhere classified: Secondary | ICD-10-CM | POA: Diagnosis not present

## 2024-05-01 DIAGNOSIS — R918 Other nonspecific abnormal finding of lung field: Secondary | ICD-10-CM | POA: Diagnosis not present

## 2024-05-01 DIAGNOSIS — J9811 Atelectasis: Secondary | ICD-10-CM | POA: Diagnosis not present

## 2024-05-01 LAB — CMP (CANCER CENTER ONLY)
ALT: 25 U/L (ref 0–44)
AST: 28 U/L (ref 15–41)
Albumin: 3.9 g/dL (ref 3.5–5.0)
Alkaline Phosphatase: 103 U/L (ref 38–126)
Anion gap: 9 (ref 5–15)
BUN: 15 mg/dL (ref 8–23)
CO2: 25 mmol/L (ref 22–32)
Calcium: 9.5 mg/dL (ref 8.9–10.3)
Chloride: 104 mmol/L (ref 98–111)
Creatinine: 0.89 mg/dL (ref 0.61–1.24)
GFR, Estimated: >60 mL/min (ref >60)
Glucose, Bld: 96 mg/dL (ref 70–99)
Potassium: 4.4 mmol/L (ref 3.5–5.1)
Sodium: 138 mmol/L (ref 135–145)
Total Bilirubin: 0.4 mg/dL (ref 0.0–1.2)
Total Protein: 6.7 g/dL (ref 6.5–8.1)

## 2024-05-01 NOTE — Progress Notes (Signed)
 [facility] OFFICE PROGRESS NOTE   Diagnosis: Ampullary carcinoma  INTERVAL HISTORY:   Charles Lawson returns as scheduled.  He feels well.  No complaint.  He would like to have the Port-A-Cath removed.  A common bile duct stent was placed on 03/14/2024.  The internal/external biliary drain was removed.  Objective:  Vital signs in last 24 hours:  Blood pressure 131/70, pulse 68, temperature 98.6 F (37 C), temperature source Temporal, resp. rate 18, height 5' 11 (1.803 m), weight 171 lb 6.4 oz (77.7 kg), SpO2 98%.   Lymphatics: No cervical, supraclavicular, axillary, or inguinal nodes Resp: Lungs clear bilaterally Cardio: Regular rate and rhythm GI: No hepatomegaly, nontender, possible left inguinal hernia Vascular: No leg edema    Portacath/PICC-without erythema  Lab Results:  Lab Results  Component Value Date   WBC 6.9 03/14/2024   HGB 14.8 03/14/2024   HCT 45.0 03/14/2024   MCV 88.2 03/14/2024   PLT 186 03/14/2024   NEUTROABS 3.7 03/14/2024    CMP  Lab Results  Component Value Date   NA 138 05/01/2024   K 4.4 05/01/2024   CL 104 05/01/2024   CO2 25 05/01/2024   GLUCOSE 96 05/01/2024   BUN 15 05/01/2024   CREATININE 0.89 05/01/2024   CALCIUM  9.5 05/01/2024   PROT 6.7 05/01/2024   ALBUMIN 3.9 05/01/2024   AST 28 05/01/2024   ALT 25 05/01/2024   ALKPHOS 103 05/01/2024   BILITOT 0.4 05/01/2024   GFRNONAA >60 05/01/2024   GFRAA  11/19/2008    >60        The eGFR has been calculated using the MDRD equation. This calculation has not been validated in all clinical situations. eGFR's persistently <60 mL/min signify possible Chronic Kidney Disease.    Lab Results  Component Value Date   RJW800 12 06/23/2023     Medications: I have reviewed the patient's current medications.   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,pN1),  lymphovascular and perineural invasion present, negative resection margins-closest margins or anterior and posterior peripancreatic soft tissue surfaces at 0.3 cm Elevated CA 19-9 Cycle 1 day 1 gemcitabine /capecitabine  06/23/2023, day 15 gemcitabine  07/06/2023 Cycle 2-day 1 gemcitabine /capecitabine  07/21/2023, day 15 gemcitabine  08/04/2023 Cycle 3-day 1 gemcitabine  08/18/2023 (capecitabine  08/20/2023 dose reduced), day 15 gemcitabine  09/01/2023 Cycle 4-day 1 gemcitabine /capecitabine  09/20/2023, day 15 gemcitabine  10/04/2023 CT abdomen/pelvis 10/09/2023:-No evidence of recurrent pancreatic cancer, mild enhancement of the common hepatic duct leading up to the enteric anastomosis, mild biliary duct dilation in the left hepatic lobe Cycle 5-day 1 gemcitabine /capecitabine  11/01/2023; day 15 Gemcitabine  held 11/15/2023 due to elevated liver enzymes, last capecitabine  given a.m. 11/17/2023 Cycle 6-day 1 gemcitabine /capecitabine  11/29/2023, day 15 Gemcitabine  12/13/2023 Duodenal obstruction August 2024 secondary to #1 H. pylori gastritis 05/10/2023-we will confirm treatment plan with Dr. 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  L2-L3 mild spinal canal stenosis.  L3-L4 mild to moderate left neural foraminal narrowing.  L5-S1 mild bilateral neural foraminal narrowing.  Narrowing of the left lateral recess at L1-L2. Elevated liver enzymes January 2025 MRI/MRCP 10/22/2023: No evidence of metastatic disease, mild intrahepatic biliary dilation, uniform thickening of the common bile duct with enhancement Liver enzymes normal 10/27/2023 7.  Admission 11/17/2023 with obstructive jaundice/cholangitis CT abdomen/pelvis 11/17/2023: Increased Intermatic biliary duct dilation terminating in prominent soft tissue in the porta hepatis MRI abdomen/MRCP 11/18/2023-progressive moderate intrahepatic and extrahepatic biliary  duct dilation tapering at the common duct anastomosis, persistent spiculated soft tissue at the anastomotic site 11/19/2023-image guided percutaneous cholangiogram with internal/external biliary drain placement Biliary drain exchange 12/02/2023 Biliary drain exchange 12/30/2023 Right chest pain following biliary drain exchange 12/30/2023 IR cholangiogram 01/03/2024: Biliary drain well-positioned and patent CT chest 01/03/2024: Negative for pulmonary embolism, small to moderate amount of loculated right pleural fluid with passive atelectasis at the right lower lobe 03/14/2024: Common bile duct stent, removal of internal/external biliary drain      Disposition: 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  We will follow-up on the CA 19-9 from today.  The liver enzymes and bilirubin are normal.  He will be referred for Port-A-Cath removal.  He will return for an office visit and CA 19-9 in 3 months.  We will refer him for a chest x-ray to follow-up on the pleural effusion noted on a CT in April.  Arley Hof, MD  05/01/2024  9:42 AM

## 2024-05-01 NOTE — Telephone Encounter (Addendum)
 Dr. Cloretta recommends a CXR to f/u on right pleural effusion from last CT scan of chest. Order placed by MD. Charles Lawson and requested return call to discuss. Charles Lawson returned call and agrees to CXR this week.

## 2024-05-02 ENCOUNTER — Ambulatory Visit: Payer: Self-pay | Admitting: Oncology

## 2024-05-02 ENCOUNTER — Other Ambulatory Visit: Payer: Self-pay

## 2024-05-02 LAB — CANCER ANTIGEN 19-9: CA 19-9: 14 U/mL (ref 0–35)

## 2024-05-03 ENCOUNTER — Encounter: Payer: Self-pay | Admitting: Oncology

## 2024-05-03 NOTE — Telephone Encounter (Signed)
 Patient gave verbal understanding and had no further questions or concerns

## 2024-05-03 NOTE — Telephone Encounter (Signed)
-----   Message from Arley Hof sent at 05/02/2024  4:18 PM EDT ----- Please call patient, the CA 19-9 is normal, proceed with Port-A-Cath removal, follow-up as scheduled  ----- Message ----- From: Rebecka, Lab In [place] Sent: 05/01/2024   9:04 AM EDT To: Arley KATHEE Hof, MD

## 2024-05-04 ENCOUNTER — Ambulatory Visit: Payer: Self-pay | Admitting: Oncology

## 2024-05-05 ENCOUNTER — Encounter: Payer: Self-pay | Admitting: Oncology

## 2024-05-05 NOTE — Telephone Encounter (Signed)
 Patient gave verbal understanding and had no further questions or concerns

## 2024-05-05 NOTE — Telephone Encounter (Signed)
-----   Message from Arley Hof sent at 05/04/2024  7:54 PM EDT ----- Please call patient, rt. Pleural effusion is improved, f/u as scheduled  ----- Message ----- From: Interface, Rad Results In Sent: 05/04/2024  11:57 AM EDT To: Arley KATHEE Hof, MD

## 2024-05-08 ENCOUNTER — Ambulatory Visit
Admission: RE | Admit: 2024-05-08 | Discharge: 2024-05-08 | Disposition: A | Discharge status: Home / Self Care | Source: Ambulatory Visit | Attending: Oncology | Admitting: Oncology

## 2024-05-08 DIAGNOSIS — C241 Malignant neoplasm of ampulla of Vater: Secondary | ICD-10-CM | POA: Diagnosis not present

## 2024-05-08 DIAGNOSIS — Z452 Encounter for adjustment and management of vascular access device: Secondary | ICD-10-CM | POA: Diagnosis not present

## 2024-05-08 HISTORY — PX: IR REMOVAL TUN ACCESS W/ PORT W/O FL MOD SED: IMG2290

## 2024-05-08 MED ORDER — LIDOCAINE-EPINEPHRINE 1 %-1:100000 IJ SOLN
10.0000 mL | Freq: Once | INTRAMUSCULAR | Status: AC
  Administered 2024-05-08: 10 mL via INTRADERMAL

## 2024-05-08 NOTE — Procedures (Signed)
 Vascular and Interventional Radiology Procedure Note  Patient: Charles Lawson DOB: 04-Feb-1941 Medical Record Number: [id number] Note Date/Time: 05/08/24 12:59 PM   Performing Physician: Thom Hall, MD Assistant(s): None  Diagnosis: Ampullary CA  Procedure: PORT REMOVAL  Anesthesia: Local Anesthetic Complications: None Estimated Blood Loss: Minimal Specimens:  None  Findings:  Successful removal of a right-sided venous port. Primary incision closure. Dermabond at skin.  See detailed procedure note with images in PACS. The patient tolerated the procedure well without incident or complication and was returned to Recovery in stable condition.    Thom Hall, MD [facility] Rad[facility]   Pager. [phone] Clinic. [phone]

## 2024-05-09 ENCOUNTER — Ambulatory Visit
Admission: EM | Admit: 2024-05-09 | Discharge: 2024-05-09 | Disposition: A | Discharge status: Home / Self Care | Attending: Family Medicine | Admitting: Family Medicine

## 2024-05-09 ENCOUNTER — Other Ambulatory Visit: Payer: Self-pay

## 2024-05-09 DIAGNOSIS — T22111A Burn of first degree of right forearm, initial encounter: Secondary | ICD-10-CM | POA: Diagnosis not present

## 2024-05-09 MED ORDER — SILVER SULFADIAZINE 1 % EX CREA
TOPICAL_CREAM | Freq: Once | CUTANEOUS | Status: AC
  Administered 2024-05-09: 1 via TOPICAL

## 2024-05-09 MED ORDER — SILVER SULFADIAZINE 1 % EX CREA: 1.0000 | TOPICAL_CREAM | Freq: Every day | CUTANEOUS | 0 refills | Status: DC

## 2024-05-09 MED ORDER — DOXYCYCLINE HYCLATE 100 MG PO CAPS: 100.0000 mg | ORAL_CAPSULE | Freq: Two times a day (BID) | ORAL | 0 refills | Status: DC

## 2024-05-09 NOTE — Discharge Instructions (Addendum)
 Keep the area clean and dry.  Apply  Silvadene  topical burn/antibiotic cream daily.  Also start doxycycline  twice daily for 10 days.  Monitor symptoms and if you are noticing worsening redness swelling or if you develop any drainage fevers or chills please seek reevaluation in the emergency room.  Otherwise please follow-up with your PCP in 2 to 3 days for recheck.  Hope you feel better soon!

## 2024-05-09 NOTE — ED Provider Notes (Signed)
 [facility]    CSN: [id number] Arrival date & time: 05/09/24  9083      History   Chief Complaint No chief complaint on file.   HPI Charles Lawson is a 83 y.o. male with a past medical history of BPH, hyperlipidemia, basal cell carcinoma presents for burn.  Patient reports 4 to 5 days ago he excellently burned his mid right forearm on a lawnmower muffler.  States he has been putting aloe on it and Neosporin as well as started using peroxide.  He states the burn did not blister but a couple days after he did hit it on something causing the skin to be pulled back.  He does endorse some mild erythema surrounding that they noticed yesterday.  No drainage, fevers or chills.  No history of MRSA.  He is up-to-date on his tetanus.  No other concerns at this time.  HPI  Past Medical History:  Diagnosis Date   Allergic rhinitis, 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   Arthritis    Blood pressure elevated without history of HTN    BPH (benign prostatic hyperplasia)    Diverticulosis of colon    ED (erectile dysfunction)    Helicobacter pylori gastritis 04/2023   Tx quad tx and eradication documented neg stool ag 07/2023   History of basal cell carcinoma (BCC) excision    Hyperlipidemia    Impaired glucose tolerance 04/03/2013   Personal history of colonic polyps 06/07/2012    Patient Active Problem List   Diagnosis Date Noted   H/O Whipple procedure 11/19/2023   Malignant neoplasm of pancreas (HCC) 11/18/2023   Biliary obstruction 11/18/2023   Leukocytosis 11/18/2023   Abnormal CT of the abdomen 11/18/2023   Cholangitis 11/17/2023   Depression 07/01/2023   Coronary artery calcification seen on CT scan 07/01/2023   Aortic atherosclerosis (HCC) 07/01/2023   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   Malnutrition of moderate degree 05/14/2023   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   Gastric outlet obstruction 05/10/2023   Duodenitis 05/08/2023   Thrombocytopenia (HCC) 05/08/2023    Epigastric abdominal pain 04/30/2023   Asthmatic bronchitis 10/09/2021   Impacted ear wax 10/09/2021   Vitamin D  deficiency 06/23/2021   Sinus infection 07/29/2020   Hammertoe of left foot    Tailor's bunion of left foot    Deformity of metatarsal bone of left foot    Injury of plantar plate of left foot    Hallux interphalangeus, acquired, left    Blood pressure elevated without history of HTN 04/02/2020   Ingrown nail 06/16/2019   Acute conjunctivitis of right eye 12/13/2018   Acute pharyngitis 12/13/2018   Dizziness 06/14/2018   Left inguinal hernia 06/11/2017   Mass of left side of neck 06/11/2017   Rash and nonspecific skin eruption 05/25/2014   Impaired glucose tolerance 04/03/2013   Hives 03/10/2013   History of colonic polyps 06/07/2012   Fatigue 03/30/2012   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   Allergic rhinitis 12/29/2011   Arthritis    Hyperlipidemia 01/30/2010   BENIGN PROSTATIC HYPERTROPHY, MILD, HX OF 01/30/2010   Erectile dysfunction 01/26/2008    Past Surgical History:  Procedure Laterality Date   KATRINA OSTEOTOMY Left 07/19/2020   Procedure: KATRINA OSTEOTOMY;  Surgeon: Silva Juliene SAUNDERS, DPM;  Location: [facility] ;  Service: Podiatry;  Laterality: Left;   BIOPSY  05/10/2023   Procedure: BIOPSY;  Surgeon: Avram Lupita BRAVO, MD;  Location: [facility];  Service: Gastroenterology;;   BIOPSY  05/12/2023  Procedure: BIOPSY;  Surgeon: Avram Lupita BRAVO, MD;  Location: [facility];  Service: Gastroenterology;;   ROMAYNE Left 07/19/2020   Procedure: OLINDA ROMAYNE;  Surgeon: Silva Juliene SAUNDERS, DPM;  Location: [facility];  Service: Podiatry;  Laterality: Left;   COLONOSCOPY  last one 10-06-2017  dr avram   ESOPHAGOGASTRODUODENOSCOPY N/A 05/12/2023   Procedure: ESOPHAGOGASTRODUODENOSCOPY (EGD);  Surgeon: Avram Lupita BRAVO, MD;  Location: [facility];  Service: Gastroenterology;  Laterality: N/A;   ESOPHAGOGASTRODUODENOSCOPY  (EGD) WITH PROPOFOL  N/A 05/10/2023   Procedure: ESOPHAGOGASTRODUODENOSCOPY (EGD) WITH PROPOFOL ;  Surgeon: Avram Lupita BRAVO, MD;  Location: [facility];  Service: Gastroenterology;  Laterality: N/A;   HAMMER TOE SURGERY Left 07/19/2020   Procedure: HAMMER TOE CORRECTION;  Surgeon: Silva Juliene SAUNDERS, DPM;  Location: [facility] [place];  Service: Podiatry;  Laterality: Left;   INGUINAL HERNIA REPAIR Right 11-21-2008  @ [facility]   IR BILIARY STENT(S) EXIST ACCESS INC DILAT CATH EXCH/REMOVAL  03/14/2024   IR CHOLANGIOGRAM EXISTING TUBE  01/03/2024   IR EXCHANGE BILIARY DRAIN  12/30/2023   IR EXCHANGE BILIARY DRAIN  02/08/2024   IR IMAGING GUIDED PORT INSERTION  06/18/2023   IR INT EXT BILIARY DRAIN WITH CHOLANGIOGRAM  11/19/2023   IR INT EXT BILIARY DRAIN WITH CHOLANGIOGRAM  12/02/2023   IR REMOVAL TUN ACCESS W/ PORT W/O FL MOD SED  05/08/2024   LUMBAR LAMINECTOMY  1974   METATARSAL OSTEOTOMY Left 07/19/2020   Procedure: METATARSAL OSTEOTOMY;  Surgeon: Silva Juliene SAUNDERS, DPM;  Location: [facility] [place];  Service: Podiatry;  Laterality: Left;   ORIF TOE FRACTURE Left 07/19/2020   Procedure: OPEN TREATMENT OF METARSAL PHALANGEAL JOINT DISLOCATION;  Surgeon: Silva Juliene SAUNDERS, DPM;  Location: [facility];  Service: Podiatry;  Laterality: Left;  ANKLE BLOCK WITH THIGH TOURNIQUET       Home Medications    Prior to Admission medications   Medication Sig Start Date End Date Taking? Authorizing Provider  doxycycline  (VIBRAMYCIN ) 100 MG capsule Take 1 capsule (100 mg total) by mouth 2 (two) times daily. 05/09/24  Yes Kharma Sampsel, Jodi R, NP  silver  sulfADIAZINE  (SILVADENE ) 1 % cream Apply 1 Application topically daily. 05/09/24  Yes Candence Sease, Jodi R, NP  acetaminophen  (TYLENOL ) 650 MG CR tablet Take 500 mg by mouth every 8 (eight) hours as needed for pain. 05/27/23: reports during [facility] call taking after recent surgery at [facility] Patient not taking: Reported on 05/01/2024    Norleen Lynwood ORN, MD   Cyanocobalamin  (VITAMIN B-12 PO) Take 1 tablet by mouth at bedtime.    [provider]  lidocaine -prilocaine  (EMLA ) cream Apply 1 Application topically as needed. 11/15/23   Thomas, Lisa K, NP  lipase/protease/amylase (CREON ) 12000-38000 units CPEP capsule Take 1 capsule (12,000 Units total) by mouth 3 (three) times daily before meals. May take 1 additional capsule daily with a large snack. 03/27/24   Cloretta Arley NOVAK, MD  omeprazole  (PRILOSEC ) 20 MG capsule Take 1 capsule (20 mg total) by mouth daily. 08/16/23   Norleen Lynwood ORN, MD  potassium chloride  (KLOR-CON  M) 10 MEQ tablet TAKE 1 TABLET BY MOUTH 2 TIMES DAILY. Patient taking differently: Take 10 mEq by mouth daily. 02/15/24   Cloretta Arley NOVAK, MD  tamsulosin  (FLOMAX ) 0.4 MG CAPS capsule Take 1 capsule (0.4 mg total) by mouth daily. 05/27/23: reports during [facility] call taking after recent surgery at [facility] 11/22/23   Norleen Lynwood ORN, MD    Family History Family History  Problem Relation Age of Onset  COPD Mother    Cancer Father    Lung cancer Father    Colon cancer Neg Hx    Colon polyps Neg Hx    Rectal cancer Neg Hx    Stomach cancer Neg Hx     Social History Social History   Tobacco Use   Smoking status: Never   Smokeless tobacco: Never  Vaping Use   Vaping status: Never Used  Substance Use Topics   Alcohol use: Yes    Comment: occaional   Drug use: Never     Allergies   Penicillins   Review of Systems Review of Systems  Skin:        Burn right forearm     Physical Exam Triage Vital Signs ED Triage Vitals  Encounter Vitals Group     BP 05/09/24 0925 114/76     Girls Systolic BP Percentile --      Girls Diastolic BP Percentile --      Boys Systolic BP Percentile --      Boys Diastolic BP Percentile --      Pulse Rate 05/09/24 0925 73     Resp 05/09/24 0925 16     Temp 05/09/24 0925 97.6 F (36.4 C)     Temp Source 05/09/24 0925 Oral     SpO2 05/09/24 0925 96 %     Weight --      Height --      Head  Circumference --      Peak Flow --      Pain Score 05/09/24 0924 0     Pain Loc --      Pain Education --      Exclude from Growth Chart --    No data found.  Updated Vital Signs BP 114/76   Pulse 73   Temp 97.6 F (36.4 C) (Oral)   Resp 16   SpO2 96%   Visual Acuity Right Eye Distance:   Left Eye Distance:   Bilateral Distance:    Right Eye Near:   Left Eye Near:    Bilateral Near:     Physical Exam Vitals and nursing note reviewed.  Constitutional:      General: He is not in acute distress.    Appearance: Normal appearance. He is not ill-appearing.  HENT:     Head: Normocephalic and atraumatic.  Eyes:     Pupils: Pupils are equal, round, and reactive to light.  Cardiovascular:     Rate and Rhythm: Normal rate.  Pulmonary:     Effort: Pulmonary effort is normal.  Skin:    General: Skin is warm and dry.         Comments: First-degree burn to mid right forearm measuring 5 cm x 2 and half centimeter.  Mild erythema/swelling extending and surrounding burn without warmth.  No active drainage.  Area is mildly tender to palpation.  See photo.  Neurological:     General: No focal deficit present.     Mental Status: He is alert and oriented to person, place, and time.  Psychiatric:        Mood and Affect: Mood normal.        Behavior: Behavior normal.      UC Treatments / Results  Labs (all labs ordered are listed, but only abnormal results are displayed) Labs Reviewed - No data to display CMP [facility] only) Order: [id number]  Status: Final result     Next appt: 06/30/2024 at 10:00 AM in Internal Medicine (  Lynwood Rush, MD)     Dx: Ampullary carcinoma [facility])   Test Result Released: Yes (not seen)   0 Result Notes     View Follow-Up Encounter          Component Ref Range & Units (hover) 8 d ago (05/01/24) 1 mo ago (03/14/24) 2 mo ago (03/01/24) 3 mo ago (02/08/24) 4 mo ago (01/06/24) 4 mo ago (12/30/23) 4 mo ago (12/27/23)  Sodium 138 141 139 138 135 137  138  Potassium 4.4 4.0 4.5 3.6 4.3 4.0 4.3  Chloride 104 106 105 105 100 106 105  CO2 25 25 24 24 28 24 28   Glucose, Bld 96 109 High  CM 119 High  CM 106 High  CM 110 High  CM 103 High  CM 112 High  CM  Comment: Glucose reference range applies only to samples taken after fasting for at least 8 hours.  BUN 15 16 11 12 13 14 12   Creatinine 0.89 0.75 0.85 0.75 0.63 0.77 0.69  Calcium  9.5 9.1 9.3 8.9 9.4 9.1 9.0  Total Protein 6.7 6.9 6.8 6.8 7.0 6.6 6.5  Albumin 3.9 3.6 3.9 3.2 Low  3.3 Low  3.3 Low  3.6  AST 28 46 High  38 44 High  31 40 37  ALT 25 50 High  33 36 27 29 26   Alkaline Phosphatase 103 177 High  151 High  152 High  219 High  165 High  184 High   Total Bilirubin 0.4 1.1 0.6 1.1 1.1 1.9 High  1.7 High   GFR, Estimated >60  >60 CM  >60 CM  >60 CM  Comment: (NOTE) Calculated using the CKD-EPI Creatinine Equation (2021)  Anion gap 9 10 CM 10 CM 9 CM 7 CM 7 CM 5 CM  Comment: Performed at [facility], [address] [place], [place] [address]  Resulting Agency [facility] CLIN LAB CH CLIN LAB CH CLIN LAB CH CLIN LAB CH CLIN LAB CH CLIN LAB [facility] CLIN LAB        Specimen Collected: 05/01/24 08:09 Last Resulted: 05/01/24 09:04   EKG   Radiology IR REMOVAL TUN ACCESS W/ PORT W/O FL MOD SED Result Date: 05/08/2024 CLINICAL DATA:  port retrival. History of ampullary carcinoma, s/p port placement 05/29/2023. Treatment completion. EXAM: REMOVAL OF IMPLANTED TUNNELED PORT-A-CATH MEDICATIONS: None. ANESTHESIA/SEDATION: Local anesthetic was administered. FLUOROSCOPY TIME:  None PROCEDURE: Informed written consent was obtained from the patient after a discussion of the risk, benefits and alternatives to the procedure. The patient was positioned supine on the fluoroscopy table and the RIGHT chest Port-A-Cath site was prepped with chlorhexidine . A sterile gown and gloves were worn during the procedure. Local anesthesia was provided with 1% lidocaine  with epinephrine . A timeout was  performed prior to the initiation of the procedure. An incision was made overlying the Port-A-Cath with a scalpel. Utilizing sharp and blunt dissection, the Port-A-Cath was removed completely. The pocked was irrigated with sterile saline. Wound closure was performed with interrupted subcutaneous 2-0 Vicryl sutures, then Dermabond was applied on the skin. Dressings were applied. The patient tolerated the procedure well without immediate post procedural complication. FINDINGS: Removal of implant Port-A-Cath without immediate post procedural complication. IMPRESSION: Successful removal of a RIGHT chest implanted Port-A-Cath. Thom Hall, MD [facility] Radiology Electronically Signed   By: Thom Hall M.D.   On: 05/08/2024 14:10    Procedures Procedures (including critical care time)  Medications Ordered in UC Medications  silver   sulfADIAZINE  (SILVADENE ) 1 % cream (has no administration in time range)    Initial Impression / Assessment and Plan / UC Course  I have reviewed the triage vital signs and the nursing notes.  Pertinent labs & imaging results that were available during my care of the patient were reviewed by me and considered in my medical decision making (see chart for details).     Reviewed exam and symptoms with patient.  No red flags.  Wound was cleansed by nursing staff with Silvadene  and nonadherent dressing applied.  Can use Silvadene  daily.  Wound care reviewed with patient.  Will also start doxycycline  given surrounding erythema.  He is up-to-date on his tetanus.  Advised PCP follow-up in 2 to 3 days for recheck.  Strict ER precautions reviewed and patient verbalized understanding. Final Clinical Impressions(s) / UC Diagnoses   Final diagnoses:  Superficial burn of right forearm, initial encounter     Discharge Instructions      Keep the area clean and dry.  Apply  Silvadene  topical burn/antibiotic cream daily.  Also start  doxycycline  twice daily for 10 days.  Monitor symptoms and if you are noticing worsening redness swelling or if you develop any drainage fevers or chills please seek reevaluation in the emergency room.  Otherwise please follow-up with your PCP in 2 to 3 days for recheck.  Hope you feel better soon!     ED Prescriptions     Medication Sig Dispense Auth. Provider   doxycycline  (VIBRAMYCIN ) 100 MG capsule Take 1 capsule (100 mg total) by mouth 2 (two) times daily. 20 capsule Dianna Deshler, Jodi R, NP   silver  sulfADIAZINE  (SILVADENE ) 1 % cream Apply 1 Application topically daily. 20 g Geronimo Diliberto, Jodi R, NP      PDMP not reviewed this encounter.   Loreda Myla SAUNDERS, NP 05/09/24 [phone]

## 2024-05-09 NOTE — ED Triage Notes (Addendum)
 Pt burned his right forearm on the muffler of the lawnmower 5 days ago. Pt states he put aloe on it. Pt also used neosporin and peroxide on it. Pt has a 1st degree burn on right forearm. The burn measures 5cmx3cm. The area has 2+ swelling and the skin surrounding the burn is erythematous.

## 2024-05-12 ENCOUNTER — Other Ambulatory Visit: Payer: Self-pay

## 2024-05-12 ENCOUNTER — Ambulatory Visit: Payer: Self-pay

## 2024-05-12 ENCOUNTER — Ambulatory Visit
Admission: EM | Admit: 2024-05-12 | Discharge: 2024-05-12 | Disposition: A | Discharge status: Home / Self Care | Attending: Family Medicine | Admitting: Family Medicine

## 2024-05-12 DIAGNOSIS — T22111D Burn of first degree of right forearm, subsequent encounter: Secondary | ICD-10-CM

## 2024-05-12 NOTE — ED Provider Notes (Signed)
 [facility]    CSN: [id number] Arrival date & time: 05/12/24  1244      History   Chief Complaint No chief complaint on file.   HPI Charles Lawson is a 83 y.o. male presents for burn check.  Patient was seen in urgent care on August 19 for superficial burn to the right forearm secondary to a lawnmower muffler.  He was started on Silvadene  as well as oral doxycycline .  They state the area of redness that was initially around the wound has improved.  Wife has been cleaning it with peroxide daily and states after that it seems to drain a brown substance but other than that no additional drainage, swelling.  No fevers chills or pain to the site.  No other concerns at this time.  HPI  Past Medical History:  Diagnosis Date   Allergic rhinitis, 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   Arthritis    Blood pressure elevated without history of HTN    BPH (benign prostatic hyperplasia)    Diverticulosis of colon    ED (erectile dysfunction)    Helicobacter pylori gastritis 04/2023   Tx quad tx and eradication documented neg stool ag 07/2023   History of basal cell carcinoma (BCC) excision    Hyperlipidemia    Impaired glucose tolerance 04/03/2013   Personal history of colonic polyps 06/07/2012    Patient Active Problem List   Diagnosis Date Noted   H/O Whipple procedure 11/19/2023   Malignant neoplasm of pancreas (HCC) 11/18/2023   Biliary obstruction 11/18/2023   Leukocytosis 11/18/2023   Abnormal CT of the abdomen 11/18/2023   Cholangitis 11/17/2023   Depression 07/01/2023   Coronary artery calcification seen on CT scan 07/01/2023   Aortic atherosclerosis (HCC) 07/01/2023   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   Malnutrition of moderate degree 05/14/2023   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   Gastric outlet obstruction 05/10/2023   Duodenitis 05/08/2023   Thrombocytopenia (HCC) 05/08/2023   Epigastric abdominal pain 04/30/2023   Asthmatic bronchitis 10/09/2021    Impacted ear wax 10/09/2021   Vitamin D  deficiency 06/23/2021   Sinus infection 07/29/2020   Hammertoe of left foot    Tailor's bunion of left foot    Deformity of metatarsal bone of left foot    Injury of plantar plate of left foot    Hallux interphalangeus, acquired, left    Blood pressure elevated without history of HTN 04/02/2020   Ingrown nail 06/16/2019   Acute conjunctivitis of right eye 12/13/2018   Acute pharyngitis 12/13/2018   Dizziness 06/14/2018   Left inguinal hernia 06/11/2017   Mass of left side of neck 06/11/2017   Rash and nonspecific skin eruption 05/25/2014   Impaired glucose tolerance 04/03/2013   Hives 03/10/2013   History of colonic polyps 06/07/2012   Fatigue 03/30/2012   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   Allergic rhinitis 12/29/2011   Arthritis    Hyperlipidemia 01/30/2010   BENIGN PROSTATIC HYPERTROPHY, MILD, HX OF 01/30/2010   Erectile dysfunction 01/26/2008    Past Surgical History:  Procedure Laterality Date   KATRINA OSTEOTOMY Left 07/19/2020   Procedure: KATRINA OSTEOTOMY;  Surgeon: Silva Juliene SAUNDERS, DPM;  Location[facility];  Service: Podiatry;  Laterality: Left;   BIOPSY  05/10/2023   Procedure: BIOPSY;  Surgeon: Avram Lupita BRAVO, MD;  Location: [facility];  Service: Gastroenterology;;   BIOPSY  05/12/2023   Procedure: BIOPSY;  Surgeon: Avram Lupita BRAVO, MD;  Location: [facility];  Service: Gastroenterology;;  BUNIONECTOMY Left 07/19/2020   Procedure: OLINDA EDWARDS;  Surgeon: Silva Juliene SAUNDERS, DPM;  Location: [facility];  Service: Podiatry;  Laterality: Left;   COLONOSCOPY  last one 10-06-2017  dr avram   ESOPHAGOGASTRODUODENOSCOPY N/A 05/12/2023   Procedure: ESOPHAGOGASTRODUODENOSCOPY (EGD);  Surgeon: avram Lupita BRAVO, MD;  Location: [facility];  Service: Gastroenterology;  Laterality: N/A;   ESOPHAGOGASTRODUODENOSCOPY (EGD) WITH PROPOFOL  N/A 05/10/2023   Procedure: ESOPHAGOGASTRODUODENOSCOPY (EGD)  WITH PROPOFOL ;  Surgeon: avram Lupita BRAVO, MD;  Location: [facility];  Service: Gastroenterology;  Laterality: N/A;   HAMMER TOE SURGERY Left 07/19/2020   Procedure: HAMMER TOE CORRECTION;  Surgeon: Silva Juliene SAUNDERS, DPM;  Location: [facility];  Service: Podiatry;  Laterality: Left;   INGUINAL HERNIA REPAIR Right 11-21-2008  @ [facility]   IR BILIARY STENT(S) EXIST ACCESS INC DILAT CATH EXCH/REMOVAL  03/14/2024   IR CHOLANGIOGRAM EXISTING TUBE  01/03/2024   IR EXCHANGE BILIARY DRAIN  12/30/2023   IR EXCHANGE BILIARY DRAIN  02/08/2024   IR IMAGING GUIDED PORT INSERTION  06/18/2023   IR INT EXT BILIARY DRAIN WITH CHOLANGIOGRAM  11/19/2023   IR INT EXT BILIARY DRAIN WITH CHOLANGIOGRAM  12/02/2023   IR REMOVAL TUN ACCESS W/ PORT W/O FL MOD SED  05/08/2024   LUMBAR LAMINECTOMY  1974   METATARSAL OSTEOTOMY Left 07/19/2020   Procedure: METATARSAL OSTEOTOMY;  Surgeon: Silva Juliene SAUNDERS, DPM;  Location: [facility];  Service: Podiatry;  Laterality: Left;   ORIF TOE FRACTURE Left 07/19/2020   Procedure: OPEN TREATMENT OF METARSAL PHALANGEAL JOINT DISLOCATION;  Surgeon: Silva Juliene SAUNDERS, DPM;  Location: [facility];  Service: Podiatry;  Laterality: Left;  ANKLE BLOCK WITH THIGH TOURNIQUET       Home Medications    Prior to Admission medications   Medication Sig Start Date End Date Taking? Authorizing Provider  acetaminophen  (TYLENOL ) 650 MG CR tablet Take 500 mg by mouth every 8 (eight) hours as needed for pain. 05/27/23: reports during [facility] call taking after recent surgery at [facility] Patient not taking: Reported on 05/01/2024    Norleen Lynwood ORN, MD  Cyanocobalamin  (VITAMIN B-12 PO) Take 1 tablet by mouth at bedtime.    [provider]  doxycycline  (VIBRAMYCIN ) 100 MG capsule Take 1 capsule (100 mg total) by mouth 2 (two) times daily. 05/09/24   Alaia Lordi, Jodi R, NP  lidocaine -prilocaine  (EMLA ) cream Apply 1 Application topically as needed. 11/15/23   Debby Olam POUR,  NP  lipase/protease/amylase (CREON ) 12000-38000 units CPEP capsule Take 1 capsule (12,000 Units total) by mouth 3 (three) times daily before meals. May take 1 additional capsule daily with a large snack. 03/27/24   Cloretta Arley NOVAK, MD  omeprazole  (PRILOSEC ) 20 MG capsule Take 1 capsule (20 mg total) by mouth daily. 08/16/23   Norleen Lynwood ORN, MD  potassium chloride  (KLOR-CON  M) 10 MEQ tablet TAKE 1 TABLET BY MOUTH 2 TIMES DAILY. Patient taking differently: Take 10 mEq by mouth daily. 02/15/24   Cloretta Arley NOVAK, MD  silver  sulfADIAZINE  (SILVADENE ) 1 % cream Apply 1 Application topically daily. 05/09/24   Harel Repetto, Jodi R, NP  tamsulosin  (FLOMAX ) 0.4 MG CAPS capsule Take 1 capsule (0.4 mg total) by mouth daily. 05/27/23: reports during [facility] call taking after recent surgery at [facility] 11/22/23   Norleen Lynwood ORN, MD    Family History Family History  Problem Relation Age of Onset   COPD Mother    Cancer Father    Lung cancer Father  Colon cancer Neg Hx    Colon polyps Neg Hx    Rectal cancer Neg Hx    Stomach cancer Neg Hx     Social History Social History   Tobacco Use   Smoking status: Never   Smokeless tobacco: Never  Vaping Use   Vaping status: Never Used  Substance Use Topics   Alcohol use: Yes    Comment: occaional   Drug use: Never     Allergies   Penicillins   Review of Systems Review of Systems  Skin:  Positive for wound.     Physical Exam Triage Vital Signs ED Triage Vitals  Encounter Vitals Group     BP 05/12/24 1253 131/75     Girls Systolic BP Percentile --      Girls Diastolic BP Percentile --      Boys Systolic BP Percentile --      Boys Diastolic BP Percentile --      Pulse Rate 05/12/24 1253 82     Resp 05/12/24 1253 16     Temp 05/12/24 1253 98 F (36.7 C)     Temp Source 05/12/24 1253 Oral     SpO2 05/12/24 1253 96 %     Weight --      Height --      Head Circumference --      Peak Flow --      Pain Score 05/12/24 1252 0     Pain Loc --      Pain  Education --      Exclude from Growth Chart --    No data found.  Updated Vital Signs BP 131/75   Pulse 82   Temp 98 F (36.7 C) (Oral)   Resp 16   SpO2 96%   Visual Acuity Right Eye Distance:   Left Eye Distance:   Bilateral Distance:    Right Eye Near:   Left Eye Near:    Bilateral Near:     Physical Exam Vitals and nursing note reviewed.  Constitutional:      General: He is not in acute distress.    Appearance: Normal appearance. He is not ill-appearing.  HENT:     Head: Normocephalic and atraumatic.  Eyes:     Pupils: Pupils are equal, round, and reactive to light.  Cardiovascular:     Rate and Rhythm: Normal rate.  Pulmonary:     Effort: Pulmonary effort is normal.  Skin:    General: Skin is warm and dry.         Comments: 5 x 2 cm healing first-degree burn to the right forearm.  There is no surrounding erythema warmth or swelling.  No active drainage.  Area is nontender to palpation.  Neurological:     General: No focal deficit present.     Mental Status: He is alert and oriented to person, place, and time.  Psychiatric:        Mood and Affect: Mood normal.        Behavior: Behavior normal.      UC Treatments / Results  Labs (all labs ordered are listed, but only abnormal results are displayed) Labs Reviewed - No data to display  EKG   Radiology No results found.  Procedures Procedures (including critical care time)  Medications Ordered in UC Medications - No data to display  Initial Impression / Assessment and Plan / UC Course  I have reviewed the triage vital signs and the nursing notes.  Pertinent labs &  imaging results that were available during my care of the patient were reviewed by me and considered in my medical decision making (see chart for details).     Reviewed exam and symptoms with patient and his wife.  Advised to discontinue using peroxide and use only plain soap and water and to continue Silvadene  and the doxycycline  as  previously prescribed.  No indication of wound infection.  Wound care again reviewed and patient to follow-up with PCP in 2 to 3 days for recheck.  ER precautions reviewed. Final Clinical Impressions(s) / UC Diagnoses   Final diagnoses:  Superficial burn of right forearm, subsequent encounter     Discharge Instructions      Clean wound with plain soap and water and avoid using hydrogen peroxide.  Continue the topical Silvadene  antibiotic cream as well as the oral antibiotic doxycycline  as previously prescribed.  Follow-up with your PCP in 2 to 3 days for recheck.  Please go to the ER for any worsening symptoms.  Hope you feel better soon!    ED Prescriptions   None    PDMP not reviewed this encounter.   Loreda Myla SAUNDERS, NP 05/12/24 1320

## 2024-05-12 NOTE — ED Triage Notes (Addendum)
 Pt's wife states pt has been having brown drainage and some blood. Pt's wife was cleaning burn with peroxide dailyx2d. Pt's burn appears to be healing nicely and looks better today than when he originally came in for it

## 2024-05-12 NOTE — Telephone Encounter (Signed)
  Reason for Disposition  [1] Looks infected (e.g., spreading redness, pus) AND [2] no fever  Answer Assessment - Initial Assessment Questions This RN received verbal permission to patient to speak with wife, confirmed last name and DOB. States he was seen at [facility] on 05/09/24 for a thermal burn that occurred on 05/04/24. Per UC, has been applying silvadene . Pt was also using Peroxide. White discharge is present.   This RN advised wife to d/c peroxide. Patient has another appointment elsewhere during the only open time slots. Advised to return to UC today to be re-evaluated. Advised that they may call back later this morning as well to see if any appointments have become available at PCP office. Patient's wife verbalized understanding.  1. ONSET: When did it happen? If happened < 3 hours ago, ask: Did you apply cool water? If not, give First Aid Advice immediately.      05/04/24  2. LOCATION: Where is the burn located?      Right forearm  3. BURN SIZE: How large is the burn?  The palm is roughly 0.5% of the total body surface area (BSA).     3x1  4. SEVERITY OF THE BURN: Are there any blisters? What size are they? (e.g., quarter equals 1 inch or 2.5 cm) Are any of the blisters broken (open or wrinkled)?     Blistering, seeping blood  5. MECHANISM: Tell me how it happened.     Thermal burn from contact with hot lawnmower muffler  6. PAIN: Are you having any pain? How bad is the pain? (Scale 0-10; or none, mild, moderate, severe)     Denies  Protocols used: Geof[employer] GLEN[place] Rim Copied from CRM [id number]. Topic: Clinical - Red Word Triage >> May 12, 2024  9:12 AM Vena HERO wrote: Red Word that prompted transfer to Nurse Triage: went to u/c on 8/19 for burn on rt arm from lawnmower muffler. Received cream for burn but it's still draining

## 2024-05-12 NOTE — Discharge Instructions (Addendum)
 Clean wound with plain soap and water and avoid using hydrogen peroxide.  Continue the topical Silvadene  antibiotic cream as well as the oral antibiotic doxycycline  as previously prescribed.  Follow-up with your PCP in 2 to 3 days for recheck.  Please go to the ER for any worsening symptoms.  Hope you feel better soon!

## 2024-05-31 ENCOUNTER — Other Ambulatory Visit: Payer: Self-pay

## 2024-06-23 ENCOUNTER — Other Ambulatory Visit (HOSPITAL_BASED_OUTPATIENT_CLINIC_OR_DEPARTMENT_OTHER): Payer: Self-pay

## 2024-06-26 ENCOUNTER — Other Ambulatory Visit (HOSPITAL_BASED_OUTPATIENT_CLINIC_OR_DEPARTMENT_OTHER): Payer: Self-pay

## 2024-06-30 ENCOUNTER — Ambulatory Visit: Admitting: Internal Medicine

## 2024-08-01 ENCOUNTER — Other Ambulatory Visit

## 2024-08-01 ENCOUNTER — Other Ambulatory Visit: Admitting: Oncology

## 2024-08-03 ENCOUNTER — Inpatient Hospital Stay (HOSPITAL_BASED_OUTPATIENT_CLINIC_OR_DEPARTMENT_OTHER): Admitting: Oncology

## 2024-08-03 ENCOUNTER — Inpatient Hospital Stay: Attending: Nurse Practitioner

## 2024-08-03 ENCOUNTER — Inpatient Hospital Stay

## 2024-08-03 VITALS — BP 133/72 | HR 77 | Temp 98.1°F | Resp 18 | Ht 71.0 in | Wt 171.9 lb

## 2024-08-03 DIAGNOSIS — C241 Malignant neoplasm of ampulla of Vater: Secondary | ICD-10-CM | POA: Diagnosis not present

## 2024-08-03 DIAGNOSIS — Z85068 Personal history of other malignant neoplasm of small intestine: Secondary | ICD-10-CM | POA: Diagnosis not present

## 2024-08-03 DIAGNOSIS — Z23 Encounter for immunization: Secondary | ICD-10-CM | POA: Diagnosis not present

## 2024-08-03 LAB — CMP (CANCER CENTER ONLY)
ALT: 22 U/L (ref 0–44)
AST: 24 U/L (ref 15–41)
Albumin: 3.9 g/dL (ref 3.5–5.0)
Alkaline Phosphatase: 96 U/L (ref 38–126)
Anion gap: 7 (ref 5–15)
BUN: 14 mg/dL (ref 8–23)
CO2: 27 mmol/L (ref 22–32)
Calcium: 9.9 mg/dL (ref 8.9–10.3)
Chloride: 106 mmol/L (ref 98–111)
Creatinine: 0.97 mg/dL (ref 0.61–1.24)
GFR, Estimated: >60 mL/min (ref >60)
Glucose, Bld: 99 mg/dL (ref 70–99)
Potassium: 4.7 mmol/L (ref 3.5–5.1)
Sodium: 141 mmol/L (ref 135–145)
Total Bilirubin: 0.6 mg/dL (ref 0.0–1.2)
Total Protein: 7 g/dL (ref 6.5–8.1)

## 2024-08-03 MED ORDER — INFLUENZA VAC SPLIT HIGH-DOSE 0.5 ML IM SUSY
0.5000 mL | PREFILLED_SYRINGE | Freq: Once | INTRAMUSCULAR | Status: AC
  Administered 2024-08-03: 0.5 mL via INTRAMUSCULAR
  Filled 2024-08-03: qty 0.5

## 2024-08-03 NOTE — Progress Notes (Signed)
 [facility] OFFICE PROGRESS NOTE   Diagnosis: Ampullary carcinoma  INTERVAL HISTORY:   Charles Lawson returns as scheduled.  He feels well.  Good appetite.  Good energy level.  No complaint.  Objective:  Vital signs in last 24 hours:  Blood pressure 133/72, pulse 77, temperature 98.1 F (36.7 C), temperature source Temporal, resp. rate 18, height 5' 11 (1.803 m), weight 171 lb 14.4 oz (78 kg), SpO2 100%.     Lymphatics: No cervical, supraclavicular, axillary, or inguinal nodes Resp: Lungs clear bilaterally Cardio: Regular rate and rhythm GI: No hepatomegaly, no apparent ascites, no mass Vascular: No leg edema   Lab Results:  Lab Results  Component Value Date   WBC 6.9 03/14/2024   HGB 14.8 03/14/2024   HCT 45.0 03/14/2024   MCV 88.2 03/14/2024   PLT 186 03/14/2024   NEUTROABS 3.7 03/14/2024    CMP  Lab Results  Component Value Date   NA 138 05/01/2024   K 4.4 05/01/2024   CL 104 05/01/2024   CO2 25 05/01/2024   GLUCOSE 96 05/01/2024   BUN 15 05/01/2024   CREATININE 0.89 05/01/2024   CALCIUM  9.5 05/01/2024   PROT 6.7 05/01/2024   ALBUMIN 3.9 05/01/2024   AST 28 05/01/2024   ALT 25 05/01/2024   ALKPHOS 103 05/01/2024   BILITOT 0.4 05/01/2024   GFRNONAA >60 05/01/2024   GFRAA  11/19/2008    >60        The eGFR has been calculated using the MDRD equation. This calculation has not been validated in all clinical situations. eGFR's persistently <60 mL/min signify possible Chronic Kidney Disease.    Lab Results  Component Value Date   RJW800 14 05/01/2024    Lab Results  Component Value Date   INR 1.1 03/14/2024   LABPROT 13.9 03/14/2024    Imaging:  No results found.  Medications: I have reviewed the patient's current medications.   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,pN1), lymphovascular and perineural invasion  present, negative resection margins-closest margins or anterior and posterior peripancreatic soft tissue surfaces at 0.3 cm Elevated CA 19-9 Cycle 1 day 1 gemcitabine /capecitabine  06/23/2023, day 15 gemcitabine  07/06/2023 Cycle 2-day 1 gemcitabine /capecitabine  07/21/2023, day 15 gemcitabine  08/04/2023 Cycle 3-day 1 gemcitabine  08/18/2023 (capecitabine  08/20/2023 dose reduced), day 15 gemcitabine  09/01/2023 Cycle 4-day 1 gemcitabine /capecitabine  09/20/2023, day 15 gemcitabine  10/04/2023 CT abdomen/pelvis 10/09/2023:-No evidence of recurrent pancreatic cancer, mild enhancement of the common hepatic duct leading up to the enteric anastomosis, mild biliary duct dilation in the left hepatic lobe Cycle 5-day 1 gemcitabine /capecitabine  11/01/2023; day 15 Gemcitabine  held 11/15/2023 due to elevated liver enzymes, last capecitabine  given a.m. 11/17/2023 Cycle 6-day 1 gemcitabine /capecitabine  11/29/2023, day 15 Gemcitabine  12/13/2023 Duodenal obstruction August 2024 secondary to #1 H. pylori gastritis 05/10/2023-we will confirm treatment plan with Dr. 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  L2-L3 mild spinal canal stenosis.  L3-L4 mild to moderate left neural foraminal narrowing.  L5-S1 mild bilateral neural foraminal narrowing.  Narrowing of the left lateral recess at L1-L2. Elevated liver enzymes January 2025 MRI/MRCP 10/22/2023: No evidence of metastatic disease, mild intrahepatic biliary dilation, uniform thickening of the common bile duct with enhancement Liver enzymes normal 10/27/2023 7.  Admission 11/17/2023 with obstructive jaundice/cholangitis CT abdomen/pelvis 11/17/2023: Increased Intermatic biliary duct dilation terminating in prominent soft tissue in the porta hepatis MRI abdomen/MRCP 11/18/2023-progressive moderate intrahepatic  and extrahepatic biliary duct dilation tapering at the common duct  anastomosis, persistent spiculated soft tissue at the anastomotic site 11/19/2023-image guided percutaneous cholangiogram with internal/external biliary drain placement Biliary drain exchange 12/02/2023 Biliary drain exchange 12/30/2023 Right chest pain following biliary drain exchange 12/30/2023 IR cholangiogram 01/03/2024: Biliary drain well-positioned and patent CT chest 01/03/2024: Negative for pulmonary embolism, small to moderate amount of loculated right pleural fluid with passive atelectasis at the right lower lobe 03/14/2024: Common bile duct stent, removal of internal/external biliary drain        Disposition: 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  We will follow-up on the CA 19-9 from today.  He will return for an office visit in 4 months.  He received an influenza vaccine today.  Arley Hof, MD  08/03/2024  8:55 AM

## 2024-08-03 NOTE — Addendum Note (Signed)
 Addended by: STEVA DEVERE CROME on: 08/03/2024 09:25 AM   Modules accepted: Orders

## 2024-08-04 ENCOUNTER — Encounter: Payer: Self-pay | Admitting: Internal Medicine

## 2024-08-04 ENCOUNTER — Ambulatory Visit (INDEPENDENT_AMBULATORY_CARE_PROVIDER_SITE_OTHER): Admitting: Internal Medicine

## 2024-08-04 ENCOUNTER — Other Ambulatory Visit: Payer: Self-pay

## 2024-08-04 VITALS — BP 120/72 | HR 64 | Temp 97.7°F | Ht 71.0 in | Wt 174.0 lb

## 2024-08-04 DIAGNOSIS — R7302 Impaired glucose tolerance (oral): Secondary | ICD-10-CM | POA: Diagnosis not present

## 2024-08-04 DIAGNOSIS — E7800 Pure hypercholesterolemia, unspecified: Secondary | ICD-10-CM | POA: Diagnosis not present

## 2024-08-04 DIAGNOSIS — E559 Vitamin D deficiency, unspecified: Secondary | ICD-10-CM | POA: Diagnosis not present

## 2024-08-04 DIAGNOSIS — F32A Depression, unspecified: Secondary | ICD-10-CM

## 2024-08-04 LAB — CANCER ANTIGEN 19-9: CA 19-9: 10 U/mL (ref 0–35)

## 2024-08-04 NOTE — Progress Notes (Signed)
 Patient ID: Charles Lawson, male   DOB: Jul 08, 1941, 83 y.o.   MRN: [id number]         Chief Complaint:: yearly exam       HPI:  Charles Lawson is a 83 y.o. male here overall fatigued and slowed but denies any pain , breathing issues, falls or other new symptoms.  Pt denies chest pain, increased sob or doe, wheezing, orthopnea, PND, increased LE swelling, palpitations, dizziness or syncope.   Pt denies polydipsia, polyuria, or new focal neuro s/s.    Pt denies fever,  recent wt loss, night sweats, loss of appetite, or other constitutional symptoms  CA 19-9 10 yesterday is lowest in the past yr. CMET normal.  Denies worsening depressive symptoms, suicidal ideation, or panic   Wt Readings from Last 3 Encounters:  08/04/24 174 lb (78.9 kg)  08/03/24 171 lb 14.4 oz (78 kg)  05/01/24 171 lb 6.4 oz (77.7 kg)   BP Readings from Last 3 Encounters:  08/04/24 120/72  08/03/24 133/72  05/12/24 131/75   Immunization History  Administered Date(s) Administered   Fluad Quad(high Dose 65+) 06/16/2019, 06/20/2020, 06/23/2021, 06/24/2022   Fluad Trivalent(High Dose 65+) 08/04/2023   INFLUENZA, HIGH DOSE SEASONAL PF 05/28/2015, 06/11/2017, 06/14/2018, 08/03/2024   Influenza,inj,Quad PF,6+ Mos 05/25/2014   PFIZER(Purple Top)SARS-COV-2 Vaccination 10/27/2019, 11/21/2019, 08/26/2020   Pneumococcal Conjugate-13 06/08/2014   Pneumococcal Polysaccharide-23 01/30/2010   Td 12/21/2006   Tdap 06/11/2017   Zoster Recombinant(Shingrix) 07/15/2022, 10/01/2022   Zoster, Live 08/27/2009   Health Maintenance Due  Topic Date Due   Medicare Annual Wellness (AWV)  08/08/2024      Past Medical History:  Diagnosis Date   Allergic rhinitis, 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   Arthritis    Blood pressure elevated without history of HTN    BPH (benign prostatic hyperplasia)    Diverticulosis of colon    ED (erectile dysfunction)    Helicobacter pylori gastritis 04/2023   Tx quad tx and eradication documented  neg stool ag 07/2023   History of basal cell carcinoma (BCC) excision    Hyperlipidemia    Impaired glucose tolerance 04/03/2013   Personal history of colonic polyps 06/07/2012   Past Surgical History:  Procedure Laterality Date   KATRINA OSTEOTOMY Left 07/19/2020   Procedure: KATRINA OSTEOTOMY;  Surgeon: Silva Juliene SAUNDERS, DPM;  Location: [facility] [place];  Service: Podiatry;  Laterality: Left;   BIOPSY  05/10/2023   Procedure: BIOPSY;  Surgeon: Avram Lupita BRAVO, MD;  Location: [facility];  Service: Gastroenterology;;   BIOPSY  05/12/2023   Procedure: BIOPSY;  Surgeon: Avram Lupita BRAVO, MD;  Location: [facility];  Service: Gastroenterology;;   ROMAYNE Left 07/19/2020   Procedure: OLINDA ROMAYNE;  Surgeon: Silva Juliene SAUNDERS, DPM;  Location: [facility];  Service: Podiatry;  Laterality: Left;   COLONOSCOPY  last one 10-06-2017  dr avram   ESOPHAGOGASTRODUODENOSCOPY N/A 05/12/2023   Procedure: ESOPHAGOGASTRODUODENOSCOPY (EGD);  Surgeon: Avram Lupita BRAVO, MD;  Location: [facility];  Service: Gastroenterology;  Laterality: N/A;   ESOPHAGOGASTRODUODENOSCOPY (EGD) WITH PROPOFOL  N/A 05/10/2023   Procedure: ESOPHAGOGASTRODUODENOSCOPY (EGD) WITH PROPOFOL ;  Surgeon: Avram Lupita BRAVO, MD;  Location: [facility];  Service: Gastroenterology;  Laterality: N/A;   HAMMER TOE SURGERY Left 07/19/2020   Procedure: HAMMER TOE CORRECTION;  Surgeon: Silva Juliene SAUNDERS, DPM;  Location: [facility];  Service: Podiatry;  Laterality: Left;   INGUINAL HERNIA REPAIR Right 11-21-2008  @ [facility]   IR BILIARY  STENT(S) EXIST ACCESS INC DILAT CATH EXCH/REMOVAL  03/14/2024   IR CHOLANGIOGRAM EXISTING TUBE  01/03/2024   IR EXCHANGE BILIARY DRAIN  12/30/2023   IR EXCHANGE BILIARY DRAIN  02/08/2024   IR IMAGING GUIDED PORT INSERTION  06/18/2023   IR INT EXT BILIARY DRAIN WITH CHOLANGIOGRAM  11/19/2023   IR INT EXT BILIARY DRAIN WITH CHOLANGIOGRAM  12/02/2023   IR REMOVAL TUN ACCESS W/  PORT W/O FL MOD SED  05/08/2024   LUMBAR LAMINECTOMY  1974   METATARSAL OSTEOTOMY Left 07/19/2020   Procedure: METATARSAL OSTEOTOMY;  Surgeon: Silva Juliene SAUNDERS, DPM;  Location: [facility];  Service: Podiatry;  Laterality: Left;   ORIF TOE FRACTURE Left 07/19/2020   Procedure: OPEN TREATMENT OF METARSAL PHALANGEAL JOINT DISLOCATION;  Surgeon: Silva Juliene SAUNDERS, DPM;  Location: [facility] [place];  Service: Podiatry;  Laterality: Left;  ANKLE BLOCK WITH THIGH TOURNIQUET    reports that he has never smoked. He has never used smokeless tobacco. He reports current alcohol use. He reports that he does not use drugs. family history includes COPD in his mother; Cancer in his father; Lung cancer in his father. Allergies  Allergen Reactions   Penicillins Other (See Comments)    Unknown childhood reaction   Current Outpatient Medications on File Prior to Visit  Medication Sig Dispense Refill   acetaminophen  (TYLENOL ) 650 MG CR tablet Take 500 mg by mouth every 8 (eight) hours as needed for pain. 05/27/23: reports during TOC call taking after recent surgery at [facility]     Cyanocobalamin  (VITAMIN B-12 PO) Take 1 tablet by mouth at bedtime.     lipase/protease/amylase (CREON ) 12000-38000 units CPEP capsule Take 1 capsule (12,000 Units total) by mouth 3 (three) times daily before meals. May take 1 additional capsule daily with a large snack. 300 capsule 1   omeprazole  (PRILOSEC ) 20 MG capsule Take 1 capsule (20 mg total) by mouth daily. 90 capsule 3   potassium chloride  (KLOR-CON  M) 10 MEQ tablet TAKE 1 TABLET BY MOUTH 2 TIMES DAILY. (Patient taking differently: Take 10 mEq by mouth daily.) 180 tablet 0   tamsulosin  (FLOMAX ) 0.4 MG CAPS capsule Take 1 capsule (0.4 mg total) by mouth daily. 05/27/23: reports during TOC call taking after recent surgery at [facility] 90 capsule 3   Current Facility-Administered Medications on File Prior to Visit  Medication Dose Route Frequency Provider Last Rate  Last Admin   sodium chloride  flush (NS) 0.9 % injection 10 mL  10 mL Intravenous PRN Cloretta Arley NOVAK, MD   10 mL at 10/18/23 1310        ROS:  All others reviewed and negative.  Objective        PE:  BP 120/72 (BP Location: Left Arm, Patient Position: Sitting, Cuff Size: Normal)   Pulse 64   Temp 97.7 F (36.5 C) (Oral)   Ht 5' 11 (1.803 m)   Wt 174 lb (78.9 kg)   SpO2 98%   BMI 24.27 kg/m                 Constitutional: Pt appears in NAD               HENT: Head: NCAT.                Right Ear: External ear normal.                 Left Ear: External ear normal.  Eyes: . Pupils are equal, round, and reactive to light. Conjunctivae and EOM are normal               Nose: without d/c or deformity               Neck: Neck supple. Gross normal ROM               Cardiovascular: Normal rate and regular rhythm.                 Pulmonary/Chest: Effort normal and breath sounds without rales or wheezing.                Abd:  Soft, NT, ND, + BS, no organomegaly               Neurological: Pt is alert. At baseline orientation, motor grossly intact               Skin: Skin is warm. No rashes, no other new lesions, LE edema - none               Psychiatric: Pt behavior is normal without agitation   Micro: none  Cardiac tracings I have personally interpreted today:  none  Pertinent Radiological findings (summarize): none   Lab Results  Component Value Date   WBC 6.9 03/14/2024   HGB 14.8 03/14/2024   HCT 45.0 03/14/2024   PLT 186 03/14/2024   GLUCOSE 99 08/03/2024   CHOL 144 06/24/2022   TRIG 80.0 06/24/2022   HDL 43.60 06/24/2022   LDLDIRECT 88.0 04/02/2020   LDLCALC 84 06/24/2022   ALT 22 08/03/2024   AST 24 08/03/2024   NA 141 08/03/2024   K 4.7 08/03/2024   CL 106 08/03/2024   CREATININE 0.97 08/03/2024   BUN 14 08/03/2024   CO2 27 08/03/2024   TSH 1.44 06/24/2022   PSA 4.99 (H) 06/23/2021   INR 1.1 03/14/2024   HGBA1C 5.1 05/14/2023    Assessment/Plan:  Charles Lawson is a 83 y.o. 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  Vitamin D  deficiency Last vitamin D  Lab Results  Component Value Date   VD25OH 34.08 06/24/2022   Low, to start oral replacement   Impaired glucose tolerance Lab Results  Component Value Date   HGBA1C 5.1 05/14/2023   Stable, pt to continue current medical treatment  - diet, wt control   Hyperlipidemia Lab Results  Component Value Date   LDLCALC 84 06/24/2022   Stable, declines further lab today as had labs done yesterday at [facility]   Depression Stable, declines need for any change in tx for now,  to f/u any worsening symptoms or concerns  Followup: Return in about 6 months (around 02/01/2025).  Lynwood Rush, MD 08/04/2024 10:05 AM [facility]

## 2024-08-04 NOTE — Assessment & Plan Note (Signed)
Lab Results  Component Value Date   HGBA1C 5.1 05/14/2023   Stable, pt to continue current medical treatment  - diet, wt control

## 2024-08-04 NOTE — Assessment & Plan Note (Signed)
 Lab Results  Component Value Date   LDLCALC 84 06/24/2022   Stable, declines further lab today as had labs done yesterday at cancer ctr

## 2024-08-04 NOTE — Assessment & Plan Note (Signed)
 Stable, declines need for any change in tx for now,  to f/u any worsening symptoms or concerns

## 2024-08-04 NOTE — Assessment & Plan Note (Signed)
Last vitamin D Lab Results  Component Value Date   VD25OH 34.08 06/24/2022   Low, to start oral replacement

## 2024-08-04 NOTE — Patient Instructions (Signed)
Please continue all other medications as before, and refills have been done if requested.  Please have the pharmacy call with any other refills you may need.  Please continue your efforts at being more active, low cholesterol diet, and weight control.  You are otherwise up to date with prevention measures today.  Please keep your appointments with your specialists as you may have planned  We can hold on further lab testing today  Please make an Appointment to return in 6 months, or sooner if needed 

## 2024-08-06 ENCOUNTER — Other Ambulatory Visit: Payer: Self-pay | Admitting: Internal Medicine

## 2024-08-07 ENCOUNTER — Other Ambulatory Visit: Payer: Self-pay

## 2024-08-10 ENCOUNTER — Ambulatory Visit

## 2024-08-21 DIAGNOSIS — L821 Other seborrheic keratosis: Secondary | ICD-10-CM | POA: Diagnosis not present

## 2024-08-21 DIAGNOSIS — L570 Actinic keratosis: Secondary | ICD-10-CM | POA: Diagnosis not present

## 2024-08-21 DIAGNOSIS — D1801 Hemangioma of skin and subcutaneous tissue: Secondary | ICD-10-CM | POA: Diagnosis not present

## 2024-08-21 DIAGNOSIS — Z08 Encounter for follow-up examination after completed treatment for malignant neoplasm: Secondary | ICD-10-CM | POA: Diagnosis not present

## 2024-08-21 DIAGNOSIS — Z85828 Personal history of other malignant neoplasm of skin: Secondary | ICD-10-CM | POA: Diagnosis not present

## 2024-08-21 DIAGNOSIS — L814 Other melanin hyperpigmentation: Secondary | ICD-10-CM | POA: Diagnosis not present

## 2024-08-21 DIAGNOSIS — D485 Neoplasm of uncertain behavior of skin: Secondary | ICD-10-CM | POA: Diagnosis not present

## 2024-09-26 ENCOUNTER — Ambulatory Visit

## 2024-09-26 VITALS — Ht 70.5 in | Wt 174.0 lb

## 2024-09-26 DIAGNOSIS — Z0000 Encounter for general adult medical examination without abnormal findings: Secondary | ICD-10-CM

## 2024-09-26 NOTE — Patient Instructions (Addendum)
 Charles Lawson,  Thank you for taking the time for your Medicare Wellness Visit. I appreciate your continued commitment to your health goals. Please review the care plan we discussed, and feel free to reach out if I can assist you further.  Please note that Annual Wellness Visits do not include a physical exam. Some assessments may be limited, especially if the visit was conducted virtually. If needed, we may recommend an in-person follow-up with your provider.  Ongoing Care Seeing your primary care provider every 3 to 6 months helps us  monitor your health and provide consistent, personalized care.   Referrals If a referral was made during today's visit and you haven't received any updates within two weeks, please contact the referred provider directly to check on the status.  Recommended Screenings:  Health Maintenance  Topic Date Due   Colon Cancer Screening  08/04/2025*   Medicare Annual Wellness Visit  09/26/2025   DTaP/Tdap/Td vaccine (3 - Td or Tdap) 06/12/2027   Pneumococcal Vaccine for age over 52  Completed   Flu Shot  Completed   Zoster (Shingles) Vaccine  Completed   Meningitis B Vaccine  Aged Out   COVID-19 Vaccine  Discontinued   Hepatitis C Screening  Discontinued  *Topic was postponed. The date shown is not the original due date.       09/26/2024    2:36 PM  Advanced Directives  Does Patient Have a Medical Advance Directive? No  Would patient like information on creating a medical advance directive? No - Patient declined    Vision: Annual vision screenings are recommended for early detection of glaucoma, cataracts, and diabetic retinopathy. These exams can also reveal signs of chronic conditions such as diabetes and high blood pressure.  Dental: Annual dental screenings help detect early signs of oral cancer, gum disease, and other conditions linked to overall health, including heart disease and diabetes.

## 2024-09-26 NOTE — Progress Notes (Addendum)
 "  Chief Complaint  Patient presents with   Medicare Wellness     Subjective:   CELVIN Lawson is a 84 y.o. male who presents for a Medicare Annual Wellness Visit.  Visit info / Clinical Intake: Medicare Wellness Visit Type:: Subsequent Annual Wellness Visit Persons participating in visit and providing information:: patient Medicare Wellness Visit Mode:: Telephone If telephone:: video declined Since this visit was completed virtually, some vitals may be partially provided or unavailable. Missing vitals are due to the limitations of the virtual format.: Documented vitals are patient reported If Telephone or Video please confirm:: I connected with patient using audio/video enable telemedicine. I verified patient identity with two identifiers, discussed telehealth limitations, and patient agreed to proceed. Patient Location:: Home Provider Location:: Office Interpreter Needed?: No Pre-visit prep was completed: yes AWV questionnaire completed by patient prior to visit?: no Living arrangements:: lives with spouse/significant other Patient's Overall Health Status Rating: good Typical amount of pain: none Does pain affect daily life?: no Are you currently prescribed opioids?: no  Dietary Habits and Nutritional Risks How many meals a day?: 3 Eats fruit and vegetables daily?: yes Most meals are obtained by: preparing own meals; having others provide food In the last 2 weeks, have you had any of the following?: none Diabetic:: no  Functional Status Activities of Daily Living (to include ambulation/medication): Independent Ambulation: Independent with device- listed below Home Assistive Devices/Equipment: Eyeglasses Medication Administration: Independent Home Management (perform basic housework or laundry): Independent Manage your own finances?: yes Primary transportation is: driving Concerns about vision?: no *vision screening is required for WTM* Concerns about hearing?: no  Fall  Screening Falls in the past year?: 0 Number of falls in past year: 0 Was there an injury with Fall?: 0 Fall Risk Category Calculator: 0 Patient Fall Risk Level: Low Fall Risk  Fall Risk Patient at Risk for Falls Due to: No Fall Risks Fall risk Follow up: Falls evaluation completed; Falls prevention discussed  Home and Transportation Safety: All rugs have non-skid backing?: N/A, no rugs All stairs or steps have railings?: yes Grab bars in the bathtub or shower?: yes Have non-skid surface in bathtub or shower?: yes Good home lighting?: yes Regular seat belt use?: yes Hospital stays in the last year:: no  Cognitive Assessment Difficulty concentrating, remembering, or making decisions? : no Will 6CIT or Mini Cog be Completed: yes What year is it?: 0 points What month is it?: 0 points Give patient an address phrase to remember (5 components): [address] [place], [place] About what time is it?: 0 points Count backwards from 20 to 1: 0 points Say the months of the year in reverse: 0 points Repeat the address phrase from earlier: 0 points 6 CIT Score: 0 points  Advance Directives (For Healthcare) Does Patient Have a Medical Advance Directive?: No Does patient want to make changes to medical advance directive?: No - Patient declined Type of Advance Directive: Healthcare Power of [place]; Living will Copy of Healthcare Power of Attorney in Chart?: No - copy requested Copy of Living Will in Chart?: No - copy requested Would patient like information on creating a medical advance directive?: No - Patient declined  Reviewed/Updated  Reviewed/Updated: Reviewed All (Medical, Surgical, Family, Medications, Allergies, Care Teams, Patient Goals)    Allergies (verified) Penicillins   Current Medications (verified) Outpatient Encounter Medications as of 09/26/2024  Medication Sig   acetaminophen  (TYLENOL ) 650 MG CR tablet Take 500 mg by mouth every 8 (eight) hours as needed for pain.  05/27/23: reports during TOC call taking after recent surgery at [facility]   Cyanocobalamin  (VITAMIN B-12 PO) Take 1 tablet by mouth at bedtime.   lipase/protease/amylase (CREON ) 12000-38000 units CPEP capsule Take 1 capsule (12,000 Units total) by mouth 3 (three) times daily before meals. May take 1 additional capsule daily with a large snack.   omeprazole  (PRILOSEC ) 20 MG capsule TAKE 1 CAPSULE BY MOUTH EVERY DAY   potassium chloride  (KLOR-CON  M) 10 MEQ tablet TAKE 1 TABLET BY MOUTH 2 TIMES DAILY. (Patient taking differently: Take 10 mEq by mouth daily.)   tamsulosin  (FLOMAX ) 0.4 MG CAPS capsule Take 1 capsule (0.4 mg total) by mouth daily. 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   Arthritis    Blood pressure elevated without history of HTN    BPH (benign prostatic hyperplasia)    Diverticulosis of colon    ED (erectile dysfunction)    Helicobacter pylori gastritis 04/2023   Tx quad tx and eradication documented neg stool ag 07/2023   History of basal cell carcinoma (BCC) excision    Hyperlipidemia    Impaired glucose tolerance 04/03/2013   Personal history of colonic polyps 06/07/2012   Past Surgical History:  Procedure Laterality Date   KATRINA OSTEOTOMY Left 07/19/2020   Procedure: KATRINA OSTEOTOMY;  Surgeon: Silva Juliene SAUNDERS, DPM;  Location: [facility] [place];  Service: Podiatry;  Laterality: Left;   BIOPSY  05/10/2023   Procedure: BIOPSY;  Surgeon: Avram Lupita BRAVO, MD;  Location: [facility];  Service: Gastroenterology;;   BIOPSY  05/12/2023   Procedure: BIOPSY;  Surgeon: Avram Lupita BRAVO, MD;  Location: [facility];  Service: Gastroenterology;;   Lawson Left 07/19/2020   Procedure: OLINDA Lawson;   Surgeon: Silva Juliene SAUNDERS, DPM;  Location: [facility];  Service: Podiatry;  Laterality: Left;   COLONOSCOPY  last one 10-06-2017  dr avram   ESOPHAGOGASTRODUODENOSCOPY N/A 05/12/2023   Procedure: ESOPHAGOGASTRODUODENOSCOPY (EGD);  Surgeon: Avram Lupita BRAVO, MD;  Location: [facility];  Service: Gastroenterology;  Laterality: N/A;   ESOPHAGOGASTRODUODENOSCOPY (EGD) WITH PROPOFOL  N/A 05/10/2023   Procedure: ESOPHAGOGASTRODUODENOSCOPY (EGD) WITH PROPOFOL ;  Surgeon: Avram Lupita BRAVO, MD;  Location: [facility];  Service: Gastroenterology;  Laterality: N/A;   HAMMER TOE SURGERY Left 07/19/2020   Procedure: HAMMER TOE CORRECTION;  Surgeon: Silva Juliene SAUNDERS, DPM;  Location: [facility] [place];  Service: Podiatry;  Laterality: Left;   INGUINAL HERNIA REPAIR Right 11-21-2008  @ [facility]   IR BILIARY STENT(S) EXIST ACCESS INC DILAT CATH EXCH/REMOVAL  03/14/2024   IR CHOLANGIOGRAM EXISTING TUBE  01/03/2024   IR EXCHANGE BILIARY DRAIN  12/30/2023   IR EXCHANGE BILIARY DRAIN  02/08/2024   IR IMAGING GUIDED PORT INSERTION  06/18/2023   IR INT EXT BILIARY DRAIN WITH CHOLANGIOGRAM  11/19/2023   IR INT EXT BILIARY DRAIN WITH CHOLANGIOGRAM  12/02/2023   IR REMOVAL TUN ACCESS W/ PORT W/O FL MOD SED  05/08/2024   LUMBAR LAMINECTOMY  1974   METATARSAL OSTEOTOMY Left 07/19/2020   Procedure: METATARSAL OSTEOTOMY;  Surgeon: Silva Juliene SAUNDERS, DPM;  Location: [facility];  Service: Podiatry;  Laterality: Left;   ORIF TOE FRACTURE Left 07/19/2020   Procedure: OPEN TREATMENT OF METARSAL PHALANGEAL JOINT DISLOCATION;  Surgeon: Silva Juliene SAUNDERS, DPM;  Location: [facility];  Service: Podiatry;  Laterality: Left;  ANKLE BLOCK WITH THIGH TOURNIQUET   Family History  Problem Relation Age of Onset   COPD Mother    Cancer Father    Lung cancer Father    Colon cancer Neg Hx    Colon polyps Neg Hx    Rectal cancer Neg Hx    Stomach cancer Neg Hx    Social History    Occupational History   Occupation: Retired  Tobacco Use   Smoking status: Never   Smokeless tobacco: Never  Vaping Use   Vaping status: Never Used  Substance and Sexual Activity   Alcohol use: Yes    Comment: occaional   Drug use: Never   Sexual activity: Yes   Tobacco Counseling Counseling given: No  SDOH Screenings   Food Insecurity: No Food Insecurity (09/26/2024)  Housing: Unknown (09/26/2024)  Transportation Needs: No Transportation Needs (09/26/2024)  Utilities: Not At Risk (09/26/2024)  Alcohol Screen: Low Risk (08/09/2023)  Depression (PHQ2-9): Low Risk (09/26/2024)  Financial Resource Strain: Low Risk (08/09/2023)  Physical Activity: Inactive (09/26/2024)  Social Connections: Moderately Integrated (09/26/2024)  Stress: No Stress Concern Present (09/26/2024)  Tobacco Use: Low Risk (09/26/2024)  Health Literacy: Adequate Health Literacy (09/26/2024)   See flowsheets for full screening details  Depression Screen PHQ 2 & 9 Depression Scale- Over the past 2 weeks, how often have you been bothered by any of the following problems? Little interest or pleasure in doing things: 0 Feeling down, depressed, or hopeless (PHQ Adolescent also includes...irritable): 0 PHQ-2 Total Score: 0 Trouble falling or staying asleep, or sleeping too much: 0 Feeling tired or having little energy: 0 Poor appetite or overeating (PHQ Adolescent also includes...weight loss): 0 Feeling bad about yourself - or that you are a failure or have let yourself or your family down: 0 Trouble concentrating on things, such as reading the newspaper or watching television (PHQ Adolescent also includes...like school work): 0 Moving or speaking so slowly that other people could have noticed. Or the opposite - being so fidgety or restless that you have been moving around a lot more than usual: 0 Thoughts that you would be better off dead, or of hurting yourself in some way: 0 PHQ-9 Total Score: 0 If you checked off any  problems, how difficult have these problems made it for you to do your work, take care of things at home, or get along with other people?: Not difficult at all  Depression Treatment Depression Interventions/Treatment : [id number] Score <4 Follow-up Not Indicated     Goals Addressed               This Visit's Progress     Patient Stated (pt-stated)        Patient stated he plans to continue being active and watching diet better             Objective:    Today's Vitals   09/26/24 1434  Weight: 174 lb (78.9 kg)  Height: 5' 10.5 (1.791 m)   Body mass index is 24.61 kg/m.  Hearing/Vision screen Hearing Screening - Comments:: Denies hearing difficulties   Vision Screening - Comments:: Wears eyeglasses for reading - up to date with routine eye exams with [facility] Immunizations and Health Maintenance Health Maintenance  Topic Date Due   Colonoscopy  08/04/2025 (Originally 10/06/2022)   Medicare Annual Wellness (AWV)  09/26/2025   DTaP/Tdap/Td (3 - Td or Tdap) 06/12/2027  Pneumococcal Vaccine: 50+ Years  Completed   Influenza Vaccine  Completed   Zoster Vaccines- Shingrix  Completed   Meningococcal B Vaccine  Aged Out   COVID-19 Vaccine  Discontinued   Hepatitis C Screening  Discontinued        Assessment/Plan:  This is a routine wellness examination for Charles Lawson.  Patient Care Team: Norleen Lynwood ORN, MD as PCP - General (Internal Medicine) Cleotilde Elspeth CROME, OD as Consulting Physician (Optometry) Nori, Sari SQUIBB, RN as Oncology Nurse Navigator Cloretta, Arley NOVAK, MD as Consulting Physician (Oncology)  I have personally reviewed and noted the following in the patients chart:   Medical and social history Use of alcohol, tobacco or illicit drugs  Current medications and supplements including opioid prescriptions. Functional ability and status Nutritional status Physical activity Advanced directives List of other physicians Hospitalizations, surgeries, and ER  visits in previous 12 months Vitals Screenings to include cognitive, depression, and falls Referrals and appointments  No orders of the defined types were placed in this encounter.  In addition, I have reviewed and discussed with patient certain preventive protocols, quality metrics, and best practice recommendations. A written personalized care plan for preventive services as well as general preventive health recommendations were provided to patient.   Verdie CHRISTELLA Saba, CMA   09/26/2024   Return in 1 year (on 09/26/2025).  After Visit Summary: (MyChart) Due to this being a telephonic visit, the after visit summary with patients personalized plan was offered to patient via MyChart   Nurse Notes: Appointment(s) made: (scheduled 2027 AWV appt)  "

## 2024-09-27 ENCOUNTER — Other Ambulatory Visit: Payer: Self-pay

## 2024-10-06 ENCOUNTER — Other Ambulatory Visit: Payer: Self-pay | Admitting: Oncology

## 2024-10-09 ENCOUNTER — Encounter: Payer: Self-pay | Admitting: Oncology

## 2024-11-30 ENCOUNTER — Inpatient Hospital Stay: Admitting: Oncology

## 2024-11-30 ENCOUNTER — Inpatient Hospital Stay

## 2025-09-27 ENCOUNTER — Ambulatory Visit
